# Patient Record
Sex: Male | Born: 1935
Health system: Southern US, Community
[De-identification: ages and names within clinical notes are randomized; demographics above are authoritative.]

## PROBLEM LIST (undated history)

## (undated) DIAGNOSIS — R55 Syncope and collapse: Secondary | ICD-10-CM

## (undated) DIAGNOSIS — E785 Hyperlipidemia, unspecified: Secondary | ICD-10-CM

## (undated) DIAGNOSIS — Z9289 Personal history of other medical treatment: Secondary | ICD-10-CM

## (undated) DIAGNOSIS — Z95810 Presence of automatic (implantable) cardiac defibrillator: Secondary | ICD-10-CM

## (undated) DIAGNOSIS — G4733 Obstructive sleep apnea (adult) (pediatric): Secondary | ICD-10-CM

## (undated) DIAGNOSIS — I5022 Chronic systolic (congestive) heart failure: Secondary | ICD-10-CM

## (undated) DIAGNOSIS — Z860100 Personal history of colon polyps, unspecified: Secondary | ICD-10-CM

## (undated) DIAGNOSIS — M1990 Unspecified osteoarthritis, unspecified site: Secondary | ICD-10-CM

## (undated) DIAGNOSIS — I499 Cardiac arrhythmia, unspecified: Secondary | ICD-10-CM

## (undated) DIAGNOSIS — I340 Nonrheumatic mitral (valve) insufficiency: Secondary | ICD-10-CM

## (undated) DIAGNOSIS — R200 Anesthesia of skin: Secondary | ICD-10-CM

## (undated) DIAGNOSIS — I4589 Other specified conduction disorders: Secondary | ICD-10-CM

## (undated) DIAGNOSIS — G43909 Migraine, unspecified, not intractable, without status migrainosus: Secondary | ICD-10-CM

## (undated) DIAGNOSIS — R202 Paresthesia of skin: Secondary | ICD-10-CM

## (undated) DIAGNOSIS — I219 Acute myocardial infarction, unspecified: Secondary | ICD-10-CM

## (undated) DIAGNOSIS — I2510 Atherosclerotic heart disease of native coronary artery without angina pectoris: Secondary | ICD-10-CM

## (undated) DIAGNOSIS — I10 Essential (primary) hypertension: Secondary | ICD-10-CM

## (undated) HISTORY — PX: CORONARY ANGIOPLASTY WITH STENT PLACEMENT: SHX49

## (undated) HISTORY — DX: Nonrheumatic mitral (valve) insufficiency: I34.0

## (undated) HISTORY — DX: Atherosclerotic heart disease of native coronary artery without angina pectoris: I25.10

## (undated) HISTORY — DX: Cardiac arrhythmia, unspecified: I49.9

## (undated) HISTORY — DX: Paresthesia of skin: R20.0

## (undated) HISTORY — DX: Chronic systolic (congestive) heart failure: I50.22

## (undated) HISTORY — DX: Personal history of colon polyps, unspecified: Z86.0100

## (undated) HISTORY — DX: Hyperlipidemia, unspecified: E78.5

## (undated) HISTORY — PX: CYSTOSCOPY: SHX5120

## (undated) HISTORY — DX: Essential (primary) hypertension: I10

## (undated) HISTORY — DX: Paresthesia of skin: R20.2

## (undated) HISTORY — PX: BACK SURGERY: SHX140

## (undated) HISTORY — PX: POSTERIOR LUMBAR FUSION: SHX6036

## (undated) HISTORY — DX: Syncope and collapse: R55

## (undated) HISTORY — PX: CATARACT EXTRACTION W/ INTRAOCULAR LENS  IMPLANT, BILATERAL: SHX1307

## (undated) HISTORY — PX: CARDIAC CATHETERIZATION: SHX172

## (undated) HISTORY — DX: Other specified conduction disorders: I45.89

## (undated) HISTORY — DX: Personal history of colonic polyps: Z86.010

## (undated) HISTORY — DX: Unspecified osteoarthritis, unspecified site: M19.90

## (undated) HISTORY — DX: Acute myocardial infarction, unspecified: I21.9

## (undated) HISTORY — DX: Obstructive sleep apnea (adult) (pediatric): G47.33

---
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
## 1967-09-16 DIAGNOSIS — Z9289 Personal history of other medical treatment: Secondary | ICD-10-CM

## 1967-09-16 HISTORY — DX: Personal history of other medical treatment: Z92.89

## 1990-09-15 HISTORY — PX: CORONARY ARTERY BYPASS GRAFT: SHX141

## 2000-08-25 ENCOUNTER — Ambulatory Visit (HOSPITAL_COMMUNITY): Admission: RE | Admit: 2000-08-25 | Discharge: 2000-08-25 | Payer: Self-pay | Admitting: Interventional Cardiology

## 2004-11-17 ENCOUNTER — Emergency Department (HOSPITAL_COMMUNITY): Admission: EM | Admit: 2004-11-17 | Discharge: 2004-11-17 | Payer: Self-pay | Admitting: Emergency Medicine

## 2004-12-25 ENCOUNTER — Ambulatory Visit: Payer: Self-pay | Admitting: Internal Medicine

## 2004-12-30 ENCOUNTER — Ambulatory Visit: Payer: Self-pay | Admitting: Internal Medicine

## 2004-12-31 ENCOUNTER — Ambulatory Visit: Payer: Self-pay | Admitting: Cardiology

## 2005-02-06 ENCOUNTER — Ambulatory Visit: Payer: Self-pay | Admitting: Internal Medicine

## 2005-02-13 ENCOUNTER — Encounter (INDEPENDENT_AMBULATORY_CARE_PROVIDER_SITE_OTHER): Payer: Self-pay | Admitting: Specialist

## 2005-02-13 ENCOUNTER — Ambulatory Visit: Payer: Self-pay | Admitting: Internal Medicine

## 2005-05-16 ENCOUNTER — Ambulatory Visit: Payer: Self-pay | Admitting: Cardiology

## 2005-05-21 ENCOUNTER — Ambulatory Visit: Payer: Self-pay | Admitting: Cardiology

## 2005-05-21 ENCOUNTER — Ambulatory Visit (HOSPITAL_COMMUNITY): Admission: RE | Admit: 2005-05-21 | Discharge: 2005-05-21 | Payer: Self-pay | Admitting: Cardiology

## 2005-06-18 ENCOUNTER — Ambulatory Visit: Payer: Self-pay | Admitting: Cardiology

## 2005-07-08 ENCOUNTER — Ambulatory Visit: Payer: Self-pay | Admitting: Internal Medicine

## 2005-07-25 ENCOUNTER — Ambulatory Visit: Payer: Self-pay | Admitting: Cardiology

## 2006-01-21 ENCOUNTER — Ambulatory Visit: Payer: Self-pay | Admitting: Cardiology

## 2006-02-12 ENCOUNTER — Ambulatory Visit: Payer: Self-pay | Admitting: Cardiology

## 2006-02-13 ENCOUNTER — Ambulatory Visit: Payer: Self-pay

## 2006-02-25 ENCOUNTER — Ambulatory Visit: Payer: Self-pay | Admitting: Cardiovascular Disease

## 2006-03-03 ENCOUNTER — Ambulatory Visit: Payer: Self-pay | Admitting: Cardiology

## 2006-07-28 ENCOUNTER — Ambulatory Visit: Payer: Self-pay | Admitting: Internal Medicine

## 2006-09-15 HISTORY — PX: CARDIAC DEFIBRILLATOR PLACEMENT: SHX171

## 2006-09-23 ENCOUNTER — Ambulatory Visit: Payer: Self-pay | Admitting: Cardiology

## 2006-09-23 ENCOUNTER — Ambulatory Visit: Payer: Self-pay

## 2006-12-30 ENCOUNTER — Ambulatory Visit: Payer: Self-pay | Admitting: Cardiology

## 2006-12-30 LAB — CONVERTED CEMR LAB
ALT: 21 units/L (ref 0–40)
Bilirubin, Direct: 0.1 mg/dL (ref 0.0–0.3)
Calcium: 9.3 mg/dL (ref 8.4–10.5)
GFR calc Af Amer: 69 mL/min
GFR calc non Af Amer: 57 mL/min
Glucose, Bld: 89 mg/dL (ref 70–99)
TSH: 2.56 microintl units/mL (ref 0.35–5.50)

## 2007-02-04 ENCOUNTER — Ambulatory Visit: Payer: Self-pay | Admitting: Cardiology

## 2007-02-12 ENCOUNTER — Ambulatory Visit: Payer: Self-pay | Admitting: Internal Medicine

## 2007-02-12 ENCOUNTER — Ambulatory Visit: Payer: Self-pay | Admitting: Cardiology

## 2007-02-12 LAB — CONVERTED CEMR LAB
Calcium: 9.5 mg/dL (ref 8.4–10.5)
Chloride: 106 meq/L (ref 96–112)
Digitoxin Lvl: 0.6 ng/mL — ABNORMAL LOW (ref 0.8–2.0)
GFR calc non Af Amer: 57 mL/min
Glucose, Bld: 94 mg/dL (ref 70–99)

## 2007-02-17 ENCOUNTER — Ambulatory Visit: Payer: Self-pay | Admitting: Cardiology

## 2007-02-23 ENCOUNTER — Ambulatory Visit: Payer: Self-pay | Admitting: Cardiology

## 2007-02-23 LAB — CONVERTED CEMR LAB
Basophils Relative: 0.1 % (ref 0.0–1.0)
CO2: 25 meq/L (ref 19–32)
Calcium: 9 mg/dL (ref 8.4–10.5)
Chloride: 101 meq/L (ref 96–112)
Eosinophils Relative: 2 % (ref 0.0–5.0)
GFR calc non Af Amer: 53 mL/min
Glucose, Bld: 185 mg/dL — ABNORMAL HIGH (ref 70–99)
Platelets: 184 10*3/uL (ref 150–400)
Prothrombin Time: 11.3 s (ref 10.0–14.0)
RBC: 4.11 M/uL — ABNORMAL LOW (ref 4.22–5.81)
WBC: 6.5 10*3/uL (ref 4.5–10.5)

## 2007-02-26 ENCOUNTER — Inpatient Hospital Stay (HOSPITAL_BASED_OUTPATIENT_CLINIC_OR_DEPARTMENT_OTHER): Admission: RE | Admit: 2007-02-26 | Discharge: 2007-02-26 | Payer: Self-pay | Admitting: Internal Medicine

## 2007-02-26 ENCOUNTER — Ambulatory Visit: Payer: Self-pay | Admitting: Internal Medicine

## 2007-03-02 ENCOUNTER — Ambulatory Visit: Payer: Self-pay | Admitting: Internal Medicine

## 2007-03-02 LAB — CONVERTED CEMR LAB
Basophils Relative: 0 % (ref 0.0–1.0)
CO2: 25 meq/L (ref 19–32)
Calcium: 9 mg/dL (ref 8.4–10.5)
Creatinine, Ser: 1.5 mg/dL (ref 0.4–1.5)
Eosinophils Relative: 2.5 % (ref 0.0–5.0)
GFR calc Af Amer: 59 mL/min
Glucose, Bld: 164 mg/dL — ABNORMAL HIGH (ref 70–99)
Hemoglobin: 12.2 g/dL — ABNORMAL LOW (ref 13.0–17.0)
Lymphocytes Relative: 16.3 % (ref 12.0–46.0)
Neutro Abs: 4.8 10*3/uL (ref 1.4–7.7)
Platelets: 209 10*3/uL (ref 150–400)
Prothrombin Time: 11.4 s (ref 10.0–14.0)
RDW: 14.2 % (ref 11.5–14.6)
WBC: 6.3 10*3/uL (ref 4.5–10.5)

## 2007-03-04 ENCOUNTER — Ambulatory Visit: Admission: RE | Admit: 2007-03-04 | Discharge: 2007-03-04 | Payer: Self-pay | Admitting: Internal Medicine

## 2007-03-10 ENCOUNTER — Ambulatory Visit: Payer: Self-pay | Admitting: Internal Medicine

## 2007-03-10 ENCOUNTER — Ambulatory Visit (HOSPITAL_COMMUNITY): Admission: RE | Admit: 2007-03-10 | Discharge: 2007-03-10 | Payer: Self-pay | Admitting: Internal Medicine

## 2007-03-16 DIAGNOSIS — Z95810 Presence of automatic (implantable) cardiac defibrillator: Secondary | ICD-10-CM

## 2007-03-24 ENCOUNTER — Ambulatory Visit: Payer: Self-pay | Admitting: Cardiology

## 2007-03-24 LAB — CONVERTED CEMR LAB
Basophils Relative: 0.1 % (ref 0.0–1.0)
CO2: 26 meq/L (ref 19–32)
Creatinine, Ser: 1.5 mg/dL (ref 0.4–1.5)
Eosinophils Relative: 2.7 % (ref 0.0–5.0)
GFR calc Af Amer: 59 mL/min
Glucose, Bld: 141 mg/dL — ABNORMAL HIGH (ref 70–99)
HCT: 35.3 % — ABNORMAL LOW (ref 39.0–52.0)
Hemoglobin: 12.3 g/dL — ABNORMAL LOW (ref 13.0–17.0)
Lymphocytes Relative: 17.3 % (ref 12.0–46.0)
Monocytes Absolute: 0.6 10*3/uL (ref 0.2–0.7)
Monocytes Relative: 8.7 % (ref 3.0–11.0)
Neutro Abs: 4.9 10*3/uL (ref 1.4–7.7)
Neutrophils Relative %: 71.2 % (ref 43.0–77.0)
Potassium: 4.4 meq/L (ref 3.5–5.1)
Prothrombin Time: 11.1 s (ref 10.0–14.0)
WBC: 6.9 10*3/uL (ref 4.5–10.5)

## 2007-03-26 ENCOUNTER — Ambulatory Visit (HOSPITAL_COMMUNITY): Admission: RE | Admit: 2007-03-26 | Discharge: 2007-03-27 | Payer: Self-pay | Admitting: Internal Medicine

## 2007-03-26 ENCOUNTER — Ambulatory Visit: Payer: Self-pay | Admitting: Internal Medicine

## 2007-04-14 ENCOUNTER — Ambulatory Visit: Payer: Self-pay

## 2007-05-03 DIAGNOSIS — Z860100 Personal history of colon polyps, unspecified: Secondary | ICD-10-CM | POA: Insufficient documentation

## 2007-05-03 DIAGNOSIS — I119 Hypertensive heart disease without heart failure: Secondary | ICD-10-CM

## 2007-05-03 DIAGNOSIS — E785 Hyperlipidemia, unspecified: Secondary | ICD-10-CM

## 2007-06-30 ENCOUNTER — Ambulatory Visit: Payer: Self-pay | Admitting: Internal Medicine

## 2007-06-30 LAB — CONVERTED CEMR LAB
Basophils Absolute: 0 10*3/uL (ref 0.0–0.1)
Calcium: 9.4 mg/dL (ref 8.4–10.5)
Chloride: 106 meq/L (ref 96–112)
Eosinophils Absolute: 0.1 10*3/uL (ref 0.0–0.6)
GFR calc Af Amer: 59 mL/min
GFR calc non Af Amer: 48 mL/min
Glucose, Bld: 106 mg/dL — ABNORMAL HIGH (ref 70–99)
Lymphocytes Relative: 18.4 % (ref 12.0–46.0)
MCHC: 33.8 g/dL (ref 30.0–36.0)
MCV: 87 fL (ref 78.0–100.0)
Neutro Abs: 5.2 10*3/uL (ref 1.4–7.7)
Neutrophils Relative %: 72.7 % (ref 43.0–77.0)
Platelets: 218 10*3/uL (ref 150–400)
RBC: 3.79 M/uL — ABNORMAL LOW (ref 4.22–5.81)
Sodium: 138 meq/L (ref 135–145)

## 2007-07-02 ENCOUNTER — Ambulatory Visit: Payer: Self-pay | Admitting: Cardiology

## 2007-07-06 ENCOUNTER — Ambulatory Visit: Payer: Self-pay | Admitting: Internal Medicine

## 2007-07-22 IMAGING — CR DG CHEST 2V
2 series · 2 of 2 positions shown · non-contrast
Comparison: 11/17/04.

CLINICAL DATA: Pre-ICD lead placement.
 CHEST - 2 VIEW:

[view not recorded (1 of 2)]
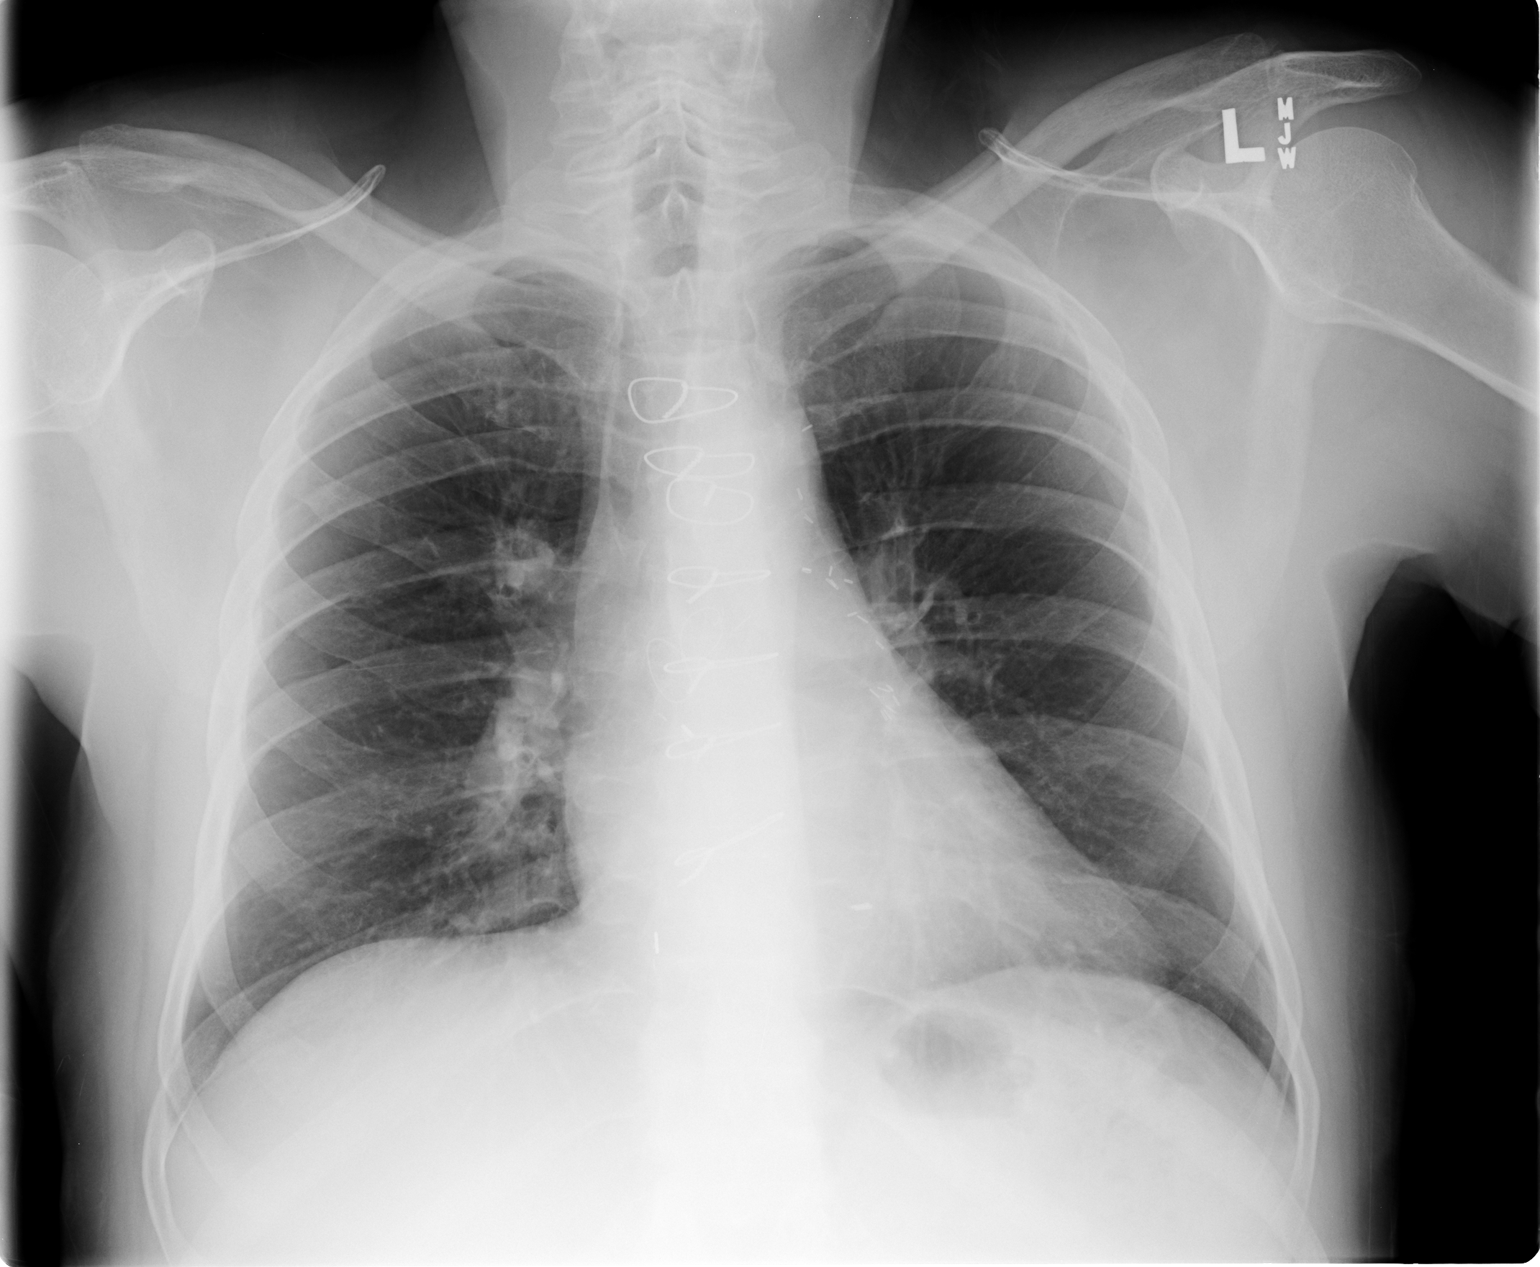

[view not recorded (2 of 2)]
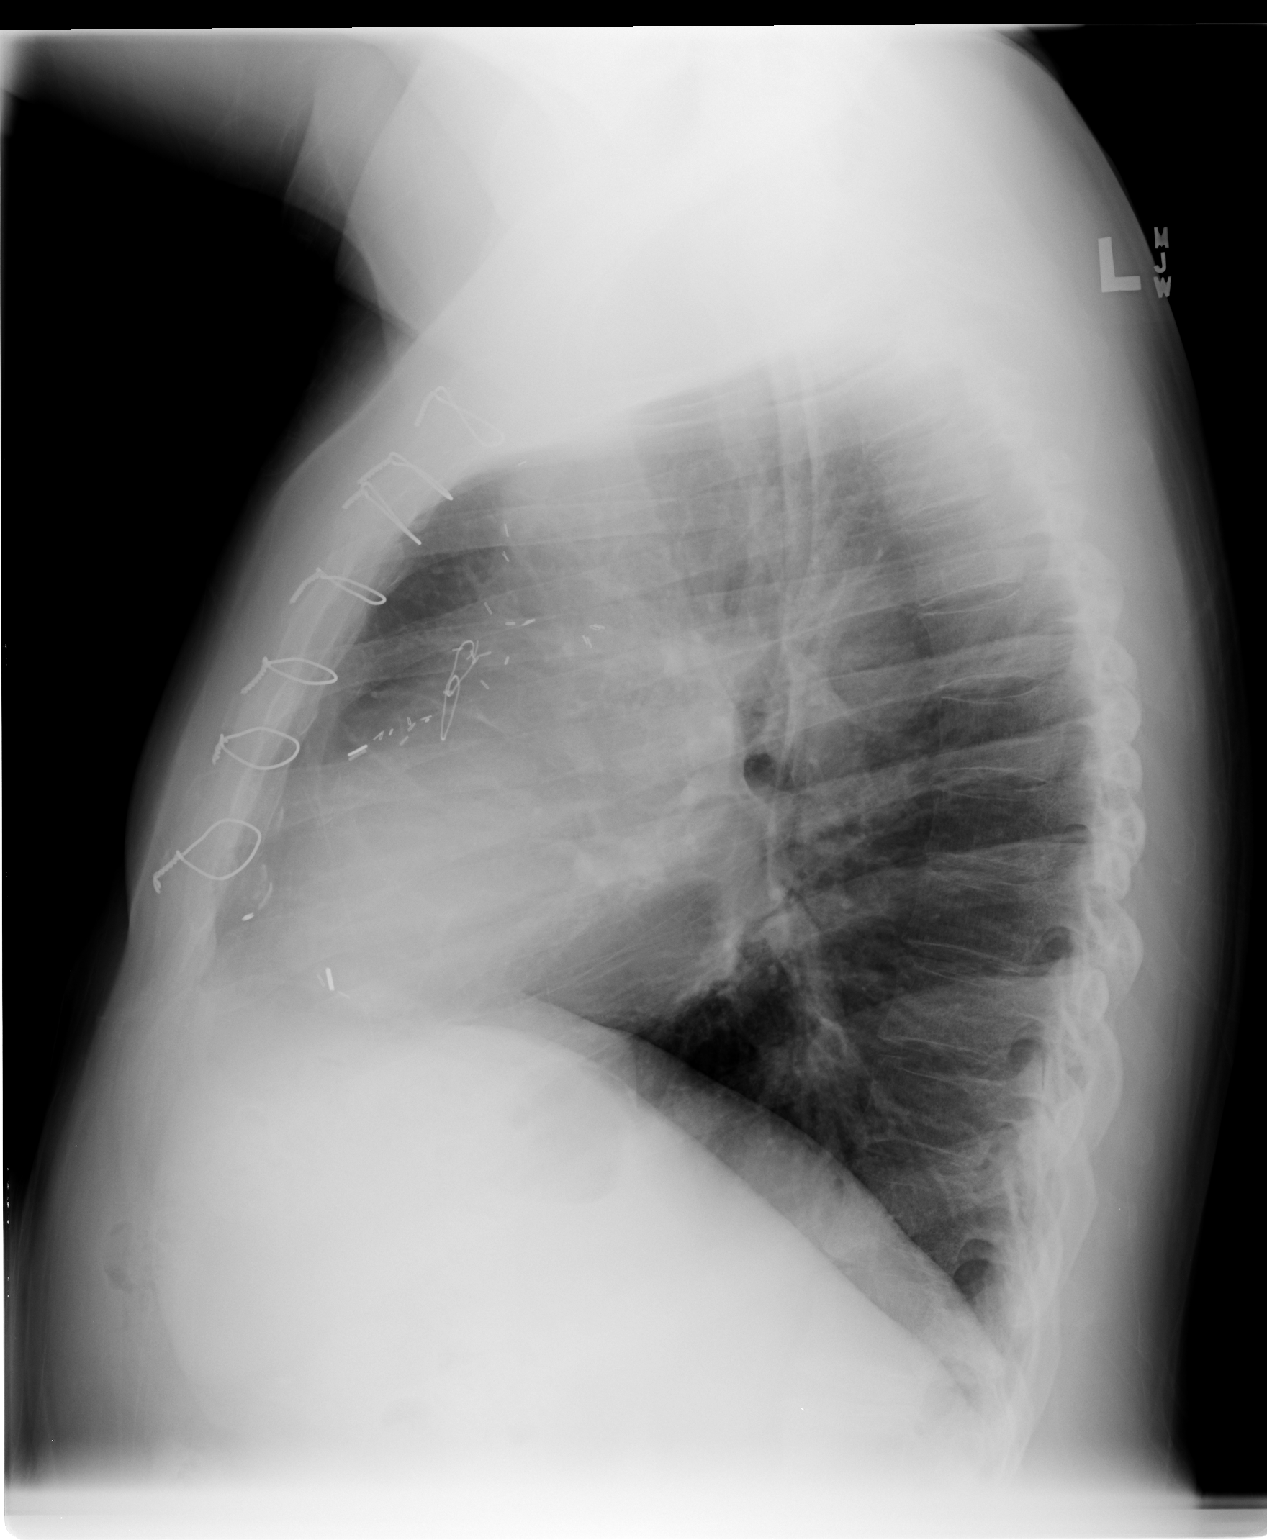

[2 of 2 positions shown; findings below may reference images not displayed]

FINDINGS: Two views of the chest show the lungs to be clear. There is mild peribronchial thickening present.  Mild cardiomegaly is stable. Median sternotomy sutures are noted.
IMPRESSION: No active lung disease. Borderline cardiomegaly. Mild peribronchial thickening.

## 2007-07-26 ENCOUNTER — Ambulatory Visit: Payer: Self-pay | Admitting: Internal Medicine

## 2007-08-19 ENCOUNTER — Ambulatory Visit: Payer: Self-pay | Admitting: Internal Medicine

## 2007-10-06 ENCOUNTER — Ambulatory Visit: Payer: Self-pay | Admitting: Cardiology

## 2007-10-18 ENCOUNTER — Ambulatory Visit: Payer: Self-pay | Admitting: Internal Medicine

## 2008-01-17 ENCOUNTER — Ambulatory Visit: Payer: Self-pay | Admitting: Internal Medicine

## 2008-02-14 ENCOUNTER — Ambulatory Visit: Payer: Self-pay | Admitting: Internal Medicine

## 2008-02-16 LAB — CONVERTED CEMR LAB
AST: 17 units/L (ref 0–37)
Albumin: 3.7 g/dL (ref 3.5–5.2)
BUN: 28 mg/dL — ABNORMAL HIGH (ref 6–23)
Basophils Absolute: 0 10*3/uL (ref 0.0–0.1)
Basophils Relative: 0.1 % (ref 0.0–1.0)
Calcium: 9.3 mg/dL (ref 8.4–10.5)
Chloride: 106 meq/L (ref 96–112)
Cholesterol: 131 mg/dL (ref 0–200)
Creatinine, Ser: 1.7 mg/dL — ABNORMAL HIGH (ref 0.4–1.5)
Direct LDL: 46.8 mg/dL
Eosinophils Absolute: 0.1 10*3/uL (ref 0.0–0.7)
Eosinophils Relative: 2 % (ref 0.0–5.0)
GFR calc Af Amer: 51 mL/min
GFR calc non Af Amer: 42 mL/min
HCT: 34.1 % — ABNORMAL LOW (ref 39.0–52.0)
Hemoglobin: 11.4 g/dL — ABNORMAL LOW (ref 13.0–17.0)
Ketones, ur: NEGATIVE mg/dL
MCHC: 33.6 g/dL (ref 30.0–36.0)
MCV: 88 fL (ref 78.0–100.0)
Monocytes Absolute: 0.5 10*3/uL (ref 0.1–1.0)
Neutro Abs: 5.3 10*3/uL (ref 1.4–7.7)
PSA: 1.42 ng/mL (ref 0.10–4.00)
RBC: 3.87 M/uL — ABNORMAL LOW (ref 4.22–5.81)
Specific Gravity, Urine: 1.01 (ref 1.000–1.03)
Total Bilirubin: 0.9 mg/dL (ref 0.3–1.2)
Total Protein, Urine: NEGATIVE mg/dL
Urine Glucose: NEGATIVE mg/dL
Urobilinogen, UA: 0.2 (ref 0.0–1.0)
VLDL: 60 mg/dL — ABNORMAL HIGH (ref 0–40)
WBC: 7.1 10*3/uL (ref 4.5–10.5)

## 2008-02-18 ENCOUNTER — Ambulatory Visit: Payer: Self-pay | Admitting: Internal Medicine

## 2008-02-18 DIAGNOSIS — D638 Anemia in other chronic diseases classified elsewhere: Secondary | ICD-10-CM

## 2008-03-16 ENCOUNTER — Ambulatory Visit: Payer: Self-pay | Admitting: Cardiology

## 2008-04-17 ENCOUNTER — Ambulatory Visit: Payer: Self-pay | Admitting: Internal Medicine

## 2008-05-24 ENCOUNTER — Ambulatory Visit: Payer: Self-pay | Admitting: Cardiology

## 2008-05-29 ENCOUNTER — Ambulatory Visit: Payer: Self-pay

## 2008-05-29 ENCOUNTER — Encounter: Payer: Self-pay | Admitting: Cardiology

## 2008-05-30 ENCOUNTER — Ambulatory Visit: Payer: Self-pay | Admitting: Cardiology

## 2008-06-13 ENCOUNTER — Ambulatory Visit: Payer: Self-pay | Admitting: Cardiology

## 2008-06-20 ENCOUNTER — Ambulatory Visit: Payer: Self-pay | Admitting: Internal Medicine

## 2008-07-06 ENCOUNTER — Ambulatory Visit: Payer: Self-pay | Admitting: Cardiology

## 2008-08-17 ENCOUNTER — Ambulatory Visit: Payer: Self-pay | Admitting: Internal Medicine

## 2008-08-17 LAB — CONVERTED CEMR LAB
Basophils Absolute: 0.1 10*3/uL (ref 0.0–0.1)
CO2: 30 meq/L (ref 19–32)
Chloride: 106 meq/L (ref 96–112)
Eosinophils Absolute: 0.2 10*3/uL (ref 0.0–0.7)
Eosinophils Relative: 2.9 % (ref 0.0–5.0)
GFR calc non Af Amer: 63 mL/min
Iron: 57 ug/dL (ref 42–165)
Lymphocytes Relative: 15.4 % (ref 12.0–46.0)
MCHC: 34.1 g/dL (ref 30.0–36.0)
MCV: 85.7 fL (ref 78.0–100.0)
Neutrophils Relative %: 73.6 % (ref 43.0–77.0)
Platelets: 191 10*3/uL (ref 150–400)
Potassium: 4.3 meq/L (ref 3.5–5.1)
WBC: 6.3 10*3/uL (ref 4.5–10.5)

## 2008-08-22 ENCOUNTER — Ambulatory Visit: Payer: Self-pay | Admitting: Internal Medicine

## 2008-09-18 ENCOUNTER — Ambulatory Visit: Payer: Self-pay | Admitting: Internal Medicine

## 2008-09-22 ENCOUNTER — Encounter: Payer: Self-pay | Admitting: Internal Medicine

## 2008-10-05 ENCOUNTER — Ambulatory Visit: Payer: Self-pay | Admitting: Cardiology

## 2008-12-18 ENCOUNTER — Ambulatory Visit: Payer: Self-pay | Admitting: Internal Medicine

## 2009-02-21 ENCOUNTER — Ambulatory Visit: Payer: Self-pay | Admitting: Internal Medicine

## 2009-02-21 LAB — CONVERTED CEMR LAB
Albumin: 3.7 g/dL (ref 3.5–5.2)
Alkaline Phosphatase: 87 units/L (ref 39–117)
Basophils Absolute: 0 10*3/uL (ref 0.0–0.1)
Bilirubin, Direct: 0.2 mg/dL (ref 0.0–0.3)
Chloride: 112 meq/L (ref 96–112)
Cholesterol: 127 mg/dL (ref 0–200)
Eosinophils Absolute: 0.3 10*3/uL (ref 0.0–0.7)
HDL: 29.7 mg/dL — ABNORMAL LOW (ref >39.00)
LDL Cholesterol: 63 mg/dL (ref 0–99)
Lymphocytes Relative: 19.3 % (ref 12.0–46.0)
MCHC: 33.9 g/dL (ref 30.0–36.0)
Neutro Abs: 3.5 10*3/uL (ref 1.4–7.7)
Neutrophils Relative %: 68.2 % (ref 43.0–77.0)
Platelets: 184 10*3/uL (ref 150.0–400.0)
Potassium: 4.7 meq/L (ref 3.5–5.1)
RDW: 14.2 % (ref 11.5–14.6)
Triglycerides: 172 mg/dL — ABNORMAL HIGH (ref 0.0–149.0)
VLDL: 34.4 mg/dL (ref 0.0–40.0)

## 2009-02-28 ENCOUNTER — Ambulatory Visit: Payer: Self-pay | Admitting: Internal Medicine

## 2009-02-28 DIAGNOSIS — R05 Cough: Secondary | ICD-10-CM

## 2009-03-19 ENCOUNTER — Ambulatory Visit: Payer: Self-pay | Admitting: Internal Medicine

## 2009-04-02 ENCOUNTER — Encounter: Payer: Self-pay | Admitting: Internal Medicine

## 2009-05-04 ENCOUNTER — Telehealth: Payer: Self-pay | Admitting: Cardiology

## 2009-06-26 ENCOUNTER — Ambulatory Visit: Payer: Self-pay | Admitting: Internal Medicine

## 2009-07-09 ENCOUNTER — Telehealth (INDEPENDENT_AMBULATORY_CARE_PROVIDER_SITE_OTHER): Payer: Self-pay | Admitting: *Deleted

## 2009-07-10 ENCOUNTER — Ambulatory Visit: Payer: Self-pay | Admitting: Cardiology

## 2009-07-10 ENCOUNTER — Ambulatory Visit: Payer: Self-pay

## 2009-07-10 ENCOUNTER — Encounter (HOSPITAL_COMMUNITY): Admission: RE | Admit: 2009-07-10 | Discharge: 2009-09-13 | Payer: Self-pay | Admitting: Internal Medicine

## 2009-07-17 ENCOUNTER — Telehealth: Payer: Self-pay | Admitting: Internal Medicine

## 2009-08-21 ENCOUNTER — Ambulatory Visit: Payer: Self-pay | Admitting: Internal Medicine

## 2009-08-21 LAB — CONVERTED CEMR LAB
ALT: 29 units/L (ref 0–53)
AST: 20 units/L (ref 0–37)
BUN: 13 mg/dL (ref 6–23)
Basophils Relative: 1.4 % (ref 0.0–3.0)
Chloride: 105 meq/L (ref 96–112)
Cholesterol: 132 mg/dL (ref 0–200)
Eosinophils Relative: 3.3 % (ref 0.0–5.0)
Hemoglobin: 13.3 g/dL (ref 13.0–17.0)
Lymphocytes Relative: 18.6 % (ref 12.0–46.0)
Monocytes Relative: 7.4 % (ref 3.0–12.0)
Neutro Abs: 4.1 10*3/uL (ref 1.4–7.7)
Potassium: 4.3 meq/L (ref 3.5–5.1)
RBC: 4.52 M/uL (ref 4.22–5.81)
TSH: 2.05 microintl units/mL (ref 0.35–5.50)
Total Bilirubin: 0.8 mg/dL (ref 0.3–1.2)
Total CHOL/HDL Ratio: 4

## 2009-08-27 ENCOUNTER — Ambulatory Visit: Payer: Self-pay | Admitting: Internal Medicine

## 2009-08-27 DIAGNOSIS — L723 Sebaceous cyst: Secondary | ICD-10-CM

## 2009-10-09 ENCOUNTER — Telehealth (INDEPENDENT_AMBULATORY_CARE_PROVIDER_SITE_OTHER): Payer: Self-pay | Admitting: *Deleted

## 2009-10-21 ENCOUNTER — Encounter: Payer: Self-pay | Admitting: Cardiology

## 2009-10-22 ENCOUNTER — Ambulatory Visit: Payer: Self-pay | Admitting: Cardiology

## 2009-10-22 ENCOUNTER — Ambulatory Visit: Payer: Self-pay | Admitting: Internal Medicine

## 2009-10-22 ENCOUNTER — Encounter: Payer: Self-pay | Admitting: Internal Medicine

## 2009-10-22 DIAGNOSIS — R209 Unspecified disturbances of skin sensation: Secondary | ICD-10-CM

## 2009-10-22 DIAGNOSIS — M79602 Pain in left arm: Secondary | ICD-10-CM | POA: Insufficient documentation

## 2009-10-22 DIAGNOSIS — Z87891 Personal history of nicotine dependence: Secondary | ICD-10-CM

## 2009-11-14 ENCOUNTER — Ambulatory Visit: Payer: Self-pay | Admitting: Internal Medicine

## 2009-11-14 LAB — CONVERTED CEMR LAB
CO2: 30 meq/L (ref 19–32)
Glucose, Bld: 132 mg/dL — ABNORMAL HIGH (ref 70–99)
HDL: 36.7 mg/dL — ABNORMAL LOW (ref >39.00)
Potassium: 4.1 meq/L (ref 3.5–5.1)
Sodium: 141 meq/L (ref 135–145)
TSH: 2.19 microintl units/mL (ref 0.35–5.50)
Total CK: 79 units/L (ref 7–232)
VLDL: 75.4 mg/dL — ABNORMAL HIGH (ref 0.0–40.0)

## 2009-11-19 ENCOUNTER — Ambulatory Visit: Payer: Self-pay | Admitting: Internal Medicine

## 2009-11-19 DIAGNOSIS — J209 Acute bronchitis, unspecified: Secondary | ICD-10-CM

## 2010-02-13 ENCOUNTER — Ambulatory Visit: Payer: Self-pay | Admitting: Internal Medicine

## 2010-02-13 LAB — CONVERTED CEMR LAB
AST: 20 units/L (ref 0–37)
Albumin: 3.9 g/dL (ref 3.5–5.2)
BUN: 18 mg/dL (ref 6–23)
CO2: 30 meq/L (ref 19–32)
Calcium: 9 mg/dL (ref 8.4–10.5)
Cholesterol: 127 mg/dL (ref 0–200)
GFR calc non Af Amer: 57.19 mL/min (ref >60)
Glucose, Bld: 99 mg/dL (ref 70–99)
Potassium: 4.6 meq/L (ref 3.5–5.1)
Total Protein: 6.8 g/dL (ref 6.0–8.3)
VLDL: 35.8 mg/dL (ref 0.0–40.0)

## 2010-02-19 ENCOUNTER — Ambulatory Visit: Payer: Self-pay | Admitting: Internal Medicine

## 2010-02-27 ENCOUNTER — Encounter (INDEPENDENT_AMBULATORY_CARE_PROVIDER_SITE_OTHER): Payer: Self-pay | Admitting: *Deleted

## 2010-04-25 ENCOUNTER — Ambulatory Visit: Payer: Self-pay | Admitting: Cardiology

## 2010-04-25 ENCOUNTER — Encounter: Payer: Self-pay | Admitting: Internal Medicine

## 2010-06-14 ENCOUNTER — Ambulatory Visit: Payer: Self-pay | Admitting: Internal Medicine

## 2010-06-14 LAB — CONVERTED CEMR LAB
CO2: 28 meq/L (ref 19–32)
Calcium: 8.6 mg/dL (ref 8.4–10.5)
Chloride: 103 meq/L (ref 96–112)
Glucose, Bld: 101 mg/dL — ABNORMAL HIGH (ref 70–99)
Sodium: 139 meq/L (ref 135–145)

## 2010-06-21 ENCOUNTER — Ambulatory Visit: Payer: Self-pay | Admitting: Internal Medicine

## 2010-07-16 ENCOUNTER — Ambulatory Visit: Payer: Self-pay | Admitting: Internal Medicine

## 2010-09-24 ENCOUNTER — Other Ambulatory Visit: Payer: Self-pay | Admitting: Internal Medicine

## 2010-09-24 ENCOUNTER — Ambulatory Visit
Admission: RE | Admit: 2010-09-24 | Discharge: 2010-09-24 | Payer: Self-pay | Source: Home / Self Care | Attending: Internal Medicine | Admitting: Internal Medicine

## 2010-09-24 LAB — CBC WITH DIFFERENTIAL/PLATELET
Basophils Absolute: 0 10*3/uL (ref 0.0–0.1)
Basophils Relative: 0.8 % (ref 0.0–3.0)
Eosinophils Absolute: 0.3 10*3/uL (ref 0.0–0.7)
Eosinophils Relative: 4.2 % (ref 0.0–5.0)
HCT: 38.5 % — ABNORMAL LOW (ref 39.0–52.0)
Hemoglobin: 13 g/dL (ref 13.0–17.0)
Lymphocytes Relative: 15.9 % (ref 12.0–46.0)
Lymphs Abs: 1 10*3/uL (ref 0.7–4.0)
MCHC: 33.7 g/dL (ref 30.0–36.0)
MCV: 86.8 fl (ref 78.0–100.0)
Monocytes Absolute: 0.4 10*3/uL (ref 0.1–1.0)
Monocytes Relative: 6.5 % (ref 3.0–12.0)
Neutro Abs: 4.7 10*3/uL (ref 1.4–7.7)
Neutrophils Relative %: 72.6 % (ref 43.0–77.0)
Platelets: 194 10*3/uL (ref 150.0–400.0)
RBC: 4.44 Mil/uL (ref 4.22–5.81)
RDW: 14.9 % — ABNORMAL HIGH (ref 11.5–14.6)
WBC: 6.4 10*3/uL (ref 4.5–10.5)

## 2010-09-24 LAB — URINALYSIS
Bilirubin Urine: NEGATIVE
Hemoglobin, Urine: NEGATIVE
Ketones, ur: NEGATIVE
Leukocytes, UA: NEGATIVE
Nitrite: NEGATIVE
Specific Gravity, Urine: 1.02 (ref 1.000–1.030)
Total Protein, Urine: NEGATIVE
Urine Glucose: NEGATIVE
Urobilinogen, UA: 0.2 (ref 0.0–1.0)
pH: 6 (ref 5.0–8.0)

## 2010-09-24 LAB — BASIC METABOLIC PANEL
BUN: 14 mg/dL (ref 6–23)
CO2: 28 mEq/L (ref 19–32)
Calcium: 9 mg/dL (ref 8.4–10.5)
Chloride: 106 mEq/L (ref 96–112)
Creatinine, Ser: 1 mg/dL (ref 0.4–1.5)
GFR: 73.21 mL/min (ref >60.00)
Glucose, Bld: 114 mg/dL — ABNORMAL HIGH (ref 70–99)
Potassium: 4.6 mEq/L (ref 3.5–5.1)
Sodium: 141 mEq/L (ref 135–145)

## 2010-09-24 LAB — HEPATIC FUNCTION PANEL
ALT: 23 U/L (ref 0–53)
AST: 17 U/L (ref 0–37)
Albumin: 3.6 g/dL (ref 3.5–5.2)
Alkaline Phosphatase: 82 U/L (ref 39–117)
Bilirubin, Direct: 0.1 mg/dL (ref 0.0–0.3)
Total Bilirubin: 0.6 mg/dL (ref 0.3–1.2)
Total Protein: 6.4 g/dL (ref 6.0–8.3)

## 2010-09-24 LAB — LIPID PANEL
Cholesterol: 135 mg/dL (ref 0–200)
HDL: 30.8 mg/dL — ABNORMAL LOW (ref >39.00)
LDL Cholesterol: 65 mg/dL (ref 0–99)
Total CHOL/HDL Ratio: 4
Triglycerides: 197 mg/dL — ABNORMAL HIGH (ref 0.0–149.0)
VLDL: 39.4 mg/dL (ref 0.0–40.0)

## 2010-09-24 LAB — PSA: PSA: 1.83 ng/mL (ref 0.10–4.00)

## 2010-09-24 LAB — TSH: TSH: 2.71 u[IU]/mL (ref 0.35–5.50)

## 2010-09-25 ENCOUNTER — Ambulatory Visit
Admission: RE | Admit: 2010-09-25 | Discharge: 2010-09-25 | Payer: Self-pay | Source: Home / Self Care | Attending: Internal Medicine | Admitting: Internal Medicine

## 2010-10-15 NOTE — Assessment & Plan Note (Signed)
Summary: 4 MO ROV /NWS  #   Vital Signs:  Patient profile:   77 year old male Height:      67 inches Weight:      157 pounds BMI:     24.68 Temp:     97.8 degrees F oral Pulse rate:   85 / minute Pulse rhythm:   regular BP sitting:   122 / 78  (left arm) Cuff size:   regular  Vitals Entered By: Lanier Prude, CMA(AAMA) (June 21, 2010 7:58 AM) CC: 4 mo f/u Is Patient Diabetic? No   Primary Care Provider:  Plotnikov  CC:  4 mo f/u.  History of Present Illness: The patient presents for a follow up of hypertension, CAD, hyperlipidemia   Preventive Screening-Counseling & Management  Alcohol-Tobacco     Smoking Status: quit  Current Medications (verified): 1)  Lipitor 40 Mg  Tabs (Atorvastatin Calcium) .... Once Daily 2)  Carvedilol 25 Mg  Tabs (Carvedilol) .... Two Times A Day 3)  Digitek 0.125 Mg Tabs (Digoxin) .Marland Kitchen.. 1 By Mouth Qd 4)  Vitamin D3 1000 Unit  Tabs (Cholecalciferol) .Marland Kitchen.. 1 Qd 5)  Aspirin 325 Mg Tabs (Aspirin) .Marland Kitchen.. 1 Qd 6)  Nitrolingual Duo Pack 0.4 Mg/spray Tl Soln (Nitroglycerin) .... Use Prn 7)  Ramipril 2.5 Mg Caps (Ramipril) .... Take One Capsule By Mouth Daily  Allergies (verified): 1)  ! Morphine 2)  Ramipril (Ramipril)  Past History:  Past Medical History: Last updated: 04/25/2010 Colonic polyps, hx of Hyperlipidemia Hypertension CAD.Marland Kitchen.(hx apical aneurysm).Marland Kitchen  /  cath..9/ 2006..old occluded graft to RCA....no change  /   cath...6/ 2008...no change..  /    ..myoview...10/ 2010...old scar...no ischemia...EF 43% EF 35-40%...echo...9/ 2009...akinesis periapical wall CABG...1992 ICD   7/ 2008 Chronotropic imcompetence....treated with pacemaker (ICD  03/2007)  (This was placed after CPX done post 2008 cath. CPX showed only chronotropic incompetence) Arrhythmia Congestive heart failure Syncope / dizziness...relative hypotension Mitral Regurg...mild Tingling in the left arm.... February, 2011 /   improved after treatment with  prednisone spironolactone.... mild dizziness while he was taking.. 2010.Marland Kitchen..Marland Kitchennot clear if related to medicine... retrial can be considered in the future  Past Surgical History: Last updated: 08/19/2007 Coronary artery bypass graft 1992 Cardiac Cath. Implanted Defibrillator 2008  Family History: Last updated: 08/19/2007 Family History of Colon CA 1st degree relative <60  S Family History Hypertension  Social History: Retired Married wife has met Ca 8/11 Former Smoker  Review of Systems  The patient denies weight loss, chest pain, prolonged cough, and abdominal pain.    Physical Exam  General:  The patient was alert and oriented in no acute distress. Neck veins were flat, carotids were brisk.  Lungs were clear.  Heart sounds were regular without murmurs or gallops.  Abdomen was soft with active bowel sounds. There is no clubbing cyanosis or edema. Skin Warm and dry  Psych:  Oriented X3 and normally interactive.     Impression & Recommendations:  Problem # 1:  CORONARY ARTERY DISEASE (ICD-414.00) Assessment Unchanged  His updated medication list for this problem includes:    Carvedilol 25 Mg Tabs (Carvedilol) .Marland Kitchen..Marland Kitchen Two times a day    Aspirin 325 Mg Tabs (Aspirin) .Marland Kitchen... 1 qd    Nitrolingual Duo Pack 0.4 Mg/spray Tl Soln (Nitroglycerin) ..... Use prn    Ramipril 2.5 Mg Caps (Ramipril) .Marland Kitchen... Take one capsule by mouth daily  Problem # 2:  HYPERLIPIDEMIA (ICD-272.4)  His updated medication list for this problem includes:    Lipitor  40 Mg Tabs (Atorvastatin calcium) ..... Once daily  Problem # 3:  CONGESTIVE HEART FAILURE (ICD-428.0)  His updated medication list for this problem includes:    Carvedilol 25 Mg Tabs (Carvedilol) .Marland Kitchen..Marland Kitchen Two times a day    Digitek 0.125 Mg Tabs (Digoxin) .Marland Kitchen... 1 by mouth qd    Aspirin 325 Mg Tabs (Aspirin) .Marland Kitchen... 1 qd    Ramipril 2.5 Mg Caps (Ramipril) .Marland Kitchen... Take one capsule by mouth daily  Problem # 4:  HYPERTENSION  (ICD-401.9) Assessment: Unchanged  His updated medication list for this problem includes:    Carvedilol 25 Mg Tabs (Carvedilol) .Marland Kitchen..Marland Kitchen Two times a day    Ramipril 2.5 Mg Caps (Ramipril) .Marland Kitchen... Take one capsule by mouth daily The labs were reviewed with the patient.  BP today: 122/78 Prior BP: 122/58 (04/25/2010)  Labs Reviewed: K+: 4.2 (06/14/2010) Creat: : 1.2 (06/14/2010)   Chol: 127 (02/13/2010)   HDL: 31.40 (02/13/2010)   LDL: 60 (02/13/2010)   TG: 179.0 (02/13/2010)  Complete Medication List: 1)  Lipitor 40 Mg Tabs (Atorvastatin calcium) .... Once daily 2)  Carvedilol 25 Mg Tabs (Carvedilol) .... Two times a day 3)  Digitek 0.125 Mg Tabs (Digoxin) .Marland Kitchen.. 1 by mouth qd 4)  Vitamin D3 1000 Unit Tabs (Cholecalciferol) .Marland Kitchen.. 1 qd 5)  Aspirin 325 Mg Tabs (Aspirin) .Marland Kitchen.. 1 qd 6)  Nitrolingual Duo Pack 0.4 Mg/spray Tl Soln (Nitroglycerin) .... Use prn 7)  Ramipril 2.5 Mg Caps (Ramipril) .... Take one capsule by mouth daily  Contraindications/Deferment of Procedures/Staging:    Test/Procedure: FLU VAX    Reason for deferment: patient declined   Patient Instructions: 1)  Please schedule a follow-up appointment in 3 months well w/labs.   Not Administered:    Influenza Vaccine not given due to: declined

## 2010-10-15 NOTE — Assessment & Plan Note (Signed)
Summary: DEVICE/SAF      Allergies Added:   Visit Type:  ICD [employer] Primary Provider:  Plotnikov  CC:  no complaints.  History of Present Illness: [name] [name] is seen in followup for an ICD implanted for primary prevention in the setting of ischemic heart disease. He has been very heavily burdened by caring for his wife who had dementia and a Parkinson's . She died a couple weeks ago. This followed a death of his middle son a couple of months before that. He is making a one step at a time   .   There is no chest pain and no shortness of breath.  There is no syncope or presyncope.  He has known left ventricular dysfunction.     Problems Prior to Update: 1)  Bronchitis, Acute  (ICD-466.0) 2)  Paresthesia  (ICD-782.0) 3)  Arm Pain  (ICD-729.5) 4)  Tobacco Use, Quit  (ICD-V15.82) 5)  Tingling in The Left Arm February, 2011  () 6)  Syncope  (ICD-780.2) 7)  Chronotropic Incompetence  () 8)  Icd  () 9)  Coronary Artery Bypass Graft, Hx of  (ICD-V45.81) 10)  Coronary Artery Disease  (ICD-414.00) 11)  Sebaceous Cyst  (ICD-706.2) 12)  Chest Pain  (ICD-786.50) 13)  Cough  (ICD-786.2) 14)  Anemia of Other Chronic Disease  (ICD-285.29) 15)  Implantable Defibrillator St J  (ICD-V45.02) 16)  Well Adult Exam  (ICD-V70.0) 17)  Congestive Heart Failure  (ICD-428.0) 18)  Cardiomyopathy, Ischemic S/p Cabg  (ICD-414.8) 19)  Hypertension  (ICD-401.9) 20)  Hyperlipidemia  (ICD-272.4) 21)  Colonic Polyps, Hx of  (ICD-V12.72) 22)  Family History of Colon Ca 1st Degree Relative <60  (ICD-V16.0)  Current Medications (verified): 1)  Lipitor 40 Mg  Tabs (Atorvastatin Calcium) .... Once Daily 2)  Carvedilol 25 Mg  Tabs (Carvedilol) .... Two Times A Day 3)  Digitek 0.125 Mg Tabs (Digoxin) .Marland Kitchen.. 1 By Mouth Qd 4)  Vitamin D3 1000 Unit  Tabs (Cholecalciferol) .Marland Kitchen.. 1 Qd 5)  Aspirin 325 Mg Tabs (Aspirin) .Marland Kitchen.. 1 Qd 6)  Nitrolingual Duo Pack 0.4 Mg/spray Tl Soln (Nitroglycerin) .... Use Prn 7)  Ramipril  2.5 Mg Caps (Ramipril) .... Take One Capsule By Mouth Daily  Allergies (verified): 1)  ! Morphine 2)  Ramipril (Ramipril)  Past History:  Past Medical History: Last updated: 04/25/2010 Colonic polyps, hx of Hyperlipidemia Hypertension CAD.Marland Kitchen.(hx apical aneurysm).Marland Kitchen  /  cath..9/ 2006..old occluded graft to RCA....no change  /   cath...6/ 2008...no change..  /    ..myoview...10/ 2010...old scar...no ischemia...EF 43% EF 35-40%...echo...9/ 2009...akinesis periapical wall CABG...1992 ICD   7/ 2008 Chronotropic imcompetence....treated with pacemaker (ICD  03/2007)  (This was placed after CPX done post 2008 cath. CPX showed only chronotropic incompetence) Arrhythmia Congestive heart failure Syncope / dizziness...relative hypotension Mitral Regurg...mild Tingling in the left arm.... February, 2011 /   improved after treatment with prednisone spironolactone.... mild dizziness while he was taking.. 2010.Marland Kitchen..Marland Kitchennot clear if related to medicine... retrial can be considered in the future  Past Surgical History: Last updated: 08/19/2007 Coronary artery bypass graft 1992 Cardiac Cath. Implanted Defibrillator 2008  Family History: Last updated: 08/19/2007 Family History of Colon CA 1st degree relative <60  S Family History Hypertension  Social History: Last updated: 06/21/2010 Retired Married wife has met Ca 8/11 Former Smoker  Risk Factors: Smoking Status: quit (06/21/2010)  Vital Signs:  Patient profile:   77 year old male Height:      67 inches Weight:  159.50 pounds BMI:     25.07 Pulse rate:   70 / minute BP sitting:   156 / 86  (left arm) Cuff size:   regular  Vitals Entered By: Caralee Ates CMA (July 16, 2010 9:43 AM)  Physical Exam  General:  The patient was alert and oriented in no acute distress. HEENT Normal.  Neck veins were flat, carotids were brisk.  Lungs were clear.  Heart sounds were regular without murmurs or gallops.  Abdomen was soft with active  bowel sounds. There is no clubbing cyanosis or edema. Skin Warm and dry     ICD Specifications Following MD:  Sherryl Manges, MD     Referring MD:  KATZ ICD Vendor:  [employer] Jude     ICD Model Number:  [phone]     ICD Serial Number:  [id number] ICD DOI:  03/26/2007     ICD Implanting MD:  Sherryl Manges, MD  Lead 1:    Location: [facility]     DOI: 03/26/2007     Model #: [id number]     Serial #: [id number]     Status: active Lead 2:    Location: RV     DOI: 03/26/2007     Model #: [id number]     Serial #: [id number]     Status: active  ICD Follow Up Battery Voltage:  2.95 V     Charge Time:  12.1 seconds     Battery Est. Longevity:  4.1-4.4 YRS Underlying rhythm:  SB @ 34 ICD Dependent:  No       ICD Device Measurements Atrium:  Amplitude: 2.2 mV, Impedance: 530 ohms, Threshold: 1.0 V at 0.4 msec Right Ventricle:  Amplitude: 11.6 mV, Impedance: 450 ohms, Threshold: 0.75 V at 0.4 msec Shock Impedance: 68 ohms   Episodes MS Episodes:  0     Coumadin:  No Shock:  0     ATP:  0     Nonsustained:  0     Atrial Therapies:  0 Atrial Pacing:  98%     Ventricular Pacing:  <1%  Brady Parameters Mode DDDR     Lower Rate Limit:  70     Upper Rate Limit 120 PAV 150     Sensed AV Delay:  150  Tachy Zones VF:  240     VT:  200     VT1:  176     Next Remote Date:  10/17/2010     Next Cardiology Appt Due:  07/17/2011 Tech Comments:  NORMAL DEVICE FUNCTION.  NO EPISODES SINCE LAST CHECK.  NO CHANGES MADE. MERLIN 10-17-10 AND ROV IN 12 MTHS W/SK. Vella Kohler  July 16, 2010 9:52 AM  Impression & Recommendations:  Problem # 1:  SYNCOPE (ICD-780.2) no recurrent syncope His updated medication list for this problem includes:    Carvedilol 25 Mg Tabs (Carvedilol) .Marland Kitchen..Marland Kitchen Two times a day    Aspirin 325 Mg Tabs (Aspirin) .Marland Kitchen... 1 qd    Nitrolingual Duo Pack 0.4 Mg/spray Tl Soln (Nitroglycerin) ..... Use prn    Ramipril 2.5 Mg Caps (Ramipril) .Marland Kitchen... Take one capsule by mouth daily  Problem # 2:  CORONARY ARTERY DISEASE  (ICD-414.00) stable His updated medication list for this problem includes:    Carvedilol 25 Mg Tabs (Carvedilol) .Marland Kitchen..Marland Kitchen Two times a day    Aspirin 325 Mg Tabs (Aspirin) .Marland Kitchen... 1 qd    Nitrolingual Duo Pack 0.4 Mg/spray Tl Soln (Nitroglycerin) ..... Use prn    Ramipril 2.5  Mg Caps (Ramipril) .Marland Kitchen... Take one capsule by mouth daily  Problem # 3:  AUTOMATIC IMPLANTABLE CARDIAC DEFIBRILLATOR SITU (ICD-V45.02) Device parameters and data were reviewed and no changes were made  Patient Instructions: 1)  Your physician recommends that you continue on your current medications as directed. Please refer to the Current Medication list given to you today. 2)  Your physician wants you to follow-up in: 1 YEAR  You will receive a reminder letter in the mail two months in advance. If you don't receive a letter, please call our office to schedule the follow-up appointment.

## 2010-10-15 NOTE — Letter (Signed)
Summary: Colonoscopy Letter   Gastroenterology  [address] [place], [place] [address]   Phone: [phone]  Fax: [phone]      February 27, 2010 MRN: [id number]   [name] [name] [address] [place], [place]  [address]   Dear [name] [name],   According to your medical record, it is time for you to schedule a Colonoscopy. The American Cancer Society recommends this procedure as a method to detect early colon cancer. Patients with a family history of colon cancer, or a personal history of colon polyps or inflammatory bowel disease are at increased risk.  This letter has been generated based on the recommendations made at the time of your procedure. If you feel that in your particular situation this may no longer apply, please contact our office.  Please call our office at [phone] to schedule this appointment or to update your records at your earliest convenience.  Thank you for cooperating with [place] to provide you with the very best care possible.   Sincerely,  Wilhemina Bonito. Marina Goodell, M.D.  [facility]astro[facility]ology [facility] [phone]

## 2010-10-15 NOTE — Miscellaneous (Signed)
  Clinical Lists Changes  Problems: Added new problem of CORONARY ARTERY BYPASS GRAFT, HX OF (ICD-V45.81) Added new problem of * ICD Added new problem of * CHRONOTROPIC INCOMPETENCE Added new problem of SYNCOPE (ICD-780.2) Observations: Added new observation of PAST MED HX: Colonic polyps, hx of Hyperlipidemia Hypertension CAD.Marland Kitchen.(hx apical aneurysm).Marland Kitchen  /  cath..9/ 2006..old occluded graft to RCA....no change  /   cath...6/ 2008...no change..  /    ..myoview...10/ 2010...old scar...no ischemia...EF 43% EF 35-40%...echo...9/ 2009...akinesis periapical wall CABG...1992 ICD   7/ 2008 Chronotropic imcompetence....treated with pacemaker (ICD  03/2007)  (This was placed after CPX done post 2008 cath. CPX showed only chronotropic incompetence) Arrhythmia Congestive heart failure Syncope / dizziness...relative hypotension Mitral Regurg...mild   (10/21/2009 11:07)       Past History:  Past Medical History: Colonic polyps, hx of Hyperlipidemia Hypertension CAD.Marland Kitchen.(hx apical aneurysm).Marland Kitchen  /  cath..9/ 2006..old occluded graft to RCA....no change  /   cath...6/ 2008...no change..  /    ..myoview...10/ 2010...old scar...no ischemia...EF 43% EF 35-40%...echo...9/ 2009...akinesis periapical wall CABG...1992 ICD   7/ 2008 Chronotropic imcompetence....treated with pacemaker (ICD  03/2007)  (This was placed after CPX done post 2008 cath. CPX showed only chronotropic incompetence) Arrhythmia Congestive heart failure Syncope / dizziness...relative hypotension Mitral Regurg...mild

## 2010-10-15 NOTE — Assessment & Plan Note (Signed)
Summary: f1y/per pt call/jss      Allergies Added:   Visit Type:  Follow-up Primary Provider:  Plotnikov  CC:  CAD.  History of Present Illness: [name] [name] is seen for followup of coronary artery disease.  It turns out he had one episode of some chest discomfort since his last visit.  It occurred last night.  He took one nitroglycerin spray and it resolved.  He has had another symptom recently that is somewhat different.  He has felt some discomfort in the area of his left tricep.  He then gets some tingling in his arm.  This is not associated with chest pain.  The [name] [name] had a stress Myoview scan in October, 2010.  There was no ischemia at that time.  The [name] [name] does not feel the sensation with exertion.  Current Medications (verified): 1)  Lipitor 40 Mg  Tabs (Atorvastatin Calcium) .... Once Daily 2)  Carvedilol 25 Mg  Tabs (Carvedilol) .... Two Times A Day 3)  Digitek 0.125 Mg Tabs (Digoxin) .Marland Kitchen.. 1 By Mouth Qd 4)  Vitamin D3 1000 Unit  Tabs (Cholecalciferol) .Marland Kitchen.. 1 Qd 5)  Aspirin 325 Mg Tabs (Aspirin) .Marland Kitchen.. 1 Qd 6)  Nitrolingual Duo Pack 0.4 Mg/spray Tl Soln (Nitroglycerin) .... Use Prn 7)  Ramipril 2.5 Mg Caps (Ramipril) .... Take One Capsule By Mouth Daily  Allergies (verified): 1)  ! Morphine 2)  Ramipril (Ramipril)  Past History:  Past Medical History: Colonic polyps, hx of Hyperlipidemia Hypertension CAD.Marland Kitchen.(hx apical aneurysm).Marland Kitchen  /  cath..9/ 2006..old occluded graft to RCA....no change  /   cath...6/ 2008...no change..  /    ..myoview...10/ 2010...old scar...no ischemia...EF 43% EF 35-40%...echo...9/ 2009...akinesis periapical wall CABG...1992 ICD   7/ 2008 Chronotropic imcompetence....treated with pacemaker (ICD  03/2007)  (This was placed after CPX done post 2008 cath. CPX showed only chronotropic incompetence) Arrhythmia Congestive heart failure Syncope / dizziness...relative hypotension Mitral Regurg...mild Tingling in the left arm.... February, 2011  Review of  Systems       [name] [name] denies fever, chills, headache, sweats, rash, change in vision, change in hearing, shortness of breath, cough, nausea vomiting, urinary symptoms.  All other systems are reviewed and are negative other than history of present illness.  Vital Signs:  [name] [name] profile:   77 year old male Height:      67 inches Weight:      171 pounds BMI:     26.88 Pulse rate:   70 / minute BP sitting:   142 / 64  (left arm) Cuff size:   regular  Vitals Entered By: Hardin Negus, RMA (October 22, 2009 10:51 AM)  Physical Exam  General:  [name] [name] is stable today. Head:  head is atraumatic. Eyes:  no xanthelasma. Neck:  no jugular venous distention. Chest Wall:  no chest wall tenderness. Lungs:  lungs are clear.  Respiratory effort is nonlabored. Heart:  cardiac exam reveals S1 and S2.  No clicks or significant murmurs. Abdomen:  abdomen is soft. Msk:  no musculoskeletal deformities. Extremities:  the left arm reveals no masses in the area of the triceps.  There is no obvious abnormality in the arm. Skin:  no skin rashes. Psych:  [name] [name] is oriented to person time and place.  Affect is normal.    ICD Specifications Following MD:  Sherryl Manges, MD     ICD Vendor:  [facility] Jude     ICD Model Number:  [id number]     ICD Serial Number:  [id number] ICD DOI:  03/26/2007  ICD Implanting MD:  Sherryl Manges, MD  Lead 1:    Location: [facility]     DOI: 03/26/2007     Model #: [id number]     Serial #: [id number]     Status: active Lead 2:    Location: RV     DOI: 03/26/2007     Model #: [id number]     Serial #: [id number]     Status: active  ICD Follow Up ICD Dependent:  No      Episodes Coumadin:  No  Brady Parameters Mode DDDR     Lower Rate Limit:  70     Upper Rate Limit 120 PAV 150     Sensed AV Delay:  150  Tachy Zones VF:  240     VT:  200     VT1:  176     Impression & Recommendations:  Problem # 1:  * TINGLING IN THE LEFT ARM FEBRUARY, 2011 [name] [name] has tingling in his left triceps and left  arm.  I believe this is not an ischemic symptom for him.  I have asked him to see his primary physician and we will help arrange for an earlier visit.  He did have a Myoview scan in October 2010 with no ischemia.  This is a new problem for him.  Problem # 2:  * CHRONOTROPIC INCOMPETENCE chronotropic incompetence is no longer a problem as the [name] [name] has a pacemaker.  Problem # 3:  CORONARY ARTERY DISEASE (ICD-414.00)  His updated medication list for this problem includes:    Carvedilol 25 Mg Tabs (Carvedilol) .Marland Kitchen..Marland Kitchen Two times a day    Aspirin 325 Mg Tabs (Aspirin) .Marland Kitchen... 1 qd    Nitrolingual Duo Pack 0.4 Mg/spray Tl Soln (Nitroglycerin) ..... Use prn    Ramipril 2.5 Mg Caps (Ramipril) .Marland Kitchen... Take one capsule by mouth daily [name] [name]'s coronary disease is stable.  He has had one episode of chest discomfort.  Further workup is not needed at this time.  Problem # 4:  * ICD The [name] [name]'s ICD was interrogated today.  I have reviewed the data with the technical pain.  His ICD is working well and no changes are made.  Problem # 5:  HYPERTENSION (ICD-401.9)  His updated medication list for this problem includes:    Carvedilol 25 Mg Tabs (Carvedilol) .Marland Kitchen..Marland Kitchen Two times a day    Aspirin 325 Mg Tabs (Aspirin) .Marland Kitchen... 1 qd    Ramipril 2.5 Mg Caps (Ramipril) .Marland Kitchen... Take one capsule by mouth daily Blood pressure is under good control.  No change in therapy.  [name] [name] Instructions: 1)  We have scheduled you an appointment with Dr Posey Rea today at 3:00 2)  Follow up with Dr Myrtis Ser in 6 months Prescriptions: RAMIPRIL 2.5 MG CAPS (RAMIPRIL) Take one capsule by mouth daily  #90 x 3   Entered by:   Meredith Staggers, RN   Authorized by:   Talitha Givens, MD, [facility]   Signed by:   Meredith Staggers, RN on 10/22/2009   Method used:   Faxed to ...       [facility] Environmental education officer)       [address] [address]       [place], [place]  [address]       Ph: [phone]       Fax: [phone]   RxID:   [id number] DIGITEK 0.125 MG  TABS (DIGOXIN) 1 by mouth qd  #90 x 3   Entered by:   Meredith Staggers, RN   Authorized  by:   Talitha Givens, MD, [facility]   Signed by:   Meredith Staggers, RN on 10/22/2009   Method used:   Faxed to ...       [facility] Environmental education officer)       [address] [address]       [place], [place]  [address]       Ph: [phone]       Fax: [phone]   RxID:   [id number] CARVEDILOL 25 MG  TABS (CARVEDILOL) two times a day  #180 x 3   Entered by:   Meredith Staggers, RN   Authorized by:   Talitha Givens, MD, [facility]   Signed by:   Meredith Staggers, RN on 10/22/2009   Method used:   Faxed to ...       [facility] Environmental education officer)       [address] [address]       [place], [place]  [address]       Ph: [phone]       Fax: [phone]   RxID:   [id number] LIPITOR 40 MG  TABS (ATORVASTATIN CALCIUM) once daily  #90 x 3   Entered by:   Meredith Staggers, RN   Authorized by:   Talitha Givens, MD, [facility]   Signed by:   Meredith Staggers, RN on 10/22/2009   Method used:   Faxed to ...       [facility] Environmental education officer)       [address] [address]       [place], [place]  [address]       Ph: [phone]       Fax: [phone]   RxID:   [id number]

## 2010-10-15 NOTE — Cardiovascular Report (Signed)
Summary: Office Visit   Office Visit   Imported By: Roderic Ovens 11/06/2009 11:52:19  _____________________________________________________________________  External Attachment:    Type:   Image     Comment:   External Document

## 2010-10-15 NOTE — Assessment & Plan Note (Signed)
Summary: one month follow up-lb   Vital Signs:  Patient profile:   77 year old male Weight:      174 pounds Temp:     98.6 degrees F oral Pulse rate:   76 / minute BP sitting:   122 / 54  (left arm)  Vitals Entered By: Tora Perches (November 19, 2009 8:20 AM) CC: f/u Is Patient Diabetic? No   Primary Care Provider:  Plotnikov  CC:  f/u.  History of Present Illness: The patient presents for a follow up of neck and L arm pain w/tingling - better after Rx... The patient presents with complaints of sore throat, fever, cough, sinus congestion and drainge of several days duration. Not better with OTC meds. Chest hurts with coughing. Can't sleep due to cough. Muscle aches are present.  The mucus is colored.    Preventive Screening-Counseling & Management  Alcohol-Tobacco     Smoking Status: quit  Current Medications (verified): 1)  Lipitor 40 Mg  Tabs (Atorvastatin Calcium) .... Once Daily 2)  Carvedilol 25 Mg  Tabs (Carvedilol) .... Two Times A Day 3)  Digitek 0.125 Mg Tabs (Digoxin) .Marland Kitchen.. 1 By Mouth Qd 4)  Vitamin D3 1000 Unit  Tabs (Cholecalciferol) .Marland Kitchen.. 1 Qd 5)  Aspirin 325 Mg Tabs (Aspirin) .Marland Kitchen.. 1 Qd 6)  Nitrolingual Duo Pack 0.4 Mg/spray Tl Soln (Nitroglycerin) .... Use Prn 7)  Ramipril 2.5 Mg Caps (Ramipril) .... Take One Capsule By Mouth Daily 8)  Prednisone 10 Mg Tabs (Prednisone) .... Take 40mg  Qd For 3 Days, Then 20 Mg Qd For 3 Days, Then 10mg  Qd For 6 Days, Then Stop. Take Pc.  Allergies: 1)  ! Morphine 2)  Ramipril (Ramipril)  Past History:  Past Medical History: Last updated: 10/22/2009 Colonic polyps, hx of Hyperlipidemia Hypertension CAD.Marland Kitchen.(hx apical aneurysm).Marland Kitchen  /  cath..9/ 2006..old occluded graft to RCA....no change  /   cath...6/ 2008...no change..  /    ..myoview...10/ 2010...old scar...no ischemia...EF 43% EF 35-40%...echo...9/ 2009...akinesis periapical wall CABG...1992 ICD   7/ 2008 Chronotropic imcompetence....treated with pacemaker (ICD  03/2007)   (This was placed after CPX done post 2008 cath. CPX showed only chronotropic incompetence) Arrhythmia Congestive heart failure Syncope / dizziness...relative hypotension Mitral Regurg...mild Tingling in the left arm.... February, 2011  Social History: Last updated: 08/19/2007 Retired Married Former Smoker  Review of Systems       The patient complains of fever and prolonged cough.  The patient denies abdominal pain.    Physical Exam  General:  The patient was alert and oriented in no acute distress. Neck veins were flat, carotids were brisk.  Lungs were clear.  Heart sounds were regular without murmurs or gallops.  Abdomen was soft with active bowel sounds. There is no clubbing cyanosis or edema. Skin Warm and dry  Mouth:  Erythematous throat mucosa and intranasal erythema.  Neck:  No deformities, masses, or tenderness noted. C spine NT w/ROM Heart:  Normal rate and regular rhythm. S1 and S2 normal without gallop, murmur, click, rub or other extra sounds. Abdomen:  Bowel sounds positive,abdomen soft and non-tender without masses, organomegaly or hernias noted. Msk:  Neck and L shoulde w/good ROM, NT Neurologic:  Nonfocal   Impression & Recommendations:  Problem # 1:  ARM PAIN (ICD-729.5) L Assessment Improved  Problem # 2:  PARESTHESIA (ICD-782.0) Assessment: Improved  Problem # 3:  HYPERTENSION (ICD-401.9) Assessment: Unchanged  His updated medication list for this problem includes:    Carvedilol 25 Mg Tabs (Carvedilol) .Marland KitchenMarland KitchenMarland KitchenMarland Kitchen  Two times a day    Ramipril 2.5 Mg Caps (Ramipril) .Marland Kitchen... Take one capsule by mouth daily  Problem # 4:  BRONCHITIS, ACUTE (ICD-466.0) Assessment: New  His updated medication list for this problem includes:    Zithromax Z-pak 250 Mg Tabs (Azithromycin) .Marland Kitchen... As dirrected  Complete Medication List: 1)  Lipitor 40 Mg Tabs (Atorvastatin calcium) .... Once daily 2)  Carvedilol 25 Mg Tabs (Carvedilol) .... Two times a day 3)  Digitek 0.125  Mg Tabs (Digoxin) .Marland Kitchen.. 1 by mouth qd 4)  Vitamin D3 1000 Unit Tabs (Cholecalciferol) .Marland Kitchen.. 1 qd 5)  Aspirin 325 Mg Tabs (Aspirin) .Marland Kitchen.. 1 qd 6)  Nitrolingual Duo Pack 0.4 Mg/spray Tl Soln (Nitroglycerin) .... Use prn 7)  Ramipril 2.5 Mg Caps (Ramipril) .... Take one capsule by mouth daily 8)  Zithromax Z-pak 250 Mg Tabs (Azithromycin) .... As dirrected  Patient Instructions: 1)  Please schedule a follow-up appointment in 3 months. 2)  BMP prior to visit, ICD-9: 3)  Lipid Panel prior to visit, ICD-9:272.20  401.1 4)  HbgA1C prior to visit, ICD-9: 5)  Try to eat more raw plant food, fresh and dry fruit, raw almonds, leafy vegetables, whole foods and less red meat, less animal fat. Poultry and fish is better for you than pork and beef. Avoid processed foods (canned soups, hot dogs, sausage, bacon , frozen dinners). Avoid corn syrup, high fructose syrup  containing drinks. Honey, Agave and Stevia are better sweeteners. Make your own  dressing with olive oil, wine vinegar, lemon juce, garlic etc. for your salads. Prescriptions: ZITHROMAX Z-PAK 250 MG TABS (AZITHROMYCIN) as dirrected  #1 x 0   Entered and Authorized by:   Tresa Garter MD   Signed by:   Tresa Garter MD on 11/19/2009   Method used:   Print then Give to Patient   RxID:   [id number]

## 2010-10-15 NOTE — Cardiovascular Report (Signed)
Summary: Office Visit   Office Visit   Imported By: Roderic Ovens 05/08/2010 15:51:27  _____________________________________________________________________  External Attachment:    Type:   Image     Comment:   External Document

## 2010-10-15 NOTE — Assessment & Plan Note (Signed)
Summary: 3 MO ROV /NWS  #   Vital Signs:  Patient profile:   77 year old male Height:      67 inches Weight:      161.75 pounds BMI:     25.43 O2 Sat:      97 % on Room air Temp:     97.3 degrees F oral Pulse rate:   78 / minute BP sitting:   100 / 50  (left arm) Cuff size:   regular  Vitals Entered By: Lucious Groves (February 19, 2010 8:11 AM)  O2 Flow:  Room air CC: 3 mo rtn ov./kb Is Patient Diabetic? No Pain Assessment Patient in pain? no        Primary Care Provider:  Edrei Norgaard  CC:  3 mo rtn ov./kb.  History of Present Illness: The patient presents for a follow up of hypertension, CAD , hyperlipidemia   Current Medications (verified): 1)  Lipitor 40 Mg  Tabs (Atorvastatin Calcium) .... Once Daily 2)  Carvedilol 25 Mg  Tabs (Carvedilol) .... Two Times A Day 3)  Digitek 0.125 Mg Tabs (Digoxin) .Marland Kitchen.. 1 By Mouth Qd 4)  Vitamin D3 1000 Unit  Tabs (Cholecalciferol) .Marland Kitchen.. 1 Qd 5)  Aspirin 325 Mg Tabs (Aspirin) .Marland Kitchen.. 1 Qd 6)  Nitrolingual Duo Pack 0.4 Mg/spray Tl Soln (Nitroglycerin) .... Use Prn 7)  Ramipril 2.5 Mg Caps (Ramipril) .... Take One Capsule By Mouth Daily  Allergies (verified): 1)  ! Morphine 2)  Ramipril (Ramipril)  Past History:  Past Medical History: Last updated: 10/22/2009 Colonic polyps, hx of Hyperlipidemia Hypertension CAD.Marland Kitchen.(hx apical aneurysm).Marland Kitchen  /  cath..9/ 2006..old occluded graft to RCA....no change  /   cath...6/ 2008...no change..  /    ..myoview...10/ 2010...old scar...no ischemia...EF 43% EF 35-40%...echo...9/ 2009...akinesis periapical wall CABG...1992 ICD   7/ 2008 Chronotropic imcompetence....treated with pacemaker (ICD  03/2007)  (This was placed after CPX done post 2008 cath. CPX showed only chronotropic incompetence) Arrhythmia Congestive heart failure Syncope / dizziness...relative hypotension Mitral Regurg...mild Tingling in the left arm.... February, 2011  Social History: Last updated: 08/19/2007 Retired Married Former  Smoker  Review of Systems  The patient denies fever, dyspnea on exertion, and abdominal pain.    Physical Exam  General:  The patient was alert and oriented in no acute distress. Neck veins were flat, carotids were brisk.  Lungs were clear.  Heart sounds were regular without murmurs or gallops.  Abdomen was soft with active bowel sounds. There is no clubbing cyanosis or edema. Skin Warm and dry  Nose:  External nasal examination shows no deformity or inflammation. Nasal mucosa are pink and moist without lesions or exudates. Mouth:  Erythematous throat mucosa and intranasal erythema.  Neck:  No deformities, masses, or tenderness noted. C spine NT w/ROM Lungs:  Normal respiratory effort, chest expands symmetrically. Lungs are clear to auscultation, no crackles or wheezes. Heart:  Normal rate and regular rhythm. S1 and S2 normal without gallop, murmur, click, rub or other extra sounds. Abdomen:  Bowel sounds positive,abdomen soft and non-tender without masses, organomegaly or hernias noted. Msk:  Neck and L shoulde w/good ROM, NT Extremities:  No clubbing, cyanosis, edema, or deformity noted with normal full range of motion of all joints.   Neurologic:  Nonfocal Skin:  aging changes Psych:  Oriented X3.     Impression & Recommendations:  Problem # 1:  CORONARY ARTERY DISEASE (ICD-414.00) Assessment Unchanged  His updated medication list for this problem includes:    Carvedilol 25  Mg Tabs (Carvedilol) .Marland Kitchen..Marland Kitchen Two times a day    Aspirin 325 Mg Tabs (Aspirin) .Marland Kitchen... 1 qd    Nitrolingual Duo Pack 0.4 Mg/spray Tl Soln (Nitroglycerin) ..... Use prn    Ramipril 2.5 Mg Caps (Ramipril) .Marland Kitchen... Take one capsule by mouth daily  Problem # 2:  CONGESTIVE HEART FAILURE (ICD-428.0) Assessment: Unchanged  His updated medication list for this problem includes:    Carvedilol 25 Mg Tabs (Carvedilol) .Marland Kitchen..Marland Kitchen Two times a day    Digitek 0.125 Mg Tabs (Digoxin) .Marland Kitchen... 1 by mouth qd    Aspirin 325 Mg Tabs  (Aspirin) .Marland Kitchen... 1 qd    Ramipril 2.5 Mg Caps (Ramipril) .Marland Kitchen... Take one capsule by mouth daily The labs were reviewed with the patient.   Problem # 3:  HYPERTENSION (ICD-401.9) Assessment: Unchanged  His updated medication list for this problem includes:    Carvedilol 25 Mg Tabs (Carvedilol) .Marland Kitchen..Marland Kitchen Two times a day    Ramipril 2.5 Mg Caps (Ramipril) .Marland Kitchen... Take one capsule by mouth daily  BP today: 100/50 Prior BP: 122/54 (11/19/2009)  Labs Reviewed: K+: 4.6 (02/13/2010) Creat: : 1.3 (02/13/2010)   Chol: 127 (02/13/2010)   HDL: 31.40 (02/13/2010)   LDL: 60 (02/13/2010)   TG: 179.0 (02/13/2010)  Problem # 4:  HYPERLIPIDEMIA (ICD-272.4) Assessment: Comment Only  His updated medication list for this problem includes:    Lipitor 40 Mg Tabs (Atorvastatin calcium) ..... Once daily  Labs Reviewed: SGOT: 20 (02/13/2010)   SGPT: 18 (02/13/2010)   HDL:31.40 (02/13/2010), 36.70 (11/14/2009)  LDL:60 (02/13/2010), 63 (02/21/2009)  Chol:127 (02/13/2010), 131 (11/14/2009)  Trig:179.0 (02/13/2010), 377.0 (11/14/2009)  Complete Medication List: 1)  Lipitor 40 Mg Tabs (Atorvastatin calcium) .... Once daily 2)  Carvedilol 25 Mg Tabs (Carvedilol) .... Two times a day 3)  Digitek 0.125 Mg Tabs (Digoxin) .Marland Kitchen.. 1 by mouth qd 4)  Vitamin D3 1000 Unit Tabs (Cholecalciferol) .Marland Kitchen.. 1 qd 5)  Aspirin 325 Mg Tabs (Aspirin) .Marland Kitchen.. 1 qd 6)  Nitrolingual Duo Pack 0.4 Mg/spray Tl Soln (Nitroglycerin) .... Use prn 7)  Ramipril 2.5 Mg Caps (Ramipril) .... Take one capsule by mouth daily  Patient Instructions: 1)  Please schedule a follow-up appointment in 4 months. 2)  BMP prior to visit, ICD-9: 428.0 Prescriptions: NITROLINGUAL DUO PACK 0.4 MG/SPRAY TL SOLN (NITROGLYCERIN) use prn  #1 x 3   Entered and Authorized by:   Tresa Garter MD   Signed by:   Tresa Garter MD on 02/19/2010   Method used:   Electronically to        [facility] [phone]* (retail)       [address]        [place], [place]  [address]       Ph: [phone]       Fax: [phone]   RxID:   [id number]

## 2010-10-15 NOTE — Procedures (Signed)
Summary: [facility]    Allergies: 1)  ! Morphine 2)  Ramipril (Ramipril)   ICD Specifications Following MD:  Sherryl Manges, MD     Referring MD:  KATZ ICD Vendor:  St Jude     ICD Model Number:  [phone]     ICD Serial Number:  [id number] ICD DOI:  03/26/2007     ICD Implanting MD:  Sherryl Manges, MD  Lead 1:    Location: [facility]     DOI: 03/26/2007     Model #: [id number]     Serial #: [id number]     Status: active Lead 2:    Location: RV     DOI: 03/26/2007     Model #: [id number]     Serial #: [id number]     Status: active  ICD Follow Up Battery Voltage:  3.01 V     Charge Time:  11.5 seconds     Battery Est. Longevity:  4.4-4.8 yrs Underlying rhythm:  SR ICD Dependent:  No       ICD Device Measurements Atrium:  Amplitude: 2.4 mV, Impedance: 540 ohms, Threshold: 1.0 V at 0.4 msec Right Ventricle:  Amplitude: 11.6 mV, Impedance: 440 ohms, Threshold: 0.75 V at 0.4 msec Shock Impedance: 71 ohms   Episodes MS Episodes:  6     Percent Mode Switch:  <1%     Coumadin:  No Shock:  0     ATP:  0     Nonsustained:  0     Atrial Therapies:  0 Atrial Pacing:  95%     Ventricular Pacing:  <1%  Brady Parameters Mode DDDR     Lower Rate Limit:  70     Upper Rate Limit 120 PAV 150     Sensed AV Delay:  150  Tachy Zones VF:  240     VT:  200     VT1:  176     Next Cardiology Appt Due:  07/16/2010 Tech Comments:  PT SEEING DR Myrtis Ser. 6 AMS EPISODES--NOISE ON A LEAD.  UNABLE TO PRINT OFF EGMs.  NORMAL DEVICE FUNCTION.  CHANGED RV OUTPUT FROM 2.0 TO 2.5 V.  PT IS ENROLLED IN [place]. ROV IN OCT W/SK--REMINDER IN [facility]  April 25, 2010 8:29 AM

## 2010-10-15 NOTE — Assessment & Plan Note (Signed)
Summary: L ARM IS TINGLING/ PAIN/ PER DR KATZ/NWS   Vital Signs:  Patient profile:   77 year old male Weight:      171 pounds Temp:     98.1 degrees F oral Pulse rate:   85 / minute BP sitting:   148 / 72  (left arm)  Vitals Entered By: Tora Perches (October 22, 2009 2:46 PM) CC: left arm- tingle Is Patient Diabetic? No   Primary Care Provider:  Dub Maclellan  CC:  left arm- tingle.  History of Present Illness: C/o L dull arm pain and tingling off and on x 6 wks - it would last x 2-3 min and stop., 10-15 times a day. Dr Myrtis Ser said heart was ok this am. No weakness.  Preventive Screening-Counseling & Management  Alcohol-Tobacco     Smoking Status: quit  Current Medications (verified): 1)  Lipitor 40 Mg  Tabs (Atorvastatin Calcium) .... Once Daily 2)  Carvedilol 25 Mg  Tabs (Carvedilol) .... Two Times A Day 3)  Digitek 0.125 Mg Tabs (Digoxin) .Marland Kitchen.. 1 By Mouth Qd 4)  Vitamin D3 1000 Unit  Tabs (Cholecalciferol) .Marland Kitchen.. 1 Qd 5)  Aspirin 325 Mg Tabs (Aspirin) .Marland Kitchen.. 1 Qd 6)  Nitrolingual Duo Pack 0.4 Mg/spray Tl Soln (Nitroglycerin) .... Use Prn 7)  Ramipril 2.5 Mg Caps (Ramipril) .... Take One Capsule By Mouth Daily  Allergies: 1)  ! Morphine 2)  Ramipril (Ramipril)  Past History:  Past Medical History: Last updated: 10/22/2009 Colonic polyps, hx of Hyperlipidemia Hypertension CAD.Marland Kitchen.(hx apical aneurysm).Marland Kitchen  /  cath..9/ 2006..old occluded graft to RCA....no change  /   cath...6/ 2008...no change..  /    ..myoview...10/ 2010...old scar...no ischemia...EF 43% EF 35-40%...echo...9/ 2009...akinesis periapical wall CABG...1992 ICD   7/ 2008 Chronotropic imcompetence....treated with pacemaker (ICD  03/2007)  (This was placed after CPX done post 2008 cath. CPX showed only chronotropic incompetence) Arrhythmia Congestive heart failure Syncope / dizziness...relative hypotension Mitral Regurg...mild Tingling in the left arm.... February, 2011  Social History: Last updated:  08/19/2007 Retired Married Former Smoker  Review of Systems  The patient denies chest pain, syncope, and dyspnea on exertion.    Physical Exam  General:  The patient was alert and oriented in no acute distress. Neck veins were flat, carotids were brisk.  Lungs were clear.  Heart sounds were regular without murmurs or gallops.  Abdomen was soft with active bowel sounds. There is no clubbing cyanosis or edema. Skin Warm and dry  Msk:  Neck and L shoulde w/good ROM, NT Neurologic:  Grip strong B, MS B WNL, no muscle atrophy Skin:  aging changes Psych:  Oriented X3.     Impression & Recommendations:  Problem # 1:  PARESTHESIA (ICD-782.0) LUE - r/o radiculapathy Assessment New Take 40mg  qd for 3 days, then 20 mg qd for 3 days, then 10mg  qd for 6 days, then stop. Take pc.  See "Patient Instructions".  Orders: T-Cervicle Spine 2-3 Views [id number])  Problem # 2:  ARM PAIN (ICD-729.5) L Assessment: New CXR was OK last summer Orders: T-Cervicle Spine 2-3 Views [id number]) Prescription Created Electronically [id number])  Problem # 3:  CORONARY ARTERY DISEASE (ICD-414.00) Assessment: Unchanged  His updated medication list for this problem includes:    Carvedilol 25 Mg Tabs (Carvedilol) .Marland Kitchen..Marland Kitchen Two times a day    Aspirin 325 Mg Tabs (Aspirin) .Marland Kitchen... 1 qd    Nitrolingual Duo Pack 0.4 Mg/spray Tl Soln (Nitroglycerin) ..... Use prn    Ramipril 2.5 Mg Caps (Ramipril) .Marland Kitchen... Take  one capsule by mouth daily  Complete Medication List: 1)  Lipitor 40 Mg Tabs (Atorvastatin calcium) .... Once daily 2)  Carvedilol 25 Mg Tabs (Carvedilol) .... Two times a day 3)  Digitek 0.125 Mg Tabs (Digoxin) .Marland Kitchen.. 1 by mouth qd 4)  Vitamin D3 1000 Unit Tabs (Cholecalciferol) .Marland Kitchen.. 1 qd 5)  Aspirin 325 Mg Tabs (Aspirin) .Marland Kitchen.. 1 qd 6)  Nitrolingual Duo Pack 0.4 Mg/spray Tl Soln (Nitroglycerin) .... Use prn 7)  Ramipril 2.5 Mg Caps (Ramipril) .... Take one capsule by mouth daily 8)  Prednisone 10 Mg Tabs  (Prednisone) .... Take 40mg  qd for 3 days, then 20 mg qd for 3 days, then 10mg  qd for 6 days, then stop. take pc.  Patient Instructions: 1)  Contour pillow 2)  Please schedule a follow-up appointment in 1 month. 3)  BMP prior to visit, ICD-9: 4)  Lipid Panel prior to visit, ICD-9: 5)  TSH prior to visit, ICD-9: 6)  CK 7)  B12 782.0  995.20  Prescriptions: PREDNISONE 10 MG TABS (PREDNISONE) Take 40mg  qd for 3 days, then 20 mg qd for 3 days, then 10mg  qd for 6 days, then stop. Take pc.  #24 x 1   Entered and Authorized by:   Tresa Garter MD   Signed by:   Tresa Garter MD on 10/22/2009   Method used:   Electronically to        [facility] [phone]* (retail)       [address]       [place], [place]  [address]       Ph: [phone]       Fax: [phone]   RxID:   [id number]

## 2010-10-15 NOTE — Assessment & Plan Note (Signed)
Summary: f93m    Visit Type:  Follow-up Primary Provider:  Plotnikov  CC:  CAD.  History of Present Illness: The patient is seen for followup of coronary artery disease.  I saw him last February, 2011.  He was stable at that time but had some arm tingling. He saw Dr.Plotnikov and was treated with a short course of prednisone.  He improved.  He has had no significant problems since.   There is no chest pain and no shortness of breath.  There is no syncope or presyncope.  He has known left ventricular dysfunction.  I have rereviewed his medicines carefully.  There is history in the past question of some mild problems with Aldactone.    Current Medications (verified): 1)  Lipitor 40 Mg  Tabs (Atorvastatin Calcium) .... Once Daily 2)  Carvedilol 25 Mg  Tabs (Carvedilol) .... Two Times A Day 3)  Digitek 0.125 Mg Tabs (Digoxin) .Marland Kitchen.. 1 By Mouth Qd 4)  Vitamin D3 1000 Unit  Tabs (Cholecalciferol) .Marland Kitchen.. 1 Qd 5)  Aspirin 325 Mg Tabs (Aspirin) .Marland Kitchen.. 1 Qd 6)  Nitrolingual Duo Pack 0.4 Mg/spray Tl Soln (Nitroglycerin) .... Use Prn 7)  Ramipril 2.5 Mg Caps (Ramipril) .... Take One Capsule By Mouth Daily  Allergies: 1)  ! Morphine 2)  Ramipril (Ramipril)  Past History:  Past Medical History: Colonic polyps, hx of Hyperlipidemia Hypertension CAD.Marland Kitchen.(hx apical aneurysm).Marland Kitchen  /  cath..9/ 2006..old occluded graft to RCA....no change  /   cath...6/ 2008...no change..  /    ..myoview...10/ 2010...old scar...no ischemia...EF 43% EF 35-40%...echo...9/ 2009...akinesis periapical wall CABG...1992 ICD   7/ 2008 Chronotropic imcompetence....treated with pacemaker (ICD  03/2007)  (This was placed after CPX done post 2008 cath. CPX showed only chronotropic incompetence) Arrhythmia Congestive heart failure Syncope / dizziness...relative hypotension Mitral Regurg...mild Tingling in the left arm.... February, 2011 /   improved after treatment with prednisone spironolactone.... mild dizziness while he was  taking.. 2010.Marland Kitchen..Marland Kitchennot clear if related to medicine... retrial can be considered in the future  Review of Systems       Patient denies fever, chills, headache, sweats, rash, change in vision, change in hearing, chest pain, cough, nausea vomiting, urinary symptoms.  All other systems are reviewed and are negative  Vital Signs:  Patient profile:   77 year old male Height:      67 inches Weight:      154 pounds BMI:     24.21 Pulse rate:   70 / minute BP sitting:   122 / 58  (left arm)  Vitals Entered By: Laurance Flatten CMA (April 25, 2010 8:01 AM)  Physical Exam  General:  he looks quite good today. Eyes:  no xanthelasma. Neck:  no jugular venous distention. Lungs:  lungs are clear.  Respiratory effort is nonlabored. Heart:  cardiac exam reveals S1-S2.  There is a soft systolic murmur. Abdomen:  abdomen is soft. Extremities:  no peripheral edema. Psych:  patient is oriented to person time and place.  Affect is normal.    ICD Specifications Following MD:  Sherryl Manges, MD     Referring MD:  KATZ ICD Vendor:  St Jude     ICD Model Number:  [id number]     ICD Serial Number:  [id number] ICD DOI:  03/26/2007     ICD Implanting MD:  Sherryl Manges, MD  Lead 1:    Location: [facility]     DOI: 03/26/2007     Model #: [id number]     Serial #: [id number]  Status: active Lead 2:    Location: [facility]     DOI: 03/26/2007     Model #: [id number]     Serial #: [id number]     Status: active  ICD Follow Up ICD Dependent:  No      Episodes Coumadin:  No  Brady Parameters Mode DDDR     Lower Rate Limit:  70     Upper Rate Limit 120 PAV 150     Sensed AV Delay:  150  Tachy Zones VF:  240     VT:  200     VT1:  176     Impression & Recommendations:  Problem # 1:  * TINGLING IN THE LEFT ARM FEBRUARY, 2011 This issue is resolved.  Problem # 2:  SYNCOPE (ICD-780.2)  His updated medication list for this problem includes:    Carvedilol 25 Mg Tabs (Carvedilol) .Marland Kitchen..Marland Kitchen Two times a day    Aspirin 325 Mg Tabs (Aspirin) .Marland Kitchen... 1  qd    Nitrolingual Duo Pack 0.4 Mg/spray Tl Soln (Nitroglycerin) ..... Use prn    Ramipril 2.5 Mg Caps (Ramipril) .Marland Kitchen... Take one capsule by mouth daily The patient has had no recurrent syncope.  Problem # 3:  * CHRONOTROPIC INCOMPETENCE chronotropic incompetence is treated with a spacer.  Problem # 4:  CORONARY ARTERY DISEASE (ICD-414.00)  His updated medication list for this problem includes:    Carvedilol 25 Mg Tabs (Carvedilol) .Marland Kitchen..Marland Kitchen Two times a day    Aspirin 325 Mg Tabs (Aspirin) .Marland Kitchen... 1 qd    Nitrolingual Duo Pack 0.4 Mg/spray Tl Soln (Nitroglycerin) ..... Use prn    Ramipril 2.5 Mg Caps (Ramipril) .Marland Kitchen... Take one capsule by mouth daily Coronary disease is stable.  He needs no further workup at this time.  Orders: EKG w/ Interpretation (93000)  Problem # 5:  CARDIOMYOPATHY, ISCHEMIC S/P CABG (ICD-414.8)  His updated medication list for this problem includes:    Carvedilol 25 Mg Tabs (Carvedilol) .Marland Kitchen..Marland Kitchen Two times a day    Digitek 0.125 Mg Tabs (Digoxin) .Marland Kitchen... 1 by mouth qd    Aspirin 325 Mg Tabs (Aspirin) .Marland Kitchen... 1 qd    Nitrolingual Duo Pack 0.4 Mg/spray Tl Soln (Nitroglycerin) ..... Use prn    Ramipril 2.5 Mg Caps (Ramipril) .Marland Kitchen... Take one capsule by mouth daily I have rereviewed the medications for his cardiomyopathy.  In 2010 he had some dizziness while on Aldactone.  It may not have been related to the medication but I'm not sure.  I've chosen not to give him a retrial at this time.  Patient Instructions: 1)  Your physician wants you to follow-up in:  6 months.  You will receive a reminder letter in the mail two months in advance. If you don't receive a letter, please call our office to schedule the follow-up appointment.

## 2010-10-15 NOTE — Procedures (Signed)
Summary: Cardiology Device Clinic      Allergies Added:   Current Medications (verified): 1)  Lipitor 40 Mg  Tabs (Atorvastatin Calcium) .... Once Daily 2)  Carvedilol 25 Mg  Tabs (Carvedilol) .... Two Times A Day 3)  Digitek 0.125 Mg Tabs (Digoxin) .Marland Kitchen.. 1 By Mouth Qd 4)  Vitamin D3 1000 Unit  Tabs (Cholecalciferol) .Marland Kitchen.. 1 Qd 5)  Aspirin 325 Mg Tabs (Aspirin) .Marland Kitchen.. 1 Qd 6)  Nitrolingual Duo Pack 0.4 Mg/spray Tl Soln (Nitroglycerin) .... Use Prn 7)  Ramipril 2.5 Mg Caps (Ramipril) .... Take One Capsule By Mouth Daily  Allergies (verified): 1)  ! Morphine 2)  Ramipril (Ramipril)   ICD Specifications Following MD:  Sherryl Manges, MD     Referring MD:  KATZ ICD Vendor:  [employer]     ICD Model Number:  [id number]     ICD Serial Number:  [id number] ICD DOI:  03/26/2007     ICD Implanting MD:  Sherryl Manges, MD  Lead 1:    Location: RA     DOI: 03/26/2007     Model #: [id number]     Serial #: [id number]     Status: active Lead 2:    Location: RV     DOI: 03/26/2007     Model #: 1027     Serial #: [id number]     Status: active  ICD Follow Up Remote Check?  No Battery Voltage:  3.11 V     Charge Time:  11.5 seconds     Underlying rhythm:  SB ICD Dependent:  No       ICD Device Measurements Atrium:  Amplitude: 2.4 mV, Impedance: 510 ohms, Threshold: 1.0 V at 0.4 msec Right Ventricle:  Amplitude: 11.6 mV, Impedance: 440 ohms, Threshold: 0.75 V at 0.4 msec Shock Impedance: 72 ohms   Episodes MS Episodes:  1     Percent Mode Switch:  <1%     Coumadin:  No Shock:  0     ATP:  0     Nonsustained:  0     Atrial Pacing:  94%     Ventricular Pacing:  <1%  Brady Parameters Mode DDDR     Lower Rate Limit:  70     Upper Rate Limit 120 PAV 150     Sensed AV Delay:  150  Tachy Zones VF:  240     VT:  200     VT1:  176     Next Cardiology Appt Due:  01/14/2010 Tech Comments:  Normal device function. Mode switch episode less than 1 minute.  No changes made today.  ROV 3 months device clinic. Gypsy Balsam RN BSN   October 25, 2009 11:17 AM

## 2010-10-15 NOTE — Progress Notes (Signed)
   Walk in Patient Form Recieved " 5 refills needed please fax in" forwarded to Rusty Aus Mesiemore  October 09, 2009 2:58 PM    Appended Document: ramipril , lanoxin , coreg    Clinical Lists Changes  Medications: Rx of CARVEDILOL 25 MG  TABS (CARVEDILOL) two times a day;  #180 x 0;  Signed;  Entered by: Hardin Negus, RMA;  Authorized by: Talitha Givens, MD, [facility];  Method used: Faxed to [facility], [address] [address], [place], [place]  [address], Ph: [phone], Fax: [phone] Rx of DIGITEK 0.125 MG TABS (DIGOXIN) 1 by mouth qd;  #90 x 0;  Signed;  Entered by: Hardin Negus, RMA;  Authorized by: Talitha Givens, MD, [facility];  Method used: Faxed to [facility], [address] [address], [place], [place]  [address], Ph: [phone], Fax: [phone] Rx of RAMIPRIL 2.5 MG CAPS (RAMIPRIL) Take one capsule by mouth daily;  #90 x 0;  Signed;  Entered by: Hardin Negus, RMA;  Authorized by: Talitha Givens, MD, [facility];  Method used: Faxed to [facility], [address] [address], [place], [place]  [address], Ph: [phone], Fax: [phone]    Prescriptions: RAMIPRIL 2.5 MG CAPS (RAMIPRIL) Take one capsule by mouth daily  #90 x 0   Entered by:   Hardin Negus, RMA   Authorized by:   Talitha Givens, MD, [facility]   Signed by:   Hardin Negus, RMA on 10/10/2009   Method used:   Faxed to ...       [facility] Environmental education officer)       [address] [address]       [place], [place]  [address]       Ph: [phone]       Fax: [phone]   RxID:   [id number] DIGITEK 0.125 MG TABS (DIGOXIN) 1 by mouth qd  #90 x 0   Entered by:   Hardin Negus, RMA   Authorized by:   Talitha Givens, MD, [facility]   Signed by:   Hardin Negus, RMA on 10/10/2009   Method used:   Faxed to ...       [facility] Environmental education officer)       [address] [address]       [place], [place]  [address]       Ph: [phone]       Fax: [phone]   RxID:   [id number] CARVEDILOL 25 MG  TABS (CARVEDILOL) two times a day  #180 x 0   Entered by:    Hardin Negus, RMA   Authorized by:   Talitha Givens, MD, [facility]   Signed by:   Hardin Negus, RMA on 10/10/2009   Method used:   Faxed to ...       [facility] Environmental education officer)       [address] [address]       [place], [place]  [address]       Ph: [phone]       Fax: [phone]   RxID:   [id number]

## 2010-10-15 NOTE — Cardiovascular Report (Signed)
Summary: Office Visit   Office Visit   Imported By: Roderic Ovens 07/18/2010 16:09:04  _____________________________________________________________________  External Attachment:    Type:   Image     Comment:   External Document

## 2010-10-16 ENCOUNTER — Ambulatory Visit (INDEPENDENT_AMBULATORY_CARE_PROVIDER_SITE_OTHER): Payer: MEDICARE | Admitting: Cardiology

## 2010-10-16 ENCOUNTER — Encounter: Payer: Self-pay | Admitting: Cardiology

## 2010-10-16 ENCOUNTER — Ambulatory Visit: Admit: 2010-10-16 | Payer: Self-pay | Admitting: Cardiology

## 2010-10-16 DIAGNOSIS — I2510 Atherosclerotic heart disease of native coronary artery without angina pectoris: Secondary | ICD-10-CM

## 2010-10-17 ENCOUNTER — Ambulatory Visit: Admit: 2010-10-17 | Payer: Self-pay | Admitting: Internal Medicine

## 2010-10-17 ENCOUNTER — Encounter: Payer: Self-pay | Admitting: Internal Medicine

## 2010-10-17 ENCOUNTER — Encounter (INDEPENDENT_AMBULATORY_CARE_PROVIDER_SITE_OTHER): Payer: MEDICARE

## 2010-10-17 DIAGNOSIS — I428 Other cardiomyopathies: Secondary | ICD-10-CM

## 2010-10-17 NOTE — Assessment & Plan Note (Signed)
Summary: YEARLY FU / SECURE HORIZIONS /NWS #   Vital Signs:  Patient profile:   77 year old male Height:      67 inches Weight:      165 pounds BMI:     25.94 Temp:     98.6 degrees F oral Pulse rate:   84 / minute Pulse rhythm:   regular Resp:     16 per minute BP sitting:   130 / 76  (left arm) Cuff size:   regular  Vitals Entered By: Lanier Prude, CMA(AAMA) (September 25, 2010 8:40 AM) CC: MWV Is Patient Diabetic? No   Primary Care Provider:  Plotnikov  CC:  MWV.  History of Present Illness: The patient presents for a preventive health examination  Patient past medical history, social history, and family history reviewed in detail no significant changes.  Patient is physically active. Depression is negative and mood is good. Hearing is normal, and able to perform activities of daily living. Risk of falling is negligible and home safety has been reviewed and is appropriate. Patient has normal height, weight, and visual acuity. Patient has been counseled on age-appropriate routine health concerns for screening and prevention. Education, counseling done. Cognition is nl  Preventive Screening-Counseling & Management  Alcohol-Tobacco     Alcohol drinks/day: 0     Smoking Status: quit > 6 months     Smoking Cessation Counseling: no     Passive Smoke Exposure: yes     Passive Smoke Counseling: not indicated; no passive smoke exposure  Caffeine-Diet-Exercise     Caffeine Counseling: not indicated; caffeine use is not excessive or problematic     Diet Counseling: to improve diet; diet is suboptimal     Nutrition Referrals: no     Does Patient Exercise: yes     Type of exercise: walking     Times/week: 7     Depression Counseling: further diagnostic testing and/or other treatment is indicated  Comments: grief discussed  Hep-HIV-STD-Contraception     Hepatitis Risk: no risk noted     Sun Exposure-Excessive: no  Safety-Violence-Falls     Seat Belt Use: yes     Smoke  Detectors: yes     Fall Risk Counseling: not indicated; no significant falls noted  Current Medications (verified): 1)  Lipitor 40 Mg  Tabs (Atorvastatin Calcium) .... Once Daily 2)  Carvedilol 25 Mg  Tabs (Carvedilol) .... Two Times A Day 3)  Digitek 0.125 Mg Tabs (Digoxin) .Marland Kitchen.. 1 By Mouth Qd 4)  Vitamin D3 1000 Unit  Tabs (Cholecalciferol) .Marland Kitchen.. 1 Qd 5)  Aspirin 325 Mg Tabs (Aspirin) .Marland Kitchen.. 1 Qd 6)  Nitrolingual Duo Pack 0.4 Mg/spray Tl Soln (Nitroglycerin) .... Use Prn 7)  Ramipril 2.5 Mg Caps (Ramipril) .... Take One Capsule By Mouth Daily  Allergies (verified): 1)  ! Morphine 2)  Ramipril (Ramipril)  Past History:  Past Medical History: Last updated: 04/25/2010 Colonic polyps, hx of Hyperlipidemia Hypertension CAD.Marland Kitchen.(hx apical aneurysm).Marland Kitchen  /  cath..9/ 2006..old occluded graft to RCA....no change  /   cath...6/ 2008...no change..  /    ..myoview...10/ 2010...old scar...no ischemia...EF 43% EF 35-40%...echo...9/ 2009...akinesis periapical wall CABG...1992 ICD   7/ 2008 Chronotropic imcompetence....treated with pacemaker (ICD  03/2007)  (This was placed after CPX done post 2008 cath. CPX showed only chronotropic incompetence) Arrhythmia Congestive heart failure Syncope / dizziness...relative hypotension Mitral Regurg...mild Tingling in the left arm.... February, 2011 /   improved after treatment with prednisone spironolactone.... mild dizziness while he was taking.. 2010.Marland Kitchen..Marland Kitchennot  clear if related to medicine... retrial can be considered in the future  Past Surgical History: Last updated: 08/19/2007 Coronary artery bypass graft 1992 Cardiac Cath. Implanted Defibrillator 2008  Family History: Last updated: 08/19/2007 Family History of Colon CA 1st degree relative <60  S Family History Hypertension  Social History: Retired Former Smoker Widow/Widower 10/11 Smoking Status:  quit > 6 months Passive Smoke Exposure:  yes Does Patient Exercise:  yes Hepatitis Risk:  no risk  noted Sun Exposure-Excessive:  no Seat Belt Use:  yes  Review of Systems       The patient complains of depression.  The patient denies anorexia, fever, weight loss, weight gain, vision loss, decreased hearing, hoarseness, chest pain, syncope, dyspnea on exertion, peripheral edema, prolonged cough, headaches, hemoptysis, abdominal pain, melena, hematochezia, severe indigestion/heartburn, hematuria, incontinence, genital sores, muscle weakness, suspicious skin lesions, transient blindness, difficulty walking, unusual weight change, abnormal bleeding, enlarged lymph nodes, angioedema, and testicular masses.    Physical Exam  General:  The patient was alert and oriented in no acute distress. Neck veins were flat, carotids were brisk.  Lungs were clear.  Heart sounds were regular without murmurs or gallops.  Abdomen was soft with active bowel sounds. There is no clubbing cyanosis or edema. Skin Warm and dry  Nose:  External nasal examination shows no deformity or inflammation. Nasal mucosa are pink and moist without lesions or exudates. Mouth:  Erythematous throat mucosa and intranasal erythema.  Msk:  L med knee is tender Psych:  Oriented X3, normally interactive, good eye contact, not suicidal, depressed affect, and tearful.     Impression & Recommendations:  Problem # 1:  HEALTH MAINTENANCE EXAM (ICD-V70.0) Assessment New  Overall doing well, age appropriate education and counseling updated and referral for appropriate preventive services done unless declined, immunizations up to date or declined, diet counseling done if overweight, urged to quit smoking if smokes, most recent labs reviewed and current ordered if appropriate, ecg reviewed or declined (interpretation per ECG scanned in the EMR if done); information regarding Medicare Preventation requirements given if appropriate. Declined vaccines. I have personally reviewed the Medicare Annual Wellness questionnaire and have noted 1.    The patient's medical and social history 2.   Their use of alcohol, tobacco or illicit drugs 3.   Their current medications and supplements 4.   The patient's functional ability including ADL's, fall risks, home safety risks and hearing or visual             impairment. 5.   Diet and physical activities 6.   Evidence for depression or mood disorders The patients weight, height, BMI and visual acuity have been recorded in the chart I have made referrals, counseling and provided education to the patient based review of the above and I have provided the pt with a written personalized care plan for preventive services.  Orders: Medicare -1st Annual Wellness Visit [phone])  Problem # 2:  GRIEF REACTION (ICD-309.0) Assessment: Improved  Complete Medication List: 1)  Lipitor 40 Mg Tabs (Atorvastatin calcium) .... Once daily 2)  Carvedilol 25 Mg Tabs (Carvedilol) .... Two times a day 3)  Digitek 0.125 Mg Tabs (Digoxin) .Marland Kitchen.. 1 by mouth qd 4)  Vitamin D3 1000 Unit Tabs (Cholecalciferol) .Marland Kitchen.. 1 qd 5)  Aspirin 325 Mg Tabs (Aspirin) .Marland Kitchen.. 1 qd 6)  Nitrolingual Duo Pack 0.4 Mg/spray Tl Soln (Nitroglycerin) .... Use prn 7)  Ramipril 2.5 Mg Caps (Ramipril) .... Take one capsule by mouth daily  Patient Instructions: 1)  Please schedule a follow-up appointment in 3 months.   Orders Added: 1)  Medicare -1st Annual Wellness Visit [G0438]

## 2010-10-23 NOTE — Assessment & Plan Note (Signed)
Summary: f89m/RS from [facility]   Vital Signs:  Patient profile:   77 year old male Height:      67 inches Weight:      164 pounds BMI:     25.78 Pulse rate:   85 / minute BP sitting:   136 / 78  (left arm) Cuff size:   regular  Vitals Entered By: Hardin Negus, RMA (October 16, 2010 2:58 PM)  Visit Type:  Follow-up Primary Provider:  Tresa Garter MD  CC:  CAD.  History of Present Illness: Patient is seen for followup of coronary artery disease.  Medically he is doing well.  It is said that his son died of cancer and his wife died of cancer within the past 6 months.  He is managing adequately.  He does not have any chest pain or shortness of breath. alex Current Medications (verified): 1)  Lipitor 40 Mg  Tabs (Atorvastatin Calcium) .... Once Daily 2)  Carvedilol 25 Mg  Tabs (Carvedilol) .... Two Times A Day 3)  Digitek 0.125 Mg Tabs (Digoxin) .Marland Kitchen.. 1 By Mouth Qd 4)  Vitamin D3 1000 Unit  Tabs (Cholecalciferol) .Marland Kitchen.. 1 Qd 5)  Aspirin 325 Mg Tabs (Aspirin) .Marland Kitchen.. 1 Qd 6)  Nitrolingual Duo Pack 0.4 Mg/spray Tl Soln (Nitroglycerin) .... Use Prn 7)  Ramipril 2.5 Mg Caps (Ramipril) .... Take One Capsule By Mouth Daily  Allergies (verified): 1)  ! Morphine 2)  Ramipril (Ramipril)  Past History:  Past Medical History: Colonic polyps, hx of Hyperlipidemia Hypertension.. CAD.Marland Kitchen.(hx apical aneurysm).Marland Kitchen  /  cath..9/ 2006..old occluded graft to RCA....no change  /   cath...6/ 2008...no change..  /    ..myoview...10/ 2010...old scar...no ischemia...EF 43% EF 35-40%...echo...9/ 2009...akinesis periapical wall CABG...1992 ICD   7/ 2008 Chronotropic imcompetence....treated with pacemaker (ICD  03/2007)  (This was placed after CPX done post 2008 cath. CPX showed only chronotropic incompetence) Arrhythmia Congestive heart failure Syncope / dizziness...relative hypotension Mitral Regurg...mild Tingling in the left arm.... February, 2011 /   improved after treatment with  prednisone spironolactone.... mild dizziness while he was taking.. 2010.Marland Kitchen..Marland Kitchennot clear if related to medicine... retrial can be considered in the future  Review of Systems       the patient denies fever, chills, headache, sweats, rash, change in vision, change in hearing, chest pain, cough, nausea vomiting, urinary symptoms.  All other systems are reviewed and are negative.  Physical Exam  General:  patient is stable. Eyes:  no xanthelasma. Neck:  no jugular venous distention. Lungs:    Lungs are clear.  Respiratory effort is unlabored. Heart:  cardiac exam reveals S1-S2.  No clicks or significant murmurs. Abdomen:  abdomen is soft. Extremities:  no peripheral edema. Psych:  patient is oriented to person time and place.  Affect is normal.   Impression & Recommendations:  Problem # 1:  GRIEF REACTION (ICD-309.0) The patient is stable after losing both his son and his wife.  Problem # 2:  CORONARY ARTERY DISEASE (ICD-414.00) Coronary status is stable.  No further workup needed.  Problem # 3:  CONGESTIVE HEART FAILURE (ICD-428.0) Clinically the patient is stable.  He has no overt heart failure.  Patient Instructions: 1)  Your physician wants you to follow-up in:   You will receive a reminder letter in the mail two months in advance. If you don't receive a letter, please call our office to schedule the follow-up appointment. 6 months.

## 2010-11-04 ENCOUNTER — Encounter (INDEPENDENT_AMBULATORY_CARE_PROVIDER_SITE_OTHER): Payer: Self-pay | Admitting: *Deleted

## 2010-11-12 NOTE — Cardiovascular Report (Signed)
Summary: Office Visit Remote   Office Visit Remote   Imported By: Roderic Ovens 11/08/2010 11:16:47  _____________________________________________________________________  External Attachment:    Type:   Image     Comment:   External Document

## 2010-11-12 NOTE — Letter (Signed)
Summary: Remote Device Check  [employer], Main Office  [address]   [place], [place] [address]   Phone: [phone]  Fax: [phone]     November 04, 2010 MRN: [id number]   [name] [name] [address] [place], [place]  [address]   Dear [name],   Your remote transmission was recieved and reviewed by your physician.  All diagnostics were within normal limits for you.  __X___Your next transmission is scheduled for:  01-16-2011.  Please transmit at any time this day.  If you have a wireless device your transmission will be sent automatically.   Sincerely,  Vella Kohler

## 2010-12-27 ENCOUNTER — Encounter: Payer: Self-pay | Admitting: Internal Medicine

## 2010-12-27 ENCOUNTER — Other Ambulatory Visit (INDEPENDENT_AMBULATORY_CARE_PROVIDER_SITE_OTHER): Payer: MEDICARE

## 2010-12-27 ENCOUNTER — Ambulatory Visit (INDEPENDENT_AMBULATORY_CARE_PROVIDER_SITE_OTHER): Payer: MEDICARE | Admitting: Internal Medicine

## 2010-12-27 DIAGNOSIS — I509 Heart failure, unspecified: Secondary | ICD-10-CM

## 2010-12-27 DIAGNOSIS — I10 Essential (primary) hypertension: Secondary | ICD-10-CM

## 2010-12-27 DIAGNOSIS — Z951 Presence of aortocoronary bypass graft: Secondary | ICD-10-CM

## 2010-12-27 DIAGNOSIS — F4321 Adjustment disorder with depressed mood: Secondary | ICD-10-CM

## 2010-12-27 LAB — COMPREHENSIVE METABOLIC PANEL
ALT: 24 U/L (ref 0–53)
Albumin: 3.9 g/dL (ref 3.5–5.2)
CO2: 31 mEq/L (ref 19–32)
Calcium: 9.6 mg/dL (ref 8.4–10.5)
Chloride: 102 mEq/L (ref 96–112)
GFR: 70.8 mL/min (ref >60.00)
Glucose, Bld: 101 mg/dL — ABNORMAL HIGH (ref 70–99)
Potassium: 4.7 mEq/L (ref 3.5–5.1)
Sodium: 141 mEq/L (ref 135–145)
Total Protein: 6.9 g/dL (ref 6.0–8.3)

## 2010-12-27 LAB — CBC WITH DIFFERENTIAL/PLATELET
Basophils Absolute: 0.1 10*3/uL (ref 0.0–0.1)
Eosinophils Absolute: 0.3 10*3/uL (ref 0.0–0.7)
Lymphocytes Relative: 15.4 % (ref 12.0–46.0)
MCHC: 34.1 g/dL (ref 30.0–36.0)
Monocytes Relative: 7.7 % (ref 3.0–12.0)
Neutro Abs: 4.9 10*3/uL (ref 1.4–7.7)
Platelets: 216 10*3/uL (ref 150.0–400.0)
RDW: 14.7 % — ABNORMAL HIGH (ref 11.5–14.6)

## 2010-12-27 LAB — TSH: TSH: 2.05 u[IU]/mL (ref 0.35–5.50)

## 2010-12-27 LAB — BRAIN NATRIURETIC PEPTIDE: Pro B Natriuretic peptide (BNP): 68.8 pg/mL (ref 0.0–100.0)

## 2010-12-27 NOTE — Progress Notes (Signed)
  Subjective:    Patient ID: [name] [name], male    DOB: 1928-10-27, 77 y.o.   MRN: [id number]  HPI  The patient presents for a follow-up of  chronic hypertension, chronic dyslipidemia, CAD controlled with medicines. He is s/p pacemaker/defib    Review of Systems  Constitutional: Negative for appetite change and fatigue.  HENT: Negative for congestion.   Respiratory: Negative for choking.   Cardiovascular: Negative for leg swelling.  Genitourinary: Negative for flank pain and penile swelling.  Neurological: Negative for seizures and headaches.  Psychiatric/Behavioral: Negative for confusion and dysphoric mood.       Objective:   Physical Exam  Constitutional: No distress.  HENT:  Mouth/Throat: No oropharyngeal exudate.  Eyes: Left eye exhibits no discharge.  Neck: No JVD present.  Pulmonary/Chest: He has no wheezes.  Abdominal: He exhibits no mass. There is no tenderness.  Musculoskeletal: He exhibits no edema.  Neurological: No cranial nerve deficit. Coordination normal.  Skin: No rash noted. No pallor.       Lab Results  Component Value Date   WBC 6.7 12/27/2010   HGB 14.3 12/27/2010   HCT 41.9 12/27/2010   PLT 216.0 12/27/2010   CHOL 135 09/24/2010   TRIG 197.0* 09/24/2010   HDL 30.80* 09/24/2010   LDLDIRECT 45.2 11/14/2009   ALT 24 12/27/2010   AST 18 12/27/2010   NA 141 12/27/2010   K 4.7 12/27/2010   CL 102 12/27/2010   CREATININE 1.1 12/27/2010   BUN 13 12/27/2010   CO2 31 12/27/2010   TSH 2.05 12/27/2010   PSA 1.83 09/24/2010   INR 0.9 RATIO 03/24/2007   HGBA1C 6.2 02/13/2010      Assessment & Plan:  CORONARY ARTERY BYPASS GRAFT, HX OF Doing well. Cont  Rx  CONGESTIVE HEART FAILURE Compensated on Rx  HYPERTENSION Cont Rx BP Readings from Last 3 Encounters:  12/27/10 120/70  10/16/10 136/78  09/25/10 130/76     GRIEF REACTION Doing better. Discussed. He is planning to travel to [place] in July

## 2010-12-27 NOTE — Assessment & Plan Note (Signed)
Doing well. Cont Rx 

## 2010-12-27 NOTE — Assessment & Plan Note (Signed)
Cont Rx BP Readings from Last 3 Encounters:  12/27/10 120/70  10/16/10 136/78  09/25/10 130/76

## 2010-12-27 NOTE — Assessment & Plan Note (Signed)
Compensated on Rx 

## 2010-12-27 NOTE — Assessment & Plan Note (Signed)
Doing better. Discussed. He is planning to travel to [place] in July

## 2010-12-28 ENCOUNTER — Encounter: Payer: Self-pay | Admitting: Internal Medicine

## 2010-12-30 ENCOUNTER — Telehealth: Payer: Self-pay | Admitting: Internal Medicine

## 2010-12-30 NOTE — Telephone Encounter (Signed)
[name] [name] , please, inform the patient:  the labs are normal, except for    Please, keep  next office visit appointment.   Thank you !

## 2010-12-31 NOTE — Telephone Encounter (Signed)
Pt informed

## 2011-01-16 ENCOUNTER — Ambulatory Visit (INDEPENDENT_AMBULATORY_CARE_PROVIDER_SITE_OTHER): Payer: MEDICARE | Admitting: *Deleted

## 2011-01-16 ENCOUNTER — Other Ambulatory Visit: Payer: Self-pay

## 2011-01-16 DIAGNOSIS — Z95810 Presence of automatic (implantable) cardiac defibrillator: Secondary | ICD-10-CM

## 2011-01-16 DIAGNOSIS — I428 Other cardiomyopathies: Secondary | ICD-10-CM

## 2011-01-20 ENCOUNTER — Telehealth: Payer: Self-pay | Admitting: Internal Medicine

## 2011-01-20 NOTE — Telephone Encounter (Signed)
PT AWARE WILL FORWARD TO DR Donia Guiles [facility]

## 2011-01-20 NOTE — Telephone Encounter (Signed)
Pt needs letter stating he has a defibrillator and they he is ok to fly, pt going to [place]

## 2011-01-21 NOTE — Telephone Encounter (Signed)
Okay to write him a letter that he is okay to travel with his defibrilloatr; Make sure he has his card for [employer] and in case there is a problem in [place] thx

## 2011-01-22 ENCOUNTER — Encounter: Payer: Self-pay | Admitting: *Deleted

## 2011-01-22 NOTE — Telephone Encounter (Signed)
I called and spoke with the patient and made him aware we will give him a letter stating that it is ok for him to travel with his ICD. He is aware to make sure he carries his ID card with him. I will mail the letter to his home address. He is agreeable. Sherri Rad, RN, BSN

## 2011-01-23 NOTE — Progress Notes (Signed)
icd remote check  

## 2011-01-28 NOTE — Assessment & Plan Note (Signed)
[name]                            CARDIOLOGY OFFICE NOTE   NAME:[name] [name], [name] [name]                         MRN:          [id number]  DATE:06/13/2008                            DOB:          May 04, 1929    [name] [name] is here for followup of his cardiomyopathy and his volume  status and syncopal spells.  When I saw him last on May 30, 2008,  he was stable.  I put his Atacand hydrochlorothiazide, Imdur, and  spironolactone on hold.  He feels much better.  He is not had any  syncope or presyncope.  He is not having any shortness of breath.  He  has not gained any fluid weight.  Overall, he is quite pleased with his  status.  I am concerned that he is not on an ACE inhibitor or an ARB at  this time.  We will restart one.  I have decided not to restart his  diuretic yet.   PAST MEDICAL HISTORY:   ALLERGIES:  MORPHINE.   MEDICATIONS:  Lipitor, aspirin, carvedilol, digoxin, and vitamin D.  Medications on hold include Atacand hydrochlorothiazide, Imdur, and  spironolactone.   OTHER MEDICAL PROBLEMS:  See the complete list on my note of May 24, 2008.   REVIEW OF SYSTEMS:  He feels great and his review of systems is  negative.   PHYSICAL EXAMINATION:  VITAL SIGNS:  Blood pressure 126/70 with a pulse  of 70.  GENERAL:  The patient is oriented to person, time, and place.  Affect is  normal.  HEENT:  No xanthelasma.  He has normal extraocular motion.  NECK:  There are no carotid bruits.  There is no jugular venous  distention.  LUNGS:  Clear.  Respiratory effort is not labored.  CARDIAC:  An S1 with an S2.  There are no clicks or significant murmurs.  ABDOMEN:  Soft.  He has trace peripheral edema in his right leg.  He has  not gained any significant weight since stopping his low-dose  hydrochlorothiazide.   I do want him on an monitor ACE inhibitor.  I cannot find any record  that he had a cough in the past.  He has been on an ARB for a long  time.  We will put him on a generic ACE (Altace 2.5).  I will see him back to  carefully watch him.  If he requires the addition of diuretic, we will  do so.  However, his syncopal and presyncopal spells were impressive and  I want to be sure that we do not cause problems with his medications.     [name] Abed, MD, [facility]  Electronically Signed    JDK/MedQ  DD: 06/13/2008  DT: 06/14/2008  Job #: [id number]

## 2011-01-28 NOTE — Assessment & Plan Note (Signed)
[name]                         ELECTROPHYSIOLOGY OFFICE NOTE   [name], [name] [name]                         MRN:          [id number]  DATE:07/26/2007                            DOB:          1928/12/25    [name] [name] was seen about a month ago.  He was having dizzy spells.  Because of this, we were concerned as to the mechanism.  We gave him an  event recorder which he comes in today having activated on a couple of  occasions.  The one recording that we have is associated with sinus  rhythm.   His energy level is overall improved since chronotropic competence was  restored with his dual chamber device.   His medications are otherwise unchanged.   I should note that he is anemic and that this evaluation has been  deferred to his primary care physician [name] [name].   EXAMINATION:  His blood pressure was 108/70, his pulse was 64.  LUNGS:  Clear.  HEART SOUNDS:  Regular.  EXTREMITIES:  Without edema.   Interrogation of his [employer] Jude ICD demonstrates a P wave of 2.2 with an  impedance of 580, a threshold of 0.75 at 0.5 with an R wave of 11.6,  impedance of 460, a threshold of 0.5 at 0.5, battery voltage 3.2.  He is  atrial paced 90% of the time.  Heart rate excursion was enhanced, sleep  mode was activated.   IMPRESSION:  1. Chronotropic incompetence.  2. Episodes of dizziness correlated with bradycardia, likely related      to either mild bradycardia or more likely vasomotor instability.  3. Ischemic heart disease with depressed left ventricular function,      ejection fraction estimated at 25 percent and prior bypass      grafting.   [name] is stable from my point of view.  He will be following up with  [name] [name] in the next couple of months.  We will see him again in 9  months' time in the device clinic and he will be followed remotely in  the interim.     Duke Salvia, MD, [facility]  Electronically Signed    SCK/MedQ  DD: 07/26/2007   DT: 07/26/2007  Job #: [id number]

## 2011-01-28 NOTE — Assessment & Plan Note (Signed)
[facility]                            CARDIOLOGY OFFICE NOTE   NAME:[name] [name], [name] [name]                         MRN:          [id number]  DATE:10/06/2007                            DOB:          May 20, 1929    HISTORY:  Mr. Gastineau is doing well.  He has been managed expertly by Dr.  Graciela Husbands.  He now has an ICD with pacing function in place.  He has a dual-  chamber device.  He has had chronotropic incompetence and he had an  event recorder showing that there was no major arrhythmia related to  some dizziness.  His unit has been adjusted very nicely and he is  feeling well.  It was felt that some of his dizziness probably did  correlate to some mild bradycardia.  He also may well have some  vasomotor instability.  He is on appropriate medications for his left  ventricular dysfunction.  He is not having chest pain or shortness of  breath.   ALLERGIES:  MORPHINE.   MEDICATIONS:  1. Lipitor.  2. Atacand.  3. Aspirin.  4. Imdur.  5. Carvedilol.  6. Digoxin.  7. Spironolactone.  8. Vitamin D.  9. Multivitamin.   PAST MEDICAL HISTORY:  Other medical problems, see the list below.   REVIEW OF SYSTEMS:  He is doing well and feeling well.  His review of  systems is negative.   PHYSICAL EXAMINATION:  VITAL SIGNS:  Blood pressure today is 103/65 with  a pulse of 90.  GENERAL:  The patient is oriented to person, time and place.  Affect is  normal.  HEENT:  Reveals no xanthelasma.  He has normal extraocular  motion.  There are no carotid bruits.  There is no jugular venous  distention.  CHEST:  His ICD site in the right upper chest is stable and nicely  healed.  CARDIAC:  Reveals S1-S2.  There are no clicks or significant murmurs.  ABDOMEN:  Normal bowel sounds.  EXTREMITIES:  No peripheral edema.   DIAGNOSTICS:  EKG reveals that he is atrially paced.  His rate at the  moment is 69.   PROBLEMS:  1. Severe coronary disease post-coronary artery bypass graft.   Cath in      2006 showed that there was a patent vein to his ramus and a patent      left internal mammary artery.  2. History of mild mitral regurgitation.  When I see him next time, we      will consider whether or not an echo needs to be done.  3. History of ejection fraction in the 25% range and he is on all the      appropriate medicines.  4. History of hypertension, treated.  5. Hyperlipidemia.  6. Chronotropic incompetence and this is now treated with his      pacemaker that is nicely adjusted by Dr. Graciela Husbands.  He has a dual-      chamber implantable defibrillator that was placed on March 23, 2007.  7. History of some arthritis.  8. Some dizzy spells thought to  most probably be due to vasomotor      dysfunction and some bradycardia, and this is better with his      pacemaker adjusted.  He did have a head CT dated July 02, 2007.      There was mild atrophy and no other intracranial abnormality.  This      was the CT without contrast of the head.   PLAN:  Mr. Hodgkin is stable.  I will see him back for cardiology followup  in 6 months.     [name], MD, [facility]  Electronically Signed    JDK/MedQ  DD: 10/06/2007  DT: 10/06/2007  Job #: [id number]   cc:   Georgina Quint. Plotnikov, MD

## 2011-01-28 NOTE — Assessment & Plan Note (Signed)
[facility]                            CARDIOLOGY OFFICE NOTE   NAME:[name] [name]                         MRN:          [id number]  DATE:03/16/2008                            DOB:          [birth year]    [name] [name] is doing very well.  See my complete note of October 06, 2007.  He is feeling quite good.  He still has some slight dizziness.  He is  not having any chest pain.   He has an ICD in place.  He has LV dysfunction.  He has significant  coronary disease.  He is stable however.   ALLERGIES:  MORPHINE.   MEDICATIONS:  1. Lipitor.  2. Atacand.  3. Hydrochlorothiazide.  4. Aspirin.  5. Imdur.  6. Carvedilol.  7. Digoxin.  8. Centrum.  9. Spironolactone.  10.Vitamin D.   OTHER MEDICAL PROBLEMS:  See the complete list of my note of October 06, 2007.   REVIEW OF SYSTEMS:  He is doing well other than the mild dizziness.  It  is possible that some of his dizziness is related to his vasomotor  instability.  It would seem that we might be able to help him by  lowering his Imdur dose.  I have looked back and I cannot find any  absolute indication at this time for his nitrate.  He is on an ARB and  carvedilol for his LV.   [name] [name] is stable.  We will lower his Imdur dose from 60 to 30 a day,  and then I will see him back to see how he is feeling.     [name] Abed, MD, [facility]  Electronically Signed    JDK/MedQ  DD: 03/16/2008  DT: 03/17/2008  Job #: [id number]   cc:   [name] Quint. Plotnikov, MD

## 2011-01-28 NOTE — Assessment & Plan Note (Signed)
[facility]                            CARDIOLOGY OFFICE NOTE   NAME:Gaccione, SAMYAK SACKMANN                         MRN:          [id number]  DATE:05/30/2008                            DOB:          08-17-1929    Mr. Kreiser is seen for followup.  I had seen him on May 24, 2008.  He  was having presyncopal spells and I was quite worried about him.  His  blood pressure was low.  I put his Atacand, hydrochlorothiazide, Imdur,  and spironolactone on hold.  He returns today.  He feels better.  He is  not having any significant symptoms.  He has not gathered fluid weight.  I also decided to repeat his 2-D echo.  In the past, his ejection  fraction was in the 25% range.  It is thought now to be in the 35-40%  range.  His mitral regurgitation is only mild.   I am pleased with his overall status.   PAST MEDICAL HISTORY:   ALLERGIES:  MORPHINE.   MEDICATIONS:  Lipitor, aspirin, carvedilol, digoxin, multivitamin, and  vitamin D.  His Atacand, hydrochlorothiazide, Imdur, and spironolactone  will remain on hold.   OTHER MEDICAL PROBLEMS:  See the list on the note of May 24, 2008.   REVIEW OF SYSTEMS:  He feels good and his review of systems is otherwise  negative.   PHYSICAL EXAMINATION:  VITAL SIGNS:  Blood pressure today is 110/63 with  a pulse of 70.  His weight is 161 pounds, which is unchanged from  May 24, 2008.  GENERAL:  The patient is oriented to person, time, and place.  Affect is  normal.  HEENT:  No xanthelasma.  He has normal extraocular motion.  NECK:  There are no carotid bruits.  There is no jugular venous  distention.  LUNGS:  Clear.  Respiratory effort is not labored.  CARDIAC:  An S1 with an S2.  There are no clicks or significant murmurs.  ABDOMEN:  Soft.  He has no significant peripheral edema.   Problems are listed on the note of May 24, 2008.  I believe some of  his symptoms were related to hypotension.  He seems much  better with his  medications cut back.  Of course, he needs medications for his left  ventricular dysfunction, even though it is better.  We need to watch his  volume status.  As of today, I will make no changes.  I will see him  back soon.  At that time, I may restart a lower dose of Atacand.  #2.  Mild mitral regurgitation.  This remains mild by echo and as  outlined his ejection fraction is now 35-40%.     [name], MD, [facility]  Electronically Signed    JDK/MedQ  DD: 05/30/2008  DT: 05/30/2008  Job #: [id number]   cc:   Georgina Quint. Plotnikov, MD

## 2011-01-28 NOTE — H&P (Signed)
[facility] ADMISSION   NAME:[name] [name], [name] [name]                         MRN:          [id number]  DATE:03/24/2007                            DOB:          09-23-1928    The patient to be admitted to [facility] Friday, March 26, 2007, for  insertion of a dual-chamber device for chronotropic incompetence by Dr.  Berton Mount.   PRIMARY CARDIOLOGIST:  Willa Rough, M.D.   PRIMARY CARE PHYSICIAN:  Jacinta Shoe, M.D.   CHIEF COMPLAINT:  Shortness of breath, fatigue.   HISTORY OF PRESENT ILLNESS:  This is a 77 year old white male patient  who Dr. Graciela Husbands saw in consult on Feb 12, 2007, for evaluation of possible  defibrillator.  The patient has a history of ischemic heart disease  status post CABG in 1992 and a catheterization in 2006 demonstrating  totally occluded native vessels and patent grafts.  His EF at that time  was 45%.  The patient had since had deterioration of his LV function,  ejection fraction most recently 25%.  Dr. Graciela Husbands recommended repeat  cardiac catheterization which was performed by Dr. Gala Romney on February 26, 2007.  This revealed severe native coronary artery disease with two of  three bypass grafts patent including the LIMA to the LAD and the SVG to  the ramus.  There was total occlusion of the SVG to the RCA, which is  chronic.  There was moderately reduced LV function, ejection fraction  35%, with apical aneurysm.  There was also mild pulmonary arterial  hypertension with normal wedge pressures and 1-2+ MR.  Given that his  revascularizations were intact and filling pressures were relatively  normal, he felt it was reasonable to proceed with cardiopulmonary  exercise testing to clearly evaluate the magnitude of his functional  limitation as well as his etiology.  Cardiopulmonary exercise testing,  the patient's gas exchange demonstrated low-normal function in all  capabilities.  Despite this,  there appears to be a circulatory  impairment limiting his potential functional capabilities.  His resting  heart rate was 45, peak exercise heart rate was 97.  Based on these  tests, Dr. Graciela Husbands recommended a dual-chamber device and not a CPT.   The patient says he is ready for the defibrillator.  He is tired of  being short of breath and easily fatigued and unable to do anything.   ALLERGIES:  MORPHINE.   CURRENT MEDICATIONS:  1. Lipitor 40 mg daily.  2. Atacand/hydrochlorothiazide 16/12.5 mg daily.  3. Multivitamin and vitamin E 800 units daily.  4. Aspirin 325 daily.  5. Imdur 60 mg daily.  6. Carvedilol 25 mg b.i.d.  7. Digoxin 0.125 mg daily.   PAST MEDICAL HISTORY:  Significant for:  1. Hypertension.  2. Dyslipidemia.  3. Arthritis.  4. Colonic polyp.   SOCIAL HISTORY:  He is married.  Wife is in poor health with dementia.  He has four children.  He does not use cigarettes, alcohol or  recreational drugs.   FAMILY HISTORY:  Nonapplicable.   REVIEW OF SYSTEMS:  He does have some orthostatic intolerance with  dizziness when he stands up.  No syncope.  He does have some edema.  He  denies any nocturnal dyspnea.  He has occasional palpitations.  No  nausea, vomiting, change in bowels, or melena.  Cardiopulmonary:  See  HPI.   PHYSICAL EXAMINATION:  This is a very pleasant 77 year old white male in  no acute distress.  Blood pressure 117/63, pulse 70, weight 176.  HEENT:  Head is normocephalic without signs of trauma.  Extraocular  movements are intact.  Pupils are equal and reactive to light and  accommodation.  Nasal mucosa is moist.  Throat is without erythema or  exudate.  NECK:  Without JVD, HJR, bruit, or thyroid enlargement.  LUNGS:  Clear anterior, posterior and lateral without wheezing, rales or  rhonchi.  HEART:  Regular rate and rhythm at 60 beats per minute.  Normal S1, S2.  Quiet precordium.  No S3, S4, murmur, rub, bruit, thrill or heave noted.  ABDOMEN:   Soft without organomegaly, masses, lesions or abnormal  tenderness.  Right groin is without hematoma or hemorrhage from prior  catheterization site.  EXTREMITIES:  Without cyanosis, clubbing or edema.  He has good distal  pulses.   IMPRESSION:  1. Chronotropic incompetence with left ventricular dysfunction and      ischemic heart disease, for a dual-chamber implantable cardioverter-      defibrillator on March 26, 2007.  2. Coronary artery disease status post coronary artery bypass grafting      in 1992.  Most recent catheterization on February 26, 2007, showed two      of three bypass grafts patent with moderate left ventricular      dysfunction, ejection fraction 35% with apical aneurysm.  3. One- to two-plus mitral regurgitation.  4. Mild pulmonary artery hypertension with normal wedge pressures.  5. Hypertension.  6. Dyslipidemia.  7. Arthritis.   PLAN AT THIS TIME:  The patient will be admitted Friday for dual-chamber  device by Dr. Graciela Husbands.  He understands the risks and benefits and is  anxious to proceed.      Jacolyn Reedy, PA-C  Electronically Signed      Duke Salvia, MD, [facility]  Electronically Signed   ML/MedQ  DD: 03/24/2007  DT: 03/24/2007  Job #: [id number]

## 2011-01-28 NOTE — Assessment & Plan Note (Signed)
[facility]                            CARDIOLOGY OFFICE NOTE   NAME:Rutkowski, DEOVION BATREZ                         MRN:          [id number]  DATE:10/05/2008                            DOB:          05/16/1929    Mr. Vaile is doing very well.  Since his medication changes months ago,  he has not had any further dizziness.  He has increased exercise  capacity and he is doing well overall.  He is on appropriate medications  and going about full activities.   ALLERGIES:  MORPHINE.   MEDICATIONS:  See the flow sheet.   REVIEW OF SYSTEMS:  He is not having any fevers or chills.  He has no  headaches or skin rashes.  His review of systems is negative.   PHYSICAL EXAMINATION:  VITAL SIGNS:  Blood pressure 130/70, pulse 68,  weight is 164 pounds.  GENERAL:  The patient is oriented to person, time, and place.  Affect is  normal.  HEENT:  No xanthelasma.  He has normal extraocular motion.  NECK:  There are no carotid bruits.  There is no jugular venous  distention.  LUNGS:  Clear.  Respiratory effort is not labored.  CARDIAC:  S1 with an S2.  There are no clicks or significant murmurs.  ABDOMEN:  Soft.  EXTREMITIES:  No peripheral edema.   PROBLEMS:  1. Severe coronary disease with prior CABG stable.  2. Cardiomyopathy, ejection fraction 35%.  3. Apical aneurysm.  4. Status post implantable cardioverter-defibrillator.  5. Syncope and dizziness related to relative hypotension and his meds      were adjusted.  6. History of mild mitral regurgitation.  7. History of hypertension, treated.  8. Hyperlipidemia, treated.  9. Chronotropic incompetence, treated with a pacemaker.   He is doing better at this time.  He is on statin for his coronary  artery disease and aspirin.  He is on carvedilol, digoxin, and ramipril  for his left ventricular dysfunction.  He had been on spironolactone.  This has been on hold since some of his medicines were held in September  2009 with dizziness.  At that time, we stopped the ARB that was in  combination with a diuretic at that time.  We also stopped Imdur and  spironolactone.  Over time, I will re-consider  whether we should add back spironolactone.  He feels great at this time  and I am not inclined to change his medicines.     [name], MD, [facility]  Electronically Signed    JDK/MedQ  DD: 10/05/2008  DT: 10/05/2008  Job #: [id number]

## 2011-01-28 NOTE — Assessment & Plan Note (Signed)
[facility]                            CARDIOLOGY OFFICE NOTE   NAME:Asche, FRIEND DORFMAN                         MRN:          [id number]  DATE:07/06/2008                            DOB:          1929-08-02    Mr. Smoak is doing very well.  He had had a syncopal episode and we  adjusted his medicines.  I was able to restart a low-dose of an ACE  inhibitor and he is tolerating it well.  In the meantime, he has had a  followup EP evaluation and his EP status is stable.  He is not having  any chest pain or difficulties and there is no shortness of breath.   PAST MEDICAL HISTORY:   ALLERGIES:  MORPHINE.   MEDICATIONS:  1. Lipitor.  2. Aspirin.  3. Carvedilol 25 b.i.d.  4. Digoxin 0.125.  5. Vitamin D.  6. Ramipril 2.5.   OTHER MEDICAL PROBLEMS:  See the complete list from the note of  September, 9, 2009.   REVIEW OF SYSTEMS:  He has no GI or GU complaints.  His review of  systems is negative, otherwise.   PHYSICAL EXAMINATION:  Blood pressure is 129/73 with a pulse of 69.  The  patient is oriented to person, time, and place.  Affect is normal.  HEENT reveals no xanthelasma.  He has normal extraocular motion.  There  are no carotid bruits.  There is no jugular venous distention.  Lungs  are clear.  Respiratory effort is not labored.  Cardiac exam reveals  normal S1 and S2.  There are no clicks or significant murmurs.  The  abdomen is soft.  There is no peripheral edema.   PROBLEMS:  1. Severe coronary disease.  See the prior notes.  2. History of mild mitral regurgitation.  3. History of hypertension.  His blood pressure had gotten low on      medications and we have adjusted them and he is stable right now.  4. Hyperlipidemia.  5. Chronotropic incompetence and that is treated with his pacemaker.  6. History of dizzy spells.  7. Syncope that had occurred in early September.  I held several of      his medicines and he felt much better.  I then  readded some of his      medicines.  I am not inclined to push his medicines any further at      this point.  He feels great.   I will see him back in 3 months.     [name], MD, [facility]  Electronically Signed    JDK/MedQ  DD: 07/06/2008  DT: 07/07/2008  Job #: [id number]

## 2011-01-28 NOTE — Assessment & Plan Note (Signed)
[facility]                            CARDIOLOGY OFFICE NOTE   NAME:[name], [name] NIEBLAS                         MRN:          [id number]  DATE:02/17/2007                            DOB:          [birth year]    I had seen [name] on December 30, 2006.  His ejection fraction is low,  and he has fatigue at times.  I felt it was appropriate to refer him for  consultation concerning possible ICD placement.  Labs that were checked  on that day revealed a BUN of 20 and a creatinine of 1.3, and TSH was  normal.  The patient was seen on Feb 12, 2007 by Dr. Graciela Husbands.  Dr. Graciela Husbands  walked him on the treadmill.  His peak heart rate was 80, and he did  have some blood pressure drop at peak exercise.  It was felt that he was  post CABG and had an ejection fraction of 25% with 2+ MR.  He appears to  have chronotropic incompetence, some of which may be medicine related.  He has impaired exercise tolerance, and time was felt that proceeding  with an ICD with pacing function would be appropriate.  Dr. Graciela Husbands also  questioned as to whether or not ischemia could be playing a role and  whether or not catheterization should be done.   I saw [name] back today.  He and I have discussed these issues, and we  are both in agreement that we should proceed with catheterization to  gather more data before finalizing the procedure by Dr. Graciela Husbands.   Today in the office he was stable.  His blood pressure was 110/60, pulse  52.   PAST MEDICAL HISTORY:   ALLERGIES:  MORPHINE.   MEDICATIONS:  1. Lipitor 40.  2. Atacand hydrochlorothiazide 16/12.5.  3. Aspirin 325 daily.  4. Imdur 60 daily.  5. Carvedilol 25 b.i.d.  6. Digoxin 0.125.  It is of note that his recent digoxin level was      0.6.   REVIEW OF SYSTEMS:  His review of systems today reveals that he  continues to have some exertional fatigue; otherwise, his review of  systems is negative.   PROBLEMS:  1. Severe coronary disease  with history of CABG.  A cath in 2006      showed there was a patent vein to his ramus and a patent LIMA.  2. History of mitral regurgitation, 2+.  3. Ejection fraction now in the 25% range.  4. History of hypertension.  5. Hyperlipidemia.  6. Chronotropic incompetence, some of which may be medicine related.  7. Exercise intolerance.  We do not know if this is due to his      chronotropic incompetence of possibly additional ischemia.  We will      proceed with catheterization.  This will be arranged.     [name], MD, [facility]  Electronically Signed    JDK/MedQ  DD: 02/17/2007  DT: 02/17/2007  Job #: [id number]   cc:   Georgina Quint. Plotnikov, MD  Duke Salvia, MD, [facility]

## 2011-01-28 NOTE — Assessment & Plan Note (Signed)
[facility]                         ELECTROPHYSIOLOGY OFFICE NOTE   NAME:[name] [name], [name] [name]                         MRN:          [id number]  DATE:06/20/2008                            DOB:          1929-07-30    Mr. Mejorado was seen in followup for an ICD implanted for primary  prevention and setting of ischemic heart disease.  He is having problems  with dizziness and lightheadedness and finally syncope.  Dr. Myrtis Ser made  the diagnosis of hypotension, changed bunch of his medications, and his  symptoms have resolved accompanied by further increase in his overall  energy level.   CURRENT MEDICATIONS:  1. Lipitor.  2. Aspirin.  3. Carvedilol 25 b.i.d.  4. Digoxin 0.125.  5. Ramipril 2.5.   His Imdur and spironolactone have been discontinued.  His Atacand HCT  switch for the below.   PHYSICAL EXAMINATION:  VITAL SIGNS:  His blood pressure today was 130/72  with the pulse of 72, his weight was 160 which is stable.  LUNGS:  Clear.  HEART:  Sounds were regular.  EXTREMITIES:  Without edema.   Interrogation of his [employer] device demonstrates P-wave of 2.6 with  impedance of 500, threshold 0.75 at 0.5 in both chambers, the R-wave was  11.6 with impedance of 460, battery voltage of 3.2.  There are no  intercurrent episodes.   IMPRESSION:  1. Ischemic heart disease with,      a.     Prior coronary artery bypass graft.      b.     Ejection fraction of 35%.      c.     Apical aneurysm.  2. Status post implantable cardioverter-defibrillator for primary      prevention.  3. Syncope and dizziness attributable to hypotension, now improved and      resolved following marked medication rearrangements.   We will plan to see him again in one year's time.     Duke Salvia, MD, [facility]  Electronically Signed    SCK/MedQ  DD: 06/20/2008  DT: 06/21/2008  Job #: [id number]

## 2011-01-28 NOTE — Assessment & Plan Note (Signed)
[facility]                         ELECTROPHYSIOLOGY OFFICE NOTE   NAME:[name], [name] ALLMAN                         MRN:          [id number]  DATE:04/14/2007                            DOB:          1929-01-27    [name] was seen today in the clinic on April 14, 2007, for a wound  check of his newly-implanted St. Jude model [id number] Current, date of  implant was March 26, 2007, for ischemic cardiomyopathy.  On  interrogation of his device today, his battery voltage is greater than  3.20 with a charge time of 10.3 seconds.  P waves measured 2.2 mV with  an atrial capture threshold of 0.75 V at 0.5 msec and an atrial lead  impedance of 460 Ohms.  R waves measured 11.6 mV with a ventricular  capture threshold of 0.75 V at 0.5 msec, and a ventricular lead  impedance of 480 Ohms.  Shock impedance was 55.  There were no episodes  since the implant date and no changes were made in his parameters.  He  is ventricularly pacing less than 1% of the time.  His Steri-Strips were  removed.  His wound is without redness or edema, and he will follow up  in 3 months' time.      Altha Harm, LPN  Electronically Signed      Duke Salvia, MD, [facility]  Electronically Signed   PO/MedQ  DD: 04/14/2007  DT: 04/15/2007  Job #: [id number]

## 2011-01-28 NOTE — Assessment & Plan Note (Signed)
[facility]                            CARDIOLOGY OFFICE NOTE   NAME:[name], [name] MARIANI                         MRN:          [id number]  DATE:02/04/2007                            DOB:          07-Dec-1928    [name] returns.  I had started him on digoxin and he is stable with  this.  He has severe coronary disease post CABG.  He has 2+ mitral  regurgitation.  His most recent ejection fraction in January showed that  his EF remains in the 25% range.  This was by research echo that I  reviewed.  There is not a formal report for this echo available.  The  patient does have systolic chronic congestive heart failure and he is  New York Heart Association Class II.  He does have shortness of breath  with exertion.  He does not have PND or orthopnea.   PAST MEDICAL HISTORY:  ALLERGIES:  MORPHINE.   Medications:  1. Lipitor 40.  2. Atacand/hydrochlorothiazide 16/12.5.  3. Multivitamin.  4. Aspirin 325.  5. Imdur 60.  6. Carvedilol 25 b.i.d.  7. Digoxin 0.125 mg daily.   Other Medical Problems:  See the complete list below.   REVIEW OF SYSTEMS:  He does feel some mild swelling in his hands.  He  does have some peripheral edema and I do believe he is mildly volume  overloaded.  He does have shortness of breath with exertion but not at  rest.  He also has noticed some weakness in his legs with exertion but  no pain.  Otherwise, his review of systems is negative.   PHYSICAL EXAM:  Blood pressure is 114/64 with a pulse of 57.  Weight is  179 pounds, which is stable for him.  Patient is oriented to person,  time and place and his affect is normal.  HEENT:  Reveals no xanthelasma.  He has normal extraocular motion.  He  has normal conjunctiva.  There are no carotid bruits.  There is no  jugular venous distention.  LUNGS:  Clear.  Respiratory effort is not labored.  CARDIAC EXAM:  Reveals an S1 with an S2.  There are no clicks or  significant murmurs.  ABDOMEN:  Soft.  He has normal bowel sounds.  He has trace to 1+  peripheral edema.  There are no major musculoskeletal deformities.   EKG done today reveals sinus bradycardia with a rate of 57.  He has old  decreased anterior R-wave progression.   PROBLEMS:  1. Severe coronary disease post coronary artery bypass graft.      Catheterization in 2006 shows patent vein to the ramus and a patent      left internal mammary artery.  2. History of 2+ mitral regurgitation.  3. Ejection fraction by echocardiogram review in January of 2008 with      an ejection fraction in the range of 25-35%.  4. History of hypertension, treated.  5. History of hyperlipidemia, treated.  6. Leg fatigue with walking.  We will follow this at this point  without further studies.  7. Clinical chronic systolic congestive heart failure that is with      Class II New York Heart Association rating.  8. Mild volume overload at this time.   I have reviewed the situation carefully with Mr. Clelia Croft.  Because he is  mildly volume overloaded and he has symptoms I will start spirolactone  today at 25 mg daily with careful attention to watching his potassium.  Recent potassium was 4.2.  He is on Atacand.  We will have him back for  early potassium followup and to make sure that he is not taking any  supplemental potassium.  Also, it is now time to proceed with EP  evaluation to see if he is a candidate for ICD placement.  He is 77.  He  is active.  I believe that formal consultation is appropriate.   Will see him back for early followup to see how he is doing with his  medication changes.  A digoxin level will also be checked soon.     [name], MD, [facility]  Electronically Signed    JDK/MedQ  DD: 02/04/2007  DT: 02/04/2007  Job #: [id number]   cc:   Georgina Quint. Plotnikov, MD

## 2011-01-28 NOTE — Op Note (Signed)
NAMETY, BUNTROCK NO.:  [id number]   MEDICAL RECORD NO.:  [id number]          PATIENT TYPE:  INP   LOCATION:  2002                         FACILITY:  [facility]   PHYSICIAN:  Duke Salvia, MD, FACCDATE OF BIRTH:  [birth year]   DATE OF PROCEDURE:  03/26/2007  DATE OF DISCHARGE:  03/27/2007                               OPERATIVE REPORT   PREOPERATIVE DIAGNOSIS:  Chronotropic incompetence, ischemic  cardiomyopathy with depressed left ventricular function.   POSTOPERATIVE DIAGNOSIS:  Chronotropic incompetence, ischemic  cardiomyopathy with depressed left ventricular function.   PROCEDURE:  Dual chamber defibrillator implantation with intraoperative  defibrillation threshold testing.   Following obtaining informed consent, the patient was brought to the  electrophysiology laboratory and placed on the fluoroscopic table in a  supine position.  After routine prep and drape of the right upper chest,  lidocaine was infiltrated in the prepectoral subclavicular region.  An  incision was made and carried down to the layer of the prepectoral  fascia with electrocautery and sharp dissection.  A pocket was formed  similarly.  Hemostasis was obtained.   Thereafter, attention was turned to gaining access to the extrathoracic  right subclavian vein which was accomplished with mild difficulty and  the artery was punctured on one occasion. There was no aspiration of  air.  Pressure was held for greater than a minute.  Two separate  venipunctures were then successfully accomplished.  Guidewires were  placed and retained. Sequentially, 8 and 7 French hemostatic tearaway  introducer sheaths were placed through which were passed a St. Jude  Durata 7122 single coil active fixation defibrillator lead, serial  number [id number] and a St. Jude 1688TC 52 cm active fixation atrial lead,  serial number [id number].  Under fluoroscopic guidance, with a moderate  amount of difficulty, the right  ventricular lead was manipulated to the  right ventricular apex where the bipolar R-wave was 14.5 with a pacing  impedance 697 ohms, a threshold 1.2 volts at 0.5 milliseconds.  Current  of threshold 2 MA.  There is no diaphragmatic pacing at 10 volts and the  current of injury was brisk.   Multiple sites were then taken in the right atrium.  We had sites where  P-waves were 5 and the threshold was 5, P-waves were 1 and the  thresholds were 1.  We actually placed the lead when we were starting to  close the pocket and reassessment demonstrated a fall in the amplitude  as well as a rise in the threshold and so it was elected to reposition  the lead again.  In the final location in the lower lateral atrium, the  bipolar P-wave was 2.3 with a pacing impedance of 592 with threshold 2.1  volts at 0.5 milliseconds.  Current of threshold was 4.2 MA.  The  current of injury was brisk and no diaphragmatic stimulation at 10  volts.   These leads were then attached to a St. Jude current DRRF ICD model  number [phone], serial number [id number].  Through the device, bipolar R-  wave was 11.6 with pacing impedance  of 550, a threshold 0.5 volts at 0.5  milliseconds. The P-wave was 1.2 with a pacing impedance of 40 ohms and  threshold 2 volts at 0.5 milliseconds.  High voltage impedance was 68  ohms.   With these acceptable parameters recorded, defibrillation threshold  testing was taken.  Ventricular fibrillation was induced via the T-wave  shock.  After a total duration of about 6 seconds, a 15 joules shock was  delivered.  It failed to terminate ventricular fibrillation.  After a  total duration of 12 seconds, a 25 joules shock was delivered through a  measured resistance of 68 ohms terminating ventricular fibrillation and  restoring sinus rhythm.   It was in this interval that the atrial lead was repositioned so after  about 7-8 minutes, ventricular fibrillation was re-induced.  After a  total  duration of 6.5 seconds, a 25 joules shock was delivered through a  measured resistance of 66 ohms terminating ventricular fibrillation and  restoring sinus rhythm.   At this point, the device was implanted.  The pocket was copiously  irrigated with antibiotic containing saline solution.  Hemostasis was  assured.  The leads and pulse generator were previously placed in the  pocket and were secured to the prepectoral fascia.  The wound was closed  in three layers in normal fashion.  The wound was washed, dried and  Benzoin and a Steri-Strip dressing was applied.  Needle counts, sponge  counts and instrument counts were correct at the end of procedure  according to the staff.  The patient tolerated the procedure without  apparent complication.      Duke Salvia, MD, [facility]  Electronically Signed     SCK/MEDQ  D:  03/26/2007  T:  03/27/2007  Job:  [id number]   cc:   [name], MD, [facility]

## 2011-01-28 NOTE — Letter (Signed)
Feb 12, 2007    [name], MD, [facility]  [address]  [address]  [place]  [place] [address]   RE:  [name] [name], [name] [name]  MRN:  [id number]  /  DOB:  08-26-1929   Dear [name] [name],   Thank you very much for asking me to see [name] [name] in consultation  regarding ICD implantation.  As you know, he has ischemic heart disease,  status post bypass surgery in 1992 with a catheterization in 2006,  demonstrating totally occluded native vessels, and patent grafts.  His  ejection fraction at that time was 45%.  He has had intercurrent  deterioration of his LV systolic function, most recently at 25%.  He is  also had significant impairment in his exercise tolerance going from  being able to walk 5 miles a year ago to being able to walk barely one-  half mile now.  He has also had some vague chest pressure over the last  six months, that this is not necessarily exertional.   He has had no syncope.  He does have some presyncope, and orthostatic  intolerance.   He has had some edema, but has not had a nocturnal dyspnea.   He has occasional palpitations.   His other cardiac history is notable for having 2+ MR.   This past medical history, in addition to the above, is notable for  hypertension and dyslipidemia.   His medical review of systems is notable for some arthritis and some  colonic polyps, but is otherwise, broadly negative across multiple organ  systems.   He has had no other surgeries.   Social history he is married; but his wife is in poor health with  dementia.  He has four children.  He does not use cigarettes, alcohol or  recreational drugs.   His current medications include:  Lipitor 40, Atacand/HCT 16/12.5,  aspirin 325, and your 60, carvedilol 25 b.i.d., and Digoxin 0.125.   He is allergic to morphine.   On examination, he is an elderly, Caucasian male appearing as stated age  of 69.  His blood pressure is 140/73.  His pulse is 58.  His HEENT exam  demonstrated no icterus or  xanthelasma.  The neck veins were flat.  The  carotids were brisk and full bilaterally without bruits.  Back was  without kyphosis or scoliosis.  Lungs were clear.  Heart sounds were  regular with an early systolic murmur.  The abdomen was soft with active  bowel sounds without midline pulsations or hepatomegaly.  Femoral pulses  were 2+.  Distal pulses were intact.  There was no clubbing, cyanosis,  or edema.  Neurologic exam was grossly normal.  Skin was warm and dry.   Electrocardiogram dated, today, demonstrated sinus rhythm with a narrow  QRS.  Intervals were 0.18, 0.09, 0.37.  There was poor R-wave  progression.   We have then submitted him for tread mill testing on modification of a  Naughton protocol.  His maximum heart rate was 80 at which time the test  was terminated because of exercise intolerance.  I should also note that  his maximum blood pressure actually fell at peak exercise.   IMPRESSION:  1. Ischemic heart disease.      a.     Status post coronary artery bypass graft.      b.     Ejection fraction of 25%.      c.     2+ MR.  2. Chronotropic incompetence, question  degree iatrogenic.  3. Impaired exercise tolerance -- progressive over recent months,      accompanied by some chest discomfort.  4. Orthostatic intolerance.   [name] [name], [name] [name] clearly meets criteria for her ICD implantation for  primary prevention.  He also has significant chronotropic incompetence  which may be caused by his chronotropic incompetence or by his  progressive LV dysfunction, the cause of which is not yet clear.   The treadmill testing clearly confirmed chronotropic incompetence and at  the time of ICD implantation I would recommend a dual chamber device  with rate response.   The other question that is begged, [name] [name], is the status of his coronary  arteries and are these potentially implicatable as contributing to both  his exercise intolerance and his LV deterioration.  I have  suggested  that it may be valuable to undertake catheterization to clarify this  prior to device implantation.   He is to see you next week, and I have asked him to review these issues  with you.   I look forward to talking with you about [name] [name].   Thank you for this consultation.    Sincerely,      Duke Salvia, MD, [facility]  Electronically Signed    SCK/MedQ  DD: 02/12/2007  DT: 02/12/2007  Job #: [id number]   CC:    Georgina Quint. Plotnikov, MD

## 2011-01-28 NOTE — Assessment & Plan Note (Signed)
[facility]                            CARDIOLOGY OFFICE NOTE   NAME:Steel, [name] [name]                         MRN:          [id number]  DATE:05/24/2008                            DOB:          Apr 14, 1929    HISTORY OF PRESENT ILLNESS:  [name] [name] is here for followup.  He has had  some dizziness over the time, and he does have severe LV dysfunction.  He has an ICD with pacing function in place.  He has significant  coronary artery disease.  In general, he has been stable.  However, over  the past 2 weeks, his dizziness has become a much bigger problem.  He  has had 2 syncopal episodes.  In both cases, he feels dizzy and then  finds himself on the floor.  He does not sense his actual falling.  He  has not injured himself.  We know that he has some vasomotor  instability.  These are impressive spells, and I am concerned about  them, and we will adjust his medicines and then see him back soon to be  sure how he responds.  He does have chronotropic incompetence, and his  pacemaker has been adjusted over the time.  It is also possible that we  need to adjust this further.  He has followup with Dr. Graciela Husbands within a  few weeks.   PAST MEDICAL HISTORY:   ALLERGIES:  MORPHINE.   MEDICATIONS:  1. Lipitor 40.  2. Atacand 16/12.5.  3. Aspirin 325.  4. Imdur 30.  5. Carvedilol 25 b.i.d.  6. Digoxin 0.125.  7. Multivitamin.  8. Spironolactone 25.  9. Vitamin D.   OTHER MEDICAL PROBLEMS:  See the complete list below.   REVIEW OF SYSTEMS:  Other than the HPI, he is doing well.  He is not  having any other significant symptoms.   PHYSICAL EXAMINATION:  VITAL SIGNS:  Blood pressure today is 98/60.  His  pressure has been on the low side in the past but with his current  symptomatology, we must adjust his meds down.  His pulse rate is 70 by  his pacemaker.  GENERAL:  The patient is oriented to person, time and place.  Affect is  normal.  HEENT:  No xanthelasma.   He has normal extraocular motion.  There are no  carotid bruits.  There is no jugular venous distention.  LUNGS:  Clear.  Respiratory effort is not labored.  CARDIAC:  Reveals an S1 with an S2.  There is a soft systolic murmur.  ABDOMEN:  Soft.  EXTREMITIES:  He has no significant peripheral edema.   PROBLEMS:  1. Severe coronary disease post coronary artery bypass graft and his      last cath was in 2006.  There was a patent vein to the ramus and      patent left internal mammary artery.  2. History of mild mitral regurgitation.  It is now time for followup      2-D echo, and this will be arranged.  In the past, his ejection  fraction was in the 25% range.  3. History of hypertension.  This is clearly not a problem now in that      he is relatively hypotensive at this time.  4. Hyperlipidemia.  He is on Lipitor 40.  5. Chronotropic incompetence that has been treated with a pacemaker.      He has a dual-chamber ICD in place, and this unit was placed in      July 2008.  6. History of some arthritis.  7. History of some dizzy spells.  We know that he has some vasomotor      dysfunction along with his bradycardia.  He had a CT scan of the      head in October 2008.  There was mild atrophy and no other      intracranial abnormality.  This was a CT without contrast.  8. Recent syncope.  Today, the patient's blood pressure remains on the      low side.  This would be optimal for him with his LV dysfunction if      he were not having symptoms.  However, he is having significant      symptoms.  I have decided to hold his Imdur and hold his      spironolactone and hold his Atacand hydrochlorothiazide.  This is a      major change in his medicines.  I will see him back early next week      to be sure that he is stable.  He will call us if he has any      problems.  I feel that with his significant syncopal spells,      however it is most important to get him off some of his meds that       to be sure that he does not have further syncope.  I have chosen      not to place an event recorder at this time.  He has the pacing      unit in place.  It is possible that he could be having tachy      arrhythmias but I doubt this is the case.  I will see him back next      week.     [name], MD, [facility]  Electronically Signed    JDK/MedQ  DD: 05/24/2008  DT: 05/24/2008  Job #: [id number]

## 2011-01-28 NOTE — Assessment & Plan Note (Signed)
[facility]                         ELECTROPHYSIOLOGY OFFICE NOTE   NAME:Junco, OTONIEL MYHAND                         MRN:          [id number]  DATE:06/30/2007                            DOB:          07/13/1929    SUBJECTIVE:  Mr. Blakeman comes in following ICD implantation.  He has  ischemic heart disease. He underwent CPX and was felt not to be  significantly limited and received a dual chamber device.  He does have  chronotropic incompetence and is atrial paced most of the time.   He describes spells.  These have been going on for the last month.  These are brief, lasting a minute or so.  There is some sensation of  spinning with them and when he closes his eyes there is some sensation  of scotomata.  These occur lying, sitting and standing.  They are not  provoked by changes in position nor are they relieved by becoming more  recumbent.  Otherwise, his breathing is relatively stable and improved.   MEDICATIONS:  His medications include Lipitor, Atacand, aspirin, Imdur,  carvedilol, digoxin and aldactone.   PHYSICAL EXAMINATION:  VITAL SIGNS:  His blood pressure is 110/59 with  pulse of 60.  LUNGS:  Clear.  HEART:  Sounds were regular.  EXTREMITIES:  Without edema.  HEENT:  There is no evidence of nystagmus.  NEUROLOGICAL:  His finger to nose was normal.   Interrogation of his St. Jude ICD demonstrates a P wave of 2.1 with  impedance of 530, threshold of 0.75 at 0.5 with R wave of 11.6 and  impedance of 460, threshold of 0.75 at 0.5.  __________ impedance was 69  ohms.  There were no intercurrent therapies.   IMPRESSION:  1. Ischemic heart disease with depressed left ventricular function.  2. Status post ICD for the above.  3. Chronotropic incompetence now with 100% atrial pacing.  4. New onset dizzy spells that are not positionally related.  5. Anemia.   PLAN:  We will plan to:  1. Check a CBC.  2. Electrolytes following the addition of the  aldactone.  3. Head CT given the neurological-sounding nature of these spells.  4. An event recorder to make sure there is not an underlying      arrhythmia although nothing has been detected within the detection      zones of his defibrillator.  5. I will plan to see him again in four to five weeks to review the      event recorder and in the      event that this is not diagnostic, I have asked they set up      followup for him to see Dr. Posey Rea.     Duke Salvia, MD, [facility]  Electronically Signed    SCK/MedQ  DD: 06/30/2007  DT: 07/01/2007  Job #: [id number]   cc:   Georgina Quint. Plotnikov, MD

## 2011-01-28 NOTE — Cardiovascular Report (Signed)
NAME:  [name] [name], [name] [name]                  ACCOUNT NO.:  [id number]   MEDICAL RECORD NO.:  [id number]          PATIENT TYPE:  OIB   LOCATION:  NA                           FACILITY:  [facility]   PHYSICIAN:  Bevelyn Buckles. Bensimhon, MDDATE OF BIRTH:  [birth year]   DATE OF PROCEDURE:  02/26/2007  DATE OF DISCHARGE:                            CARDIAC CATHETERIZATION   PRIMARY CARE PHYSICIAN:  Georgina Quint. Plotnikov, M.D.   CARDIOLOGIST:  [name], MD, [facility].   IDENTIFICATION:  [name] [name] is a very pleasant 77 year old male with known  ischemic cardiomyopathy.  Over the last year or so, he has had  significant decrease in his exercise tolerance, able to walk five miles  to now down to less than half a mile.  He does have occasional chest  discomfort as well.  He underwent a stress test which showed significant  chronotropic incompetence as well as decreased in as ejection fraction  from 35-40% to 25%.  He is now being considered for possible cardiac  resynchronization therapy.  Prior to this he is being referred for  diagnostic catheterization to make sure his revascularization is intact.  Catheterization was done in the outpatient catheterization lab.   PROCEDURES PERFORMED:  1. One right heart catheterization.  2. Selective coronary angiography.  3. The saphenous vein graft angiography.  4. Left internal mammary artery angiography.  5. Left heart catheterization.  6. Left ventriculogram.   DESCRIPTION OF PROCEDURE:  The risks and benefits of catheterization  were explained.  Consent was signed and placed on the chart.  A 4-French  arterial sheath was placed in the right femoral artery using a modified  Seldinger technique.  Standard catheters including JL-4, 3-D RCA and  angled pigtail used for the catheterization.  All catheter exchanges  made over wire.  There are no apparent complications.  The 7-French  venous sheath was placed in the right femoral vein using a modified  Seldinger  technique.  Standard Swan-Ganz catheter was used for the  procedure.  There are no apparent complications.   HEMODYNAMIC RESULTS:  Right atrium mean of 6, RV 50/1.  PA pressure  52/14 with a mean of 29.  Pulmonary capillary wedge pressure mean of 15.  Central aortic pressure was 148/63 with a mean of 695.  LV pressure was  149/70 with EDP of 23.  There was no aortic stenosis or mitral stenosis.  Fick cardiac output was 3.6 liters per minute.  Cardiac index 1.9 liters  per minute per meter squared.  Pulmonary vascular resistance was 3.9  Woods units.   CORONARY ANATOMY:  Left Main:  A angiographically normal.   LAD:  Gave off a large branching diagonal and a moderate-sized septal  perforator before being totally occluded in the mid section.  There is  no flow-limiting disease in the diagonal.   Left circumflex gave off the ramus branch that was occluded ostially.  There was a moderate-sized OM-1 and  OM-2 that were free of significant  disease.   Right coronary artery gave off a conus branch and was totally occluded  proximally.  There was faint bridging collaterals to fill a small  segment of the RCA in the mid portion.   The saphenous vein graft to the RCA was totally occluded which was  chronic.   The saphenous vein graft to the ramus was widely patent.  There was good  flow in the native ramus branch.   The LIMA to the LAD was widely patent with a good flow in the LAD  through the graft.  There were left-to-right collaterals from the distal  LAD filling the very distal RCA.   Left ventriculogram done in the RAO position showed ejection fraction  about 35% with apical aneurysm.  There is 1-2+ mitral regurgitation.   ASSESSMENT:  1. Severe native coronary disease as described above.  2. Two of three bypass grafts patent including the left internal      mammary artery to the left anterior descending and the saphenous      vein graft to the ramus.  There is a total occlusion  of the      saphenous vein graft to the right coronary artery which is chronic.  3. Moderately reduced LV function and ejection fraction approximately      35% with an apical aneurysm.  4. Mild pulmonary arterial hypertension with normal wedge pressure.  5. A 1-2+ mitral regurgitation.   PLAN/DISCUSSION:  Given that his revascularization is intact and filling  pressures are relatively normal, I think it is reasonable to proceed  with cardiopulmonary exercise test to clearly evaluate the magnitude of  his functional limitation as well as the etiology.  He will follow up  with Dr. Graciela Husbands and Dr. Myrtis Ser for further evaluation after the CPX test  and for consideration of possible BiV ICD.  Continue aggressive medical  therapy as he is currently well tuned.      Bevelyn Buckles. Bensimhon, MD  Electronically Signed     DRB/MEDQ  D:  02/26/2007  T:  02/27/2007  Job:  [id number]   cc:   Georgina Quint. Plotnikov, MD  [name], MD, [facility]

## 2011-01-31 NOTE — H&P (Signed)
[place]. [facility]  Patient:    [name] [name], [name] [name]                         MRN: [id number] Adm. Date:  [id number] Attending:  Lyn Records. Iii Dictator:   Anselm Lis, N.P.                         History and Physical  NOTE:  History and physical as performed by Dr. Verdis Prime.  DATE OF BIRTH:  October 04, 1928  PRIMARY CARE PHYSICIAN:  Dr. Merlene Laughter.  BRIEF HISTORY:  [name] [name] is a very pleasant 77 year old gentleman with a known history of coronary artery disease; status post CABG x 4 in 1982 with a subsequent PTCA/stent of native RCA in 1996.  He has known ischemic LV dysfunction with an EF of approximately 33% by recent stress cardiolite.  [name] [name] has been complaining of episodice lower substernal chest tightness/pressure lasting a minute or 2, rarely long enough to take a nitroglycerin tablet.  No associated shortness of breath.  Discomfort may or may not be exertion related.  He can exert himself without precipitation of discomfort but, interestingly, will have a development of hoarseness of his voice/loss of voice if he exerts himself for an extended period.  He was referred by primary care for a stress Cardiolite which was performed August 13, 2000, which revealed an EF of approximately 33% with anterior, septal and apical scar as well as an inferior scar with peri-infarct ischemic inferiorly.  He was counseled to undergo and accepted plans to undergo a coronary and bypass graft angiography with possible percutaneous intervention if indicated and able.  PAST MEDICAL HISTORY: 1. Coronary atherosclerotic heart disease.    a. (April 1992)  CABG x 4 by Dr. Ofilia Neas with LIMA to the LAD, SVG to       intermediate, sequential SVG to PDA and PL off RCA.    b. (December 1996)  Stent of the native RCA; native vessels, high grade       obstruction LAD, total occlusion RCA and total occlusion of the ramus       intermedius branch.  Bypass  graft occlusive disease with 90-95% stenosis       in the mid body of the graft to the RCA; graft to the LAD (LIMA) and a       saphenous vein graft to the ramus branch were widely patent.    c. August 13, 2000) A stress cardiolite revealing EF of 33% with       anterior, septal and apical scars, inferior scar with peri-infarct       ischemia. 2. LV dysfunction with EF approximately 35-40%.  Apical akinesis, anterior    hypokinesis, inferior wall mild hypokinesis. 3. Dyslipidemia; on lipitor management by primary care. 4. Prior surgery on his left leg status post trauma as a child. 5. A lumbar laminectomy in 1982.  The patient denies a history of hypertension, cancer nor asthma.  ALLERGIES:  MORPHINE causes hypotension, otherwise no problems.  Okay with seafood, shellfish, and iodinated products.  MEDICATIONS: 1. Lipitor 20 mg p.o. q.d. 2. Enteric coated aspirin 325 mg once daily.  SOCIAL HISTORY:  Tobacco:  Quit some 20 years ago.  Prior to this smoked for 25 years.  ETOH:  Negative.  [name] [name] was married for 49 years and has 4 grown children, a daughter and a  son whom have accompanied today.  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  His father interestingly worked for the Manufacturing systems engineer for [employer].  FAMILY HISTORY:  Father died at 82 of hemorrhagic stroke; no coronary artery disease.  Mother died at 53 with Alzheimers disease.  Four siblings, 2 of whom have hypertension, and 1 sister who had bypass graft surgery at the age of 41.  He has four children, alive and well.  REVIEW OF SYSTEMS:  As in HPI.  Otherwise denies problems with migraine headaches.  No loss of hearing.  Wears eyeglasses.  Negative dysphagia to food or fluid.  Dentition in poor shape but no pain.  Negative melena nor bright red blood per rectum.  Negative constipation nor diarrhea.  No dysuria nor hematuria.  Denies orthopnea, dyspnea on exertion nor paroxysmal  nocturnal dyspnea.  He does mention some dependent lower extremity edema.  PHYSICAL EXAMINATION:  VITAL SIGNS:  Blood pressure 136/76, heart rate is 67 and regular. Respiratory rate 18, temperature 97.4, height 5 feet 7 inches.  Weight 182 pounds.  GENERAL:  He is a well-nourished 77 year old male in no acute distress.  His wife, daughter, son, pastor and friends accompany him today.  HEENT:  Brisk bilateral carotid upstroke without bruits.  NECK:  Without JVD, no thyromegaly.  CARDIAC:  Regular rate and rhythm without murmur, rub nor gallop.  Normal S1, S2.  CHEST:  Sounds are clear though for some scant bibasilar crackles.  Negative CPA tenderness.  ABDOMEN:   Soft, nondistended, normoactive bowel sounds.  Negative abdominal aortic, renal, or femoral bruit.  Nontender to applied pressure; no masses nor organomegaly appreciated.  EXTREMITIES:  Plus 2/4 bilateral radial, femoral and dorsalis pedis pulses.  0 to 1/4 posterior tibial.  Negative pedal edema.  LABORATORY DATA:  Chest x-ray from August 21, 2000, revealed stable chest; no active disease.  Lab work from August 21, 2000, revealed a sodium of 140, K of 4.8, chloride of 102, CO2 29, BUN 14, creatinine 1.1, and glucose 93.  LFTs within normal range.  CBC:  Hemoglobin of 14.4, with a hematocrit of 42.5, WBC of 6.1 and platelets of 261.  Protime was 10.9 with an INR of 0.87, PTT of 26. Stress cardiolite from August 13, 2000, as above.  EKG from August 13, 2000; revealed a sinus rhythm at 67 beats per minute with evidence of old anterior/septal MI; inferior, anterior and lateral/precordial T wave inversions.  IMPRESSION: (As dictated per Dr. Verdis Prime).  51. A 77 year old gentleman with a known history of CAD status post CABG x 4, 9    years earlier.  He subsequently had a stent placed in his native RCA in    1996 because of high grade stenosis, saphenous vein graft to the right    coronary artery.  At that time  it was noted his native LIMA to LAD and the    saphenous vein graft to the ramus branch were widely patent.  He has been     having some symptoms of angina with followup recent stress cardiolite    revealing old anterior, septal and apical scar as well as inferior scar    with peri-infarct ischemia inferiorly. 2. Ischemic LV dysfunction with EF approximately 35-40% by recent cardiolite    with anterior, septal, apical and inferior scar and some peri-infarct    ischemia inferiorly. 3. Dyslipidemia; lipitor management by primary care.  PLAN:  (As dictated by Dr. Verdis Prime).  The patient  has been counselled to undergo and accepted plans for coronary angiography and bypass graft angiography with possible percutaneous intervention if indicated and able.  Risks, potential complications, benefits, and alternative procedures discussed in detail.  [name] [name] and his wife indicate their questions and concerns have been addressed and they are agreeable to proceed. DD:  08/25/00 TD:  08/25/00 Job: [id number]

## 2011-01-31 NOTE — Assessment & Plan Note (Signed)
[facility]                            CARDIOLOGY OFFICE NOTE   NAME:[name] [name], [name] [name]                         MRN:          [id number]  DATE:09/23/2006                            DOB:          1929-08-30    [name] [name] is here for follow-up.  He has now completed the Compare Trial  (comparing different dosing regimens of Coreg).  He is feeling very  well.  He felt even better on study drug.  He now will use 25 of  carvedilol b.i.d.  He is to have a follow-up final echo today after this  visit.  He is feeling well.  He is not having any chest pain.   ALLERGIES:  MORPHINE.   MEDICATIONS:  1. Lipitor 40 mg.  2. Atacand.  3. Hydrochlorothiazide.  4. Vitamins.  5. Ecotrin.  6. IMDUR 60 mg.  7. Today Coreg 25 mg b.i.d. (after the Compare study drug is      finished).   OTHER MEDICAL PROBLEMS:  See the list below.   REVIEW OF SYSTEMS:  He really has no significant complaints today and  his review of systems is negative.   PHYSICAL EXAMINATION:  VITAL SIGNS:  Blood pressure 122/72, with pulse  of 55, weight is 180 pounds.  GENERAL:  The patient is oriented to person, time, and place.  Affect is  normal.  There is no xanthelasma.  HEENT:  He has normal extraocular movements.  There are no carotid  bruits.  There is no jugular venous distention.  LUNGS:  Clear.  Respiratory effort is not labored.  HEART:  S1 with S2.  There are no clicks or significant murmurs.  ABDOMEN:  Soft.  He has no peripheral edema.  There is no evidence of  liver involvement.  There is no evidence of ascites.  There is no edema.   PROBLEM:  1. Severe coronary artery disease with CABG.  Catheterization in 2006      showed a patent vein graft to the ramus and a patent LIMA.  2. 2+ mitral regurgitation.  3. History of ejection fraction in the 35% range.  His follow-up echo      is to be done today.  4. Hypertension, treated.  5. Hyperlipidemia, treated.   He is doing well.  We  will wait for his echo follow-up.  I will plan to  see him back in 3 months to be sure that his overall status is stable.     [name], MD, [facility]  Electronically Signed    JDK/MedQ  DD: 09/23/2006  DT: 09/23/2006  Job #: [id number]   cc:   Georgina Quint. Plotnikov, MD

## 2011-01-31 NOTE — Assessment & Plan Note (Signed)
[facility]                            CARDIOLOGY OFFICE NOTE   NAME:[name] [name], [name] [name]                         MRN:          [id number]  DATE:12/30/2006                            DOB:          10/07/1928    [name] [name] is seen for followup.  I had seen him last in January of 2008.  He had completed the Compare Trial of comparing 2 different regimens of  Coreg.  He had felt better.  He feels okay now, but has fatigue later in  the day.  He had a research followup echo.  I have reviewed the images  today.  Patient has severe left ventricular dysfunction.  There is note  that his ejection fraction had been in the 35% range in the past.  I  believe it is closer to 25% from the images available in January of  2008.  He does not have any clinical heart failure.  He is not having  any chest pain.  He has had no syncope or presyncope.  He has had the  appropriate medications titrated.  He is on Atacand, aspirin, Imdur, and  carvedilol.  The carvedilol dose is 25 b.i.d.   PAST MEDICAL HISTORY:   ALLERGIES:  MORPHINE.   MEDICATIONS:  1. Lipitor 40.  2. Atacand/hydrochlorothiazide 16/12.5.  3. Multivitamin.  4. Aspirin 325.  5. Imdur 60.  6. Carvedilol at 25 b.i.d.   OTHER MEDICAL PROBLEMS:  See the list below.   REVIEW OF SYSTEMS:  His major complaint is that he feels well in the  morning, but is fatigued later in the day.  Otherwise, his review of  systems is negative.   PHYSICAL EXAMINATION:  Patient's blood pressure today is 118/68 with a  pulse of 65.  His weight is 180 pounds.  The patient is oriented to person, time, and place.  Affect is normal.  There is no xanthelasma.  There is normal extraocular motion.  There are no carotid bruits.  There is no jugular venous distention.  LUNGS:  Clear.  Respiratory effort is not labored.  CARDIAC EXAM:  Reveals an S1 with an S2.  There are no clicks or  significant murmurs.  ABDOMEN:  Soft.  There are no  masses or bruits.  There are normal bowel  sounds.  The patient has no significant peripheral edema.   No labs are done today.   PROBLEMS:  Include:  1. Severe coronary artery disease with a history of coronary artery      bypass graft.  Catheterization in 2006 showed a patent vein to the      ramus, and a patent LIMA.  2. History of 2+ mitral regurgitation.  3. History of ejection fraction previously listed in the range of 35%.      By recent echo, I believe it is closer to 25% at this time.  4. History of hypertension, treated.  5. Hyperlipidemia, being treated.  6. Afternoon fatigue.  I am not sure at this point that this is      cardiac in origin.   Because the  patient felt better early on, I had hoped that he would have  improvement in LV function.  It is clear now that this has not been the  case.  We need to adjust his meds further, and I also need to reassess  whether it is appropriate to proceed with consultation for possible  placement of an ICD.  We will start by adding digoxin.  BMET will be  checked first.  I will then see him back for followup.     [name], MD, [facility]  Electronically Signed    JDK/MedQ  DD: 12/30/2006  DT: 12/30/2006  Job #: [id number]   cc:   Georgina Quint. Plotnikov, MD

## 2011-01-31 NOTE — Assessment & Plan Note (Signed)
[facility]                              CARDIOLOGY OFFICE NOTE   NAME:[name] [name], [name] [name]                         MRN:          [id number]  DATE:06/01/2006                            DOB:          04-07-1929    [name] [name] is doing well.  He has coronary disease and significant left  ventricular dysfunction.  His Coreg is being titrated.  He is in the COMPARE  trial (comparing different dosing regimens of Coreg).  On the study drug, he  says he feels even better.  Of course, the study drug is blinded.   He is not having any significant chest pain or shortness of breath.  He has  mild edema.  This is intermittent and does not appear to be affecting him.  He feels that he is getting stronger overall.   PAST MEDICAL HISTORY:   ALLERGIES:  MORPHINE.   MEDICATIONS:  1. Lipitor 40.  2. Atacand hydrochlorothiazide.  3. Multivitamins.  4. Ecotrin.  5. Imdur 60.  6. COMPARE study drug (Coreg formulation).   MEDICAL PROBLEMS:  See the list below.   REVIEW OF SYSTEMS:  He is not having any significant GI or GU symptoms.  Overall, his review of systems is negative.   PHYSICAL EXAM:  VITAL SIGNS:  Weight today is 180, which is stable.  Blood  pressure is 122/73 with a pulse of 58.  GENERAL:  The patient is oriented to person, time, and place.  His affect is  normal.  LUNGS:  Clear.  Respiratory effort is not labored.  HEENT:  No xanthelasma.  He has normal extraocular motion.  NECK:  There are no carotid bruits.  There is no jugular venous distension.  CARDIAC:  Reveals an S1 with an S2.  There are no clicks or significant  murmurs.  ABDOMEN:  Soft.  There are no masses or bruits.  He does have trace/1+  peripheral edema, slightly worse in the left leg.  He has no major  musculoskeletal deformities.   No labs are done today.  He did have a COMPARE study visit also today.   PROBLEMS:  1. Severe coronary disease post coronary artery bypass graft  His  cath in      September of 2006 showed that he had a patent vein graft to the ramus,      patent left internal mammary artery to the left anterior descending.      At that time he did have 2 plus mitral regurgitation and ejection      fraction in the 35% range.  2. Ejection fraction in the 35% range.  It is now time to repeat his echo      to see how his left ventricular  function is doing.  I will check with      the research nurses to be sure that this timing is acceptable.  3. Two plus mitral regurgitation.  Will assess this also with an echo.  4. Hypertension, treated.  5. Hyperlipidemia treated.  6. Overall fatigue.  This is clearly improving.   [name] [name]  is doing well.  We will look into the timing of a follow-up 2D echo  and a return visit with me.                               [name], MD, [facility]    JDK/MedQ  DD:  06/01/2006  DT:  06/01/2006  Job #:  [id number]   cc:   Georgina Quint. Plotnikov, MD

## 2011-01-31 NOTE — Cardiovascular Report (Signed)
[place]. [facility]  Patient:    [name] [name], [name] [name]                         MRN: [id number] Proc. Date: 08/25/00 Adm. Date:  [id number] Attending:  Lyn Records. Iii CC:         Cardiac Catheterization Laboratory  Hal T. Stoneking, M.D.   Cardiac Catheterization  PROCEDURE: 1. Left heart catheterization. 2. Selective coronary angiography. 3. Left ventriculography. 4. Saphenous vein graft angiography. 5. Left internal mammary artery graft angiography.  CARDIOLOGIST:  Celso Sickle, M.D.  INDICATIONS:  The patient has a history of coronary atherosclerotic heart disease.  He is status post coronary artery bypass grafting in 1989.  He is status post stent to the graft to the right coronary artery in 1996.  Over the past seven months the patient has noted exertional chest tightness, although this symptom is not increased in intensity since it began.  DESCRIPTION OF PROCEDURE:  After informed consent, the patient was brought to the cardiac catheterization laboratory.  The right iliofemoral region was sterilely prepped and draped.  A 6-French sheath was placed in the right femoral artery using the modified Seldinger technique.  A 6-French A2 multipurpose catheter was used for hemodynamic recordings, left ventriculography, selective left and right coronary angiography, and a saphenous vein graft angiography.  An internal mammary artery catheter was used for left internal mammary artery angiography.  The patient tolerated the procedure without complications.  RESULTS: HEMODYNAMIC DATA: Aortic pressure:  125/64. Left ventricular pressure:  125/10 mmHg.  LEFT VENTRICULOGRAPHY:  The left ventricle demonstrates anteroapical akinesis/apical dyskinesis, and moderate to severe inferior wall hypokinesis. The anterobasal region demonstrates normal contractility.  The estimated ejection fraction is 35%.  No mitral regurgitation is noted.  SELECTIVE NATIVE  VESSEL CORONARY ANGIOGRAPHY: 1. Left main coronary artery:  The left main coronary artery is relatively    short, and is normal. 2. Left anterior descending coronary artery:  The left anterior descending    coronary artery has a 95% segmental stenosis beyond the first diagonal    and first septal perforator.  Distal competitive LAD flow is noted due    to the patency of a left internal mammary graft to the LAD. 3. Circumflex coronary artery:  The circumflex coronary artery is large.    The first obtuse marginal/ramus intermedius branch is totally occluded.    The circumflex gives origin to two obtuse marginal branches that are free    of any significant obstruction. 4. Right coronary artery:  The right coronary artery is totally occluded in    its midsegment.  There are right to right collaterals noted in the    midvessel bridging the total occlusion.  The distal flow is competitive. 5. Saphenous vein graft angiography:  The saphenous vein graft to the    ramus intermedius reveals that this graft is widely patent.  Only minimal    luminal irregularities are noted in the graft body. 6. Saphenous vein graft to the right coronary artery:  This is totally    occluded. 7. Left internal mammary artery graft to the left anterior    descending coronary artery:  This graft is widely patent.  Collaterals    supply the distal right coronary.  The native LAD beyond the graft    insertion site is widely patent, without any evidence of significant    obstruction. CONCLUSIONS: 1. Significant native vessel coronary artery disease  with 95%-99% proximal    left anterior descending coronary artery occlusion, total occlusion of    the first obtuse marginal/ramus intermedius, and total occlusion of the    mid-right coronary artery. 2. Total occlusion of the saphenous vein graft to the right coronary artery. 3. Patent saphenous vein graft to the first obtuse marginal/ramus intermedius,    and left internal  mammary artery to the left anterior descending coronary    artery is also patent. 4. Significant left ventricular dysfunction, with dyskinetic apex, and    severe inferior and inferobasal hypokinesis/akinesis.  The ejection    fraction is 35%. 5. The patients exertional angina is due to occlusion of the right coronary    artery.  The right coronary territory is collateralized.  PLAN:  The most appropriate therapy at this time is to continue medical therapy.   No opportunity for intervention exists at this time. DD:  08/25/00 TD:  08/25/00 Job: [id number]

## 2011-01-31 NOTE — Cardiovascular Report (Signed)
NAME:  [name] [name], [name] [name] NO.:  [id number]   MEDICAL RECORD NO.:  [id number]          PATIENT TYPE:  OIB   LOCATION:  2858                         FACILITY:  [facility]   PHYSICIAN:  Charlies Constable, M.D. [facility] DATE OF BIRTH:  06-06-1929   DATE OF PROCEDURE:  05/21/2005  DATE OF DISCHARGE:                              CARDIAC CATHETERIZATION   CLINICAL HISTORY:  Ms. Blais is 77 years old and had bypass surgery by Dr.  Tyrone Sage in 1992.  He had a stent placed in the vein graft to the right  coronary artery subsequent to that and at last catheterization in 2001 his  graft to the right coronary artery was occluded.  He recently switched from  [place] [facility] to our service for insurance reasons.  He had a  Cardiolite scan which showed an apical scar and an intra-apical scar with  some periscar ischemia and an ejection fraction 43%.  He also had some  symptoms of shortness of breath with exertion and a decision was made to  bring him in for evaluation with angiography.   PROCEDURE:  The procedure was performed via the right femoral artery using  arterial sheath and 6-French preformed coronary catheters.  A front wall  arterial puncture was performed and Omnipaque contrast was used.  We used a  LIMA catheter for injection of the LIMA graft.  The right femoral artery was  closed with AngioSeal at the end of the procedure.  The patient tolerated  the procedure well and left the laboratory in satisfactory condition.   RESULTS:  Left main coronary artery:  Free of significant disease.   Left anterior descending artery:  Gave rise to an early large diagonal  branch, a septal perforator, small diagonal branch, and then was completely  occluded.   Circumflex artery:  Gave rise to a ramus branch which was completely  occluded, a marginal branch, and a posterolateral branch.  Except for the  ramus branch the circumflex artery was free of major obstruction.   Right coronary artery:   Completely occluded proximally after a conus branch.  Distal right coronary artery filled by bridging collaterals and consisted of  a large right ventricular branch and a small AV branch which supplied a very  short posterior descending artery.   The saphenous vein graft to the ramus branch of the circumflex artery was  patent and functioned normally.   The saphenous vein graft to the distal right coronary artery was completely  occluded near its origin (old) and the LIMA graft to the LAD was patent and  functioned normally.   LEFT VENTRICULOGRAM:  The left ventriculogram performed in the RAO  projection showed akinesis of apex and hypokinesis of the inferobasal  segment.  There was 2+ or moderate mitral regurgitation.  The estimated  ejection fraction was 35%.   DISTAL AORTOGRAM:  A distal aortogram was performed which showed patent  renal arteries and no significant aorto-iliac obstruction.   The aortic pressure was 121/64 with a mean of 96 and left ventricular was  121/13.   CONCLUSIONS:  1.  Coronary artery disease status post prior coronary artery bypass graft      surgery 1992 and prior stenting of the vein graft to the right coronary      artery in 1996.  2.  Severe native vessel disease with total occlusion of the left anterior      descending artery, total occlusion of the ramus branch of the circumflex      artery, and total occlusion of the right coronary artery.  3.  Patent vein graft to the ramus branch of the circumflex artery, occluded      vein graft to the distal right coronary artery (old), and patent left      internal mammary artery graft to the left anterior descending.  4.  Apical wall akinesis and inferobasal wall hypokinesis with an estimated      ejection fraction of 35% and 2+ or moderate mitral regurgitation.   RECOMMENDATIONS:  The patient's coronary anatomy has not changed much.  He  does have moderately severe left ventricular dysfunction and some  of the  symptoms could be related to this and some of this could be related to  potential ischemia in the distribution of the right coronary artery.  Will  plan continued medical management.           ______________________________  Charlies Constable, M.D. [facility]     BB/MEDQ  D:  05/21/2005  T:  05/21/2005  Job:  [id number]   cc:   Willa Rough, M.D.  [address]  [address]  [place]  [place] [address]   Georgina Quint. Plotnikov, M.D. [facility]  [address]  [place]  [place] [address]   [facility]

## 2011-01-31 NOTE — H&P (Signed)
[place]. [facility]  Patient:    ARLANDO, LEISINGER                         MRN: [id number] Adm. Date:  [id number] Attending:  Lyn Records. Iii Dictator:   Anselm Lis, N.P. CC:         Hal T. Stoneking, M.D.   History and Physical  No dictation given. DD:  08/25/00 TD:  08/25/00 Job: [id number]

## 2011-02-28 ENCOUNTER — Encounter: Payer: Self-pay | Admitting: Cardiology

## 2011-04-17 ENCOUNTER — Ambulatory Visit (INDEPENDENT_AMBULATORY_CARE_PROVIDER_SITE_OTHER): Payer: Medicare Other | Admitting: *Deleted

## 2011-04-17 ENCOUNTER — Encounter: Payer: Self-pay | Admitting: Internal Medicine

## 2011-04-17 ENCOUNTER — Other Ambulatory Visit: Payer: Self-pay | Admitting: Internal Medicine

## 2011-04-17 DIAGNOSIS — Z95810 Presence of automatic (implantable) cardiac defibrillator: Secondary | ICD-10-CM

## 2011-04-17 DIAGNOSIS — I428 Other cardiomyopathies: Secondary | ICD-10-CM

## 2011-04-17 LAB — REMOTE ICD DEVICE
AL AMPLITUDE: 2.4 mv
AL IMPEDENCE ICD: 500 Ohm
BATTERY VOLTAGE: 2.68 V
DEV-0020ICD: NEGATIVE
HV IMPEDENCE: 71 Ohm
RV LEAD AMPLITUDE: 11.6 mv
TZAT-0001SLOWVT: 1
TZAT-0004SLOWVT: 8
TZAT-0013FASTVT: 1
TZAT-0018FASTVT: NEGATIVE
TZAT-0019SLOWVT: 7.5 V
TZAT-0020FASTVT: 1 ms
TZON-0003FASTVT: 300 ms
TZON-0005FASTVT: 6
TZON-0010FASTVT: 80 ms
TZON-0010SLOWVT: 80 ms
TZST-0001FASTVT: 2
TZST-0001FASTVT: 4
TZST-0001SLOWVT: 2
TZST-0001SLOWVT: 3
TZST-0003FASTVT: 36 J
TZST-0003FASTVT: 36 J
TZST-0003SLOWVT: 25 J
TZST-0003SLOWVT: 36 J
VENTRICULAR PACING ICD: 1 pct

## 2011-04-18 ENCOUNTER — Ambulatory Visit: Admitting: Cardiology

## 2011-04-21 ENCOUNTER — Encounter: Payer: Self-pay | Admitting: Cardiology

## 2011-04-21 DIAGNOSIS — I4589 Other specified conduction disorders: Secondary | ICD-10-CM | POA: Insufficient documentation

## 2011-04-21 DIAGNOSIS — R202 Paresthesia of skin: Secondary | ICD-10-CM | POA: Insufficient documentation

## 2011-04-21 DIAGNOSIS — Z951 Presence of aortocoronary bypass graft: Secondary | ICD-10-CM | POA: Insufficient documentation

## 2011-04-21 DIAGNOSIS — I255 Ischemic cardiomyopathy: Secondary | ICD-10-CM | POA: Insufficient documentation

## 2011-04-21 DIAGNOSIS — I340 Nonrheumatic mitral (valve) insufficiency: Secondary | ICD-10-CM | POA: Insufficient documentation

## 2011-04-21 DIAGNOSIS — R55 Syncope and collapse: Secondary | ICD-10-CM | POA: Insufficient documentation

## 2011-04-21 DIAGNOSIS — R200 Anesthesia of skin: Secondary | ICD-10-CM | POA: Insufficient documentation

## 2011-04-22 ENCOUNTER — Encounter: Payer: Self-pay | Admitting: Cardiology

## 2011-04-22 ENCOUNTER — Ambulatory Visit (INDEPENDENT_AMBULATORY_CARE_PROVIDER_SITE_OTHER): Payer: Medicare Other | Admitting: Cardiology

## 2011-04-22 DIAGNOSIS — R55 Syncope and collapse: Secondary | ICD-10-CM

## 2011-04-22 DIAGNOSIS — Z95810 Presence of automatic (implantable) cardiac defibrillator: Secondary | ICD-10-CM

## 2011-04-22 DIAGNOSIS — F4321 Adjustment disorder with depressed mood: Secondary | ICD-10-CM

## 2011-04-22 DIAGNOSIS — R05 Cough: Secondary | ICD-10-CM

## 2011-04-22 DIAGNOSIS — I2510 Atherosclerotic heart disease of native coronary artery without angina pectoris: Secondary | ICD-10-CM

## 2011-04-22 DIAGNOSIS — I509 Heart failure, unspecified: Secondary | ICD-10-CM

## 2011-04-22 DIAGNOSIS — I10 Essential (primary) hypertension: Secondary | ICD-10-CM

## 2011-04-22 NOTE — Patient Instructions (Signed)
Your physician recommends that you schedule a follow-up appointment in: 6 months  

## 2011-04-22 NOTE — Assessment & Plan Note (Signed)
Patient has improved relative to this problem.  He had a good visit with his son in [place].

## 2011-04-22 NOTE — Progress Notes (Signed)
HPI Patient is seen for followup of cardiomyopathy.  He is doing very well.  He traveled to [place] for a week this summer to see his son who lives there.  He is not having any chest pain or shortness of breath.  There's been no syncope or presyncope.  As part of evaluation today I have reviewed the old records and completely updated this electronic medical record.. Allergies  Allergen Reactions  . Morphine   . Ramipril     REACTION: cough    Current Outpatient Prescriptions  Medication Sig Dispense Refill  . aspirin 325 MG tablet Take 325 mg by mouth daily.        Marland Kitchen atorvastatin (LIPITOR) 40 MG tablet Take 40 mg by mouth daily.        . carvedilol (COREG) 25 MG tablet Take 25 mg by mouth 2 (two) times daily.        . Cholecalciferol 1000 UNITS tablet Take 1,000 Units by mouth daily.        . digoxin (LANOXIN) 0.125 MG tablet Take 125 mcg by mouth daily.        . nitroGLYCERIN (NITROLINGUAL) 0.4 MG/SPRAY spray Place 1 spray under the tongue as needed.        . ramipril (ALTACE) 2.5 MG capsule Take 2.5 mg by mouth daily.          History   Social History  . Marital Status: Widowed    Spouse Name: N/A    Number of Children: N/A  . Years of Education: N/A   Occupational History  . Retired    Social History Main Topics  . Smoking status: Former Games developer  . Smokeless tobacco: Not on file  . Alcohol Use: No  . Drug Use: Not on file  . Sexually Active: Not on file   Other Topics Concern  . Not on file   Social History Narrative   Widowed - 06/2010    Family History  Problem Relation Age of Onset  . Hypertension Other   . Colon cancer Other     1st degree relative <50  . Hypertension Mother   . Hypertension Father     Past Medical History  Diagnosis Date  . History of colon polyps   . Hyperlipidemia   . Hypertension   . 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 S/P CABG (coronary artery bypass graft) 1992  . ICD (implantable cardiac defibrillator) in place 03/2007    July, 2008  . Chronotropic incompetence     treated w/pacemaker, ICD 7.2008. This was placed after CPX done post 2008 cath. CPX showed only chronotropic incompetence  . Arrhythmia   . CHF (congestive heart failure)   . Syncopal episodes     relative hypotension  . Mitral regurgitation     mild  . Numbness and tingling of left arm and leg     improved after tx w/prednisone  . Hx of CABG     1992  . Grief reaction     Patient was a son in weight in 2001    Past Surgical History  Procedure Date  . Coronary artery bypass graft 1992  . Cardiac catheterization   . Cardiac defibrillator placement 2008    ROS  Patient denies fever, chills, headache, sweats, rash, change in vision, change  in hearing, chest pain, cough, nausea vomiting, urinary symptoms.  All other systems are reviewed and are negative.  PHYSICAL EXAM Patient is stable.  He is oriented to person time and place.  Affect is normal.  Head is atraumatic.  There is no xanthelasma.  Lungs are clear.  Respiratory effort is unlabored.  Cardiac exam reveals S1-S2.  No clicks or significant murmurs.  The abdomen is soft.  No peripheral edema. Filed Vitals:   04/22/11 0951  BP: 122/69  Pulse: 71  Resp: 14  Height: 5\' 7"  (1.702 m)  Weight: 170 lb (77.111 kg)    EKG Is done today and reviewed by me. There is decreased anterior R wave progression.  There are nonspecific ST-T wave changes.  The tracing is compared to the tracing of August, 2011.  There is no significant change. ASSESSMENT & PLAN

## 2011-04-22 NOTE — Assessment & Plan Note (Signed)
The patient's cardiomyopathy is stable.  He is under good control.  There no signs of heart failure.  I had considered increasing his ACE inhibitor dose.  However he had problems with syncope with lower blood pressures before.  Therefore there'll be no change in his therapy.

## 2011-04-22 NOTE — Assessment & Plan Note (Signed)
ICD is in place and stable.  No further workup.

## 2011-04-22 NOTE — Assessment & Plan Note (Signed)
He has not had any recurrent syncope.

## 2011-04-22 NOTE — Assessment & Plan Note (Signed)
He is not having any significant cough.  No further workup.

## 2011-04-22 NOTE — Assessment & Plan Note (Signed)
Blood pressure is under good control.  There is some room to adjust his meds further for his CHF.

## 2011-04-22 NOTE — Assessment & Plan Note (Signed)
Coronary disease is stable. No further workup is needed at this time. 

## 2011-04-23 NOTE — Progress Notes (Signed)
icd remote check  

## 2011-04-28 ENCOUNTER — Ambulatory Visit (INDEPENDENT_AMBULATORY_CARE_PROVIDER_SITE_OTHER): Payer: Medicare Other | Admitting: Internal Medicine

## 2011-04-28 ENCOUNTER — Encounter: Payer: Self-pay | Admitting: Internal Medicine

## 2011-04-28 ENCOUNTER — Other Ambulatory Visit (INDEPENDENT_AMBULATORY_CARE_PROVIDER_SITE_OTHER): Payer: Medicare Other

## 2011-04-28 ENCOUNTER — Other Ambulatory Visit: Payer: Self-pay | Admitting: Internal Medicine

## 2011-04-28 DIAGNOSIS — E785 Hyperlipidemia, unspecified: Secondary | ICD-10-CM

## 2011-04-28 DIAGNOSIS — F4320 Adjustment disorder, unspecified: Secondary | ICD-10-CM

## 2011-04-28 DIAGNOSIS — F4321 Adjustment disorder with depressed mood: Secondary | ICD-10-CM

## 2011-04-28 DIAGNOSIS — I10 Essential (primary) hypertension: Secondary | ICD-10-CM

## 2011-04-28 DIAGNOSIS — I2510 Atherosclerotic heart disease of native coronary artery without angina pectoris: Secondary | ICD-10-CM

## 2011-04-28 LAB — COMPREHENSIVE METABOLIC PANEL
ALT: 29 U/L (ref 0–53)
AST: 22 U/L (ref 0–37)
Albumin: 4.1 g/dL (ref 3.5–5.2)
Alkaline Phosphatase: 80 U/L (ref 39–117)
Calcium: 9 mg/dL (ref 8.4–10.5)
Chloride: 102 mEq/L (ref 96–112)
Potassium: 4 mEq/L (ref 3.5–5.1)
Sodium: 137 mEq/L (ref 135–145)
Total Protein: 7.3 g/dL (ref 6.0–8.3)

## 2011-04-28 LAB — LIPID PANEL
HDL: 37 mg/dL — ABNORMAL LOW (ref >39.00)
VLDL: 60.4 mg/dL — ABNORMAL HIGH (ref 0.0–40.0)

## 2011-04-28 NOTE — Assessment & Plan Note (Signed)
Better now 

## 2011-04-28 NOTE — Assessment & Plan Note (Signed)
On Rx 

## 2011-04-28 NOTE — Assessment & Plan Note (Signed)
On Rx. No angina

## 2011-04-28 NOTE — Assessment & Plan Note (Signed)
Controlled w/Rx 

## 2011-04-28 NOTE — Progress Notes (Signed)
  Subjective:    Patient ID: [name] [name], male    DOB: [birth year], 77 y.o.   MRN: [id number]  HPI  The patient presents for a follow-up of  chronic hypertension, chronic dyslipidemia, CAD controlled with medicines He just visited [place]   Review of Systems  Constitutional: Negative for appetite change, fatigue and unexpected weight change.  HENT: Negative for nosebleeds, congestion, sore throat, sneezing, trouble swallowing and neck pain.   Eyes: Negative for itching and visual disturbance.  Respiratory: Negative for cough.   Cardiovascular: Negative for chest pain, palpitations and leg swelling.  Gastrointestinal: Negative for nausea, diarrhea, blood in stool and abdominal distention.  Genitourinary: Negative for frequency and hematuria.  Musculoskeletal: Negative for back pain, joint swelling and gait problem.  Skin: Negative for rash.  Neurological: Negative for dizziness, tremors, speech difficulty and weakness.  Psychiatric/Behavioral: Negative for sleep disturbance, dysphoric mood and agitation. The patient is not nervous/anxious.        Objective:   Physical Exam  Constitutional: He is oriented to person, place, and time. He appears well-developed.  HENT:  Mouth/Throat: Oropharynx is clear and moist.  Eyes: Conjunctivae are normal. Pupils are equal, round, and reactive to light.  Neck: Normal range of motion. No JVD present. No thyromegaly present.  Cardiovascular: Normal rate, regular rhythm, normal heart sounds and intact distal pulses.  Exam reveals no gallop and no friction rub.   No murmur heard. Pulmonary/Chest: Effort normal and breath sounds normal. No respiratory distress. He has no wheezes. He has no rales. He exhibits no tenderness.  Abdominal: Soft. Bowel sounds are normal. He exhibits no distension and no mass. There is no tenderness. There is no rebound and no guarding.  Musculoskeletal: Normal range of motion. He exhibits no edema and no tenderness.    Lymphadenopathy:    He has no cervical adenopathy.  Neurological: He is alert and oriented to person, place, and time. He has normal reflexes. No cranial nerve deficit. He exhibits normal muscle tone. Coordination normal.  Skin: Skin is warm and dry. No rash noted.  Psychiatric: He has a normal mood and affect. His behavior is normal. Judgment and thought content normal.       Doing well now          Assessment & Plan:

## 2011-04-28 NOTE — Patient Instructions (Signed)
Stay well!

## 2011-04-29 ENCOUNTER — Telehealth: Payer: Self-pay | Admitting: Internal Medicine

## 2011-04-29 NOTE — Telephone Encounter (Signed)
Stacey, please, inform patient that all labs are OK Thx   

## 2011-04-30 NOTE — Telephone Encounter (Signed)
Pt informed

## 2011-05-07 ENCOUNTER — Encounter: Payer: Self-pay | Admitting: *Deleted

## 2011-06-27 ENCOUNTER — Ambulatory Visit (INDEPENDENT_AMBULATORY_CARE_PROVIDER_SITE_OTHER): Payer: Medicare Other | Admitting: Endocrinology

## 2011-06-27 ENCOUNTER — Encounter: Payer: Self-pay | Admitting: Endocrinology

## 2011-06-27 DIAGNOSIS — R05 Cough: Secondary | ICD-10-CM

## 2011-06-27 MED ORDER — PROMETHAZINE-CODEINE 6.25-10 MG/5ML PO SYRP: 5.0000 mL | ORAL_SOLUTION | ORAL | Status: DC | PRN

## 2011-06-27 MED ORDER — PROMETHAZINE-DM 6.25-15 MG/5ML PO SYRP: 5.0000 mL | ORAL_SOLUTION | Freq: Four times a day (QID) | ORAL | Status: AC | PRN

## 2011-06-27 NOTE — Patient Instructions (Addendum)
i have sent a prescription to your pharmacy, for cough medicine. I hope you feel better soon.  If you don't feel better by next week, please call dr plotnikov.

## 2011-06-27 NOTE — Progress Notes (Signed)
Subjective:    Patient ID: [name] [name], male    DOB: [birth year], 77 y.o.   MRN: [id number]  HPI Pt states 1 week of moderate myalgias, throughout the body, but worst at the legs.  He has assoc dry-quality cough. Past Medical History  Diagnosis Date  . History of colon polyps   . Hyperlipidemia   . Hypertension   . 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 S/P CABG (coronary artery bypass graft) 1992  . ICD (implantable cardiac defibrillator) in place 03/2007    July, 2008  . Chronotropic incompetence     treated w/pacemaker, ICD 7.2008. This was placed after CPX done post 2008 cath. CPX showed only chronotropic incompetence  . Arrhythmia   . CHF (congestive heart failure)   . Syncopal episodes     relative hypotension  . Mitral regurgitation     mild  . Numbness and tingling of left arm and leg     improved after tx w/prednisone  . Hx of CABG     1992  . Grief reaction     Patient was a son in weight in 2001    Past Surgical History  Procedure Date  . Coronary artery bypass graft 1992  . Cardiac catheterization   . Cardiac defibrillator placement 2008    History   Social History  . Marital Status: Widowed    Spouse Name: N/A    Number of Children: N/A  . Years of Education: N/A   Occupational History  . Retired    Social History Main Topics  . Smoking status: Former Games developer  . Smokeless tobacco: Not on file  . Alcohol Use: No  . Drug Use: No  . Sexually Active: Not Currently   Other Topics Concern  . Not on file   Social History Narrative   Widowed - 06/2010    Current Outpatient Prescriptions on File Prior to Visit  Medication Sig Dispense Refill  . aspirin 325 MG tablet Take 325 mg by mouth daily.        Marland Kitchen atorvastatin (LIPITOR) 40 MG tablet Take 40 mg by mouth daily.         . carvedilol (COREG) 25 MG tablet Take 25 mg by mouth 2 (two) times daily.        . Cholecalciferol 1000 UNITS tablet Take 1,000 Units by mouth daily.        . nitroGLYCERIN (NITROLINGUAL) 0.4 MG/SPRAY spray Place 1 spray under the tongue as needed.        . ramipril (ALTACE) 2.5 MG capsule Take 2.5 mg by mouth daily.        . digoxin (LANOXIN) 0.125 MG tablet Take 125 mcg by mouth daily.          Allergies  Allergen Reactions  . Morphine     "increased white corpuscles"  . Ramipril     REACTION: cough    Family History  Problem Relation Age of Onset  . Hypertension Other   . Colon cancer Other     1st degree relative <50  . Hypertension Mother   . Hypertension Father     BP 120/56  Pulse 70  Temp(Src) 98.2 F (36.8 C) (Oral)  Ht 5\' 7"  (1.702 m)  Wt 177 lb (80.287 kg)  BMI 27.72 kg/m2  SpO2  96%    Review of Systems Denies fever, n/v and sob.    Objective:   Physical Exam VITAL SIGNS:  See vs page GENERAL: no distress head: no deformity eyes: no periorbital swelling, no proptosis external nose and ears are normal mouth: no lesion seen Both eac's and tm's are normal NECK: There is no palpable thyroid enlargement.  No thyroid nodule is palpable.  No palpable lymphadenopathy at the anterior neck. LUNGS:  Clear to auscultation.       Assessment & Plan:  Cough, due to viral illness

## 2011-07-03 LAB — POCT I-STAT 3, VENOUS BLOOD GAS (G3P V)
O2 Saturation: 57
O2 Saturation: 60
TCO2: 26
TCO2: 27
pCO2, Ven: 44.4 — ABNORMAL LOW
pO2, Ven: 32

## 2011-07-03 LAB — POCT I-STAT 3, ART BLOOD GAS (G3+)
Acid-base deficit: 3 — ABNORMAL HIGH
Bicarbonate: 21.8
O2 Saturation: 93
TCO2: 23
pO2, Arterial: 68 — ABNORMAL LOW

## 2011-07-07 ENCOUNTER — Ambulatory Visit: Payer: Medicare Other | Admitting: Internal Medicine

## 2011-07-21 ENCOUNTER — Encounter: Payer: Self-pay | Admitting: Internal Medicine

## 2011-07-21 ENCOUNTER — Ambulatory Visit (INDEPENDENT_AMBULATORY_CARE_PROVIDER_SITE_OTHER): Payer: Medicare Other | Admitting: Internal Medicine

## 2011-07-21 DIAGNOSIS — I4589 Other specified conduction disorders: Secondary | ICD-10-CM

## 2011-07-21 DIAGNOSIS — I2510 Atherosclerotic heart disease of native coronary artery without angina pectoris: Secondary | ICD-10-CM

## 2011-07-21 DIAGNOSIS — I428 Other cardiomyopathies: Secondary | ICD-10-CM

## 2011-07-21 DIAGNOSIS — Z95810 Presence of automatic (implantable) cardiac defibrillator: Secondary | ICD-10-CM

## 2011-07-21 DIAGNOSIS — I509 Heart failure, unspecified: Secondary | ICD-10-CM

## 2011-07-21 LAB — ICD DEVICE OBSERVATION
AL IMPEDENCE ICD: 500 Ohm
ATRIAL PACING ICD: 91 pct
BAMS-0001: 180 {beats}/min
BAMS-0003: 70 {beats}/min
CHARGE TIME: 13.3 s
DEVICE MODEL ICD: 449147
FVT: 0
HV IMPEDENCE: 62 Ohm
RV LEAD IMPEDENCE ICD: 450 Ohm
TOT-0006: 20080711000000
TOT-0007: 4
TOT-0008: 0
TOT-0009: 1
TZAT-0001SLOWVT: 1
TZAT-0004FASTVT: 8
TZAT-0004SLOWVT: 8
TZAT-0012FASTVT: 200 ms
TZAT-0012SLOWVT: 200 ms
TZAT-0013FASTVT: 1
TZAT-0019FASTVT: 7.5 V
TZAT-0019SLOWVT: 7.5 V
TZAT-0020FASTVT: 1 ms
TZON-0003FASTVT: 300 ms
TZON-0004FASTVT: 12
TZON-0004SLOWVT: 12
TZON-0005FASTVT: 6
TZON-0010SLOWVT: 80 ms
TZST-0001FASTVT: 3
TZST-0001FASTVT: 4
TZST-0001FASTVT: 5
TZST-0001SLOWVT: 3
TZST-0001SLOWVT: 5
TZST-0003FASTVT: 36 J
TZST-0003FASTVT: 36 J
TZST-0003SLOWVT: 25 J
TZST-0003SLOWVT: 36 J

## 2011-07-21 LAB — BASIC METABOLIC PANEL
BUN: 17 mg/dL (ref 6–23)
Calcium: 9.3 mg/dL (ref 8.4–10.5)
Creatinine, Ser: 1.3 mg/dL (ref 0.4–1.5)
GFR: 56.47 mL/min — ABNORMAL LOW (ref >60.00)
Glucose, Bld: 108 mg/dL — ABNORMAL HIGH (ref 70–99)
Potassium: 4.2 mEq/L (ref 3.5–5.1)

## 2011-07-21 NOTE — Assessment & Plan Note (Signed)
90% atrially paced; perhaps this can be mitigated by the discontinuation of Lanoxin. Will review with Dr. Myrtis Ser

## 2011-07-21 NOTE — Progress Notes (Signed)
  HPI  [name] [name] is a 77 y.o. male s seen in followup for an ICD implanted for primary prevention in the setting of ischemic heart disease.  The patient denies chest pain, shortness of breath, nocturnal dyspnea, orthopnea or peripheral edema.  There have been no palpitations, lightheadedness or syncope.    He recently went to see his son who lives in [place]. He is working around the house. He is getting out and 59 with friends.    Past Medical History  Diagnosis Date  . History of colon polyps   . Hyperlipidemia   . Hypertension   . 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 S/P CABG (coronary artery bypass graft) 1992  . ICD (implantable cardiac defibrillator) in place 03/2007    July, 2008  . Chronotropic incompetence     treated w/pacemaker, ICD 7.2008. This was placed after CPX done post 2008 cath. CPX showed only chronotropic incompetence  . Arrhythmia   . CHF (congestive heart failure)   . Syncopal episodes     relative hypotension  . Mitral regurgitation     mild  . Numbness and tingling of left arm and leg     improved after tx w/prednisone  . Hx of CABG     1992  . Grief reaction     Patient was a son in weight in 2001    Past Surgical History  Procedure Date  . Coronary artery bypass graft 1992  . Cardiac catheterization   . Cardiac defibrillator placement 2008    Current Outpatient Prescriptions  Medication Sig Dispense Refill  . aspirin 325 MG tablet Take 325 mg by mouth daily.        Marland Kitchen atorvastatin (LIPITOR) 40 MG tablet Take 40 mg by mouth daily.        . carvedilol (COREG) 25 MG tablet Take 25 mg by mouth 2 (two) times daily.        . Cholecalciferol 1000 UNITS tablet Take 1,000 Units by mouth daily.        . digoxin (LANOXIN) 0.125 MG tablet Take 125 mcg by mouth daily.         . nitroGLYCERIN (NITROLINGUAL) 0.4 MG/SPRAY spray Place 1 spray under the tongue as needed.        . ramipril (ALTACE) 2.5 MG capsule Take 2.5 mg by mouth daily.          Allergies  Allergen Reactions  . Morphine     "increased white corpuscles"  . Ramipril     REACTION: cough    Review of Systems negative except from HPI and PMH  Physical Exam Well developed and well nourished in no acute distress HENT normal E scleral and icterus clear Neck Supple JVP flat; carotids brisk and full Clear to ausculation Regular rate and rhythm, S4 Soft with active bowel sounds No clubbing cyanosis and edema Alert and oriented, grossly normal motor and sensory function Skin Warm and Dry    Assessment and  Plan

## 2011-07-21 NOTE — Assessment & Plan Note (Signed)
The patient's device was interrogated.  The information was reviewed. No changes were made in the programming.    

## 2011-07-21 NOTE — Patient Instructions (Addendum)
Remote monitoring is used to monitor your Pacemaker of ICD from home. This monitoring reduces the number of office visits required to check your device to one time per year. It allows [place] to keep an eye on the functioning of your device to ensure it is working properly. You are scheduled for a device check from home on 10/23/11. You may send your transmission at any time that day. If you have a wireless device, the transmission will be sent automatically. After your physician reviews your transmission, you will receive a postcard with your next transmission date.   Your physician wants you to follow-up in: 1 year with Dr. Graciela Husbands. You will receive a reminder letter in the mail two months in advance. If you don't receive a letter, please call our office to schedule the follow-up appointment.   Your physician recommends that you continue on your current medications as directed. Please refer to the Current Medication list given to you today.   Your physician recommends that you have lab work today: bmp/digoxin

## 2011-07-21 NOTE — Assessment & Plan Note (Signed)
Stable on current meds 

## 2011-07-21 NOTE — Assessment & Plan Note (Signed)
Recheck metabolic profile 

## 2011-07-21 NOTE — Assessment & Plan Note (Signed)
Currently on digoxin. He had grade 3 renal insufficiency when checked in August. We will recheck his metabolic profile and check his digoxin level

## 2011-07-22 LAB — DIGOXIN LEVEL: Digoxin Level: 0.9 ng/mL (ref 0.8–2.0)

## 2011-07-31 ENCOUNTER — Ambulatory Visit (INDEPENDENT_AMBULATORY_CARE_PROVIDER_SITE_OTHER): Payer: Medicare Other | Admitting: Internal Medicine

## 2011-07-31 ENCOUNTER — Encounter: Payer: Self-pay | Admitting: Internal Medicine

## 2011-07-31 VITALS — BP 120/60 | HR 84 | Temp 98.0°F | Resp 16 | Wt 175.0 lb

## 2011-07-31 DIAGNOSIS — I10 Essential (primary) hypertension: Secondary | ICD-10-CM

## 2011-07-31 DIAGNOSIS — I2510 Atherosclerotic heart disease of native coronary artery without angina pectoris: Secondary | ICD-10-CM

## 2011-07-31 DIAGNOSIS — E785 Hyperlipidemia, unspecified: Secondary | ICD-10-CM

## 2011-07-31 DIAGNOSIS — I509 Heart failure, unspecified: Secondary | ICD-10-CM

## 2011-07-31 DIAGNOSIS — Z95810 Presence of automatic (implantable) cardiac defibrillator: Secondary | ICD-10-CM

## 2011-07-31 NOTE — Assessment & Plan Note (Signed)
On Lipitor 

## 2011-07-31 NOTE — Assessment & Plan Note (Signed)
July, 2008 Doing well

## 2011-07-31 NOTE — Assessment & Plan Note (Signed)
Continue with current prescription therapy as reflected on the Med list.  

## 2011-07-31 NOTE — Assessment & Plan Note (Signed)
Compensated. Continue with current prescription therapy as reflected on the Med list.  

## 2011-07-31 NOTE — Assessment & Plan Note (Signed)
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

## 2011-07-31 NOTE — Progress Notes (Signed)
  Subjective:    Patient ID: [name] [name], male    DOB: 05/07/1929, 77 y.o.   MRN: [id number]  HPI  The patient presents for a follow-up of  chronic hypertension, chronic dyslipidemia, CHF controlled with medicines    Review of Systems  Constitutional: Negative for appetite change, fatigue and unexpected weight change.  HENT: Negative for nosebleeds, congestion, sore throat, sneezing, trouble swallowing and neck pain.   Eyes: Negative for itching and visual disturbance.  Respiratory: Negative for cough.   Cardiovascular: Negative for chest pain, palpitations and leg swelling.  Gastrointestinal: Negative for nausea, diarrhea, blood in stool and abdominal distention.  Genitourinary: Negative for frequency and hematuria.  Musculoskeletal: Negative for back pain, joint swelling and gait problem.  Skin: Negative for rash.  Neurological: Negative for dizziness, tremors, speech difficulty and weakness.  Psychiatric/Behavioral: Negative for sleep disturbance, dysphoric mood and agitation. The patient is not nervous/anxious.        Objective:   Physical Exam  Constitutional: He is oriented to person, place, and time. He appears well-developed.  HENT:  Mouth/Throat: Oropharynx is clear and moist.  Eyes: Conjunctivae are normal. Pupils are equal, round, and reactive to light.  Neck: Normal range of motion. No JVD present. No thyromegaly present.  Cardiovascular: Normal rate, regular rhythm, normal heart sounds and intact distal pulses.  Exam reveals no gallop and no friction rub.   No murmur heard. Pulmonary/Chest: Effort normal and breath sounds normal. No respiratory distress. He has no wheezes. He has no rales. He exhibits no tenderness.  Abdominal: Soft. Bowel sounds are normal. He exhibits no distension and no mass. There is no tenderness. There is no rebound and no guarding.  Musculoskeletal: Normal range of motion. He exhibits no edema and no tenderness.  Lymphadenopathy:    He has  no cervical adenopathy.  Neurological: He is alert and oriented to person, place, and time. He has normal reflexes. No cranial nerve deficit. He exhibits normal muscle tone. Coordination normal.  Skin: Skin is warm and dry. No rash noted.  Psychiatric: He has a normal mood and affect. His behavior is normal. Judgment and thought content normal.     Lab Results  Component Value Date   WBC 6.7 12/27/2010   HGB 14.3 12/27/2010   HCT 41.9 12/27/2010   PLT 216.0 12/27/2010   GLUCOSE 108* 07/21/2011   CHOL 130 04/28/2011   TRIG 302.0* 04/28/2011   HDL 37.00* 04/28/2011   LDLDIRECT 50.1 04/28/2011   LDLCALC 65 09/24/2010   ALT 29 04/28/2011   AST 22 04/28/2011   NA 140 07/21/2011   K 4.2 07/21/2011   CL 103 07/21/2011   CREATININE 1.3 07/21/2011   BUN 17 07/21/2011   CO2 27 07/21/2011   TSH 2.05 12/27/2010   PSA 1.83 09/24/2010   INR 0.9 RATIO 03/24/2007   HGBA1C 6.2 02/13/2010        Assessment & Plan:

## 2011-09-04 ENCOUNTER — Encounter: Payer: Self-pay | Admitting: Internal Medicine

## 2011-09-11 ENCOUNTER — Telehealth: Payer: Self-pay | Admitting: Cardiology

## 2011-09-11 NOTE — Telephone Encounter (Signed)
Walk in Pt Form " Pt Dropped Off 4 [employer]" sent to Debby L  09/11/11/km

## 2011-10-20 ENCOUNTER — Encounter: Payer: Self-pay | Admitting: Cardiology

## 2011-10-20 ENCOUNTER — Ambulatory Visit (INDEPENDENT_AMBULATORY_CARE_PROVIDER_SITE_OTHER): Payer: Medicare Other | Admitting: Cardiology

## 2011-10-20 DIAGNOSIS — I2510 Atherosclerotic heart disease of native coronary artery without angina pectoris: Secondary | ICD-10-CM

## 2011-10-20 DIAGNOSIS — I509 Heart failure, unspecified: Secondary | ICD-10-CM

## 2011-10-20 DIAGNOSIS — R55 Syncope and collapse: Secondary | ICD-10-CM

## 2011-10-20 DIAGNOSIS — Z95810 Presence of automatic (implantable) cardiac defibrillator: Secondary | ICD-10-CM

## 2011-10-20 DIAGNOSIS — R05 Cough: Secondary | ICD-10-CM

## 2011-10-20 NOTE — Assessment & Plan Note (Signed)
His ICD is monitored carefully through her followup program.

## 2011-10-20 NOTE — Assessment & Plan Note (Signed)
Patient's coronary status is stable. He does not need any further workup.

## 2011-10-20 NOTE — Assessment & Plan Note (Signed)
He's not having any recurrent cough. No further workup.

## 2011-10-20 NOTE — Assessment & Plan Note (Signed)
The patient had some syncope in the past with relative hypotension. This is why he is on low dose of medications. He's had no recurrent symptoms. He looks great.

## 2011-10-20 NOTE — Progress Notes (Signed)
HPI Patient returns today and looks great. He has significant coronary disease and cardiomyopathy. He has an ICD in place. He's been very very stable. He traveled to [place] to see his family last summer and he will return again this summer to see his son. He does not have chest pain. He has no significant shortness of breath. Allergies  Allergen Reactions  . Morphine     "increased white corpuscles"  . Ramipril     REACTION: cough    Current Outpatient Prescriptions  Medication Sig Dispense Refill  . aspirin 325 MG tablet Take 325 mg by mouth daily.        Marland Kitchen atorvastatin (LIPITOR) 40 MG tablet Take 40 mg by mouth daily.        . carvedilol (COREG) 25 MG tablet Take 25 mg by mouth 2 (two) times daily.        . Cholecalciferol 1000 UNITS tablet Take 1,000 Units by mouth daily.        . digoxin (LANOXIN) 0.125 MG tablet Take 125 mcg by mouth daily.        . nitroGLYCERIN (NITROLINGUAL) 0.4 MG/SPRAY spray Place 1 spray under the tongue as needed.        . ramipril (ALTACE) 2.5 MG capsule Take 2.5 mg by mouth daily.          History   Social History  . Marital Status: Widowed    Spouse Name: N/A    Number of Children: N/A  . Years of Education: N/A   Occupational History  . Retired    Social History Main Topics  . Smoking status: Former Games developer  . Smokeless tobacco: Not on file  . Alcohol Use: No  . Drug Use: No  . Sexually Active: Not Currently   Other Topics Concern  . Not on file   Social History Narrative   Widowed - 06/2010    Family History  Problem Relation Age of Onset  . Hypertension Other   . Colon cancer Other     1st degree relative <50  . Hypertension Mother   . Hypertension Father     Past Medical History  Diagnosis Date  . History of colon polyps   . Hyperlipidemia   . Hypertension   . 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 S/P CABG (coronary artery bypass graft) 1992  . ICD (implantable cardiac defibrillator) in place 03/2007    July, 2008  . Chronotropic incompetence     treated w/pacemaker, ICD 7.2008. This was placed after CPX done post 2008 cath. CPX showed only chronotropic incompetence  . Arrhythmia   . CHF (congestive heart failure)   . Syncopal episodes     relative hypotension  . Mitral regurgitation     mild  . Numbness and tingling of left arm and leg     improved after tx w/prednisone  . Hx of CABG     1992  . Grief reaction     Patient was a son in weight in 2001    Past Surgical History  Procedure Date  . Coronary artery bypass graft 1992  . Cardiac catheterization   . Cardiac defibrillator placement 2008    ROS  Patient denies fever, chills, headache, sweats, rash, change in vision, change in hearing, chest pain, cough, nausea vomiting,  urinary symptoms. All other systems are reviewed and are negative.  PHYSICAL EXAM He is very stable. There is no jugulovenous distention. Lungs are clear. Respiratory effort is nonlabored. Cardiac exam reveals S1 and S2. There are no clicks or significant murmurs. Abdomen is soft. There is no significant peripheral edema. Filed Vitals:   10/20/11 0956  BP: 138/72  Pulse: 76  Resp: 12  Height: 5\' 7"  (1.702 m)  Weight: 175 lb 12.8 oz (79.742 kg)    ASSESSMENT & PLAN

## 2011-10-20 NOTE — Patient Instructions (Signed)
Your physician wants you to follow-up in:  6 months. You will receive a reminder letter in the mail two months in advance. If you don't receive a letter, please call our office to schedule the follow-up appointment.   

## 2011-10-20 NOTE — Assessment & Plan Note (Signed)
The patient is between stage I and stage II. He is fully active. He is on a beta blocker and an ACE inhibitor. He is not a candidate to push the doses higher. In addition he is not a candidate to add spironolactone for a combination of reasons. He has renal insufficiency. He's had some problems with syncope from hypotension in the past.

## 2011-10-23 ENCOUNTER — Telehealth: Payer: Self-pay | Admitting: *Deleted

## 2011-10-23 ENCOUNTER — Ambulatory Visit (INDEPENDENT_AMBULATORY_CARE_PROVIDER_SITE_OTHER): Payer: Medicare Other | Admitting: *Deleted

## 2011-10-23 ENCOUNTER — Other Ambulatory Visit: Payer: Self-pay | Admitting: *Deleted

## 2011-10-23 DIAGNOSIS — I509 Heart failure, unspecified: Secondary | ICD-10-CM

## 2011-10-23 NOTE — Telephone Encounter (Signed)
Spoke with patient and he was willing to schedule his colonoscopy w/ propofol on 12-02-2011 with Dr. Marina Goodell.  Also scheduled the previsit on 11-24-2011 at 8 AM.  Mailed out to patient the Previsit letter, Health history form-recall project and the map to our office.

## 2011-10-24 ENCOUNTER — Encounter: Payer: Self-pay | Admitting: Internal Medicine

## 2011-10-24 LAB — REMOTE ICD DEVICE
AL AMPLITUDE: 2.4 mv
AL IMPEDENCE ICD: 480 Ohm
BAMS-0003: 70 {beats}/min
HV IMPEDENCE: 69 Ohm
TZAT-0001SLOWVT: 1
TZAT-0004SLOWVT: 8
TZAT-0012FASTVT: 200 ms
TZAT-0012SLOWVT: 200 ms
TZAT-0013FASTVT: 1
TZAT-0018FASTVT: NEGATIVE
TZAT-0020SLOWVT: 1 ms
TZON-0003FASTVT: 300 ms
TZON-0005FASTVT: 6
TZST-0001FASTVT: 5
TZST-0001SLOWVT: 3
TZST-0001SLOWVT: 4
TZST-0003FASTVT: 36 J
TZST-0003FASTVT: 36 J
TZST-0003SLOWVT: 25 J

## 2011-10-31 NOTE — Progress Notes (Signed)
ICD remote 

## 2011-11-04 ENCOUNTER — Encounter: Payer: Self-pay | Admitting: *Deleted

## 2011-11-24 ENCOUNTER — Encounter: Payer: Self-pay | Admitting: Internal Medicine

## 2011-11-24 ENCOUNTER — Ambulatory Visit (AMBULATORY_SURGERY_CENTER): Payer: Medicare Other | Admitting: *Deleted

## 2011-11-24 VITALS — Ht 67.0 in | Wt 170.0 lb

## 2011-11-24 DIAGNOSIS — Z1211 Encounter for screening for malignant neoplasm of colon: Secondary | ICD-10-CM

## 2011-11-24 MED ORDER — PEG-KCL-NACL-NASULF-NA ASC-C 100 G PO SOLR: ORAL | Status: DC

## 2011-12-01 ENCOUNTER — Ambulatory Visit (INDEPENDENT_AMBULATORY_CARE_PROVIDER_SITE_OTHER): Payer: Medicare Other | Admitting: Internal Medicine

## 2011-12-01 ENCOUNTER — Encounter: Payer: Self-pay | Admitting: Internal Medicine

## 2011-12-01 VITALS — BP 140/82 | HR 80 | Temp 97.6°F | Resp 16 | Wt 168.0 lb

## 2011-12-01 DIAGNOSIS — I2510 Atherosclerotic heart disease of native coronary artery without angina pectoris: Secondary | ICD-10-CM

## 2011-12-01 DIAGNOSIS — N183 Chronic kidney disease, stage 3 unspecified: Secondary | ICD-10-CM

## 2011-12-01 DIAGNOSIS — I10 Essential (primary) hypertension: Secondary | ICD-10-CM

## 2011-12-01 DIAGNOSIS — I509 Heart failure, unspecified: Secondary | ICD-10-CM

## 2011-12-01 NOTE — Assessment & Plan Note (Signed)
Continue with current prescription therapy as reflected on the Med list.  

## 2011-12-01 NOTE — Progress Notes (Signed)
Patient ID: [name] [name], male   DOB: 27-Feb-1929, 78 y.o.   MRN: [id number]  Subjective:    Patient ID: [name] [name], male    DOB: [birth year], 78 y.o.   MRN: [id number]  HPI  The patient presents for a follow-up of  chronic hypertension, chronic dyslipidemia, CHF controlled with medicines  Wt Readings from Last 3 Encounters:  12/01/11 168 lb (76.204 kg)  11/24/11 170 lb (77.111 kg)  10/20/11 175 lb 12.8 oz (79.742 kg)   BP Readings from Last 3 Encounters:  12/01/11 140/82  10/20/11 138/72  07/31/11 120/60      Review of Systems  Constitutional: Negative for appetite change, fatigue and unexpected weight change.  HENT: Negative for nosebleeds, congestion, sore throat, sneezing, trouble swallowing and neck pain.   Eyes: Negative for itching and visual disturbance.  Respiratory: Negative for cough.   Cardiovascular: Negative for chest pain, palpitations and leg swelling.  Gastrointestinal: Negative for nausea, diarrhea, blood in stool and abdominal distention.  Genitourinary: Negative for frequency and hematuria.  Musculoskeletal: Negative for back pain, joint swelling and gait problem.  Skin: Negative for rash.  Neurological: Negative for dizziness, tremors, speech difficulty and weakness.  Psychiatric/Behavioral: Negative for sleep disturbance, dysphoric mood and agitation. The patient is not nervous/anxious.        Objective:   Physical Exam  Constitutional: He is oriented to person, place, and time. He appears well-developed.  HENT:  Mouth/Throat: Oropharynx is clear and moist.  Eyes: Conjunctivae are normal. Pupils are equal, round, and reactive to light.  Neck: Normal range of motion. No JVD present. No thyromegaly present.  Cardiovascular: Normal rate, regular rhythm, normal heart sounds and intact distal pulses.  Exam reveals no gallop and no friction rub.   No murmur heard. Pulmonary/Chest: Effort normal and breath sounds normal. No respiratory distress. He has no  wheezes. He has no rales. He exhibits no tenderness.  Abdominal: Soft. Bowel sounds are normal. He exhibits no distension and no mass. There is no tenderness. There is no rebound and no guarding.  Musculoskeletal: Normal range of motion. He exhibits no edema and no tenderness.  Lymphadenopathy:    He has no cervical adenopathy.  Neurological: He is alert and oriented to person, place, and time. He has normal reflexes. No cranial nerve deficit. He exhibits normal muscle tone. Coordination normal.  Skin: Skin is warm and dry. No rash noted.  Psychiatric: He has a normal mood and affect. His behavior is normal. Judgment and thought content normal.     Lab Results  Component Value Date   WBC 6.7 12/27/2010   HGB 14.3 12/27/2010   HCT 41.9 12/27/2010   PLT 216.0 12/27/2010   GLUCOSE 108* 07/21/2011   CHOL 130 04/28/2011   TRIG 302.0* 04/28/2011   HDL 37.00* 04/28/2011   LDLDIRECT 50.1 04/28/2011   LDLCALC 65 09/24/2010   ALT 29 04/28/2011   AST 22 04/28/2011   NA 140 07/21/2011   K 4.2 07/21/2011   CL 103 07/21/2011   CREATININE 1.3 07/21/2011   BUN 17 07/21/2011   CO2 27 07/21/2011   TSH 2.05 12/27/2010   PSA 1.83 09/24/2010   INR 0.9 RATIO 03/24/2007   HGBA1C 6.2 02/13/2010        Assessment & Plan:

## 2011-12-02 ENCOUNTER — Encounter: Payer: Self-pay | Admitting: Internal Medicine

## 2011-12-02 ENCOUNTER — Ambulatory Visit (AMBULATORY_SURGERY_CENTER): Payer: Medicare Other | Admitting: Internal Medicine

## 2011-12-02 VITALS — BP 152/87 | HR 70 | Temp 96.8°F | Resp 17 | Ht 67.0 in | Wt 170.0 lb

## 2011-12-02 DIAGNOSIS — Z86010 Personal history of colonic polyps: Secondary | ICD-10-CM

## 2011-12-02 DIAGNOSIS — Z1211 Encounter for screening for malignant neoplasm of colon: Secondary | ICD-10-CM

## 2011-12-02 DIAGNOSIS — D126 Benign neoplasm of colon, unspecified: Secondary | ICD-10-CM

## 2011-12-02 MED ORDER — SODIUM CHLORIDE 0.9 % IV SOLN: 500.0000 mL | INTRAVENOUS | Status: DC

## 2011-12-02 NOTE — Op Note (Signed)
[facility] [address] [place], [place]  [address]  COLONOSCOPY PROCEDURE REPORT  PATIENT:  [name] [name], [name] [name]  MR#:  [id number] BIRTHDATE:  [birth year], 82 yrs. old  GENDER:  male ENDOSCOPIST:  Wilhemina Bonito. Eda Keys, MD REF. BY:  Surveillance Program Recall, PROCEDURE DATE:  12/02/2011 PROCEDURE:  Colonoscopy with snare polypectomy x 4 ASA CLASS:  Class II INDICATIONS:  history of pre-cancerous (adenomatous) colon polyps, surveillance and high-risk screening ; index 02-2005 w/ small TA MEDICATIONS:   MAC sedation, administered by CRNA, propofol (Diprivan) 150 mg IV  DESCRIPTION OF PROCEDURE:   After the risks benefits and alternatives of the procedure were thoroughly explained, informed consent was obtained.  Digital rectal exam was performed and revealed no abnormalities.   The LB 180AL [id number] endoscope was introduced through the anus and advanced to the cecum, which was identified by both the appendix and ileocecal valve, without limitations.  The quality of the prep was excellent, using MoviPrep.  The instrument was then slowly withdrawn as the colon was fully examined. <<PROCEDUREIMAGES>>  FINDINGS:  Four polyps, < 6mm,  were found in the ascending colon and snared without cautery. Retrieval was successful in 3/4. Moderate diverticulosis was found in the left colon.  Otherwise normal colonoscopy without other polyps, masses, vascular ectasias, or inflammatory changes.   Retroflexed views in the rectum revealed internal hemorrhoids.    The time to cecum = 2:05 minutes. The scope was then withdrawn in 13  minutes from the cecum and the procedure completed.  COMPLICATIONS:  None  ENDOSCOPIC IMPRESSION: 1) Four small polyps in the ascending colon - removed 2) Moderate diverticulosis in the left colon 3) Otherwise normal colonoscopy  RECOMMENDATIONS: 1) Return to the care of your primary provider. GI follow up as needed  ______________________________ Wilhemina Bonito. Eda Keys, MD  CC:  Linda Hedges. Plotnikov, MD;  The Patient  n. eSIGNED:   Wilhemina Bonito. Eda Keys at 12/02/2011 08:39 AM  Lambert Mody, [id number]

## 2011-12-02 NOTE — Progress Notes (Signed)
Patient did not experience any of the following events: a burn prior to discharge; a fall within the facility; wrong site/side/patient/procedure/implant event; or a hospital transfer or hospital admission upon discharge from the facility. (G8907) Patient did not have preoperative order for IV antibiotic SSI prophylaxis. (G8918)  

## 2011-12-02 NOTE — Patient Instructions (Signed)
YOU HAD AN ENDOSCOPIC PROCEDURE TODAY AT THE [facility]: Refer to the procedure report that was given to you for any specific questions about what was found during the examination.  If the procedure report does not answer your questions, please call your gastroenterologist to clarify.  If you requested that your care partner not be given the details of your procedure findings, then the procedure report has been included in a sealed envelope for you to review at your convenience later.  YOU SHOULD EXPECT: Some feelings of bloating in the abdomen. Passage of more gas than usual.  Walking can help get rid of the air that was put into your GI tract during the procedure and reduce the bloating. If you had a lower endoscopy (such as a colonoscopy or flexible sigmoidoscopy) you may notice spotting of blood in your stool or on the toilet paper. If you underwent a bowel prep for your procedure, then you may not have a normal bowel movement for a few days.  DIET: Your first meal following the procedure should be a light meal and then it is ok to progress to your normal diet.  A half-sandwich or bowl of soup is an example of a good first meal.  Heavy or fried foods are harder to digest and may make you feel nauseous or bloated.  Likewise meals heavy in dairy and vegetables can cause extra gas to form and this can also increase the bloating.  Drink plenty of fluids but you should avoid alcoholic beverages for 24 hours.  ACTIVITY: Your care partner should take you home directly after the procedure.  You should plan to take it easy, moving slowly for the rest of the day.  You can resume normal activity the day after the procedure however you should NOT DRIVE or use heavy machinery for 24 hours (because of the sedation medicines used during the test).    SYMPTOMS TO REPORT IMMEDIATELY: A gastroenterologist can be reached at any hour.  During normal business hours, 8:30 AM to 5:00 PM Monday through Friday,  call [phone].  After hours and on weekends, please call the GI answering service at [phone] who will take a message and have the physician on call contact you.   Following lower endoscopy (colonoscopy or flexible sigmoidoscopy):  Excessive amounts of blood in the stool  Significant tenderness or worsening of abdominal pains  Swelling of the abdomen that is new, acute  Fever of 100F or higher    FOLLOW UP: If any biopsies were taken you will be contacted by phone or by letter within the next 1-3 weeks.  Call your gastroenterologist if you have not heard about the biopsies in 3 weeks.  Our staff will call the home number listed on your records the next business day following your procedure to check on you and address any questions or concerns that you may have at that time regarding the information given to you following your procedure. This is a courtesy call and so if there is no answer at the home number and we have not heard from you through the emergency physician on call, we will assume that you have returned to your regular daily activities without incident.  SIGNATURES/CONFIDENTIALITY: You and/or your care partner have signed paperwork which will be entered into your electronic medical record.  These signatures attest to the fact that that the information above on your After Visit Summary has been reviewed and is understood.  Full responsibility of the confidentiality   of this discharge information lies with you and/or your care-partner.     

## 2011-12-03 ENCOUNTER — Telehealth: Payer: Self-pay | Admitting: *Deleted

## 2011-12-03 NOTE — Telephone Encounter (Signed)
  Follow up Call-  Call back number 12/02/2011  Post procedure Call Back phone  # [phone]  Permission to leave phone message Yes     Patient questions:  Do you have a fever, pain , or abdominal swelling? no Pain Score  0 *  Have you tolerated food without any problems? yes  Have you been able to return to your normal activities? yes  Do you have any questions about your discharge instructions: Diet   no Medications  no Follow up visit  no  Do you have questions or concerns about your Care? no  Actions: * If pain score is 4 or above: No action needed, pain <4.

## 2011-12-08 ENCOUNTER — Encounter: Payer: Self-pay | Admitting: Internal Medicine

## 2012-01-22 ENCOUNTER — Ambulatory Visit (INDEPENDENT_AMBULATORY_CARE_PROVIDER_SITE_OTHER): Payer: Medicare Other | Admitting: *Deleted

## 2012-01-22 ENCOUNTER — Encounter: Payer: Self-pay | Admitting: Internal Medicine

## 2012-01-22 DIAGNOSIS — I428 Other cardiomyopathies: Secondary | ICD-10-CM

## 2012-01-22 LAB — REMOTE ICD DEVICE
AL AMPLITUDE: 2.2 mv
BAMS-0001: 180 {beats}/min
HV IMPEDENCE: 63 Ohm
TZAT-0001FASTVT: 1
TZAT-0013SLOWVT: 3
TZAT-0018FASTVT: NEGATIVE
TZAT-0018SLOWVT: NEGATIVE
TZAT-0019FASTVT: 7.5 V
TZAT-0019SLOWVT: 7.5 V
TZAT-0020FASTVT: 1 ms
TZON-0003FASTVT: 300 ms
TZON-0003SLOWVT: 340 ms
TZON-0004FASTVT: 12
TZON-0004SLOWVT: 12
TZON-0005FASTVT: 6
TZON-0010FASTVT: 80 ms
TZST-0001FASTVT: 2
TZST-0001FASTVT: 3
TZST-0001FASTVT: 4
TZST-0001SLOWVT: 2
TZST-0001SLOWVT: 3
TZST-0001SLOWVT: 5
TZST-0003FASTVT: 36 J
TZST-0003FASTVT: 36 J
TZST-0003FASTVT: 36 J
TZST-0003SLOWVT: 36 J

## 2012-01-30 ENCOUNTER — Encounter: Payer: Self-pay | Admitting: *Deleted

## 2012-02-06 NOTE — Progress Notes (Signed)
ICD remote 

## 2012-04-05 ENCOUNTER — Encounter: Payer: Self-pay | Admitting: Internal Medicine

## 2012-04-05 ENCOUNTER — Ambulatory Visit (INDEPENDENT_AMBULATORY_CARE_PROVIDER_SITE_OTHER): Payer: Medicare Other | Admitting: Internal Medicine

## 2012-04-05 VITALS — BP 120/60 | HR 76 | Temp 98.1°F | Resp 16 | Ht 67.0 in | Wt 161.0 lb

## 2012-04-05 DIAGNOSIS — E785 Hyperlipidemia, unspecified: Secondary | ICD-10-CM

## 2012-04-05 DIAGNOSIS — I509 Heart failure, unspecified: Secondary | ICD-10-CM

## 2012-04-05 DIAGNOSIS — I10 Essential (primary) hypertension: Secondary | ICD-10-CM

## 2012-04-05 DIAGNOSIS — Z0000 Encounter for general adult medical examination without abnormal findings: Secondary | ICD-10-CM

## 2012-04-05 DIAGNOSIS — D638 Anemia in other chronic diseases classified elsewhere: Secondary | ICD-10-CM

## 2012-04-05 DIAGNOSIS — I4589 Other specified conduction disorders: Secondary | ICD-10-CM

## 2012-04-05 DIAGNOSIS — I2510 Atherosclerotic heart disease of native coronary artery without angina pectoris: Secondary | ICD-10-CM

## 2012-04-05 NOTE — Progress Notes (Signed)
   Subjective:    Patient ID: [name] Cleverly., male    DOB: November 05, 1928, 78 y.o.   MRN: [id number]  HPI  The patient presents for a follow-up of  chronic hypertension, chronic dyslipidemia, CHF controlled with medicines. He is getting married  Wt Readings from Last 3 Encounters:  04/05/12 161 lb (73.029 kg)  12/02/11 170 lb (77.111 kg)  12/01/11 168 lb (76.204 kg)   BP Readings from Last 3 Encounters:  04/05/12 120/60  12/02/11 152/87  12/01/11 140/82      Review of Systems  Constitutional: Negative for appetite change, fatigue and unexpected weight change.  HENT: Negative for nosebleeds, congestion, sore throat, sneezing, trouble swallowing and neck pain.   Eyes: Negative for itching and visual disturbance.  Respiratory: Negative for cough.   Cardiovascular: Negative for chest pain, palpitations and leg swelling.  Gastrointestinal: Negative for nausea, diarrhea, blood in stool and abdominal distention.  Genitourinary: Negative for frequency and hematuria.  Musculoskeletal: Negative for back pain, joint swelling and gait problem.  Skin: Negative for rash.  Neurological: Negative for dizziness, tremors, speech difficulty and weakness.  Psychiatric/Behavioral: Negative for disturbed wake/sleep cycle, dysphoric mood and agitation. The patient is not nervous/anxious.        Objective:   Physical Exam  Constitutional: He is oriented to person, place, and time. He appears well-developed.  HENT:  Mouth/Throat: Oropharynx is clear and moist.  Eyes: Conjunctivae are normal. Pupils are equal, round, and reactive to light.  Neck: Normal range of motion. No JVD present. No thyromegaly present.  Cardiovascular: Normal rate, regular rhythm, normal heart sounds and intact distal pulses.  Exam reveals no gallop and no friction rub.   No murmur heard. Pulmonary/Chest: Effort normal and breath sounds normal. No respiratory distress. He has no wheezes. He has no rales. He exhibits no  tenderness.  Abdominal: Soft. Bowel sounds are normal. He exhibits no distension and no mass. There is no tenderness. There is no rebound and no guarding.  Musculoskeletal: Normal range of motion. He exhibits no edema and no tenderness.  Lymphadenopathy:    He has no cervical adenopathy.  Neurological: He is alert and oriented to person, place, and time. He has normal reflexes. No cranial nerve deficit. He exhibits normal muscle tone. Coordination normal.  Skin: Skin is warm and dry. No rash noted.  Psychiatric: He has a normal mood and affect. His behavior is normal. Judgment and thought content normal.     Lab Results  Component Value Date   WBC 6.7 12/27/2010   HGB 14.3 12/27/2010   HCT 41.9 12/27/2010   PLT 216.0 12/27/2010   GLUCOSE 108* 07/21/2011   CHOL 130 04/28/2011   TRIG 302.0* 04/28/2011   HDL 37.00* 04/28/2011   LDLDIRECT 50.1 04/28/2011   LDLCALC 65 09/24/2010   ALT 29 04/28/2011   AST 22 04/28/2011   NA 140 07/21/2011   K 4.2 07/21/2011   CL 103 07/21/2011   CREATININE 1.3 07/21/2011   BUN 17 07/21/2011   CO2 27 07/21/2011   TSH 2.05 12/27/2010   PSA 1.83 09/24/2010   INR 0.9 RATIO 03/24/2007   HGBA1C 6.2 02/13/2010        Assessment & Plan:

## 2012-04-05 NOTE — Assessment & Plan Note (Signed)
Continue with current prescription therapy as reflected on the Med list.  

## 2012-04-05 NOTE — Assessment & Plan Note (Signed)
No complaints  Continue with current prescription therapy as reflected on the Med list.

## 2012-04-29 ENCOUNTER — Ambulatory Visit (INDEPENDENT_AMBULATORY_CARE_PROVIDER_SITE_OTHER): Payer: Medicare Other | Admitting: *Deleted

## 2012-04-29 DIAGNOSIS — I509 Heart failure, unspecified: Secondary | ICD-10-CM

## 2012-05-03 ENCOUNTER — Encounter: Payer: Self-pay | Admitting: Internal Medicine

## 2012-05-03 ENCOUNTER — Encounter: Payer: Self-pay | Admitting: *Deleted

## 2012-05-03 LAB — REMOTE ICD DEVICE
AL IMPEDENCE ICD: 440 Ohm
BATTERY VOLTAGE: 2.6 V
MODE SWITCH EPISODES: 1
RV LEAD IMPEDENCE ICD: 430 Ohm
TZAT-0012SLOWVT: 200 ms
TZAT-0013FASTVT: 1
TZAT-0013SLOWVT: 3
TZAT-0018FASTVT: NEGATIVE
TZAT-0020FASTVT: 1 ms
TZAT-0020SLOWVT: 1 ms
TZON-0003FASTVT: 300 ms
TZON-0004FASTVT: 12
TZON-0005FASTVT: 6
TZON-0005SLOWVT: 6
TZST-0001FASTVT: 2
TZST-0001SLOWVT: 3
TZST-0001SLOWVT: 5
TZST-0003FASTVT: 36 J
TZST-0003FASTVT: 36 J
TZST-0003SLOWVT: 36 J

## 2012-05-07 ENCOUNTER — Encounter: Payer: Self-pay | Admitting: Cardiology

## 2012-05-07 ENCOUNTER — Ambulatory Visit (INDEPENDENT_AMBULATORY_CARE_PROVIDER_SITE_OTHER): Payer: Medicare Other | Admitting: Cardiology

## 2012-05-07 VITALS — BP 140/62 | HR 72 | Resp 20 | Ht 67.0 in | Wt 163.8 lb

## 2012-05-07 DIAGNOSIS — I2510 Atherosclerotic heart disease of native coronary artery without angina pectoris: Secondary | ICD-10-CM

## 2012-05-07 DIAGNOSIS — R55 Syncope and collapse: Secondary | ICD-10-CM

## 2012-05-07 DIAGNOSIS — I509 Heart failure, unspecified: Secondary | ICD-10-CM

## 2012-05-07 NOTE — Assessment & Plan Note (Signed)
The patient had syncope in the past from relative hypotension. This is why he remains on low-dose medications. He looks great.

## 2012-05-07 NOTE — Assessment & Plan Note (Signed)
Coronary disease is stable. He needs no further workup. 

## 2012-05-07 NOTE — Assessment & Plan Note (Signed)
The patient CHF is completely compensated. He is on a beta blocker and ACE inhibitor. I have repeatedly considered whether I should put his medicine doses up. He has had syncope in the past with relative hypotension. He has renal insufficiency. He is not a good candidate for spironolactone. No change in his medicines. When I see him back in 6 months I will consider a followup echo. The last study was done 2009

## 2012-05-07 NOTE — Progress Notes (Signed)
HPI  Patient is seen to followup coronary disease and cardiomyopathy. He is doing remarkably well. He has no chest pain and no shortness of breath. He traveled to [place] With his fiancee.They went to see his son. He is getting married in the next few months. He has not had any palpitations. There's been no syncope or presyncope.  Allergies  Allergen Reactions  . Morphine     "increased white corpuscles"  . Ramipril Other (See Comments)    REACTION: cough if dose is over 2.5mg     Current Outpatient Prescriptions  Medication Sig Dispense Refill  . aspirin 325 MG tablet Take 325 mg by mouth daily.        Marland Kitchen atorvastatin (LIPITOR) 40 MG tablet Take 40 mg by mouth daily.        . carvedilol (COREG) 25 MG tablet Take 25 mg by mouth 2 (two) times daily.        . Cholecalciferol 1000 UNITS tablet Take 1,000 Units by mouth daily.        . digoxin (LANOXIN) 0.125 MG tablet Take 125 mcg by mouth daily.        . nitroGLYCERIN (NITROLINGUAL) 0.4 MG/SPRAY spray Place 1 spray under the tongue as needed.        . ramipril (ALTACE) 2.5 MG capsule Take 2.5 mg by mouth daily.          History   Social History  . Marital Status: Widowed    Spouse Name: N/A    Number of Children: N/A  . Years of Education: N/A   Occupational History  . Retired    Social History Main Topics  . Smoking status: Former Smoker    Types: Cigars  . Smokeless tobacco: Never Used  . Alcohol Use: No  . Drug Use: No  . Sexually Active: Not Currently   Other Topics Concern  . Not on file   Social History Narrative   Widowed - 06/2010    Family History  Problem Relation Age of Onset  . Hypertension Other   . Colon cancer Other     1st degree relative <50  . Hypertension Mother   . Hypertension Father     Past Medical History  Diagnosis Date  . History of colon polyps   . Hyperlipidemia   . Hypertension   . 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 S/P CABG (coronary artery bypass graft) 1992  . ICD (implantable cardiac defibrillator) in place 03/2007    July, 2008  . Chronotropic incompetence     treated w/pacemaker, ICD 7.2008. This was placed after CPX done post 2008 cath. CPX showed only chronotropic incompetence  . Arrhythmia   . CHF (congestive heart failure)   . Syncopal episodes     relative hypotension  . Mitral regurgitation     mild  . Numbness and tingling of left arm and leg     improved after tx w/prednisone  . Hx of CABG     1992  . Grief reaction     Patient was a son in weight in 2001  . Arthritis   . Blood transfusion 1969  . Myocardial infarction     Past Surgical History  Procedure Date  . Coronary artery bypass graft 1992  . Cardiac catheterization   .  Cardiac defibrillator placement 2008  . Leg surgery 1942    Fx leg repair with artificial bone    ROS  Patient denies fever, chills, headache, sweats, rash, change in vision, change in hearing, chest pain, cough, nausea vomiting, urinary symptoms. All other systems are reviewed and are negative.  PHYSICAL EXAM  He looks fantastic. He is oriented to person time and place. Affect is normal. There is no jugulovenous distention. Lungs are clear. Respiratory effort is nonlabored. Cardiac exam reveals S1 and S2. There no clicks or significant murmurs. The abdomen is soft. There is no peripheral edema.  Filed Vitals:   05/07/12 1408  BP: 140/62  Pulse: 72  Resp: 20  Height: 5\' 7"  (1.702 m)  Weight: 163 lb 12.8 oz (74.299 kg)   EKG reveals atrial pacing. There is no change in the QRS.  ASSESSMENT & PLAN

## 2012-05-07 NOTE — Patient Instructions (Addendum)
Your physician recommends that you schedule a follow-up appointment in: November with Dr Graciela Husbands  Your physician wants you to follow-up in: 6 months.   You will receive a reminder letter in the mail two months in advance. If you don't receive a letter, please call our office to schedule the follow-up appointment.

## 2012-07-07 ENCOUNTER — Other Ambulatory Visit (INDEPENDENT_AMBULATORY_CARE_PROVIDER_SITE_OTHER): Payer: Medicare Other

## 2012-07-07 DIAGNOSIS — I4589 Other specified conduction disorders: Secondary | ICD-10-CM

## 2012-07-07 DIAGNOSIS — I10 Essential (primary) hypertension: Secondary | ICD-10-CM

## 2012-07-07 DIAGNOSIS — Z125 Encounter for screening for malignant neoplasm of prostate: Secondary | ICD-10-CM

## 2012-07-07 DIAGNOSIS — I509 Heart failure, unspecified: Secondary | ICD-10-CM

## 2012-07-07 DIAGNOSIS — Z0000 Encounter for general adult medical examination without abnormal findings: Secondary | ICD-10-CM

## 2012-07-07 DIAGNOSIS — E785 Hyperlipidemia, unspecified: Secondary | ICD-10-CM

## 2012-07-07 DIAGNOSIS — I2510 Atherosclerotic heart disease of native coronary artery without angina pectoris: Secondary | ICD-10-CM

## 2012-07-07 LAB — LIPID PANEL
Cholesterol: 112 mg/dL (ref 0–200)
HDL: 32.8 mg/dL — ABNORMAL LOW (ref >39.00)
Total CHOL/HDL Ratio: 3
Triglycerides: 99 mg/dL (ref 0.0–149.0)

## 2012-07-07 LAB — URINALYSIS
Hgb urine dipstick: NEGATIVE
Nitrite: NEGATIVE
Urobilinogen, UA: 0.2 (ref 0.0–1.0)

## 2012-07-07 LAB — HEPATIC FUNCTION PANEL
AST: 12 U/L (ref 0–37)
Albumin: 3.3 g/dL — ABNORMAL LOW (ref 3.5–5.2)
Total Protein: 6.6 g/dL (ref 6.0–8.3)

## 2012-07-07 LAB — CBC WITH DIFFERENTIAL/PLATELET
Basophils Relative: 0.6 % (ref 0.0–3.0)
Eosinophils Absolute: 0.1 10*3/uL (ref 0.0–0.7)
Eosinophils Relative: 1.4 % (ref 0.0–5.0)
Lymphocytes Relative: 10.3 % — ABNORMAL LOW (ref 12.0–46.0)
Neutrophils Relative %: 80.7 % — ABNORMAL HIGH (ref 43.0–77.0)
Platelets: 193 10*3/uL (ref 150.0–400.0)
RBC: 4.32 Mil/uL (ref 4.22–5.81)
WBC: 9.7 10*3/uL (ref 4.5–10.5)

## 2012-07-07 LAB — BASIC METABOLIC PANEL
Calcium: 8.9 mg/dL (ref 8.4–10.5)
Creatinine, Ser: 1.1 mg/dL (ref 0.4–1.5)

## 2012-07-09 LAB — PSA: PSA: 1.96 ng/mL (ref 0.10–4.00)

## 2012-07-13 ENCOUNTER — Encounter: Payer: Self-pay | Admitting: Internal Medicine

## 2012-07-13 ENCOUNTER — Ambulatory Visit (INDEPENDENT_AMBULATORY_CARE_PROVIDER_SITE_OTHER): Payer: Medicare Other | Admitting: Internal Medicine

## 2012-07-13 VITALS — BP 128/68 | HR 68 | Temp 98.1°F | Resp 16 | Ht 66.5 in | Wt 158.0 lb

## 2012-07-13 DIAGNOSIS — Z23 Encounter for immunization: Secondary | ICD-10-CM

## 2012-07-13 DIAGNOSIS — Z0000 Encounter for general adult medical examination without abnormal findings: Secondary | ICD-10-CM

## 2012-07-13 NOTE — Progress Notes (Signed)
   Subjective:    Patient ID: [name] [name]., male    DOB: 1929/03/08, 78 y.o.   MRN: [id number]  HPI The patient is here for a wellness exam. The patient has been doing well overall without major physical or psychological issues going on lately.  The patient presents for a follow-up of  chronic hypertension, chronic dyslipidemia, CHF controlled with medicines. He is getting married  Wt Readings from Last 3 Encounters:  07/13/12 158 lb (71.668 kg)  05/07/12 163 lb 12.8 oz (74.299 kg)  04/05/12 161 lb (73.029 kg)   BP Readings from Last 3 Encounters:  07/13/12 128/68  05/07/12 140/62  04/05/12 120/60      Review of Systems  Constitutional: Negative for appetite change, fatigue and unexpected weight change.  HENT: Negative for nosebleeds, congestion, sore throat, sneezing, trouble swallowing and neck pain.   Eyes: Negative for itching and visual disturbance.  Respiratory: Negative for cough.   Cardiovascular: Negative for chest pain, palpitations and leg swelling.  Gastrointestinal: Negative for nausea, diarrhea, blood in stool and abdominal distention.  Genitourinary: Negative for frequency and hematuria.  Musculoskeletal: Negative for back pain, joint swelling and gait problem.  Skin: Negative for rash.  Neurological: Negative for dizziness, tremors, speech difficulty and weakness.  Psychiatric/Behavioral: Negative for disturbed wake/sleep cycle, dysphoric mood and agitation. The patient is not nervous/anxious.        Objective:   Physical Exam  Constitutional: He is oriented to person, place, and time. He appears well-developed.  HENT:  Mouth/Throat: Oropharynx is clear and moist.  Eyes: Conjunctivae normal are normal. Pupils are equal, round, and reactive to light.  Neck: Normal range of motion. No JVD present. No thyromegaly present.  Cardiovascular: Normal rate, regular rhythm, normal heart sounds and intact distal pulses.  Exam reveals no gallop and no friction rub.    No murmur heard. Pulmonary/Chest: Effort normal and breath sounds normal. No respiratory distress. He has no wheezes. He has no rales. He exhibits no tenderness.  Abdominal: Soft. Bowel sounds are normal. He exhibits no distension and no mass. There is no tenderness. There is no rebound and no guarding.  Musculoskeletal: Normal range of motion. He exhibits no edema and no tenderness.  Lymphadenopathy:    He has no cervical adenopathy.  Neurological: He is alert and oriented to person, place, and time. He has normal reflexes. No cranial nerve deficit. He exhibits normal muscle tone. Coordination normal.  Skin: Skin is warm and dry. No rash noted.  Psychiatric: He has a normal mood and affect. His behavior is normal. Judgment and thought content normal.     Lab Results  Component Value Date   WBC 9.7 07/07/2012   HGB 12.4* 07/07/2012   HCT 37.4* 07/07/2012   PLT 193.0 07/07/2012   GLUCOSE 93 07/07/2012   CHOL 112 07/07/2012   TRIG 99.0 07/07/2012   HDL 32.80* 07/07/2012   LDLDIRECT 50.1 04/28/2011   LDLCALC 59 07/07/2012   ALT 17 07/07/2012   AST 12 07/07/2012   NA 137 07/07/2012   K 4.9 07/07/2012   CL 102 07/07/2012   CREATININE 1.1 07/07/2012   BUN 18 07/07/2012   CO2 30 07/07/2012   TSH 2.22 07/07/2012   PSA 1.96 07/07/2012   INR 0.9 RATIO 03/24/2007   HGBA1C 6.2 02/13/2010        Assessment & Plan:

## 2012-07-13 NOTE — Assessment & Plan Note (Signed)

## 2012-07-13 NOTE — Patient Instructions (Signed)
Zostavax - a vaccine for shingles

## 2012-07-14 ENCOUNTER — Other Ambulatory Visit: Payer: Self-pay | Admitting: *Deleted

## 2012-07-14 MED ORDER — CARVEDILOL 25 MG PO TABS: 25.0000 mg | ORAL_TABLET | Freq: Two times a day (BID) | ORAL | Status: DC

## 2012-07-14 MED ORDER — ATORVASTATIN CALCIUM 40 MG PO TABS: 40.0000 mg | ORAL_TABLET | Freq: Every day | ORAL | Status: DC

## 2012-07-14 MED ORDER — DIGOXIN 125 MCG PO TABS: 125.0000 ug | ORAL_TABLET | Freq: Every day | ORAL | Status: DC

## 2012-07-14 MED ORDER — RAMIPRIL 2.5 MG PO CAPS: 2.5000 mg | ORAL_CAPSULE | Freq: Every day | ORAL | Status: DC

## 2012-07-14 NOTE — Telephone Encounter (Signed)
Fax Received. Refill Completed. [name] [name] (R.M.A)   

## 2012-07-19 ENCOUNTER — Other Ambulatory Visit: Payer: Self-pay

## 2012-07-19 MED ORDER — CARVEDILOL 25 MG PO TABS: 25.0000 mg | ORAL_TABLET | Freq: Two times a day (BID) | ORAL | Status: DC

## 2012-07-19 MED ORDER — DIGOXIN 125 MCG PO TABS: 125.0000 ug | ORAL_TABLET | Freq: Every day | ORAL | Status: DC

## 2012-07-19 MED ORDER — RAMIPRIL 2.5 MG PO CAPS: 2.5000 mg | ORAL_CAPSULE | Freq: Every day | ORAL | Status: DC

## 2012-07-19 MED ORDER — ATORVASTATIN CALCIUM 40 MG PO TABS: 40.0000 mg | ORAL_TABLET | Freq: Every day | ORAL | Status: DC

## 2012-07-20 ENCOUNTER — Encounter: Payer: Medicare Other | Admitting: Internal Medicine

## 2012-07-22 ENCOUNTER — Ambulatory Visit (INDEPENDENT_AMBULATORY_CARE_PROVIDER_SITE_OTHER): Payer: Medicare Other | Admitting: Internal Medicine

## 2012-07-22 ENCOUNTER — Encounter: Payer: Self-pay | Admitting: Internal Medicine

## 2012-07-22 VITALS — BP 141/76 | HR 71 | Ht 66.5 in | Wt 154.1 lb

## 2012-07-22 DIAGNOSIS — Z95810 Presence of automatic (implantable) cardiac defibrillator: Secondary | ICD-10-CM

## 2012-07-22 DIAGNOSIS — I509 Heart failure, unspecified: Secondary | ICD-10-CM

## 2012-07-22 DIAGNOSIS — I255 Ischemic cardiomyopathy: Secondary | ICD-10-CM

## 2012-07-22 DIAGNOSIS — I2589 Other forms of chronic ischemic heart disease: Secondary | ICD-10-CM

## 2012-07-22 LAB — ICD DEVICE OBSERVATION
AL IMPEDENCE ICD: 387.5 Ohm
AL THRESHOLD: 0.75 V
ATRIAL PACING ICD: 93 pct
BAMS-0003: 70 {beats}/min
HV IMPEDENCE: 59 Ohm
PACEART VT: 0
RV LEAD IMPEDENCE ICD: 425 Ohm
TOT-0006: 20080711000000
TOT-0007: 4
TOT-0008: 0
TOT-0009: 1
TZAT-0001FASTVT: 1
TZAT-0004FASTVT: 8
TZAT-0013SLOWVT: 3
TZAT-0018FASTVT: NEGATIVE
TZAT-0018SLOWVT: NEGATIVE
TZAT-0019FASTVT: 7.5 V
TZAT-0019SLOWVT: 7.5 V
TZAT-0020SLOWVT: 1 ms
TZON-0003SLOWVT: 340 ms
TZON-0004SLOWVT: 12
TZON-0005FASTVT: 6
TZON-0005SLOWVT: 6
TZON-0010FASTVT: 80 ms
TZON-0010SLOWVT: 80 ms
TZST-0001FASTVT: 2
TZST-0001FASTVT: 3
TZST-0001FASTVT: 4
TZST-0001SLOWVT: 2
TZST-0001SLOWVT: 5
TZST-0003FASTVT: 36 J
TZST-0003FASTVT: 36 J
TZST-0003SLOWVT: 36 J
TZST-0003SLOWVT: 36 J

## 2012-07-22 MED ORDER — ASPIRIN 81 MG PO TABS: 81.0000 mg | ORAL_TABLET | Freq: Every day | ORAL | Status: DC

## 2012-07-22 NOTE — Assessment & Plan Note (Signed)
The patient's device was interrogated.  The information was reviewed. No changes were made in the programming.    

## 2012-07-22 NOTE — Assessment & Plan Note (Signed)
Will decrese asa >>81 mg daily as chronic dose

## 2012-07-22 NOTE — Patient Instructions (Signed)
Your physician wants you to follow-up in: 1 year in [place] with Dr. Johney Frame for a PHYS DEFIB CHECK. You will receive a reminder letter in the mail two months in advance. If you don't receive a letter, please call our office to schedule the  follow-up appointment.  Stop Aspirin 325mg .  Start Aspirin 81mg  daily.

## 2012-07-22 NOTE — Assessment & Plan Note (Signed)
No syncope.   

## 2012-07-22 NOTE — Progress Notes (Signed)
Patient Care Team: Tresa Garter, MD as PCP - General [name], MD (Cardiology) Duke Salvia, MD (Cardiology)   HPI  [name] [name]. is a 78 y.o. male   seen in followup for an ICD implanted for primary prevention in the setting of ischemic heart disease.    patient denies chest pain, shortness of breath, nocturnal dyspnea, orthopnea or peripheral edema. There have been no palpitations, lightheadedness or syncope.  He recently went to see his son who lives in [place]. He has recently got married and now lives on her [place] farm in [place]  Past Medical History  Diagnosis Date  . History of colon polyps   . Hyperlipidemia   . Hypertension   . 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 S/P CABG (coronary artery bypass graft) 1992  . ICD (implantable cardiac defibrillator) in place 03/2007    July, 2008  . Chronotropic incompetence     treated w/pacemaker, ICD 7.2008. This was placed after CPX done post 2008 cath. CPX showed only chronotropic incompetence  . Arrhythmia   . CHF (congestive heart failure)   . Syncopal episodes     relative hypotension  . Mitral regurgitation     mild  . Numbness and tingling of left arm and leg     improved after tx w/prednisone  . Hx of CABG     1992  . Grief reaction     Patient was a son in weight in 2001  . Arthritis   . Blood transfusion 1969  . Myocardial infarction     Past Surgical History  Procedure Date  . Coronary artery bypass graft 1992  . Cardiac catheterization   . Cardiac defibrillator placement 2008  . Leg surgery 1942    Fx leg repair with artificial bone    Current Outpatient Prescriptions  Medication Sig Dispense Refill  . aspirin 325 MG tablet Take 325 mg by mouth daily.        Marland Kitchen atorvastatin (LIPITOR) 40 MG  tablet Take 1 tablet (40 mg total) by mouth daily.  90 tablet  2  . carvedilol (COREG) 25 MG tablet Take 1 tablet (25 mg total) by mouth 2 (two) times daily.  180 tablet  2  . Cholecalciferol 1000 UNITS tablet Take 1,000 Units by mouth daily.        . digoxin (LANOXIN) 0.125 MG tablet Take 1 tablet (125 mcg total) by mouth daily.  90 tablet  2  . nitroGLYCERIN (NITROLINGUAL) 0.4 MG/SPRAY spray Place 1 spray under the tongue as needed.        . ramipril (ALTACE) 2.5 MG capsule Take 1 capsule (2.5 mg total) by mouth daily.  90 capsule  2    Allergies  Allergen Reactions  . Morphine     "increased white corpuscles"  . Ramipril Other (See Comments)    REACTION: cough if dose is over 2.5mg     Review of Systems negative except from HPI and PMH  Physical Exam BP 141/76  Pulse 71  Ht 5' 6.5" (1.689 m)  Wt 154 lb 1.9 oz (69.908 kg)  BMI 24.50 kg/m2  SpO2 97% Well developed and nourished in no acute distress HENT normal Neck supple with JVP-flat Carotids brisk and full without bruits Clear Regular rate and rhythm, no murmurs  or gallops Abd-soft with active BS without hepatomegaly No Clubbing cyanosis edema Skin-warm and dry A & Oriented  Grossly normal sensory and motor function     Assessment and  Plan

## 2012-09-28 ENCOUNTER — Other Ambulatory Visit (INDEPENDENT_AMBULATORY_CARE_PROVIDER_SITE_OTHER): Payer: Medicare Other

## 2012-09-28 ENCOUNTER — Ambulatory Visit (INDEPENDENT_AMBULATORY_CARE_PROVIDER_SITE_OTHER): Payer: Medicare Other | Admitting: Internal Medicine

## 2012-09-28 ENCOUNTER — Encounter: Payer: Self-pay | Admitting: Internal Medicine

## 2012-09-28 VITALS — BP 162/80 | HR 80 | Temp 97.7°F | Resp 16 | Wt 159.0 lb

## 2012-09-28 DIAGNOSIS — R197 Diarrhea, unspecified: Secondary | ICD-10-CM

## 2012-09-28 LAB — CBC WITH DIFFERENTIAL/PLATELET
Basophils Relative: 0.8 % (ref 0.0–3.0)
Eosinophils Relative: 3.3 % (ref 0.0–5.0)
HCT: 40.6 % (ref 39.0–52.0)
Lymphs Abs: 1.2 10*3/uL (ref 0.7–4.0)
Monocytes Relative: 7.6 % (ref 3.0–12.0)
Neutrophils Relative %: 75.5 % (ref 43.0–77.0)
Platelets: 228 10*3/uL (ref 150.0–400.0)
RBC: 4.83 Mil/uL (ref 4.22–5.81)
WBC: 9.1 10*3/uL (ref 4.5–10.5)

## 2012-09-28 LAB — BASIC METABOLIC PANEL
BUN: 22 mg/dL (ref 6–23)
CO2: 26 mEq/L (ref 19–32)
Glucose, Bld: 90 mg/dL (ref 70–99)
Potassium: 4.8 mEq/L (ref 3.5–5.1)
Sodium: 137 mEq/L (ref 135–145)

## 2012-09-28 MED ORDER — DIPHENOXYLATE-ATROPINE 2.5-0.025 MG PO TABS: 1.0000 | ORAL_TABLET | Freq: Four times a day (QID) | ORAL | Status: DC | PRN

## 2012-09-28 NOTE — Assessment & Plan Note (Signed)
Labs incl C diff Probiotic Lomotil

## 2012-09-28 NOTE — Progress Notes (Signed)
Subjective:    Diarrhea  This is a new problem. The current episode started in the past 7 days. The problem has been unchanged. Diarrhea characteristics: dark. The patient states that diarrhea awakens (more at night - q2hrs) him from sleep. Pertinent negatives include no coughing. He has tried nothing for the symptoms. The treatment provided no relief.  He took an abx (Amoxicillin) for an abscessed tooth x 7 d a few das prior to diarrhea   The patient presents for a follow-up of  chronic hypertension, chronic dyslipidemia, CHF controlled with medicines. He is getting married  Wt Readings from Last 3 Encounters:  09/28/12 159 lb (72.122 kg)  07/22/12 154 lb 1.9 oz (69.908 kg)  07/13/12 158 lb (71.668 kg)   BP Readings from Last 3 Encounters:  09/28/12 162/80  07/22/12 141/76  07/13/12 128/68      Review of Systems  Constitutional: Negative for appetite change, fatigue and unexpected weight change.  HENT: Negative for nosebleeds, congestion, sore throat, sneezing, trouble swallowing and neck pain.   Eyes: Negative for itching and visual disturbance.  Respiratory: Negative for cough.   Cardiovascular: Negative for chest pain, palpitations and leg swelling.  Gastrointestinal: Positive for diarrhea. Negative for nausea, blood in stool and abdominal distention.  Genitourinary: Negative for frequency and hematuria.  Musculoskeletal: Negative for back pain, joint swelling and gait problem.  Skin: Negative for rash.  Neurological: Negative for dizziness, tremors, speech difficulty and weakness.  Psychiatric/Behavioral: Negative for sleep disturbance, dysphoric mood and agitation. The patient is not nervous/anxious.        Objective:   Physical Exam  Constitutional: He is oriented to person, place, and time. He appears well-developed.  HENT:  Mouth/Throat: Oropharynx is clear and moist.  Eyes: Conjunctivae normal are normal. Pupils are equal, round, and reactive to light.  Neck:  Normal range of motion. No JVD present. No thyromegaly present.  Cardiovascular: Normal rate, regular rhythm, normal heart sounds and intact distal pulses.  Exam reveals no gallop and no friction rub.   No murmur heard. Pulmonary/Chest: Effort normal and breath sounds normal. No respiratory distress. He has no wheezes. He has no rales. He exhibits no tenderness.  Abdominal: Soft. Bowel sounds are normal. He exhibits no distension and no mass. There is no tenderness. There is no rebound and no guarding.  Genitourinary: Rectum normal and prostate normal. Guaiac negative stool.       No stool or blood on glove  Musculoskeletal: Normal range of motion. He exhibits no edema and no tenderness.  Lymphadenopathy:    He has no cervical adenopathy.  Neurological: He is alert and oriented to person, place, and time. He has normal reflexes. No cranial nerve deficit. He exhibits normal muscle tone. Coordination normal.  Skin: Skin is warm and dry. No rash noted.  Psychiatric: He has a normal mood and affect. His behavior is normal. Judgment and thought content normal.     Lab Results  Component Value Date   WBC 9.7 07/07/2012   HGB 12.4* 07/07/2012   HCT 37.4* 07/07/2012   PLT 193.0 07/07/2012   GLUCOSE 93 07/07/2012   CHOL 112 07/07/2012   TRIG 99.0 07/07/2012   HDL 32.80* 07/07/2012   LDLDIRECT 50.1 04/28/2011   LDLCALC 59 07/07/2012   ALT 17 07/07/2012   AST 12 07/07/2012   NA 137 07/07/2012   K 4.9 07/07/2012   CL 102 07/07/2012   CREATININE 1.1 07/07/2012   BUN 18 07/07/2012   CO2 30 07/07/2012  TSH 2.22 07/07/2012   PSA 1.96 07/07/2012   INR 0.9 RATIO 03/24/2007   HGBA1C 6.2 02/13/2010        Assessment & Plan:

## 2012-09-29 ENCOUNTER — Other Ambulatory Visit: Payer: Medicare Other

## 2012-09-29 DIAGNOSIS — R197 Diarrhea, unspecified: Secondary | ICD-10-CM

## 2012-10-25 ENCOUNTER — Encounter: Payer: Self-pay | Admitting: Cardiology

## 2012-10-25 ENCOUNTER — Other Ambulatory Visit: Payer: Self-pay

## 2012-10-25 ENCOUNTER — Ambulatory Visit (INDEPENDENT_AMBULATORY_CARE_PROVIDER_SITE_OTHER): Payer: Medicare Other | Admitting: Cardiology

## 2012-10-25 ENCOUNTER — Ambulatory Visit (INDEPENDENT_AMBULATORY_CARE_PROVIDER_SITE_OTHER): Payer: Medicare Other | Admitting: *Deleted

## 2012-10-25 VITALS — BP 114/62 | HR 70 | Ht 66.5 in | Wt 160.0 lb

## 2012-10-25 DIAGNOSIS — I255 Ischemic cardiomyopathy: Secondary | ICD-10-CM

## 2012-10-25 DIAGNOSIS — Z95810 Presence of automatic (implantable) cardiac defibrillator: Secondary | ICD-10-CM

## 2012-10-25 DIAGNOSIS — I4589 Other specified conduction disorders: Secondary | ICD-10-CM

## 2012-10-25 DIAGNOSIS — E785 Hyperlipidemia, unspecified: Secondary | ICD-10-CM

## 2012-10-25 DIAGNOSIS — I2589 Other forms of chronic ischemic heart disease: Secondary | ICD-10-CM

## 2012-10-25 NOTE — Progress Notes (Signed)
HPI  The patient is seen to followup coronary disease and cardiomyopathy. He actually looks great. He is remarried for 5 months and here with his wife. He is doing very well. He's not having palpitations or chest pain. He was seen by Dr. Graciela Husbands who felt that his ICD was working well. He has not had any significant discharges.  Allergies  Allergen Reactions  . Morphine     "increased white corpuscles"  . Ramipril Other (See Comments)    REACTION: cough if dose is over 2.5mg     Current Outpatient Prescriptions  Medication Sig Dispense Refill  . aspirin 81 MG tablet Take 1 tablet (81 mg total) by mouth daily.  30 tablet  11  . atorvastatin (LIPITOR) 40 MG tablet Take 1 tablet (40 mg total) by mouth daily.  90 tablet  2  . carvedilol (COREG) 25 MG tablet Take 1 tablet (25 mg total) by mouth 2 (two) times daily.  180 tablet  2  . Cholecalciferol 1000 UNITS tablet Take 1,000 Units by mouth daily.        . digoxin (LANOXIN) 0.125 MG tablet Take 1 tablet (125 mcg total) by mouth daily.  90 tablet  2  . diphenoxylate-atropine (LOMOTIL) 2.5-0.025 MG per tablet Take 1 tablet by mouth 4 (four) times daily as needed for diarrhea or loose stools.  20 tablet  0  . nitroGLYCERIN (NITROLINGUAL) 0.4 MG/SPRAY spray Place 1 spray under the tongue as needed.        . ramipril (ALTACE) 2.5 MG capsule Take 1 capsule (2.5 mg total) by mouth daily.  90 capsule  2   No current facility-administered medications for this visit.    History   Social History  . Marital Status: Widowed    Spouse Name: N/A    Number of Children: N/A  . Years of Education: N/A   Occupational History  . Retired    Social History Main Topics  . Smoking status: Former Smoker    Types: Cigars  . Smokeless tobacco: Never Used     Comment: quit 1973  . Alcohol Use: No  . Drug Use: No  . Sexually Active: Not Currently   Other Topics Concern  . Not on file   Social History Narrative   Widowed - 06/2010    Family History   Problem Relation Age of Onset  . Hypertension Other   . Colon cancer Other     1st degree relative <50  . Hypertension Mother   . Hypertension Father     Past Medical History  Diagnosis Date  . History of colon polyps   . Hyperlipidemia   . Hypertension   . 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 S/P CABG (coronary artery bypass graft) 1992  . ICD (implantable cardiac defibrillator) in place 03/2007    July, 2008  . Chronotropic incompetence     treated w/pacemaker, ICD 7.2008. This was placed after CPX done post 2008 cath. CPX showed only chronotropic incompetence  . Arrhythmia   . CHF (congestive heart failure)   . Syncopal episodes     relative hypotension  . Mitral regurgitation     mild  . Numbness and tingling of left arm and leg     improved after tx w/prednisone  . Hx of CABG  1992  . Grief reaction     Patient was a son in weight in 2001  . Arthritis   . Blood transfusion 1969  . Myocardial infarction     Past Surgical History  Procedure Laterality Date  . Coronary artery bypass graft  1992  . Cardiac catheterization    . Cardiac defibrillator placement  2008  . Leg surgery  1942    Fx leg repair with artificial bone    Patient Active Problem List  Diagnosis  . HYPERLIPIDEMIA  . Anemia of other chronic disease  . HYPERTENSION  . CONGESTIVE HEART FAILURE  . BRONCHITIS, ACUTE  . SEBACEOUS CYST  . ARM PAIN  . PARESTHESIA  . COUGH  . COLONIC POLYPS, HX OF  . TOBACCO USE, QUIT  . Ischemic cardiomyopathy  . ICD-St.Jude  . Hx of CABG  . Chronotropic incompetence  . Syncopal episodes  . Mitral regurgitation  . Numbness and tingling of left arm and leg  . Chronic renal insufficiency, stage III (moderate)  . Well adult exam  . Diarrhea    ROS   Patient denies  fever, chills, headache, sweats, rash, change in vision, change in hearing, chest pain, cough, nausea vomiting, urinary symptoms. All other systems are reviewed and are negative.  PHYSICAL EXAM  Patient is here with his wife. He is oriented to person time and place. Affect is normal. There is no jugulovenous distention. Lungs are clear. Respiratory effort is nonlabored. Cardiac exam reveals S1 and S2. There no clicks or significant murmurs. Abdomen is soft. There is no peripheral edema.  Filed Vitals:   10/25/12 0933  BP: 114/62  Pulse: 70  Height: 5' 6.5" (1.689 m)  Weight: 160 lb (72.576 kg)  SpO2: 98%     ASSESSMENT & PLAN

## 2012-10-25 NOTE — Assessment & Plan Note (Signed)
I have not done a recent echo. He's been quite stable. I have reviewed his medicines and decided not to change them.

## 2012-10-25 NOTE — Patient Instructions (Addendum)
Your physician wants you to follow-up in: 6 months  You will receive a reminder letter in the mail two months in advance. If you don't receive a letter, please call our office to schedule the follow-up appointment.  Your physician recommends that you continue on your current medications as directed. Please refer to the Current Medication list given to you today.  

## 2012-10-25 NOTE — Assessment & Plan Note (Signed)
His ICD is working well per Dr. Graciela Husbands.

## 2012-10-25 NOTE — Assessment & Plan Note (Signed)
Chronotropic incompetence was treated with his pacemaker.

## 2012-10-25 NOTE — Assessment & Plan Note (Signed)
His lipids are being treated appropriately. No change in therapy. 

## 2012-10-29 LAB — REMOTE ICD DEVICE
AL AMPLITUDE: 2.3 mv
ATRIAL PACING ICD: 93 pct
BATTERY VOLTAGE: 2.59 V
DEVICE MODEL ICD: 449147
HV IMPEDENCE: 63 Ohm
TZAT-0001FASTVT: 1
TZAT-0001SLOWVT: 1
TZAT-0012SLOWVT: 200 ms
TZAT-0013FASTVT: 1
TZAT-0013SLOWVT: 3
TZAT-0018SLOWVT: NEGATIVE
TZAT-0019SLOWVT: 7.5 V
TZAT-0020FASTVT: 1 ms
TZAT-0020SLOWVT: 1 ms
TZON-0003SLOWVT: 340 ms
TZON-0004SLOWVT: 12
TZON-0005FASTVT: 6
TZON-0005SLOWVT: 6
TZST-0001FASTVT: 2
TZST-0001FASTVT: 5
TZST-0001SLOWVT: 4
TZST-0003FASTVT: 36 J
TZST-0003FASTVT: 36 J
TZST-0003FASTVT: 36 J
TZST-0003SLOWVT: 36 J
VENTRICULAR PACING ICD: 1 pct

## 2012-11-16 ENCOUNTER — Ambulatory Visit: Payer: Medicare Other | Admitting: Internal Medicine

## 2012-11-23 ENCOUNTER — Ambulatory Visit (INDEPENDENT_AMBULATORY_CARE_PROVIDER_SITE_OTHER): Payer: Medicare Other | Admitting: Internal Medicine

## 2012-11-23 ENCOUNTER — Other Ambulatory Visit (INDEPENDENT_AMBULATORY_CARE_PROVIDER_SITE_OTHER): Payer: Medicare Other

## 2012-11-23 ENCOUNTER — Encounter: Payer: Self-pay | Admitting: Internal Medicine

## 2012-11-23 VITALS — BP 140/70 | HR 72 | Temp 97.4°F | Resp 16 | Wt 160.0 lb

## 2012-11-23 DIAGNOSIS — N2889 Other specified disorders of kidney and ureter: Secondary | ICD-10-CM

## 2012-11-23 DIAGNOSIS — I10 Essential (primary) hypertension: Secondary | ICD-10-CM

## 2012-11-23 DIAGNOSIS — I509 Heart failure, unspecified: Secondary | ICD-10-CM

## 2012-11-23 DIAGNOSIS — E785 Hyperlipidemia, unspecified: Secondary | ICD-10-CM

## 2012-11-23 LAB — BASIC METABOLIC PANEL
Chloride: 104 mEq/L (ref 96–112)
GFR: 57.3 mL/min — ABNORMAL LOW (ref >60.00)
Potassium: 4.7 mEq/L (ref 3.5–5.1)

## 2012-11-23 NOTE — Assessment & Plan Note (Signed)
Continue with current prescription therapy as reflected on the Med list.  

## 2012-11-23 NOTE — Progress Notes (Signed)
   Subjective:    HPI   The patient presents for a follow-up of  chronic hypertension, chronic dyslipidemia, CHF controlled with medicines. He is getting married  Wt Readings from Last 3 Encounters:  11/23/12 160 lb (72.576 kg)  10/25/12 160 lb (72.576 kg)  09/28/12 159 lb (72.122 kg)   BP Readings from Last 3 Encounters:  11/23/12 140/70  10/25/12 114/62  09/28/12 162/80      Review of Systems  Constitutional: Negative for appetite change, fatigue and unexpected weight change.  HENT: Negative for nosebleeds, congestion, sore throat, sneezing, trouble swallowing and neck pain.   Eyes: Negative for itching and visual disturbance.  Cardiovascular: Negative for chest pain, palpitations and leg swelling.  Gastrointestinal: Negative for nausea, blood in stool and abdominal distention.  Genitourinary: Negative for frequency and hematuria.  Musculoskeletal: Negative for back pain, joint swelling and gait problem.  Skin: Negative for rash.  Neurological: Negative for dizziness, tremors, speech difficulty and weakness.  Psychiatric/Behavioral: Negative for sleep disturbance, dysphoric mood and agitation. The patient is not nervous/anxious.        Objective:   Physical Exam  Constitutional: He is oriented to person, place, and time. He appears well-developed and well-nourished. No distress.  HENT:  Mouth/Throat: Oropharynx is clear and moist.  Eyes: Conjunctivae are normal. Pupils are equal, round, and reactive to light.  Neck: Normal range of motion. No JVD present. No thyromegaly present.  Cardiovascular: Normal rate, regular rhythm, normal heart sounds and intact distal pulses.  Exam reveals no gallop and no friction rub.   No murmur heard. Pulmonary/Chest: Effort normal and breath sounds normal. No respiratory distress. He has no wheezes. He has no rales. He exhibits no tenderness.  Abdominal: Soft. Bowel sounds are normal. He exhibits no distension and no mass. There is no  tenderness. There is no rebound and no guarding.  Genitourinary: Rectum normal.  Musculoskeletal: Normal range of motion. He exhibits no edema and no tenderness.  Lymphadenopathy:    He has no cervical adenopathy.  Neurological: He is alert and oriented to person, place, and time. He has normal reflexes. No cranial nerve deficit. He exhibits normal muscle tone. Coordination normal.  Skin: Skin is warm and dry. No rash noted.  Psychiatric: He has a normal mood and affect. His behavior is normal. Judgment and thought content normal.     Lab Results  Component Value Date   WBC 9.1 09/28/2012   HGB 13.6 09/28/2012   HCT 40.6 09/28/2012   PLT 228.0 09/28/2012   GLUCOSE 90 09/28/2012   CHOL 112 07/07/2012   TRIG 99.0 07/07/2012   HDL 32.80* 07/07/2012   LDLDIRECT 50.1 04/28/2011   LDLCALC 59 07/07/2012   ALT 17 07/07/2012   AST 12 07/07/2012   NA 137 09/28/2012   K 4.8 09/28/2012   CL 105 09/28/2012   CREATININE 1.1 09/28/2012   BUN 22 09/28/2012   CO2 26 09/28/2012   TSH 2.22 07/07/2012   PSA 1.96 07/07/2012   INR 0.9 RATIO 03/24/2007   HGBA1C 6.2 02/13/2010        Assessment & Plan:

## 2012-11-23 NOTE — Assessment & Plan Note (Signed)
BMET 

## 2013-01-24 ENCOUNTER — Encounter: Payer: Self-pay | Admitting: Internal Medicine

## 2013-01-24 ENCOUNTER — Ambulatory Visit (INDEPENDENT_AMBULATORY_CARE_PROVIDER_SITE_OTHER): Payer: Medicare Other | Admitting: *Deleted

## 2013-01-24 DIAGNOSIS — Z95810 Presence of automatic (implantable) cardiac defibrillator: Secondary | ICD-10-CM

## 2013-01-24 DIAGNOSIS — I2589 Other forms of chronic ischemic heart disease: Secondary | ICD-10-CM

## 2013-01-24 DIAGNOSIS — I255 Ischemic cardiomyopathy: Secondary | ICD-10-CM

## 2013-01-26 LAB — REMOTE ICD DEVICE
AL AMPLITUDE: 2.2 mv
BATTERY VOLTAGE: 2.57 V
DEV-0020ICD: NEGATIVE
DEVICE MODEL ICD: 449147
MODE SWITCH EPISODES: 1
RV LEAD AMPLITUDE: 11.6 mv
RV LEAD IMPEDENCE ICD: 400 Ohm
TZAT-0012SLOWVT: 200 ms
TZAT-0013FASTVT: 1
TZAT-0013SLOWVT: 3
TZAT-0018FASTVT: NEGATIVE
TZAT-0018SLOWVT: NEGATIVE
TZAT-0020FASTVT: 1 ms
TZAT-0020SLOWVT: 1 ms
TZON-0003FASTVT: 300 ms
TZON-0003SLOWVT: 340 ms
TZON-0004SLOWVT: 12
TZON-0005FASTVT: 6
TZON-0005SLOWVT: 6
TZON-0010FASTVT: 80 ms
TZST-0001FASTVT: 2
TZST-0001FASTVT: 4
TZST-0001SLOWVT: 2
TZST-0001SLOWVT: 4
TZST-0001SLOWVT: 5
TZST-0003FASTVT: 36 J
TZST-0003FASTVT: 36 J
TZST-0003SLOWVT: 36 J
VENTRICULAR PACING ICD: 1 pct

## 2013-02-02 ENCOUNTER — Encounter: Payer: Self-pay | Admitting: *Deleted

## 2013-03-29 ENCOUNTER — Ambulatory Visit (INDEPENDENT_AMBULATORY_CARE_PROVIDER_SITE_OTHER): Payer: Medicare Other | Admitting: Internal Medicine

## 2013-03-29 ENCOUNTER — Encounter: Payer: Self-pay | Admitting: Internal Medicine

## 2013-03-29 VITALS — BP 118/68 | HR 60 | Temp 97.0°F | Resp 16 | Wt 162.0 lb

## 2013-03-29 DIAGNOSIS — I255 Ischemic cardiomyopathy: Secondary | ICD-10-CM

## 2013-03-29 DIAGNOSIS — D638 Anemia in other chronic diseases classified elsewhere: Secondary | ICD-10-CM

## 2013-03-29 DIAGNOSIS — I2589 Other forms of chronic ischemic heart disease: Secondary | ICD-10-CM

## 2013-03-29 DIAGNOSIS — N320 Bladder-neck obstruction: Secondary | ICD-10-CM

## 2013-03-29 DIAGNOSIS — I059 Rheumatic mitral valve disease, unspecified: Secondary | ICD-10-CM

## 2013-03-29 DIAGNOSIS — E785 Hyperlipidemia, unspecified: Secondary | ICD-10-CM

## 2013-03-29 DIAGNOSIS — I340 Nonrheumatic mitral (valve) insufficiency: Secondary | ICD-10-CM

## 2013-03-29 DIAGNOSIS — I10 Essential (primary) hypertension: Secondary | ICD-10-CM

## 2013-03-29 NOTE — Assessment & Plan Note (Signed)
Continue with current prescription therapy as reflected on the Med list.  

## 2013-03-29 NOTE — Progress Notes (Signed)
   Subjective:    HPI   The patient presents for a follow-up of  chronic hypertension, chronic dyslipidemia, CHF controlled with medicines. He is married  Wt Readings from Last 3 Encounters:  03/29/13 162 lb (73.483 kg)  11/23/12 160 lb (72.576 kg)  10/25/12 160 lb (72.576 kg)   BP Readings from Last 3 Encounters:  03/29/13 118/68  11/23/12 140/70  10/25/12 114/62      Review of Systems  Constitutional: Negative for appetite change, fatigue and unexpected weight change.  HENT: Negative for nosebleeds, congestion, sore throat, sneezing, trouble swallowing and neck pain.   Eyes: Negative for itching and visual disturbance.  Cardiovascular: Negative for chest pain, palpitations and leg swelling.  Gastrointestinal: Negative for nausea, blood in stool and abdominal distention.  Genitourinary: Negative for frequency and hematuria.  Musculoskeletal: Negative for back pain, joint swelling and gait problem.  Skin: Negative for rash.  Neurological: Negative for dizziness, tremors, speech difficulty and weakness.  Psychiatric/Behavioral: Negative for sleep disturbance, dysphoric mood and agitation. The patient is not nervous/anxious.        Objective:   Physical Exam  Constitutional: He is oriented to person, place, and time. He appears well-developed and well-nourished. No distress.  HENT:  Mouth/Throat: Oropharynx is clear and moist.  Eyes: Conjunctivae are normal. Pupils are equal, round, and reactive to light.  Neck: Normal range of motion. No JVD present. No thyromegaly present.  Cardiovascular: Normal rate, regular rhythm, normal heart sounds and intact distal pulses.  Exam reveals no gallop and no friction rub.   No murmur heard. Pulmonary/Chest: Effort normal and breath sounds normal. No respiratory distress. He has no wheezes. He has no rales. He exhibits no tenderness.  Abdominal: Soft. Bowel sounds are normal. He exhibits no distension and no mass. There is no  tenderness. There is no rebound and no guarding.  Genitourinary: Rectum normal.  Musculoskeletal: Normal range of motion. He exhibits no edema and no tenderness.  Lymphadenopathy:    He has no cervical adenopathy.  Neurological: He is alert and oriented to person, place, and time. He has normal reflexes. No cranial nerve deficit. He exhibits normal muscle tone. Coordination normal.  Skin: Skin is warm and dry. No rash noted.  Psychiatric: He has a normal mood and affect. His behavior is normal. Judgment and thought content normal.     Lab Results  Component Value Date   WBC 9.1 09/28/2012   HGB 13.6 09/28/2012   HCT 40.6 09/28/2012   PLT 228.0 09/28/2012   GLUCOSE 109* 11/23/2012   CHOL 112 07/07/2012   TRIG 99.0 07/07/2012   HDL 32.80* 07/07/2012   LDLDIRECT 50.1 04/28/2011   LDLCALC 59 07/07/2012   ALT 17 07/07/2012   AST 12 07/07/2012   NA 139 11/23/2012   K 4.7 11/23/2012   CL 104 11/23/2012   CREATININE 1.3 11/23/2012   BUN 20 11/23/2012   CO2 29 11/23/2012   TSH 2.22 07/07/2012   PSA 1.96 07/07/2012   INR 0.9 RATIO 03/24/2007   HGBA1C 6.2 02/13/2010        Assessment & Plan:

## 2013-03-29 NOTE — Assessment & Plan Note (Signed)
Monitor

## 2013-04-25 ENCOUNTER — Ambulatory Visit (INDEPENDENT_AMBULATORY_CARE_PROVIDER_SITE_OTHER): Payer: Medicare Other | Admitting: *Deleted

## 2013-04-25 DIAGNOSIS — I2589 Other forms of chronic ischemic heart disease: Secondary | ICD-10-CM

## 2013-04-25 DIAGNOSIS — Z95810 Presence of automatic (implantable) cardiac defibrillator: Secondary | ICD-10-CM

## 2013-04-25 DIAGNOSIS — I255 Ischemic cardiomyopathy: Secondary | ICD-10-CM

## 2013-04-27 LAB — REMOTE ICD DEVICE
AL IMPEDENCE ICD: 380 Ohm
ATRIAL PACING ICD: 94 pct
BATTERY VOLTAGE: 2.57 V
DEVICE MODEL ICD: 449147
HV IMPEDENCE: 68 Ohm
RV LEAD IMPEDENCE ICD: 440 Ohm
TZAT-0001FASTVT: 1
TZAT-0004FASTVT: 8
TZAT-0004SLOWVT: 8
TZAT-0013SLOWVT: 3
TZAT-0018FASTVT: NEGATIVE
TZAT-0018SLOWVT: NEGATIVE
TZAT-0019FASTVT: 7.5 V
TZAT-0020FASTVT: 1 ms
TZON-0003FASTVT: 300 ms
TZON-0003SLOWVT: 340 ms
TZON-0004FASTVT: 12
TZON-0004SLOWVT: 12
TZON-0005FASTVT: 6
TZON-0010FASTVT: 80 ms
TZST-0001FASTVT: 2
TZST-0001FASTVT: 3
TZST-0001FASTVT: 4
TZST-0001SLOWVT: 2
TZST-0001SLOWVT: 3
TZST-0001SLOWVT: 5
TZST-0003FASTVT: 36 J
TZST-0003SLOWVT: 36 J
VENTRICULAR PACING ICD: 1 pct

## 2013-05-05 ENCOUNTER — Ambulatory Visit: Payer: Medicare Other | Admitting: Cardiology

## 2013-05-09 ENCOUNTER — Encounter: Payer: Self-pay | Admitting: Cardiology

## 2013-05-09 ENCOUNTER — Ambulatory Visit (INDEPENDENT_AMBULATORY_CARE_PROVIDER_SITE_OTHER): Payer: Medicare Other | Admitting: Cardiology

## 2013-05-09 VITALS — BP 130/80 | HR 70 | Ht 66.5 in | Wt 163.0 lb

## 2013-05-09 DIAGNOSIS — I2510 Atherosclerotic heart disease of native coronary artery without angina pectoris: Secondary | ICD-10-CM

## 2013-05-09 DIAGNOSIS — I255 Ischemic cardiomyopathy: Secondary | ICD-10-CM

## 2013-05-09 DIAGNOSIS — Z95810 Presence of automatic (implantable) cardiac defibrillator: Secondary | ICD-10-CM

## 2013-05-09 DIAGNOSIS — I2589 Other forms of chronic ischemic heart disease: Secondary | ICD-10-CM

## 2013-05-09 MED ORDER — CARVEDILOL 25 MG PO TABS: 25.0000 mg | ORAL_TABLET | Freq: Two times a day (BID) | ORAL | Status: DC

## 2013-05-09 NOTE — Assessment & Plan Note (Signed)
His ICD is followed carefully by the EP team.

## 2013-05-09 NOTE — Assessment & Plan Note (Signed)
His renal function has been stable. I've chosen not to use spironolactone.

## 2013-05-09 NOTE — Assessment & Plan Note (Signed)
The patient's last echo was September, 2009. We need to repeat the echo to reassess his LV function. This will be scheduled.

## 2013-05-09 NOTE — Patient Instructions (Addendum)
**Note De-identified [name] [name] Obfuscation** Your physician has requested that you have an echocardiogram. Echocardiography is a painless test that uses sound waves to create images of your heart. It provides your doctor with information about the size and shape of your heart and how well your heart's chambers and valves are working. This procedure takes approximately one hour. There are no restrictions for this procedure.   Your physician recommends that you continue on your current medications as directed. Please refer to the Current Medication list given to you today.  Your physician wants you to follow-up in: 6 months. You will receive a reminder letter in the mail two months in advance. If you don't receive a letter, please call our office to schedule the follow-up appointment.  

## 2013-05-09 NOTE — Progress Notes (Signed)
HPI  Patient is seen today to followup his heart he myopathy and coronary disease. He is doing very well. He's here with his wife. His ICD is followed by the electrophysiology team. He is not having chest pain or shortness of breath. In the past I have decided not to use spironolactone. He does have mild renal insufficiency.  Allergies  Allergen Reactions  . Morphine     "increased white corpuscles"  . Ramipril Other (See Comments)    REACTION: cough if dose is over 2.5mg     Current Outpatient Prescriptions  Medication Sig Dispense Refill  . aspirin 81 MG tablet Take 1 tablet (81 mg total) by mouth daily.  30 tablet  11  . atorvastatin (LIPITOR) 40 MG tablet Take 1 tablet (40 mg total) by mouth daily.  90 tablet  2  . carvedilol (COREG) 25 MG tablet Take 1 tablet (25 mg total) by mouth 2 (two) times daily.  180 tablet  2  . Cholecalciferol 1000 UNITS tablet Take 1,000 Units by mouth daily.        . digoxin (LANOXIN) 0.125 MG tablet Take 1 tablet (125 mcg total) by mouth daily.  90 tablet  2  . diphenoxylate-atropine (LOMOTIL) 2.5-0.025 MG per tablet Take 1 tablet by mouth 4 (four) times daily as needed for diarrhea or loose stools.  20 tablet  0  . nitroGLYCERIN (NITROLINGUAL) 0.4 MG/SPRAY spray Place 1 spray under the tongue as needed.        . ramipril (ALTACE) 2.5 MG capsule Take 1 capsule (2.5 mg total) by mouth daily.  90 capsule  2   No current facility-administered medications for this visit.    History   Social History  . Marital Status: Widowed    Spouse Name: N/A    Number of Children: N/A  . Years of Education: N/A   Occupational History  . Retired    Social History Main Topics  . Smoking status: Former Smoker    Types: Cigars  . Smokeless tobacco: Never Used     Comment: quit 1973  . Alcohol Use: No  . Drug Use: No  . Sexual Activity: Not Currently   Other Topics Concern  . Not on file   Social History Narrative   Widowed - 06/2010    Family History   Problem Relation Age of Onset  . Hypertension Other   . Colon cancer Other     1st degree relative <50  . Hypertension Mother   . Hypertension Father     Past Medical History  Diagnosis Date  . History of colon polyps   . Hyperlipidemia   . Hypertension   . 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 S/P CABG (coronary artery bypass graft) 1992  . ICD (implantable cardiac defibrillator) in place 03/2007    July, 2008  . Chronotropic incompetence     treated w/pacemaker, ICD 7.2008. This was placed after CPX done post 2008 cath. CPX showed only chronotropic incompetence  . Arrhythmia   . CHF (congestive heart failure)   . Syncopal episodes     relative hypotension  . Mitral regurgitation     mild  . Numbness and tingling of left arm and leg     improved after tx w/prednisone  . Hx of CABG  1992  . Grief reaction     Patient was a son in weight in 2001  . Arthritis   . Blood transfusion 1969  . Myocardial infarction     Past Surgical History  Procedure Laterality Date  . Coronary artery bypass graft  1992  . Cardiac catheterization    . Cardiac defibrillator placement  2008  . Leg surgery  1942    Fx leg repair with artificial bone    Patient Active Problem List   Diagnosis Date Noted  . Hx of CABG     Priority: High  . Chronotropic incompetence     Priority: High  . ICD-St.Jude 03/16/2007    Priority: High  . Diarrhea 09/28/2012  . Well adult exam 07/13/2012  . Chronic renal insufficiency, stage III (moderate) 07/21/2011  . Ischemic cardiomyopathy   . Syncopal episodes   . Mitral regurgitation   . Numbness and tingling of left arm and leg   . BRONCHITIS, ACUTE 11/19/2009  . ARM PAIN 10/22/2009  . PARESTHESIA 10/22/2009  . TOBACCO USE, QUIT 10/22/2009  . SEBACEOUS CYST  08/27/2009  . COUGH 02/28/2009  . Anemia of other chronic disease 02/18/2008  . CONGESTIVE HEART FAILURE 08/19/2007  . HYPERLIPIDEMIA 05/03/2007  . HYPERTENSION 05/03/2007  . COLONIC POLYPS, HX OF 05/03/2007    ROS   Patient denies fever, chills, headache, sweats, rash, change in vision, change in hearing, chest pain, cough, nausea vomiting, urinary symptoms. All other systems are reviewed and are negative.  PHYSICAL EXAM   Patient is oriented to person time and place. Affect is normal. He's here with his wife. There is no jugulovenous distention. Lungs are clear. Respiratory effort is nonlabored. Cardiac exam reveals an S1 and S2. Abdomen is soft. There is no peripheral edema.  Filed Vitals:   05/09/13 1003  BP: 130/80  Pulse: 70  Height: 5' 6.5" (1.689 m)  Weight: 163 lb (73.936 kg)   EKG is done today and reviewed by me. There is atrial pacing. There is normal function.  ASSESSMENT & PLAN

## 2013-05-09 NOTE — Assessment & Plan Note (Signed)
Coronary disease is stable. Nuclear scan was done in 2010.

## 2013-05-17 ENCOUNTER — Encounter: Payer: Self-pay | Admitting: *Deleted

## 2013-05-19 ENCOUNTER — Other Ambulatory Visit (HOSPITAL_COMMUNITY): Payer: Medicare Other

## 2013-05-30 ENCOUNTER — Other Ambulatory Visit: Payer: Self-pay | Admitting: Cardiology

## 2013-05-31 ENCOUNTER — Other Ambulatory Visit: Payer: Self-pay

## 2013-05-31 ENCOUNTER — Ambulatory Visit (HOSPITAL_COMMUNITY): Payer: Medicare Other | Attending: Cardiology | Admitting: Radiology

## 2013-05-31 DIAGNOSIS — I509 Heart failure, unspecified: Secondary | ICD-10-CM | POA: Insufficient documentation

## 2013-05-31 DIAGNOSIS — E785 Hyperlipidemia, unspecified: Secondary | ICD-10-CM | POA: Insufficient documentation

## 2013-05-31 DIAGNOSIS — I255 Ischemic cardiomyopathy: Secondary | ICD-10-CM

## 2013-05-31 DIAGNOSIS — I25810 Atherosclerosis of coronary artery bypass graft(s) without angina pectoris: Secondary | ICD-10-CM | POA: Insufficient documentation

## 2013-05-31 DIAGNOSIS — I10 Essential (primary) hypertension: Secondary | ICD-10-CM | POA: Insufficient documentation

## 2013-05-31 DIAGNOSIS — I252 Old myocardial infarction: Secondary | ICD-10-CM | POA: Insufficient documentation

## 2013-05-31 DIAGNOSIS — I2589 Other forms of chronic ischemic heart disease: Secondary | ICD-10-CM | POA: Insufficient documentation

## 2013-05-31 DIAGNOSIS — Z87891 Personal history of nicotine dependence: Secondary | ICD-10-CM | POA: Insufficient documentation

## 2013-05-31 NOTE — Progress Notes (Signed)
Echocardiogram performed.  

## 2013-06-01 ENCOUNTER — Encounter: Payer: Self-pay | Admitting: Cardiology

## 2013-06-01 DIAGNOSIS — R9430 Abnormal result of cardiovascular function study, unspecified: Secondary | ICD-10-CM | POA: Insufficient documentation

## 2013-06-13 ENCOUNTER — Ambulatory Visit (INDEPENDENT_AMBULATORY_CARE_PROVIDER_SITE_OTHER): Payer: Medicare Other | Admitting: Internal Medicine

## 2013-06-13 ENCOUNTER — Encounter: Payer: Self-pay | Admitting: Internal Medicine

## 2013-06-13 VITALS — BP 150/88 | HR 80 | Temp 98.5°F | Resp 16 | Wt 163.0 lb

## 2013-06-13 DIAGNOSIS — I10 Essential (primary) hypertension: Secondary | ICD-10-CM

## 2013-06-13 DIAGNOSIS — I509 Heart failure, unspecified: Secondary | ICD-10-CM

## 2013-06-13 MED ORDER — PROMETHAZINE-CODEINE 6.25-10 MG/5ML PO SYRP: 5.0000 mL | ORAL_SOLUTION | ORAL | Status: DC | PRN

## 2013-06-13 MED ORDER — AZITHROMYCIN 250 MG PO TABS: ORAL_TABLET | ORAL | Status: DC

## 2013-06-13 NOTE — Assessment & Plan Note (Signed)
Continue with current prescription therapy as reflected on the Med list.  

## 2013-06-13 NOTE — Progress Notes (Signed)
   Subjective:    HPI  C/o cough, ST, fever x 1 wk  The patient presents for a follow-up of  chronic hypertension, chronic dyslipidemia, CHF controlled with medicines. He is married  Wt Readings from Last 3 Encounters:  06/13/13 163 lb (73.936 kg)  05/09/13 163 lb (73.936 kg)  03/29/13 162 lb (73.483 kg)   BP Readings from Last 3 Encounters:  06/13/13 150/88  05/09/13 130/80  03/29/13 118/68      Review of Systems  Constitutional: Negative for appetite change, fatigue and unexpected weight change.  HENT: Negative for nosebleeds, congestion, sore throat, sneezing, trouble swallowing and neck pain.   Eyes: Negative for itching and visual disturbance.  Cardiovascular: Negative for chest pain, palpitations and leg swelling.  Gastrointestinal: Negative for nausea, blood in stool and abdominal distention.  Genitourinary: Negative for frequency and hematuria.  Musculoskeletal: Negative for back pain, joint swelling and gait problem.  Skin: Negative for rash.  Neurological: Negative for dizziness, tremors, speech difficulty and weakness.  Psychiatric/Behavioral: Negative for sleep disturbance, dysphoric mood and agitation. The patient is not nervous/anxious.        Objective:   Physical Exam  Constitutional: He is oriented to person, place, and time. He appears well-developed and well-nourished. No distress.  HENT:  Mouth/Throat: Oropharynx is clear and moist.  Eyes: Conjunctivae are normal. Pupils are equal, round, and reactive to light.  Neck: Normal range of motion. No JVD present. No thyromegaly present.  Cardiovascular: Normal rate, regular rhythm, normal heart sounds and intact distal pulses.  Exam reveals no gallop and no friction rub.   No murmur heard. Pulmonary/Chest: Effort normal and breath sounds normal. No respiratory distress. He has no wheezes. He has no rales. He exhibits no tenderness.  Abdominal: Soft. Bowel sounds are normal. He exhibits no distension and no  mass. There is no tenderness. There is no rebound and no guarding.  Genitourinary: Rectum normal.  Musculoskeletal: Normal range of motion. He exhibits no edema and no tenderness.  Lymphadenopathy:    He has no cervical adenopathy.  Neurological: He is alert and oriented to person, place, and time. He has normal reflexes. No cranial nerve deficit. He exhibits normal muscle tone. Coordination normal.  Skin: Skin is warm and dry. No rash noted.  Psychiatric: He has a normal mood and affect. His behavior is normal. Judgment and thought content normal.     Lab Results  Component Value Date   WBC 9.1 09/28/2012   HGB 13.6 09/28/2012   HCT 40.6 09/28/2012   PLT 228.0 09/28/2012   GLUCOSE 109* 11/23/2012   CHOL 112 07/07/2012   TRIG 99.0 07/07/2012   HDL 32.80* 07/07/2012   LDLDIRECT 50.1 04/28/2011   LDLCALC 59 07/07/2012   ALT 17 07/07/2012   AST 12 07/07/2012   NA 139 11/23/2012   K 4.7 11/23/2012   CL 104 11/23/2012   CREATININE 1.3 11/23/2012   BUN 20 11/23/2012   CO2 29 11/23/2012   TSH 2.22 07/07/2012   PSA 1.96 07/07/2012   INR 0.9 RATIO 03/24/2007   HGBA1C 6.2 02/13/2010        Assessment & Plan:

## 2013-06-13 NOTE — Patient Instructions (Signed)
Use over-the-counter  "cold" medicines  such as  "Afrin" nasal spray for nasal congestion as directed instead. Use" Delsym" or" Robitussin" cough syrup varietis for cough.  You can use plain "Tylenol" or "Advil" for fever, chills and achyness.  Please, make an appointment if you are not better or if you're worse.  

## 2013-06-13 NOTE — Assessment & Plan Note (Signed)
Doing well Continue with current prescription therapy as reflected on the Med list.  

## 2013-06-21 ENCOUNTER — Encounter: Payer: Self-pay | Admitting: Internal Medicine

## 2013-07-29 ENCOUNTER — Ambulatory Visit (INDEPENDENT_AMBULATORY_CARE_PROVIDER_SITE_OTHER): Payer: Medicare Other | Admitting: Internal Medicine

## 2013-07-29 ENCOUNTER — Encounter: Payer: Self-pay | Admitting: Internal Medicine

## 2013-07-29 ENCOUNTER — Other Ambulatory Visit (INDEPENDENT_AMBULATORY_CARE_PROVIDER_SITE_OTHER): Payer: Medicare Other

## 2013-07-29 VITALS — BP 120/60 | HR 68 | Temp 97.6°F | Resp 16 | Wt 163.0 lb

## 2013-07-29 DIAGNOSIS — I2589 Other forms of chronic ischemic heart disease: Secondary | ICD-10-CM

## 2013-07-29 DIAGNOSIS — D638 Anemia in other chronic diseases classified elsewhere: Secondary | ICD-10-CM

## 2013-07-29 DIAGNOSIS — N320 Bladder-neck obstruction: Secondary | ICD-10-CM

## 2013-07-29 DIAGNOSIS — I2510 Atherosclerotic heart disease of native coronary artery without angina pectoris: Secondary | ICD-10-CM

## 2013-07-29 DIAGNOSIS — I255 Ischemic cardiomyopathy: Secondary | ICD-10-CM

## 2013-07-29 DIAGNOSIS — E785 Hyperlipidemia, unspecified: Secondary | ICD-10-CM

## 2013-07-29 DIAGNOSIS — I4589 Other specified conduction disorders: Secondary | ICD-10-CM

## 2013-07-29 DIAGNOSIS — R197 Diarrhea, unspecified: Secondary | ICD-10-CM

## 2013-07-29 DIAGNOSIS — I340 Nonrheumatic mitral (valve) insufficiency: Secondary | ICD-10-CM

## 2013-07-29 DIAGNOSIS — I059 Rheumatic mitral valve disease, unspecified: Secondary | ICD-10-CM

## 2013-07-29 DIAGNOSIS — I10 Essential (primary) hypertension: Secondary | ICD-10-CM

## 2013-07-29 DIAGNOSIS — Z125 Encounter for screening for malignant neoplasm of prostate: Secondary | ICD-10-CM

## 2013-07-29 LAB — BASIC METABOLIC PANEL
BUN: 16 mg/dL (ref 6–23)
CO2: 28 mEq/L (ref 19–32)
Calcium: 9.3 mg/dL (ref 8.4–10.5)
Chloride: 103 mEq/L (ref 96–112)
Creatinine, Ser: 1.1 mg/dL (ref 0.4–1.5)
Glucose, Bld: 104 mg/dL — ABNORMAL HIGH (ref 70–99)

## 2013-07-29 LAB — URINALYSIS
Bilirubin Urine: NEGATIVE
Hgb urine dipstick: NEGATIVE
Ketones, ur: NEGATIVE
Nitrite: NEGATIVE
Specific Gravity, Urine: 1.02 (ref 1.000–1.030)
Total Protein, Urine: NEGATIVE
Urine Glucose: NEGATIVE
Urobilinogen, UA: 0.2 (ref 0.0–1.0)

## 2013-07-29 LAB — CBC WITH DIFFERENTIAL/PLATELET
Basophils Relative: 1 % (ref 0.0–3.0)
Eosinophils Absolute: 0.2 10*3/uL (ref 0.0–0.7)
Eosinophils Relative: 2.6 % (ref 0.0–5.0)
HCT: 41 % (ref 39.0–52.0)
Hemoglobin: 13.6 g/dL (ref 13.0–17.0)
MCHC: 33.1 g/dL (ref 30.0–36.0)
MCV: 86 fl (ref 78.0–100.0)
Monocytes Relative: 7.8 % (ref 3.0–12.0)
Neutro Abs: 6.3 10*3/uL (ref 1.4–7.7)
Neutrophils Relative %: 75.5 % (ref 43.0–77.0)
Platelets: 207 10*3/uL (ref 150.0–400.0)
RBC: 4.77 Mil/uL (ref 4.22–5.81)
WBC: 8.4 10*3/uL (ref 4.5–10.5)

## 2013-07-29 LAB — LIPID PANEL
LDL Cholesterol: 50 mg/dL (ref 0–99)
Total CHOL/HDL Ratio: 4
Triglycerides: 178 mg/dL — ABNORMAL HIGH (ref 0.0–149.0)
VLDL: 35.6 mg/dL (ref 0.0–40.0)

## 2013-07-29 LAB — HEPATIC FUNCTION PANEL
ALT: 17 U/L (ref 0–53)
AST: 18 U/L (ref 0–37)
Albumin: 4 g/dL (ref 3.5–5.2)
Alkaline Phosphatase: 97 U/L (ref 39–117)
Total Bilirubin: 1 mg/dL (ref 0.3–1.2)

## 2013-07-29 NOTE — Assessment & Plan Note (Signed)
Infectious - reolved

## 2013-07-29 NOTE — Assessment & Plan Note (Signed)
Continue with current prescription therapy as reflected on the Med list.  

## 2013-07-29 NOTE — Progress Notes (Signed)
   Subjective:    HPI    The patient presents for a follow-up of  chronic hypertension, chronic dyslipidemia, CHF controlled with medicines. He is married  Wt Readings from Last 3 Encounters:  07/29/13 163 lb (73.936 kg)  06/13/13 163 lb (73.936 kg)  05/09/13 163 lb (73.936 kg)   BP Readings from Last 3 Encounters:  07/29/13 120/60  06/13/13 150/88  05/09/13 130/80      Review of Systems  Constitutional: Negative for appetite change, fatigue and unexpected weight change.  HENT: Negative for congestion, nosebleeds, sneezing, sore throat and trouble swallowing.   Eyes: Negative for itching and visual disturbance.  Cardiovascular: Negative for chest pain, palpitations and leg swelling.  Gastrointestinal: Negative for nausea, blood in stool and abdominal distention.  Genitourinary: Negative for frequency and hematuria.  Musculoskeletal: Negative for back pain, gait problem, joint swelling and neck pain.  Skin: Negative for rash.  Neurological: Negative for dizziness, tremors, speech difficulty and weakness.  Psychiatric/Behavioral: Negative for sleep disturbance, dysphoric mood and agitation. The patient is not nervous/anxious.        Objective:   Physical Exam  Constitutional: He is oriented to person, place, and time. He appears well-developed and well-nourished. No distress.  HENT:  Mouth/Throat: Oropharynx is clear and moist.  Eyes: Conjunctivae are normal. Pupils are equal, round, and reactive to light.  Neck: Normal range of motion. No JVD present. No thyromegaly present.  Cardiovascular: Normal rate, regular rhythm, normal heart sounds and intact distal pulses.  Exam reveals no gallop and no friction rub.   No murmur heard. Pulmonary/Chest: Effort normal and breath sounds normal. No respiratory distress. He has no wheezes. He has no rales. He exhibits no tenderness.  Abdominal: Soft. Bowel sounds are normal. He exhibits no distension and no mass. There is no  tenderness. There is no rebound and no guarding.  Genitourinary: Rectum normal.  Musculoskeletal: Normal range of motion. He exhibits no edema and no tenderness.  Lymphadenopathy:    He has no cervical adenopathy.  Neurological: He is alert and oriented to person, place, and time. He has normal reflexes. No cranial nerve deficit. He exhibits normal muscle tone. Coordination normal.  Skin: Skin is warm and dry. No rash noted.  Psychiatric: He has a normal mood and affect. His behavior is normal. Judgment and thought content normal.     Lab Results  Component Value Date   WBC 9.1 09/28/2012   HGB 13.6 09/28/2012   HCT 40.6 09/28/2012   PLT 228.0 09/28/2012   GLUCOSE 109* 11/23/2012   CHOL 112 07/07/2012   TRIG 99.0 07/07/2012   HDL 32.80* 07/07/2012   LDLDIRECT 50.1 04/28/2011   LDLCALC 59 07/07/2012   ALT 17 07/07/2012   AST 12 07/07/2012   NA 139 11/23/2012   K 4.7 11/23/2012   CL 104 11/23/2012   CREATININE 1.3 11/23/2012   BUN 20 11/23/2012   CO2 29 11/23/2012   TSH 2.22 07/07/2012   PSA 1.96 07/07/2012   INR 0.9 RATIO 03/24/2007   HGBA1C 6.2 02/13/2010        Assessment & Plan:

## 2013-07-29 NOTE — Assessment & Plan Note (Signed)
Doing well 

## 2013-07-29 NOTE — Progress Notes (Signed)
Pre visit review using our clinic review tool, if applicable. No additional management support is needed unless otherwise documented below in the visit note. 

## 2013-08-01 ENCOUNTER — Telehealth: Payer: Self-pay

## 2013-08-01 NOTE — Telephone Encounter (Signed)
Message copied by Eulis Manly on Mon Aug 01, 2013  9:24 AM ------      Message from: Tresa Garter      Created: Sat Jul 30, 2013 10:05 AM       Misty Stanley, please, inform patient that all labs are OK      Thank you!       ------

## 2013-08-01 NOTE — Telephone Encounter (Signed)
Patient informed of results.  

## 2013-08-08 ENCOUNTER — Telehealth: Payer: Self-pay | Admitting: Cardiology

## 2013-08-08 NOTE — Telephone Encounter (Signed)
Received request from Nurse fax box, documents faxed for surgical clearance. To: [facility] Fax number: [phone] Attention: 08/08/13/KM

## 2013-08-18 ENCOUNTER — Encounter: Payer: Medicare Other | Admitting: Internal Medicine

## 2013-09-19 ENCOUNTER — Encounter: Payer: Medicare Other | Admitting: Internal Medicine

## 2013-10-21 ENCOUNTER — Other Ambulatory Visit: Payer: Self-pay | Admitting: Cardiology

## 2013-10-31 ENCOUNTER — Encounter: Payer: Medicare Other | Admitting: Internal Medicine

## 2013-11-18 ENCOUNTER — Ambulatory Visit (INDEPENDENT_AMBULATORY_CARE_PROVIDER_SITE_OTHER): Payer: Medicare Other | Admitting: Cardiology

## 2013-11-18 ENCOUNTER — Encounter (INDEPENDENT_AMBULATORY_CARE_PROVIDER_SITE_OTHER): Payer: Self-pay

## 2013-11-18 ENCOUNTER — Encounter: Payer: Self-pay | Admitting: Cardiology

## 2013-11-18 ENCOUNTER — Encounter: Payer: Self-pay | Admitting: Internal Medicine

## 2013-11-18 ENCOUNTER — Ambulatory Visit (INDEPENDENT_AMBULATORY_CARE_PROVIDER_SITE_OTHER): Payer: Medicare Other | Admitting: *Deleted

## 2013-11-18 VITALS — BP 128/66 | HR 88 | Ht 67.0 in | Wt 168.4 lb

## 2013-11-18 DIAGNOSIS — I428 Other cardiomyopathies: Secondary | ICD-10-CM

## 2013-11-18 DIAGNOSIS — I255 Ischemic cardiomyopathy: Secondary | ICD-10-CM

## 2013-11-18 DIAGNOSIS — I2510 Atherosclerotic heart disease of native coronary artery without angina pectoris: Secondary | ICD-10-CM

## 2013-11-18 DIAGNOSIS — E785 Hyperlipidemia, unspecified: Secondary | ICD-10-CM

## 2013-11-18 DIAGNOSIS — I509 Heart failure, unspecified: Secondary | ICD-10-CM

## 2013-11-18 DIAGNOSIS — I2589 Other forms of chronic ischemic heart disease: Secondary | ICD-10-CM

## 2013-11-18 DIAGNOSIS — Z95810 Presence of automatic (implantable) cardiac defibrillator: Secondary | ICD-10-CM

## 2013-11-18 DIAGNOSIS — I10 Essential (primary) hypertension: Secondary | ICD-10-CM

## 2013-11-18 DIAGNOSIS — I5022 Chronic systolic (congestive) heart failure: Secondary | ICD-10-CM | POA: Insufficient documentation

## 2013-11-18 LAB — MDC_IDC_ENUM_SESS_TYPE_INCLINIC
Brady Statistic RA Percent Paced: 95 %
HIGH POWER IMPEDANCE MEASURED VALUE: 62 Ohm
Implantable Pulse Generator Serial Number: 449147
Lead Channel Impedance Value: 390 Ohm
Lead Channel Impedance Value: 410 Ohm
Lead Channel Pacing Threshold Amplitude: 0.75 V
Lead Channel Pacing Threshold Pulse Width: 0.4 ms
Lead Channel Sensing Intrinsic Amplitude: 11.6 mV
Lead Channel Sensing Intrinsic Amplitude: 2.6 mV
Lead Channel Setting Pacing Amplitude: 2.5 V
Lead Channel Setting Pacing Pulse Width: 0.4 ms
Lead Channel Setting Sensing Sensitivity: 0.3 mV
MDC IDC MSMT LEADCHNL RA PACING THRESHOLD AMPLITUDE: 0.75 V
MDC IDC MSMT LEADCHNL RV PACING THRESHOLD PULSEWIDTH: 0.4 ms
MDC IDC SET LEADCHNL RA PACING AMPLITUDE: 2 V
MDC IDC SET ZONE DETECTION INTERVAL: 340 ms
MDC IDC STAT BRADY RV PERCENT PACED: 1 %
Zone Setting Detection Interval: 250 ms
Zone Setting Detection Interval: 300 ms

## 2013-11-18 NOTE — Assessment & Plan Note (Signed)
His lipids are being treated properly. No change in therapy.

## 2013-11-18 NOTE — Assessment & Plan Note (Signed)
Blood pressure is controlled. No change in therapy. 

## 2013-11-18 NOTE — Assessment & Plan Note (Signed)
Patient has an ICD in place. He will be interrogated today. We will make plans for his ICD followup to be in [place] office. This had been transferred to the [facility] office, but the patient and his wife would like to have it done here.

## 2013-11-18 NOTE — Assessment & Plan Note (Signed)
Coronary disease is stable. No further workup at this time. His last nuclear study was October, 2010. This may be considered in the future.

## 2013-11-18 NOTE — Assessment & Plan Note (Signed)
His volume status is stable. No change in therapy. 

## 2013-11-18 NOTE — Patient Instructions (Addendum)
**Note De-Identified Alcide Memoli Obfuscation** Your physician recommends that you continue on your current medications as directed. Please refer to the Current Medication list given to you today.  Your physician recommends that you schedule a follow-up appointment in: 3 months with Dr Caryl Comes   Your physician wants you to follow-up in: 6 months. You will receive a reminder letter in the mail two months in advance. If you don't receive a letter, please call our office to schedule the follow-up appointment.

## 2013-11-18 NOTE — Assessment & Plan Note (Signed)
He has an ICD in place. He is on appropriate medications that I have titrated over time. Spironolactone has been considered but over time the final decision was to not use it.

## 2013-11-18 NOTE — Progress Notes (Signed)
HPI  Patient is seen today to followup his overall cardiac status. I saw him last August, 2014. After that time he had an echo in September, 2014. His ejection fraction was stable at 35-40%. His ICD is followed by the electrophysiology team. Due to scheduling difficulties, his ICD has not been assessed in the [place] office. The patient and his wife would like for his ICD to be followed in the [place] office.  Allergies  Allergen Reactions  . Morphine     "increased white corpuscles"  . Ramipril Other (See Comments)    REACTION: cough if dose is over 2.5mg     Current Outpatient Prescriptions  Medication Sig Dispense Refill  . aspirin 81 MG tablet Take 1 tablet (81 mg total) by mouth daily.  30 tablet  11  . atorvastatin (LIPITOR) 40 MG tablet TAKE 1 TABLET DAILY  90 tablet  0  . carvedilol (COREG) 25 MG tablet Take 1 tablet (25 mg total) by mouth 2 (two) times daily.  180 tablet  3  . Cholecalciferol 1000 UNITS tablet Take 1,000 Units by mouth daily.        Marland Kitchen LANOXIN 125 MCG tablet TAKE 1 TABLET DAILY  90 tablet  0  . nitroGLYCERIN (NITROLINGUAL) 0.4 MG/SPRAY spray Place 1 spray under the tongue as needed.        . ramipril (ALTACE) 2.5 MG capsule TAKE 1 CAPSULE DAILY  90 capsule  0   No current facility-administered medications for this visit.    History   Social History  . Marital Status: Married    Spouse Name: N/A    Number of Children: N/A  . Years of Education: N/A   Occupational History  . Retired    Social History Main Topics  . Smoking status: Former Smoker    Types: Cigars  . Smokeless tobacco: Never Used     Comment: quit 1973  . Alcohol Use: No  . Drug Use: No  . Sexual Activity: Not on file   Other Topics Concern  . Not on file   Social History Narrative   Widowed - 06/2010    Family History  Problem Relation Age of Onset  . Hypertension Other   . Colon cancer Other     1st degree relative <50  . Hypertension Mother   . Hypertension Father       Past Medical History  Diagnosis Date  . History of colon polyps   . Hyperlipidemia   . Hypertension   . 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 S/P CABG (coronary artery bypass graft) 1992  . ICD (implantable cardiac defibrillator) in place 03/2007    July, 2008  . Chronotropic incompetence     treated w/pacemaker, ICD 7.2008. This was placed after CPX done post 2008 cath. CPX showed only chronotropic incompetence  . Arrhythmia   . CHF (congestive heart failure)   . Syncopal episodes     relative hypotension  . Mitral regurgitation     mild  . Numbness and tingling of left arm and leg     improved after tx w/prednisone  . Hx of CABG     1992  . Grief reaction     Patient was a son in weight in 2001  . Arthritis   . Blood transfusion 1969  .  Myocardial infarction   . Ejection fraction     .    Past Surgical History  Procedure Laterality Date  . Coronary artery bypass graft  1992  . Cardiac catheterization    . Cardiac defibrillator placement  2008  . Leg surgery  1942    Fx leg repair with artificial bone  . Cataract extraction  12/14 & 1/15    right eye in December and Left eye in January    Patient Active Problem List   Diagnosis Date Noted  . Hx of CABG     Priority: High  . Chronotropic incompetence     Priority: High  . Automatic implantable cardioverter-defibrillator in situ 03/16/2007    Priority: High  . Ejection fraction   . CAD (coronary artery disease)   . Well adult exam 07/13/2012  . Chronic renal insufficiency, stage III (moderate) 07/21/2011  . Ischemic cardiomyopathy   . Syncopal episodes   . Mitral regurgitation   . Numbness and tingling of left arm and leg   . BRONCHITIS, ACUTE 11/19/2009  . ARM PAIN 10/22/2009  . PARESTHESIA 10/22/2009  . TOBACCO USE,  QUIT 10/22/2009  . SEBACEOUS CYST 08/27/2009  . COUGH 02/28/2009  . Anemia of other chronic disease 02/18/2008  . CONGESTIVE HEART FAILURE 08/19/2007  . HYPERLIPIDEMIA 05/03/2007  . HYPERTENSION 05/03/2007  . COLONIC POLYPS, HX OF 05/03/2007    ROS   Patient denies fever, chills, headache, sweats, rash, change in vision, change in hearing, chest pain, cough, nausea or vomiting, urinary symptoms. All other systems are reviewed and are negative.  PHYSICAL EXAM  Patient is oriented to person time and place. Affect is normal. He's here with his wife. He is doing very well. There is no jugulovenous distention. Lungs are clear. Respiratory effort is nonlabored. Cardiac exam reveals S1 and S2. There no clicks or significant murmurs. The abdomen is soft. Is no peripheral edema.  Filed Vitals:   11/18/13 1102  BP: 128/66  Pulse: 88  Height: 5\' 7"  (1.702 m)  Weight: 168 lb 6.4 oz (76.386 kg)     ASSESSMENT & PLAN

## 2013-12-07 ENCOUNTER — Encounter: Payer: Medicare Other | Admitting: Internal Medicine

## 2014-01-02 ENCOUNTER — Other Ambulatory Visit: Payer: Self-pay | Admitting: Cardiology

## 2014-02-14 ENCOUNTER — Encounter: Payer: Self-pay | Admitting: Internal Medicine

## 2014-02-14 ENCOUNTER — Ambulatory Visit (INDEPENDENT_AMBULATORY_CARE_PROVIDER_SITE_OTHER): Payer: Medicare Other | Admitting: Internal Medicine

## 2014-02-14 VITALS — BP 167/91 | HR 70 | Ht 67.0 in | Wt 160.0 lb

## 2014-02-14 DIAGNOSIS — I2589 Other forms of chronic ischemic heart disease: Secondary | ICD-10-CM

## 2014-02-14 DIAGNOSIS — Z95810 Presence of automatic (implantable) cardiac defibrillator: Secondary | ICD-10-CM

## 2014-02-14 DIAGNOSIS — I509 Heart failure, unspecified: Secondary | ICD-10-CM

## 2014-02-14 DIAGNOSIS — I255 Ischemic cardiomyopathy: Secondary | ICD-10-CM

## 2014-02-14 LAB — MDC_IDC_ENUM_SESS_TYPE_INCLINIC
Brady Statistic RA Percent Paced: 94 %
Brady Statistic RV Percent Paced: 0.96 %
Date Time Interrogation Session: 20150602130651
HIGH POWER IMPEDANCE MEASURED VALUE: 55.125
Lead Channel Impedance Value: 412.5 Ohm
Lead Channel Pacing Threshold Amplitude: 0.75 V
Lead Channel Pacing Threshold Amplitude: 0.75 V
Lead Channel Pacing Threshold Pulse Width: 0.4 ms
Lead Channel Pacing Threshold Pulse Width: 0.4 ms
Lead Channel Pacing Threshold Pulse Width: 0.4 ms
Lead Channel Sensing Intrinsic Amplitude: 11.6 mV
MDC IDC MSMT BATTERY REMAINING LONGEVITY: 6.1 mo
MDC IDC MSMT BATTERY VOLTAGE: 2.51 V
MDC IDC MSMT LEADCHNL RA IMPEDANCE VALUE: 337.5 Ohm
MDC IDC MSMT LEADCHNL RA PACING THRESHOLD AMPLITUDE: 0.75 V
MDC IDC MSMT LEADCHNL RA PACING THRESHOLD PULSEWIDTH: 0.4 ms
MDC IDC MSMT LEADCHNL RA SENSING INTR AMPL: 2.2 mV
MDC IDC MSMT LEADCHNL RV PACING THRESHOLD AMPLITUDE: 0.75 V
MDC IDC PG SERIAL: 449147
MDC IDC SET LEADCHNL RA PACING AMPLITUDE: 2 V
MDC IDC SET LEADCHNL RV PACING AMPLITUDE: 2.5 V
MDC IDC SET LEADCHNL RV PACING PULSEWIDTH: 0.4 ms
MDC IDC SET LEADCHNL RV SENSING SENSITIVITY: 0.3 mV
MDC IDC SET ZONE DETECTION INTERVAL: 340 ms
Zone Setting Detection Interval: 250 ms
Zone Setting Detection Interval: 300 ms

## 2014-02-14 LAB — BASIC METABOLIC PANEL
BUN: 14 mg/dL (ref 6–23)
CALCIUM: 9.4 mg/dL (ref 8.4–10.5)
CO2: 26 meq/L (ref 19–32)
CREATININE: 1 mg/dL (ref 0.4–1.5)
Chloride: 105 mEq/L (ref 96–112)
GFR: 72.59 mL/min (ref >60.00)
GLUCOSE: 92 mg/dL (ref 70–99)
Potassium: 4 mEq/L (ref 3.5–5.1)
SODIUM: 140 meq/L (ref 135–145)

## 2014-02-14 NOTE — Progress Notes (Signed)
Patient Care Team: Cassandria Anger, MD as PCP - General [name] Bjornstad, MD (Cardiology) Deboraha Sprang, MD (Cardiology)   HPI  [name] Everett. is a 80 y.o. male seen in followup for an ICD implanted for primary prevention in the setting of ischemic heart disease.    patient denies chest pain, shortness of breath, nocturnal dyspnea, orthopnea or peripheral edema. There have been no palpitations, lightheadedness or syncope.  He recently went to see his son who lives in [place].  He has recently got married and now lives on her horse farm in [place]   Past Medical History  Diagnosis Date  . History of colon polyps   . Hyperlipidemia   . Hypertension   . 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 S/P CABG (coronary artery bypass graft) 1992  . ICD (implantable cardiac defibrillator) in place 03/2007    July, 2008  . Chronotropic incompetence     treated w/pacemaker, ICD 7.2008. This was placed after CPX done post 2008 cath. CPX showed only chronotropic incompetence  . Arrhythmia   . CHF (congestive heart failure)   . Syncopal episodes     relative hypotension  . Mitral regurgitation     mild  . Numbness and tingling of left arm and leg     improved after tx w/prednisone  . Hx of CABG     1992  . Grief reaction     Patient was a son in weight in 2001  . Arthritis   . Blood transfusion 1969  . Myocardial infarction   . Ejection fraction     .    Past Surgical History  Procedure Laterality Date  . Coronary artery bypass graft  1992  . Cardiac catheterization    . Cardiac defibrillator placement  2008  . Leg surgery  1942    Fx leg repair with artificial bone  . Cataract extraction  12/14 & 1/15    right eye in December and Left eye in January    Current Outpatient  Prescriptions  Medication Sig Dispense Refill  . aspirin 81 MG tablet Take 1 tablet (81 mg total) by mouth daily.  30 tablet  11  . atorvastatin (LIPITOR) 40 MG tablet TAKE 1 TABLET DAILY  90 tablet  1  . carvedilol (COREG) 25 MG tablet Take 1 tablet (25 mg total) by mouth 2 (two) times daily.  180 tablet  3  . Cholecalciferol 1000 UNITS tablet Take 1,000 Units by mouth daily.        Marland Kitchen LANOXIN 125 MCG tablet TAKE 1 TABLET DAILY  90 tablet  1  . nitroGLYCERIN (NITROLINGUAL) 0.4 MG/SPRAY spray Place 1 spray under the tongue as needed.        . ramipril (ALTACE) 2.5 MG capsule TAKE 1 CAPSULE DAILY  90 capsule  1   No current facility-administered medications for this visit.    Allergies  Allergen Reactions  . Morphine     "increased white corpuscles"  . Ramipril Other (See Comments)    REACTION: cough if dose is over 2.5mg     Review of Systems negative except from HPI and PMH  Physical Exam BP 167/91  Pulse 70  Ht 5\' 7"  (1.702 m)  Wt 160 lb (72.576 kg)  BMI 25.05  kg/m2 Well developed and well nourished in no acute distress HENT normal E scleral and icterus clear Neck Supple JVP flat; carotids brisk and full Clear to ausculation  *Regular rate and rhythm, no murmurs gallops or rub Soft with active bowel sounds No clubbing cyanosis  Edema Alert and oriented, grossly normal motor and sensory function Skin Warm and Dry    Assessment and  Plan  Ischemic cardiomyopathy  Hypertension  Implantable  St Jude The patient's device was interrogated.  The information was reviewed. No changes were made in the programming.     His device is approaching ERI. We will plan mostly telemetry checks. His blood pressure is modestly elevated today but this seems to be unusual.  We'll check a digoxin level his potassium level

## 2014-02-14 NOTE — Patient Instructions (Addendum)
We will monitor your battery with monthly checks  Labs today:  BMET, Digoxin level

## 2014-02-15 LAB — DIGOXIN LEVEL: DIGOXIN LVL: 1.1 ng/mL (ref 0.8–2.0)

## 2014-02-21 ENCOUNTER — Other Ambulatory Visit: Payer: Self-pay | Admitting: *Deleted

## 2014-02-21 MED ORDER — DIGOXIN 125 MCG PO TABS: 0.0625 mg | ORAL_TABLET | Freq: Every day | ORAL | Status: DC

## 2014-03-16 ENCOUNTER — Ambulatory Visit (INDEPENDENT_AMBULATORY_CARE_PROVIDER_SITE_OTHER): Payer: Medicare Other | Admitting: *Deleted

## 2014-03-16 DIAGNOSIS — Z95810 Presence of automatic (implantable) cardiac defibrillator: Secondary | ICD-10-CM

## 2014-03-16 NOTE — Progress Notes (Signed)
Remote ICD transmission.   

## 2014-03-27 LAB — MDC_IDC_ENUM_SESS_TYPE_REMOTE
Battery Remaining Longevity: 4 mo
Battery Voltage: 2.5 V
Brady Statistic AP VP Percent: 1 %
Brady Statistic AS VP Percent: 1 %
Brady Statistic AS VS Percent: 2.8 %
Date Time Interrogation Session: 20150702061355
HIGH POWER IMPEDANCE MEASURED VALUE: 65 Ohm
HighPow Impedance: 65 Ohm
Lead Channel Impedance Value: 430 Ohm
Lead Channel Pacing Threshold Amplitude: 0.75 V
Lead Channel Pacing Threshold Amplitude: 0.75 V
Lead Channel Pacing Threshold Pulse Width: 0.4 ms
Lead Channel Sensing Intrinsic Amplitude: 11.6 mV
Lead Channel Setting Pacing Amplitude: 2.5 V
Lead Channel Setting Pacing Pulse Width: 0.4 ms
MDC IDC MSMT LEADCHNL RA SENSING INTR AMPL: 2.1 mV
MDC IDC MSMT LEADCHNL RV IMPEDANCE VALUE: 400 Ohm
MDC IDC MSMT LEADCHNL RV PACING THRESHOLD PULSEWIDTH: 0.4 ms
MDC IDC PG SERIAL: 449147
MDC IDC SET LEADCHNL RA PACING AMPLITUDE: 2 V
MDC IDC SET LEADCHNL RV SENSING SENSITIVITY: 0.3 mV
MDC IDC SET ZONE DETECTION INTERVAL: 250 ms
MDC IDC STAT BRADY AP VS PERCENT: 96 %
MDC IDC STAT BRADY RA PERCENT PACED: 95 %
MDC IDC STAT BRADY RV PERCENT PACED: 1 %
Zone Setting Detection Interval: 300 ms
Zone Setting Detection Interval: 340 ms

## 2014-04-11 ENCOUNTER — Other Ambulatory Visit: Payer: Self-pay | Admitting: Cardiology

## 2014-04-18 ENCOUNTER — Ambulatory Visit (INDEPENDENT_AMBULATORY_CARE_PROVIDER_SITE_OTHER): Payer: Medicare Other | Admitting: Internal Medicine

## 2014-04-18 ENCOUNTER — Encounter: Payer: Self-pay | Admitting: Internal Medicine

## 2014-04-18 ENCOUNTER — Telehealth: Payer: Self-pay | Admitting: Cardiology

## 2014-04-18 ENCOUNTER — Ambulatory Visit (INDEPENDENT_AMBULATORY_CARE_PROVIDER_SITE_OTHER): Payer: Medicare Other | Admitting: *Deleted

## 2014-04-18 VITALS — BP 120/70 | HR 70 | Temp 97.2°F | Resp 18 | Ht 67.0 in | Wt 173.0 lb

## 2014-04-18 DIAGNOSIS — Z0000 Encounter for general adult medical examination without abnormal findings: Secondary | ICD-10-CM

## 2014-04-18 DIAGNOSIS — Z23 Encounter for immunization: Secondary | ICD-10-CM

## 2014-04-18 DIAGNOSIS — Z95810 Presence of automatic (implantable) cardiac defibrillator: Secondary | ICD-10-CM

## 2014-04-18 NOTE — Progress Notes (Signed)
Remote ICD transmission.   

## 2014-04-18 NOTE — Patient Instructions (Addendum)
An After Visit Summary was printed and given to the patient.  Injection site may become painful.  Apply warm compresses as needed.  May take tylenol if needed for pain.  Call clinic with questions or concerns.  Keep follow up appointment with Dr. Alain Marion as scheduled.

## 2014-04-18 NOTE — Progress Notes (Signed)
Subjective:    [name] Och. is a 80 y.o. male who presents for Medicare Annual/Subsequent preventive examination.   Preventive Screening-Counseling & Management  Tobacco History  Smoking status  . Former Smoker  . Types: Cigars  Smokeless tobacco  . Never Used    Comment: quit 1973    Problems Prior to Visit 1.   Current Problems (verified) Patient Active Problem List   Diagnosis Date Noted  . Chronic systolic CHF (congestive heart failure) 11/18/2013  . Ejection fraction   . CAD (coronary artery disease)   . Well adult exam 07/13/2012  . Chronic renal insufficiency, stage III (moderate) 07/21/2011  . Ischemic cardiomyopathy   . Hx of CABG   . Chronotropic incompetence   . Syncopal episodes   . Mitral regurgitation   . Numbness and tingling of left arm and leg   . PARESTHESIA 10/22/2009  . TOBACCO USE, QUIT 10/22/2009  . SEBACEOUS CYST 08/27/2009  . Anemia of other chronic disease 02/18/2008  . HYPERLIPIDEMIA 05/03/2007  . HYPERTENSION 05/03/2007  . COLONIC POLYPS, HX OF 05/03/2007  . Automatic implantable cardioverter-defibrillator in situ 03/16/2007    Medications Prior to Visit Current Outpatient Prescriptions on File Prior to Visit  Medication Sig Dispense Refill  . aspirin 81 MG tablet Take 1 tablet (81 mg total) by mouth daily.  30 tablet  11  . atorvastatin (LIPITOR) 40 MG tablet TAKE 1 TABLET DAILY  90 tablet  1  . carvedilol (COREG) 25 MG tablet TAKE 1 TABLET TWICE A DAY  180 tablet  0  . Cholecalciferol 1000 UNITS tablet Take 1,000 Units by mouth daily.        . digoxin (LANOXIN) 0.125 MG tablet Take 0.5 tablets (0.0625 mg total) by mouth daily.  90 tablet  1  . nitroGLYCERIN (NITROLINGUAL) 0.4 MG/SPRAY spray Place 1 spray under the tongue as needed.        . ramipril (ALTACE) 2.5 MG capsule TAKE 1 CAPSULE DAILY  90 capsule  1   No current facility-administered medications on file prior to visit.    Current Medications (verified) Current  Outpatient Prescriptions  Medication Sig Dispense Refill  . aspirin 81 MG tablet Take 1 tablet (81 mg total) by mouth daily.  30 tablet  11  . atorvastatin (LIPITOR) 40 MG tablet TAKE 1 TABLET DAILY  90 tablet  1  . carvedilol (COREG) 25 MG tablet TAKE 1 TABLET TWICE A DAY  180 tablet  0  . Cholecalciferol 1000 UNITS tablet Take 1,000 Units by mouth daily.        . digoxin (LANOXIN) 0.125 MG tablet Take 0.5 tablets (0.0625 mg total) by mouth daily.  90 tablet  1  . nitroGLYCERIN (NITROLINGUAL) 0.4 MG/SPRAY spray Place 1 spray under the tongue as needed.        . ramipril (ALTACE) 2.5 MG capsule TAKE 1 CAPSULE DAILY  90 capsule  1   No current facility-administered medications for this visit.     Allergies (verified) Morphine and Ramipril   PAST HISTORY  Family History Family History  Problem Relation Age of Onset  . Hypertension Other   . Colon cancer Other     1st degree relative <50  . Hypertension Mother   . Hypertension Father     Social History History  Substance Use Topics  . Smoking status: Former Smoker    Types: Cigars  . Smokeless tobacco: Never Used     Comment: quit 1973  . Alcohol Use: No  Are there smokers in your home (other than you)?  No   Risk Factors Current exercise habits: The patient has a physically strenuous job, but has no regular exercise apart from work.   Dietary issues discussed: good diet   Cardiac risk factors: advanced age (older than 53 for men, 53 for women), dyslipidemia, hypertension and male gender.  Depression Screen (Note: if answer to either of the following is "Yes", a more complete depression screening is indicated)   Q1: Over the past two weeks, have you felt down, depressed or hopeless? No  Q2: Over the past two weeks, have you felt little interest or pleasure in doing things? No  Have you lost interest or pleasure in daily life? No  Do you often feel hopeless? No  Do you cry easily over simple problems?  No  Activities of Daily Living In your present state of health, do you have any difficulty performing the following activities?:  Driving? No Managing money?  No Feeding yourself? No Getting from bed to chair? No Climbing a flight of stairs? No Preparing food and eating?: No Bathing or showering? No Getting dressed: No Getting to the toilet? No Using the toilet:No Moving around from place to place: No In the past year have you fallen or had a near fall?:No   Are you sexually active?  Yes  Do you have more than one partner?  No  Hearing Difficulties: No Do you often ask people to speak up or repeat themselves? No Do you experience ringing or noises in your ears? No Do you have difficulty understanding soft or whispered voices? No   Do you feel that you have a problem with memory? no  Do you often misplace items? no  Do you feel safe at home?  Yes  Cognitive Testing  Alert? Yes  Normal Appearance?Yes  Oriented to person? Yes  Place? Yes   Time? Yes  Recall of three objects?  Yes  Can perform simple calculations? Yes  Displays appropriate judgment?Yes  Can read the correct time from a watch face?Yes   Advanced Directives have been discussed with the patient? Yes   List the Names of Other Physician/Practitioners you currently use: 1.  Dr Ron [name] cardiology Dr Lequita Halt  EP specialist    Indicate any recent Medical Services you may have received from other than [facility] providers in the past year (date may be approximate).  Immunization History  Administered Date(s) Administered  . Influenza Split 07/13/2012  . Influenza-Unspecified 06/15/2013  . Pneumococcal Polysaccharide-23 09/15/2005  . Tetanus 04/18/2014    Screening Tests Health Maintenance  Topic Date Due  . Tetanus/tdap  09/07/1950  . Zostavax  09/08/1991  . Influenza Vaccine  05/02/2014 (Originally 04/15/2014)  . Colonoscopy  12/01/2021  . Pneumococcal Polysaccharide Vaccine Age 12 And Over  Completed    All  answers were reviewed with the patient and necessary referrals were made:  Georgetta Haber, MD   04/18/2014   History reviewed: allergies, current medications, past family history, past medical history, past social history, past surgical history and problem list  Review of Systems Pertinent items are noted in HPI.    Objective:     Vision by Snellen chart: right eye:20/20, left eye:20/20  Reading glasses Blood pressure 120/70, pulse 70, temperature 97.2 F (36.2 C), temperature source Oral, resp. rate 18, height 5\' 7"  (1.702 m), weight 173 lb (78.472 kg), SpO2 98.00%. Body mass index is 27.09 kg/(m^2).  No exam performed today, medicare wellness visit only.  Assessment:     Patient presents for yearly preventative medicine examination. Medicare questionnaire was completed  All immunizations and health maintenance protocols were reviewed with the patient and needed orders were placed.  Appropriate screening laboratory values were ordered for the patient including screening of hyperlipidemia, renal function and hepatic function. If indicated by BPH, a PSA was ordered.  Medication reconciliation,  past medical history, social history, problem list and allergies were reviewed in detail with the patient  Goals were established with regard to weight loss, exercise, and  diet in compliance with medications  End of life planning was discussed.       Plan:     During the course of the visit the patient was educated and counseled about appropriate screening and preventive services including:    Influenza vaccine  Td vaccine  Pt to schedule flu shot the Td was given  Follow up with Dr Oletha Blend  Diet review for nutrition referral? Yes ____  Not Indicated ___x_   Patient Instructions (the written plan) was given to the patient.  Medicare Attestation I have personally reviewed: The patient's medical and social history Their use of alcohol, tobacco or illicit  drugs Their current medications and supplements The patient's functional ability including ADLs,fall risks, home safety risks, cognitive, and hearing and visual impairment Diet and physical activities Evidence for depression or mood disorders  The patient's weight, height, BMI, and visual acuity have been recorded in the chart.  I have made referrals, counseling, and provided education to the patient based on review of the above and I have provided the patient with a written personalized care plan for preventive services.     Georgetta Haber, MD   04/18/2014

## 2014-04-18 NOTE — Telephone Encounter (Signed)
Spoke with pt and reminded pt of remote transmission that is due today. Pt verbalized understanding.   

## 2014-04-20 ENCOUNTER — Telehealth: Payer: Self-pay | Admitting: Internal Medicine

## 2014-04-20 NOTE — Telephone Encounter (Signed)
New message     Returning [name] [name]'s call from yesterday

## 2014-04-20 NOTE — Telephone Encounter (Signed)
Patient's device reached ERI. Informed patient that device may vibrate (warning ERI), and that we can turn this off in device clinic. He has not felt it yet. Scheduling device change for 8/19 per pt request and will call him back with detail this afternoon, per his request.

## 2014-04-25 ENCOUNTER — Encounter: Payer: Self-pay | Admitting: Internal Medicine

## 2014-05-02 NOTE — Telephone Encounter (Signed)
Late Entry from 8/6: Patient not at [facility] yet. Called and informed patient that we will call to schedule procedure once he has reached ERI, but that we cannot schedule before this. He verbalized understanding.

## 2014-05-03 ENCOUNTER — Ambulatory Visit (HOSPITAL_COMMUNITY): Admission: RE | Admit: 2014-05-03 | Payer: Medicare Other | Source: Ambulatory Visit | Admitting: Internal Medicine

## 2014-05-03 ENCOUNTER — Encounter (HOSPITAL_COMMUNITY): Admission: RE | Payer: Self-pay | Source: Ambulatory Visit

## 2014-05-03 SURGERY — ICD GENERATOR CHANGE
Anesthesia: LOCAL | Laterality: Left

## 2014-05-11 ENCOUNTER — Ambulatory Visit (INDEPENDENT_AMBULATORY_CARE_PROVIDER_SITE_OTHER): Payer: Medicare Other | Admitting: Internal Medicine

## 2014-05-11 ENCOUNTER — Encounter: Payer: Self-pay | Admitting: Internal Medicine

## 2014-05-11 VITALS — BP 148/76 | HR 70 | Ht 67.0 in | Wt 171.0 lb

## 2014-05-11 DIAGNOSIS — Z01812 Encounter for preprocedural laboratory examination: Secondary | ICD-10-CM

## 2014-05-11 DIAGNOSIS — Z95810 Presence of automatic (implantable) cardiac defibrillator: Secondary | ICD-10-CM

## 2014-05-11 DIAGNOSIS — I255 Ischemic cardiomyopathy: Secondary | ICD-10-CM

## 2014-05-11 DIAGNOSIS — I2589 Other forms of chronic ischemic heart disease: Secondary | ICD-10-CM

## 2014-05-11 LAB — MDC_IDC_ENUM_SESS_TYPE_INCLINIC
Implantable Pulse Generator Serial Number: 449147
Lead Channel Setting Pacing Amplitude: 2 V
Lead Channel Setting Pacing Amplitude: 2.5 V
Lead Channel Setting Pacing Pulse Width: 0.4 ms
Lead Channel Setting Sensing Sensitivity: 0.3 mV
MDC IDC SET ZONE DETECTION INTERVAL: 250 ms
Zone Setting Detection Interval: 300 ms
Zone Setting Detection Interval: 340 ms

## 2014-05-11 NOTE — Patient Instructions (Signed)
Your physician recommends that you continue on your current medications as directed. Please refer to the Current Medication list given to you today.  Your physician has recommended that you have a defibrillator generator change.    Sherri, RN will call you to arrange this procedure.

## 2014-05-11 NOTE — Progress Notes (Signed)
Patient Care Team: Cassandria Anger, MD as PCP - General Carlena Bjornstad, MD (Cardiology) Deboraha Sprang, MD (Cardiology)   HPI  [name] [name]. is a 80 y.o. male seen in followup for an ICD implanted for primary prevention in the setting of ischemic heart disease.    patient denies chest pain, shortness of breath, nocturnal dyspnea, orthopnea or peripheral edema. There have been no palpitations, lightheadedness or syncope.  He recently went to see his son who lives in [place].  He has recently got married and now lives on her horse farm in [place]  His device has reached ERI.   Past Medical History  Diagnosis Date  . History of colon polyps   . Hyperlipidemia   . Hypertension   . 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 S/P CABG (coronary artery bypass graft) 1992  . ICD (implantable cardiac defibrillator) in place 03/2007    July, 2008  . Chronotropic incompetence     treated w/pacemaker, ICD 7.2008. This was placed after CPX done post 2008 cath. CPX showed only chronotropic incompetence  . Arrhythmia   . CHF (congestive heart failure)   . Syncopal episodes     relative hypotension  . Mitral regurgitation     mild  . Numbness and tingling of left arm and leg     improved after tx w/prednisone  . Hx of CABG     1992  . Grief reaction     Patient was a son in weight in 2001  . Arthritis   . Blood transfusion 1969  . Myocardial infarction   . Ejection fraction     .    Past Surgical History  Procedure Laterality Date  . Coronary artery bypass graft  1992  . Cardiac catheterization    . Cardiac defibrillator placement  2008  . Leg surgery  1942    Fx leg repair with artificial bone  . Cataract extraction  12/14 & 1/15    right eye in December and Left eye in  January    Current Outpatient Prescriptions  Medication Sig Dispense Refill  . aspirin 81 MG tablet Take 1 tablet (81 mg total) by mouth daily.  30 tablet  11  . atorvastatin (LIPITOR) 40 MG tablet TAKE 1 TABLET DAILY  90 tablet  1  . carvedilol (COREG) 25 MG tablet TAKE 1 TABLET TWICE A DAY  180 tablet  0  . Cholecalciferol 1000 UNITS tablet Take 1,000 Units by mouth daily.        . digoxin (LANOXIN) 0.125 MG tablet Take 0.5 tablets (0.0625 mg total) by mouth daily.  90 tablet  1  . nitroGLYCERIN (NITROLINGUAL) 0.4 MG/SPRAY spray Place 1 spray under the tongue as needed.        . ramipril (ALTACE) 2.5 MG capsule TAKE 1 CAPSULE DAILY  90 capsule  1   No current facility-administered medications for this visit.    Allergies  Allergen Reactions  . Morphine     "increased white corpuscles"  . Ramipril Other (See Comments)    REACTION: cough if dose is over 2.5mg     Review of Systems negative except from HPI and PMH  Physical Exam BP 148/76  Pulse 70  Ht 5\' 7"  (1.702 m)  Wt 171  lb (77.565 kg)  BMI 26.78 kg/m2 Well developed and well nourished in no acute distress HENT normal E scleral and icterus clear Neck Supple JVP flat; carotids brisk and full Clear to ausculation Device pocket well healed; without hematoma or erythema.  There is no tethering   *Regular rate and rhythm, no murmurs gallops or rub Soft with active bowel sounds No clubbing cyanosis  Edema Alert and oriented, grossly normal motor and sensory function Skin Warm and Dry    Assessment and  Plan  Ischemic cardiomyopathy  Sinus Node dysfunction  Hypertension  Implantable Defibrillator  St Jude   The patient's device was interrogated.  The information was reviewed. No changes were made in the programming. His device has reached ERI  We have discussed ICD vs Pacer replacement; he need one as has significant sinus node dysfunction  He and his wife are in agreement to pursue ICD implanattion  We have  reviewed the benefits and risks of generator replacement.  These include but are not limited to lead fracture and infection.  The patient understands, agrees and is willing to proceed.

## 2014-05-12 ENCOUNTER — Encounter: Payer: Self-pay | Admitting: Internal Medicine

## 2014-05-16 ENCOUNTER — Telehealth: Payer: Self-pay | Admitting: Internal Medicine

## 2014-05-16 NOTE — Telephone Encounter (Signed)
New problem   Pt stated he was waiting on a call from Sherri to set up an appt for his pacermaker change. Please call pt.

## 2014-05-16 NOTE — Telephone Encounter (Signed)
Follow up      Pt want to talk to [facility] regarding pacemaker change out

## 2014-05-17 ENCOUNTER — Encounter: Payer: Self-pay | Admitting: Cardiology

## 2014-05-17 ENCOUNTER — Ambulatory Visit (INDEPENDENT_AMBULATORY_CARE_PROVIDER_SITE_OTHER): Payer: Medicare Other | Admitting: Cardiology

## 2014-05-17 VITALS — BP 130/64 | HR 70 | Ht 67.0 in | Wt 171.8 lb

## 2014-05-17 DIAGNOSIS — I5022 Chronic systolic (congestive) heart failure: Secondary | ICD-10-CM

## 2014-05-17 DIAGNOSIS — I2589 Other forms of chronic ischemic heart disease: Secondary | ICD-10-CM

## 2014-05-17 DIAGNOSIS — I509 Heart failure, unspecified: Secondary | ICD-10-CM

## 2014-05-17 DIAGNOSIS — I255 Ischemic cardiomyopathy: Secondary | ICD-10-CM

## 2014-05-17 DIAGNOSIS — I25810 Atherosclerosis of coronary artery bypass graft(s) without angina pectoris: Secondary | ICD-10-CM

## 2014-05-17 DIAGNOSIS — Z95810 Presence of automatic (implantable) cardiac defibrillator: Secondary | ICD-10-CM

## 2014-05-17 NOTE — Assessment & Plan Note (Signed)
Volume status is stable. No change in therapy. 

## 2014-05-17 NOTE — Progress Notes (Signed)
Patient ID: [name] [name]., male   DOB: 03-18-1929, 80 y.o.   MRN: [id number]    HPI  Patient is seen today to followup his coronary disease. He is stable. Collect her physiology is in the process of scheduling his ICD replacement soon. He's not having any significant chest pain or shortness of breath.  Allergies  Allergen Reactions  . Morphine     "increased white corpuscles"  . Ramipril Other (See Comments)    REACTION: cough if dose is over 2.5mg     Current Outpatient Prescriptions  Medication Sig Dispense Refill  . aspirin 81 MG tablet Take 1 tablet (81 mg total) by mouth daily.  30 tablet  11  . atorvastatin (LIPITOR) 40 MG tablet TAKE 1 TABLET DAILY  90 tablet  1  . carvedilol (COREG) 25 MG tablet TAKE 1 TABLET TWICE A DAY  180 tablet  0  . Cholecalciferol 1000 UNITS tablet Take 1,000 Units by mouth daily.        . digoxin (LANOXIN) 0.125 MG tablet Take 0.5 tablets (0.0625 mg total) by mouth daily.  90 tablet  1  . nitroGLYCERIN (NITROLINGUAL) 0.4 MG/SPRAY spray Place 1 spray under the tongue as needed.        . ramipril (ALTACE) 2.5 MG capsule TAKE 1 CAPSULE DAILY  90 capsule  1   No current facility-administered medications for this visit.    History   Social History  . Marital Status: Married    Spouse Name: N/A    Number of Children: N/A  . Years of Education: N/A   Occupational History  . Retired    Social History Main Topics  . Smoking status: Former Smoker    Types: Cigars  . Smokeless tobacco: Never Used     Comment: quit 1973  . Alcohol Use: No  . Drug Use: No  . Sexual Activity: Not on file   Other Topics Concern  . Not on file   Social History Narrative   Widowed - 06/2010    Family History  Problem Relation Age of Onset  . Hypertension Other   . Colon cancer Other     1st degree relative <50  . Hypertension Mother   . Hypertension Father     Past Medical History  Diagnosis Date  . History of colon polyps   . Hyperlipidemia   .  Hypertension   . 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 S/P CABG (coronary artery bypass graft) 1992  . ICD (implantable cardiac defibrillator) in place 03/2007    July, 2008  . Chronotropic incompetence     treated w/pacemaker, ICD 7.2008. This was placed after CPX done post 2008 cath. CPX showed only chronotropic incompetence  . Arrhythmia   . CHF (congestive heart failure)   . Syncopal episodes     relative hypotension  . Mitral regurgitation     mild  . Numbness and tingling of left arm and leg     improved after tx w/prednisone  . Hx of CABG     1992  . Grief reaction     Patient was a son in weight in 2001  . Arthritis   . Blood transfusion 1969  . Myocardial infarction   . Ejection fraction     .    Past Surgical  History  Procedure Laterality Date  . Coronary artery bypass graft  1992  . Cardiac catheterization    . Cardiac defibrillator placement  2008  . Leg surgery  1942    Fx leg repair with artificial bone  . Cataract extraction  12/14 & 1/15    right eye in December and Left eye in January    Patient Active Problem List   Diagnosis Date Noted  . Hx of CABG     Priority: High  . Chronotropic incompetence     Priority: High  . Automatic implantable cardioverter-defibrillator in situ 03/16/2007    Priority: High  . Chronic systolic CHF (congestive heart failure) 11/18/2013  . Ejection fraction   . CAD (coronary artery disease)   . Well adult exam 07/13/2012  . Chronic renal insufficiency, stage III (moderate) 07/21/2011  . Ischemic cardiomyopathy   . Syncopal episodes   . Mitral regurgitation   . Numbness and tingling of left arm and leg   . PARESTHESIA 10/22/2009  . TOBACCO USE, QUIT 10/22/2009  . SEBACEOUS CYST 08/27/2009  . Anemia of other chronic disease 02/18/2008    . HYPERLIPIDEMIA 05/03/2007  . HYPERTENSION 05/03/2007  . COLONIC POLYPS, HX OF 05/03/2007    ROS   Patient denies fever, chills, headache, sweats, rash, change in vision, change in hearing, chest pain, cough, nausea or vomiting, urinary symptoms. All other systems are reviewed and are negative.  PHYSICAL EXAM  Patient is oriented to person time and place. Affect is normal. He is here with his wife. Head is atraumatic. Sclera and conjunctiva are normal. Lungs are clear. Respiratory effort is not labored. There is no jugulovenous distention. Cardiac exam reveals S1 and S2. Abdomen is soft. It is no peripheral edema.  Filed Vitals:   05/17/14 1114  BP: 130/64  Pulse: 70  Height: 5\' 7"  (1.702 m)  Weight: 171 lb 12.8 oz (77.928 kg)  SpO2: 97%     ASSESSMENT & PLAN

## 2014-05-17 NOTE — Assessment & Plan Note (Signed)
Patient is on appropriate medications. Spironolactone as not being used because he had some changes in his renal function was tried. No further workup

## 2014-05-17 NOTE — Patient Instructions (Signed)
Your physician recommends that you continue on your current medications as directed. Please refer to the Current Medication list given to you today.  Your physician wants you to follow-up in: 6 months. You will receive a reminder letter in the mail two months in advance. If you don't receive a letter, please call our office to schedule the follow-up appointment.  

## 2014-05-17 NOTE — Assessment & Plan Note (Signed)
His ICD will be replaced soon by Dr. Caryl Comes. Scheduling is to be finalize by the electrophysiology team by the end of the week.

## 2014-05-17 NOTE — Assessment & Plan Note (Signed)
Coronary disease is stable. His last stress test was 2010. He is active with no significant symptoms. No further workup.

## 2014-05-19 NOTE — Telephone Encounter (Signed)
F/u ° ° °Pt calling concerning previous message. Please call pt.  °

## 2014-05-19 NOTE — Telephone Encounter (Signed)
Scheduled ICD generator change for 10/7. Pt will be out of town through 9/14, so we agreed to review instructions when he returns.

## 2014-05-23 ENCOUNTER — Encounter: Payer: Self-pay | Admitting: Internal Medicine

## 2014-05-29 ENCOUNTER — Other Ambulatory Visit: Payer: Self-pay | Admitting: Cardiology

## 2014-05-30 ENCOUNTER — Encounter: Payer: Self-pay | Admitting: *Deleted

## 2014-05-30 NOTE — Telephone Encounter (Signed)
Scheduled 06/21/14. Pre procedure labs scheduled for 06/14/14. Wound check follow up on 07/03/14.  Reviewed letter of instructions with patient and mailed to home address per request. Patient verbalized understanding and agreeable to plan.

## 2014-06-14 ENCOUNTER — Other Ambulatory Visit (INDEPENDENT_AMBULATORY_CARE_PROVIDER_SITE_OTHER): Payer: Medicare Other

## 2014-06-14 DIAGNOSIS — Z01812 Encounter for preprocedural laboratory examination: Secondary | ICD-10-CM

## 2014-06-14 DIAGNOSIS — Z95810 Presence of automatic (implantable) cardiac defibrillator: Secondary | ICD-10-CM

## 2014-06-14 DIAGNOSIS — I255 Ischemic cardiomyopathy: Secondary | ICD-10-CM

## 2014-06-14 DIAGNOSIS — I2589 Other forms of chronic ischemic heart disease: Secondary | ICD-10-CM

## 2014-06-14 LAB — CBC WITH DIFFERENTIAL/PLATELET
Basophils Absolute: 0.1 10*3/uL (ref 0.0–0.1)
Basophils Relative: 0.7 % (ref 0.0–3.0)
EOS PCT: 3.1 % (ref 0.0–5.0)
Eosinophils Absolute: 0.3 10*3/uL (ref 0.0–0.7)
HEMATOCRIT: 39 % (ref 39.0–52.0)
Hemoglobin: 13.1 g/dL (ref 13.0–17.0)
LYMPHS ABS: 1 10*3/uL (ref 0.7–4.0)
Lymphocytes Relative: 12.5 % (ref 12.0–46.0)
MCHC: 33.6 g/dL (ref 30.0–36.0)
MCV: 85.9 fl (ref 78.0–100.0)
MONO ABS: 0.7 10*3/uL (ref 0.1–1.0)
Monocytes Relative: 7.9 % (ref 3.0–12.0)
Neutro Abs: 6.3 10*3/uL (ref 1.4–7.7)
Neutrophils Relative %: 75.8 % (ref 43.0–77.0)
Platelets: 194 10*3/uL (ref 150.0–400.0)
RBC: 4.54 Mil/uL (ref 4.22–5.81)
RDW: 15.3 % (ref 11.5–15.5)
WBC: 8.3 10*3/uL (ref 4.0–10.5)

## 2014-06-14 LAB — BASIC METABOLIC PANEL
BUN: 18 mg/dL (ref 6–23)
CHLORIDE: 107 meq/L (ref 96–112)
CO2: 25 mEq/L (ref 19–32)
Calcium: 9.1 mg/dL (ref 8.4–10.5)
Creatinine, Ser: 1.1 mg/dL (ref 0.4–1.5)
GFR: 65.91 mL/min (ref >60.00)
Glucose, Bld: 105 mg/dL — ABNORMAL HIGH (ref 70–99)
POTASSIUM: 4.2 meq/L (ref 3.5–5.1)
Sodium: 139 mEq/L (ref 135–145)

## 2014-06-15 ENCOUNTER — Encounter (HOSPITAL_COMMUNITY): Payer: Self-pay

## 2014-06-20 DIAGNOSIS — I509 Heart failure, unspecified: Secondary | ICD-10-CM | POA: Diagnosis not present

## 2014-06-20 DIAGNOSIS — T82110A Breakdown (mechanical) of cardiac electrode, initial encounter: Secondary | ICD-10-CM | POA: Diagnosis not present

## 2014-06-20 DIAGNOSIS — I10 Essential (primary) hypertension: Secondary | ICD-10-CM | POA: Diagnosis not present

## 2014-06-20 DIAGNOSIS — I252 Old myocardial infarction: Secondary | ICD-10-CM | POA: Diagnosis not present

## 2014-06-20 DIAGNOSIS — E785 Hyperlipidemia, unspecified: Secondary | ICD-10-CM | POA: Diagnosis not present

## 2014-06-20 DIAGNOSIS — Z4502 Encounter for adjustment and management of automatic implantable cardiac defibrillator: Secondary | ICD-10-CM | POA: Diagnosis present

## 2014-06-20 DIAGNOSIS — Z7982 Long term (current) use of aspirin: Secondary | ICD-10-CM | POA: Diagnosis not present

## 2014-06-20 DIAGNOSIS — Z79899 Other long term (current) drug therapy: Secondary | ICD-10-CM | POA: Diagnosis not present

## 2014-06-20 DIAGNOSIS — I2510 Atherosclerotic heart disease of native coronary artery without angina pectoris: Secondary | ICD-10-CM | POA: Diagnosis not present

## 2014-06-20 DIAGNOSIS — Z951 Presence of aortocoronary bypass graft: Secondary | ICD-10-CM | POA: Diagnosis not present

## 2014-06-20 DIAGNOSIS — I255 Ischemic cardiomyopathy: Secondary | ICD-10-CM | POA: Diagnosis not present

## 2014-06-20 DIAGNOSIS — I340 Nonrheumatic mitral (valve) insufficiency: Secondary | ICD-10-CM | POA: Diagnosis not present

## 2014-06-20 MED ORDER — SODIUM CHLORIDE 0.9 % IV SOLN
INTRAVENOUS | Status: DC
  Administered 2014-06-21: 10:00:00 via INTRAVENOUS

## 2014-06-20 MED ORDER — CEFAZOLIN SODIUM-DEXTROSE 2-3 GM-% IV SOLR: 2.0000 g | INTRAVENOUS | Status: AC

## 2014-06-20 MED ORDER — SODIUM CHLORIDE 0.9 % IR SOLN
80.0000 mg | Status: AC
  Filled 2014-06-20: qty 2

## 2014-06-20 MED ORDER — MUPIROCIN 2 % EX OINT
1.0000 "application " | TOPICAL_OINTMENT | Freq: Once | CUTANEOUS | Status: DC
  Filled 2014-06-20: qty 22

## 2014-06-20 MED ORDER — CHLORHEXIDINE GLUCONATE 4 % EX LIQD
60.0000 mL | Freq: Once | CUTANEOUS | Status: DC
  Filled 2014-06-20: qty 60

## 2014-06-21 ENCOUNTER — Encounter (HOSPITAL_COMMUNITY)
Admission: RE | Disposition: A | Discharge status: Home / Self Care | Payer: Self-pay | Source: Ambulatory Visit | Attending: Internal Medicine

## 2014-06-21 ENCOUNTER — Ambulatory Visit (HOSPITAL_COMMUNITY)
Admission: RE | Admit: 2014-06-21 | Discharge: 2014-06-21 | Disposition: A | Discharge status: Home / Self Care | Payer: Medicare Other | Source: Ambulatory Visit | Attending: Internal Medicine | Admitting: Internal Medicine

## 2014-06-21 DIAGNOSIS — T82110A Breakdown (mechanical) of cardiac electrode, initial encounter: Secondary | ICD-10-CM | POA: Insufficient documentation

## 2014-06-21 DIAGNOSIS — E785 Hyperlipidemia, unspecified: Secondary | ICD-10-CM | POA: Insufficient documentation

## 2014-06-21 DIAGNOSIS — I2510 Atherosclerotic heart disease of native coronary artery without angina pectoris: Secondary | ICD-10-CM | POA: Insufficient documentation

## 2014-06-21 DIAGNOSIS — Z79899 Other long term (current) drug therapy: Secondary | ICD-10-CM | POA: Insufficient documentation

## 2014-06-21 DIAGNOSIS — I10 Essential (primary) hypertension: Secondary | ICD-10-CM | POA: Insufficient documentation

## 2014-06-21 DIAGNOSIS — I255 Ischemic cardiomyopathy: Secondary | ICD-10-CM | POA: Diagnosis not present

## 2014-06-21 DIAGNOSIS — Z95810 Presence of automatic (implantable) cardiac defibrillator: Secondary | ICD-10-CM

## 2014-06-21 DIAGNOSIS — I509 Heart failure, unspecified: Secondary | ICD-10-CM | POA: Insufficient documentation

## 2014-06-21 DIAGNOSIS — Z4502 Encounter for adjustment and management of automatic implantable cardiac defibrillator: Secondary | ICD-10-CM | POA: Diagnosis not present

## 2014-06-21 DIAGNOSIS — Z7982 Long term (current) use of aspirin: Secondary | ICD-10-CM | POA: Insufficient documentation

## 2014-06-21 DIAGNOSIS — I252 Old myocardial infarction: Secondary | ICD-10-CM | POA: Insufficient documentation

## 2014-06-21 DIAGNOSIS — I340 Nonrheumatic mitral (valve) insufficiency: Secondary | ICD-10-CM | POA: Insufficient documentation

## 2014-06-21 DIAGNOSIS — Z01812 Encounter for preprocedural laboratory examination: Secondary | ICD-10-CM

## 2014-06-21 DIAGNOSIS — Z951 Presence of aortocoronary bypass graft: Secondary | ICD-10-CM | POA: Insufficient documentation

## 2014-06-21 DIAGNOSIS — T82190A Other mechanical complication of cardiac electrode, initial encounter: Secondary | ICD-10-CM

## 2014-06-21 HISTORY — PX: IMPLANTABLE CARDIOVERTER DEFIBRILLATOR (ICD) GENERATOR CHANGE: SHX5469

## 2014-06-21 LAB — SURGICAL PCR SCREEN
MRSA, PCR: NEGATIVE
STAPHYLOCOCCUS AUREUS: NEGATIVE

## 2014-06-21 SURGERY — ICD GENERATOR CHANGE
Anesthesia: LOCAL

## 2014-06-21 MED ORDER — ONDANSETRON HCL 4 MG/2ML IJ SOLN: 4.0000 mg | Freq: Four times a day (QID) | INTRAMUSCULAR | Status: DC | PRN

## 2014-06-21 MED ORDER — FENTANYL CITRATE 0.05 MG/ML IJ SOLN
INTRAMUSCULAR | Status: AC
  Filled 2014-06-21: qty 2

## 2014-06-21 MED ORDER — MUPIROCIN 2 % EX OINT
1.0000 "application " | TOPICAL_OINTMENT | Freq: Once | CUTANEOUS | Status: AC
  Administered 2014-06-21: 1 via TOPICAL

## 2014-06-21 MED ORDER — ACETAMINOPHEN 325 MG PO TABS
325.0000 mg | ORAL_TABLET | ORAL | Status: DC | PRN
  Filled 2014-06-21: qty 2

## 2014-06-21 MED ORDER — LIDOCAINE HCL (PF) 1 % IJ SOLN
INTRAMUSCULAR | Status: AC
  Filled 2014-06-21: qty 60

## 2014-06-21 MED ORDER — MIDAZOLAM HCL 5 MG/5ML IJ SOLN
INTRAMUSCULAR | Status: AC
  Filled 2014-06-21: qty 5

## 2014-06-21 MED ORDER — SODIUM CHLORIDE 0.9 % IV SOLN: INTRAVENOUS | Status: DC

## 2014-06-21 MED ORDER — MUPIROCIN 2 % EX OINT
TOPICAL_OINTMENT | CUTANEOUS | Status: AC
  Administered 2014-06-21: 1 via TOPICAL
  Filled 2014-06-21: qty 22

## 2014-06-21 MED ORDER — CEFAZOLIN SODIUM-DEXTROSE 2-3 GM-% IV SOLR
INTRAVENOUS | Status: AC
  Filled 2014-06-21: qty 50

## 2014-06-21 NOTE — H&P (Signed)
Patient Care Team: Cassandria Anger, MD as PCP - General Carlena Bjornstad, MD (Cardiology) Deboraha Sprang, MD (Cardiology)   HPI  [name] Hurn. is a 80 y.o. male Admitted for ICD generator replacement     He had a dual chamber ICD implanted 2008 for primary prevention in the setting of ischemic heart disease.  Currently the patient denies chest pain, shortness of breath, nocturnal dyspnea, orthopnea or peripheral edema. There have been no palpitations, lightheadedness or syncope.   At the preop eval we discussed ICD vs PM generator replacement and he and his wife both preferred to pursue the former  Echo 20-14   EF 35-40%   Past Medical History  Diagnosis Date  . History of colon polyps   . Hyperlipidemia   . Hypertension   . 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 S/P CABG (coronary artery bypass graft) 1992  . ICD (implantable cardiac defibrillator) in place 03/2007    July, 2008  . Chronotropic incompetence     treated w/pacemaker, ICD 7.2008. This was placed after CPX done post 2008 cath. CPX showed only chronotropic incompetence  . Arrhythmia   . CHF (congestive heart failure)   . Syncopal episodes     relative hypotension  . Mitral regurgitation     mild  . Numbness and tingling of left arm and leg     improved after tx w/prednisone  . Hx of CABG     1992  . Grief reaction     Patient was a son in weight in 2001  . Arthritis   . Blood transfusion 1969  . Myocardial infarction   . Ejection fraction     .    Past Surgical History  Procedure Laterality Date  . Coronary artery bypass graft  1992  . Cardiac catheterization    . Cardiac defibrillator placement  2008  . Leg surgery  1942    Fx leg repair with artificial bone  . Cataract extraction  12/14 & 1/15   right eye in December and Left eye in January      Medication List    ASK your doctor about these medications       aspirin 81 MG tablet  Take 1 tablet (81 mg total) by mouth daily.     atorvastatin 40 MG tablet  Commonly known as:  LIPITOR  Take 40 mg by mouth daily.     carvedilol 25 MG tablet  Commonly known as:  COREG  Take 25 mg by mouth 2 (two) times daily with a meal.     Cholecalciferol 1000 UNITS tablet  Take 1,000 Units by mouth daily.     digoxin 0.125 MG tablet  Commonly known as:  LANOXIN  Take 0.5 tablets (0.0625 mg total) by mouth daily.     nitroGLYCERIN 0.4 MG/SPRAY spray  Commonly known as:  NITROLINGUAL  Place 1 spray under the tongue every 5 (five) minutes x 3 doses as needed for chest pain.     PRESERVISION AREDS 2 Caps  Take 1 capsule by mouth 2 (two) times daily.     ramipril 2.5 MG capsule  Commonly known as:  ALTACE  Take 2.5 mg by mouth daily.         Allergies  Allergen  Reactions  . Morphine     "increased white corpuscles"  . Ramipril Other (See Comments)    REACTION: cough if dose is over 2.5mg     Review of Systems negative except from HPI and PMH  Physical Exam There were no vitals taken for this visit. Well developed and well nourished in no acute distress HENT normal E scleral and icterus clear Neck Supple JVP flat; carotids brisk and full Clear to ausculation  Regular rate and rhythm, no murmurs gallops or rub Soft with active bowel sounds No clubbing cyanosis none Edema Alert and oriented, grossly normal motor and sensory function Skin Warm and Dry    Assessment and  Plan Ischemic cardiomyopathy  Sinus node dysfunction  Implantable defibrillator at ERI  We have reviewed the benefits and risks of generator replacement.  These include but are not limited to lead fracture and infection.  The patient understands, agrees and is willing to proceed.

## 2014-06-21 NOTE — Interval H&P Note (Signed)
ICD Criteria  Current LVEF:35% ;Obtained > 6 months ago.   NYHA Functional Classification: Class II  Heart Failure History:  Yes, Duration of heart failure since onset is > 9 months  Non-Ischemic Dilated Cardiomyopathy History:  No.  Atrial Fibrillation/Atrial Flutter:  No.  Ventricular Tachycardia History:  No.  Cardiac Arrest History:  No  History of Syndromes with Risk of Sudden Death:  No.  Previous ICD:  No.  Electrophysiology Study: No.  Prior MI: Yes, Most recent MI timeframe is > 40 days.  PPM: No.  OSA:  No  Patient Life Expectancy of >=1 year: Yes.  Anticoagulation Therapy:  Patient is NOT on anticoagulation therapy.   Beta Blocker Therapy:  Yes.   Ace Inhibitor/ARB Therapy:  Yes.History and Physical Interval Note:  06/21/2014 9:59 AM  [name] [name].  has presented today for surgery, with the diagnosis of battery depletion, ERI  The various methods of treatment have been discussed with the patient and family. After consideration of risks, benefits and other options for treatment, the patient has consented to  Procedure(s): ICD GENERATOR CHANGE (N/A) as a surgical intervention .  The patient's history has been reviewed, patient examined, no change in status, stable for surgery.  I have reviewed the patient's chart and labs.  Questions were answered to the patient's satisfaction.     Virl Axe

## 2014-06-21 NOTE — CV Procedure (Signed)
Preoperative diagnosis ICM Postoperative diagnosis same/ erosion of insulation of RA lead  Procedure: Generator replacement; Assessment of HV leads repair of A lead;   Following informed consent the patient was brought to the electrophysiology laboratory in place of the fluoroscopic table in the supine position after routine prep and drape lidocaine was infiltrated in the region of the previous incision and carried down to later the device pocket using sharp dissection and electrocautery. The pocket was opened the device was freed up and was explanted.  Interrogation of the previously implanted ICD ventricular lead Buckner [id number]  demonstrated an R wave of 33  millivolts., and impedance of 394 ohms, and a pacing threshold of 0.7 volts at 0.5 msec.    The previously implanted atrial lead St Jude [id number] demonstrated a P-wave amplitude of  2.6 milllivolts and impedance of  0.5 ohms, and a pacing threshold of  0.5 volts at @ 0.61milliseconds.  The leads were inspected. Repair was needed. Medical adhesive was applied to the atrial lead over its entire exposed area The leads were then attached to a St Jude  pulse generator, serial number [id number].    Through the device the P-wave amplitude  Was  2.6 milllivolts and impedance of  380 ohms, and a pacing threshold of  0.5 volts at @ 0.35milliseconds; the ICD ventricular lead demonstrated an R wave of  12  millivolts., and impedance of 390 ohms, and a pacing threshold of  0 7 volts at 0.5 msec  High voltage impedances were 90 ohms  The pocket was irrigated with antibiotic containing saline solution hemostasis was assured and the leads and the device were placed in the pocket. The wound was then closed in 2 layers in normal fashion.  The patient tolerated the procedure without apparent complication.  DFT testing was not performed  EBL Minimal   Virl Axe   \

## 2014-06-21 NOTE — Discharge Instructions (Signed)
Pacemaker Battery Change, Care After °Refer to this sheet in the next few weeks. These instructions provide you with information on caring for yourself after your procedure. Your health care provider may also give you more specific instructions. Your treatment has been planned according to current medical practices, but problems sometimes occur. Call your health care provider if you have any problems or questions after your procedure. °WHAT TO EXPECT AFTER THE PROCEDURE °After your procedure, it is typical to have the following sensations: °· Soreness at the pacemaker site. °HOME CARE INSTRUCTIONS  °· Keep the incision clean and dry. °· Unless advised otherwise, you may shower beginning 48 hours after your procedure. °· For the first week after the replacement, avoid stretching motions that pull at the incision site, and avoid heavy exercise with the arm that is on the same side as the incision. °· Take medicines only as directed by your health care provider. °· Keep all follow-up visits as directed by your health care provider. °SEEK MEDICAL CARE IF:  °· You have pain at the incision site that is not relieved by over-the-counter or prescription medicine. °· There is drainage or pus from the incision site. °· There is swelling larger than a lime at the incision site. °· You develop red streaking that extends above or below the incision site. °· You feel brief, intermittent palpitations, light-headedness, or any symptoms that you feel might be related to your heart. °SEEK IMMEDIATE MEDICAL CARE IF:  °· You experience chest pain that is different than the pain at the pacemaker site. °· You experience shortness of breath. °· You have palpitations or irregular heartbeat. °· You have light-headedness that does not go away quickly. °· You faint. °· You have pain that gets worse and is not relieved by medicine. °Document Released: 06/22/2013 Document Revised: 01/16/2014 Document Reviewed: 06/22/2013 °ExitCare® Patient  Information ©2015 [employer]. This information is not intended to replace advice given to you by your health care provider. Make sure you discuss any questions you have with your health care provider. ° °

## 2014-06-23 ENCOUNTER — Other Ambulatory Visit: Payer: Self-pay | Admitting: Cardiology

## 2014-06-26 ENCOUNTER — Telehealth: Payer: Self-pay | Admitting: Internal Medicine

## 2014-06-26 NOTE — Telephone Encounter (Signed)
New message     When can patient take off bandage.  He recently got a new pacemaker/defibulator.

## 2014-06-26 NOTE — Telephone Encounter (Signed)
Instructed pt ok to take off "saran wrap" looking bandage but leave on steri strips.

## 2014-07-03 ENCOUNTER — Encounter: Payer: Self-pay | Admitting: Internal Medicine

## 2014-07-03 ENCOUNTER — Ambulatory Visit (INDEPENDENT_AMBULATORY_CARE_PROVIDER_SITE_OTHER): Payer: Medicare Other | Admitting: *Deleted

## 2014-07-03 ENCOUNTER — Telehealth: Payer: Self-pay | Admitting: Cardiology

## 2014-07-03 DIAGNOSIS — I5022 Chronic systolic (congestive) heart failure: Secondary | ICD-10-CM

## 2014-07-03 DIAGNOSIS — I255 Ischemic cardiomyopathy: Secondary | ICD-10-CM

## 2014-07-03 DIAGNOSIS — I4589 Other specified conduction disorders: Secondary | ICD-10-CM

## 2014-07-03 LAB — MDC_IDC_ENUM_SESS_TYPE_INCLINIC
Battery Remaining Longevity: 86.4 mo
Brady Statistic RA Percent Paced: 96 %
Brady Statistic RV Percent Paced: 4.2 %
HIGH POWER IMPEDANCE MEASURED VALUE: 61.875
Implantable Pulse Generator Serial Number: 7225183
Lead Channel Impedance Value: 350 Ohm
Lead Channel Impedance Value: 375 Ohm
Lead Channel Pacing Threshold Amplitude: 0.5 V
Lead Channel Pacing Threshold Pulse Width: 0.5 ms
Lead Channel Sensing Intrinsic Amplitude: 2.6 mV
Lead Channel Setting Pacing Amplitude: 1.5 V
Lead Channel Setting Pacing Amplitude: 2.5 V
Lead Channel Setting Pacing Pulse Width: 0.5 ms
Lead Channel Setting Sensing Sensitivity: 0.5 mV
MDC IDC MSMT LEADCHNL RV PACING THRESHOLD AMPLITUDE: 0.75 V
MDC IDC MSMT LEADCHNL RV PACING THRESHOLD AMPLITUDE: 0.75 V
MDC IDC MSMT LEADCHNL RV PACING THRESHOLD PULSEWIDTH: 0.5 ms
MDC IDC MSMT LEADCHNL RV PACING THRESHOLD PULSEWIDTH: 0.5 ms
MDC IDC MSMT LEADCHNL RV SENSING INTR AMPL: 12 mV
MDC IDC SESS DTM: 20151019112617
MDC IDC SET ZONE DETECTION INTERVAL: 250 ms
MDC IDC SET ZONE DETECTION INTERVAL: 300 ms
MDC IDC SET ZONE DETECTION INTERVAL: 340 ms

## 2014-07-03 NOTE — Progress Notes (Signed)
Wound check appointment. Wound without redness or edema. Incision edges approximated, wound well healed. Normal device function. Thresholds, sensing, and impedances consistent with implant measurements. Device programmed at appropriate safety margins. Histogram distribution appropriate for patient and level of activity. No mode switches or ventricular arrhythmias noted. Patient educated about wound care, arm mobility, lifting restrictions, shock plan. ROV in 3 months with SK. 

## 2014-07-03 NOTE — Telephone Encounter (Signed)
Pt called and stated that his monitor was beeping and all 5 lights are blinking orange. I informed pt to call tech services.

## 2014-08-24 ENCOUNTER — Encounter (HOSPITAL_COMMUNITY): Payer: Self-pay | Admitting: Internal Medicine

## 2014-09-16 DIAGNOSIS — R05 Cough: Secondary | ICD-10-CM | POA: Diagnosis not present

## 2014-09-16 DIAGNOSIS — J209 Acute bronchitis, unspecified: Secondary | ICD-10-CM | POA: Diagnosis not present

## 2014-09-16 DIAGNOSIS — R0602 Shortness of breath: Secondary | ICD-10-CM | POA: Diagnosis not present

## 2014-09-18 ENCOUNTER — Encounter: Payer: Self-pay | Admitting: Nurse Practitioner

## 2014-09-18 ENCOUNTER — Ambulatory Visit (INDEPENDENT_AMBULATORY_CARE_PROVIDER_SITE_OTHER): Payer: Medicare Other | Admitting: Nurse Practitioner

## 2014-09-18 VITALS — BP 130/64 | HR 71 | Temp 98.3°F | Ht 67.0 in | Wt 172.0 lb

## 2014-09-18 DIAGNOSIS — R062 Wheezing: Secondary | ICD-10-CM | POA: Diagnosis not present

## 2014-09-18 DIAGNOSIS — J988 Other specified respiratory disorders: Secondary | ICD-10-CM | POA: Diagnosis not present

## 2014-09-18 MED ORDER — GUAIFENESIN ER 600 MG PO TB12: 600.0000 mg | ORAL_TABLET | Freq: Two times a day (BID) | ORAL | Status: DC

## 2014-09-18 MED ORDER — ALBUTEROL SULFATE HFA 108 (90 BASE) MCG/ACT IN AERS: 2.0000 | INHALATION_SPRAY | RESPIRATORY_TRACT | Status: DC | PRN

## 2014-09-18 MED ORDER — DOXYCYCLINE HYCLATE 100 MG PO TABS: 100.0000 mg | ORAL_TABLET | Freq: Two times a day (BID) | ORAL | Status: DC

## 2014-09-18 NOTE — Progress Notes (Signed)
Pre visit review using our clinic review tool, if applicable. No additional management support is needed unless otherwise documented below in the visit note. 

## 2014-09-18 NOTE — Patient Instructions (Signed)
Start antibiotic. Eat yogurt daily to help prevent antibiotic -associated diarrhea  Start using inhaler twice daily today. Use twice daily for 3-4 days , then if needed for cough. Wait 6 hours between uses.  Start guaifenesen today. Sip fluids to stay hydrated.  Follow up in 2 weeks.

## 2014-09-18 NOTE — Progress Notes (Signed)
   Subjective:    Patient ID: [name] Curia., male    DOB: 1929/05/18, 81 y.o.   MRN: [id number]  HPI Comments: Pt was seen at [facility] 2 d ago. Im steroid given. No improvement.  Cough This is a new problem. The current episode started in the past 7 days (5d). The problem has been gradually worsening. The problem occurs hourly. The cough is non-productive. Associated symptoms include chills, headaches, nasal congestion, a sore throat and wheezing. Pertinent negatives include no chest pain, fever or shortness of breath. Nothing aggravates the symptoms. Treatments tried: received IM steroid at [facility] 2d ago. The treatment provided no relief.      Review of Systems  Constitutional: Positive for chills. Negative for fever.  HENT: Positive for congestion and sore throat.   Respiratory: Positive for cough and wheezing. Negative for chest tightness and shortness of breath.   Cardiovascular: Negative for chest pain.  Neurological: Positive for headaches.       Objective:   Physical Exam  Constitutional: He is oriented to person, place, and time. He appears well-developed and well-nourished. No distress.  HENT:  Head: Normocephalic and atraumatic.  Right Ear: External ear normal.  Left Ear: External ear normal.  Mouth/Throat: Oropharynx is clear and moist. No oropharyngeal exudate.  Eyes: Conjunctivae are normal. Right eye exhibits no discharge. Left eye exhibits no discharge.  Neck: Normal range of motion. Neck supple. No thyromegaly present.  Cardiovascular: Normal rate, regular rhythm and normal heart sounds.   No murmur heard. Pulmonary/Chest: Effort normal. No respiratory distress. He has wheezes (expiratory).  Lymphadenopathy:    He has no cervical adenopathy.  Neurological: He is alert and oriented to person, place, and time.  Skin: Skin is warm and dry.  Psychiatric: He has a normal mood and affect. His behavior is normal. Thought content normal.  Vitals reviewed.         Assessment & Plan:  1. Respiratory infection - doxycycline (VIBRA-TABS) 100 MG tablet; Take 1 tablet (100 mg total) by mouth 2 (two) times daily.  Dispense: 14 tablet; Refill: 0 - albuterol (PROVENTIL HFA;VENTOLIN HFA) 108 (90 BASE) MCG/ACT inhaler; Inhale 2 puffs into the lungs every 4 (four) hours as needed for wheezing.  Dispense: 1 Inhaler; Refill: 0 - guaiFENesin (MUCINEX) 600 MG 12 hr tablet; Take 1 tablet (600 mg total) by mouth 2 (two) times daily.  Dispense: 30 tablet; Refill: 0  2. Wheezing - albuterol (PROVENTIL HFA;VENTOLIN HFA) 108 (90 BASE) MCG/ACT inhaler; Inhale 2 puffs into the lungs every 4 (four) hours as needed for wheezing.  Dispense: 1 Inhaler; Refill: 0  F/u PRN

## 2014-09-19 ENCOUNTER — Telehealth: Payer: Self-pay | Admitting: Internal Medicine

## 2014-09-19 ENCOUNTER — Encounter: Payer: Self-pay | Admitting: Internal Medicine

## 2014-09-19 DIAGNOSIS — J988 Other specified respiratory disorders: Secondary | ICD-10-CM

## 2014-09-19 MED ORDER — GUAIFENESIN ER 600 MG PO TB12: 600.0000 mg | ORAL_TABLET | Freq: Two times a day (BID) | ORAL | Status: DC

## 2014-09-19 MED ORDER — DOXYCYCLINE HYCLATE 100 MG PO TABS: 100.0000 mg | ORAL_TABLET | Freq: Two times a day (BID) | ORAL | Status: DC

## 2014-09-19 NOTE — Telephone Encounter (Signed)
Pt saw NP Layne yesterday resent to [facility]...Johny Chess

## 2014-09-19 NOTE — Telephone Encounter (Signed)
Pt wife called in, went to pick up scripts, they were not there.  [facility]  Doxycycline Guaifenesin  Please resend to [facility] [phone]

## 2014-09-20 ENCOUNTER — Ambulatory Visit: Payer: Medicare Other | Admitting: Internal Medicine

## 2014-09-21 ENCOUNTER — Telehealth: Payer: Self-pay | Admitting: Internal Medicine

## 2014-09-21 DIAGNOSIS — J988 Other specified respiratory disorders: Secondary | ICD-10-CM

## 2014-09-21 DIAGNOSIS — R062 Wheezing: Secondary | ICD-10-CM

## 2014-09-21 MED ORDER — ALBUTEROL SULFATE HFA 108 (90 BASE) MCG/ACT IN AERS: 2.0000 | INHALATION_SPRAY | RESPIRATORY_TRACT | Status: DC | PRN

## 2014-09-21 NOTE — Telephone Encounter (Signed)
Rx resent to [facility]. Patient notified.

## 2014-09-21 NOTE — Telephone Encounter (Signed)
Cell phone number can also be reached on [phone].  Patient was given three scripts by Nicky Pugh earlier this week.  The scripts went to [place] rather than their local drug.  They received 14 pills on the antibiotic and they did not receive the other two meds.  States one was an inhaler.  Would like the scripts sent to [facility].  Would also like to verify that patient received the correct amount of antibiotic.

## 2014-09-26 ENCOUNTER — Ambulatory Visit (INDEPENDENT_AMBULATORY_CARE_PROVIDER_SITE_OTHER): Payer: Medicare Other | Admitting: Internal Medicine

## 2014-09-26 ENCOUNTER — Encounter: Payer: Self-pay | Admitting: Internal Medicine

## 2014-09-26 VITALS — BP 150/76 | HR 70 | Ht 67.0 in | Wt 169.2 lb

## 2014-09-26 DIAGNOSIS — I5022 Chronic systolic (congestive) heart failure: Secondary | ICD-10-CM | POA: Diagnosis not present

## 2014-09-26 DIAGNOSIS — I255 Ischemic cardiomyopathy: Secondary | ICD-10-CM

## 2014-09-26 LAB — MDC_IDC_ENUM_SESS_TYPE_INCLINIC
Battery Remaining Longevity: 85.2 mo
Brady Statistic RA Percent Paced: 92 %
Brady Statistic RV Percent Paced: 5.6 %
Date Time Interrogation Session: 20160112162531
HighPow Impedance: 70.875
Implantable Pulse Generator Serial Number: 7225183
Lead Channel Impedance Value: 375 Ohm
Lead Channel Pacing Threshold Amplitude: 0.5 V
Lead Channel Pacing Threshold Amplitude: 0.75 V
Lead Channel Pacing Threshold Pulse Width: 0.5 ms
Lead Channel Sensing Intrinsic Amplitude: 2.5 mV
Lead Channel Setting Pacing Amplitude: 1.5 V
Lead Channel Setting Pacing Pulse Width: 0.5 ms
MDC IDC MSMT LEADCHNL RA PACING THRESHOLD PULSEWIDTH: 0.5 ms
MDC IDC MSMT LEADCHNL RV IMPEDANCE VALUE: 387.5 Ohm
MDC IDC MSMT LEADCHNL RV PACING THRESHOLD AMPLITUDE: 0.75 V
MDC IDC MSMT LEADCHNL RV PACING THRESHOLD PULSEWIDTH: 0.5 ms
MDC IDC MSMT LEADCHNL RV SENSING INTR AMPL: 12 mV
MDC IDC SET LEADCHNL RV PACING AMPLITUDE: 2.5 V
MDC IDC SET LEADCHNL RV SENSING SENSITIVITY: 0.5 mV
MDC IDC SET ZONE DETECTION INTERVAL: 250 ms
Zone Setting Detection Interval: 300 ms
Zone Setting Detection Interval: 340 ms

## 2014-09-26 NOTE — Patient Instructions (Addendum)
Your physician has recommended you make the following change in your medication:  1) Increase ramipril to 2.5 mg by mouth twice daily- start this 2 weeks prior to seeing Dr. Ron [name]  Remote monitoring is used to monitor your ICD from home. This monitoring reduces the number of office visits required to check your device to one time per year. It allows [place] to keep an eye on the functioning of your device to ensure it is working properly. You are scheduled for a device check from home on 12-26-2014. You may send your transmission at any time that day. If you have a wireless device, the transmission will be sent automatically. After your physician reviews your transmission, you will receive a postcard with your next transmission date.  Your physician recommends that you schedule a follow-up appointment in: October 2016 with Dr.Klein

## 2014-09-26 NOTE — Progress Notes (Signed)
Patient Care Team: Cassandria Anger, MD as PCP - General Carlena Bjornstad, MD (Cardiology) Deboraha Sprang, MD (Cardiology)   HPI  [name] Limbach. is a 81 y.o. male seen in followup for an ICD implanted for primary prevention in the setting of ischemic heart disease.  His device reached ERI and underwent generator replacement  10/15.   The patient denies chest pain, shortness of breath, nocturnal dyspnea, orthopnea or peripheral edema. There have been no palpitations, lightheadedness or syncope.         Past Medical History  Diagnosis Date  . History of colon polyps   . Hyperlipidemia   . Hypertension   . 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 S/P CABG (coronary artery bypass graft) 1992  . ICD (implantable cardiac defibrillator) in place 03/2007    July, 2008  . Chronotropic incompetence     treated w/pacemaker, ICD 7.2008. This was placed after CPX done post 2008 cath. CPX showed only chronotropic incompetence  . Arrhythmia   . CHF (congestive heart failure)   . Syncopal episodes     relative hypotension  . Mitral regurgitation     mild  . Numbness and tingling of left arm and leg     improved after tx w/prednisone  . Hx of CABG     1992  . Grief reaction     Patient was a son in weight in 2001  . Arthritis   . Blood transfusion 1969  . Myocardial infarction   . Ejection fraction     .    Past Surgical History  Procedure Laterality Date  . Coronary artery bypass graft  1992  . Cardiac catheterization    . Cardiac defibrillator placement  2008  . Leg surgery  1942    Fx leg repair with artificial bone  . Cataract extraction  12/14 & 1/15    right eye in December and Left eye in January  . Implantable cardioverter defibrillator (icd) generator change N/A 06/21/2014   Procedure: ICD GENERATOR CHANGE;  Surgeon: Deboraha Sprang, MD;  Location: [facility] CATH LAB;  Service: Cardiovascular;  Laterality: N/A;    Current Outpatient Prescriptions  Medication Sig Dispense Refill  . albuterol (PROVENTIL HFA;VENTOLIN HFA) 108 (90 BASE) MCG/ACT inhaler Inhale 2 puffs into the lungs every 4 (four) hours as needed for wheezing. 1 Inhaler 0  . aspirin 81 MG tablet Take 1 tablet (81 mg total) by mouth daily. 30 tablet 11  . atorvastatin (LIPITOR) 40 MG tablet Take 40 mg by mouth daily.    . carvedilol (COREG) 25 MG tablet TAKE 1 TABLET TWICE A DAY (Patient taking differently: TAKE 1 TABLET BY MOUTH TWICE A DAY) 180 tablet 2  . Cholecalciferol 1000 UNITS tablet Take 1,000 Units by mouth daily.      . digoxin (LANOXIN) 0.125 MG tablet Take 0.5 tablets (0.0625 mg total) by mouth daily. 90 tablet 1  . Multiple Vitamins-Minerals (PRESERVISION AREDS 2) CAPS Take 1 capsule by mouth 2 (two) times daily.    . nitroGLYCERIN (NITROLINGUAL) 0.4 MG/SPRAY spray Place 1 spray under the tongue every 5 (five) minutes x 3 doses as needed for chest pain.     . ramipril (ALTACE) 2.5 MG capsule Take 2.5 mg by mouth daily.  No current facility-administered medications for this visit.    Allergies  Allergen Reactions  . Morphine     "increased white corpuscles"  . Ramipril Other (See Comments)    REACTION: cough if dose is over 2.5mg     Review of Systems negative except from HPI and PMH  Physical Exam BP 150/76 mmHg  Pulse 70  Ht 5\' 7"  (1.702 m)  Wt 169 lb 3.2 oz (76.749 kg)  BMI 26.49 kg/m2  SpO2 96% Well developed and well nourished in no acute distress HENT normal E scleral and icterus clear Neck Supple JVP flat; carotids brisk and full Clear to ausculation Device pocket well healed; without hematoma or erythema.  There is no tethering   *Regular rate and rhythm, no murmurs gallops or rub Soft with active bowel sounds No clubbing cyanosis  Edema Alert and oriented, grossly  normal motor and sensory function Skin Warm and Dry    Assessment and  Plan  Ischemic cardiomyopathy  Sinus Node dysfunction  Hypertension  Implantable Defibrillator  St Jude    Overall is stable. He is euvolemic. No symptoms of ischemia. Blood pressures elevated. Tends to be elevated also at home. He is scheduled to see Dr. Allena Napoleon in about 4 weeks' time. 2 weeks prior to that visit, we will have him increase his ramipril from 2.5--5 mg. Way at that visit he can have both his blood pressure rechecked as well as metabolic profile to assess potassium and kidney function

## 2014-10-03 ENCOUNTER — Ambulatory Visit (INDEPENDENT_AMBULATORY_CARE_PROVIDER_SITE_OTHER): Payer: Medicare Other | Admitting: Internal Medicine

## 2014-10-03 ENCOUNTER — Encounter: Payer: Self-pay | Admitting: Internal Medicine

## 2014-10-03 VITALS — BP 152/68 | HR 77 | Temp 97.9°F

## 2014-10-03 DIAGNOSIS — I5022 Chronic systolic (congestive) heart failure: Secondary | ICD-10-CM

## 2014-10-03 DIAGNOSIS — I10 Essential (primary) hypertension: Secondary | ICD-10-CM

## 2014-10-03 DIAGNOSIS — I2510 Atherosclerotic heart disease of native coronary artery without angina pectoris: Secondary | ICD-10-CM | POA: Diagnosis not present

## 2014-10-03 DIAGNOSIS — N183 Chronic kidney disease, stage 3 unspecified: Secondary | ICD-10-CM

## 2014-10-03 DIAGNOSIS — N189 Chronic kidney disease, unspecified: Secondary | ICD-10-CM

## 2014-10-03 DIAGNOSIS — I2583 Coronary atherosclerosis due to lipid rich plaque: Secondary | ICD-10-CM

## 2014-10-03 MED ORDER — LOSARTAN POTASSIUM 100 MG PO TABS: 100.0000 mg | ORAL_TABLET | Freq: Every day | ORAL | Status: DC

## 2014-10-03 NOTE — Assessment & Plan Note (Signed)
D/c Ramipril - cough Start Losartan 100 mg/d

## 2014-10-03 NOTE — Progress Notes (Signed)
Pre visit review using our clinic review tool, if applicable. No additional management support is needed unless otherwise documented below in the visit note. He has a new pacemaker/defib C/o cough from Ramipril   Subjective:    HPI    The patient presents for a follow-up of  chronic hypertension, chronic dyslipidemia, CHF controlled with medicines. He is married  Wt Readings from Last 3 Encounters:  09/26/14 169 lb 3.2 oz (76.749 kg)  09/18/14 172 lb (78.019 kg)  06/21/14 171 lb (77.565 kg)   BP Readings from Last 3 Encounters:  10/03/14 152/68  09/26/14 150/76  09/18/14 130/64      Review of Systems  Constitutional: Negative for appetite change, fatigue and unexpected weight change.  HENT: Negative for congestion, nosebleeds, sneezing, sore throat and trouble swallowing.   Eyes: Negative for itching and visual disturbance.  Cardiovascular: Negative for chest pain, palpitations and leg swelling.  Gastrointestinal: Negative for nausea, blood in stool and abdominal distention.  Genitourinary: Negative for frequency and hematuria.  Musculoskeletal: Negative for back pain, gait problem, joint swelling and neck pain.  Skin: Negative for rash.  Neurological: Negative for dizziness, tremors, speech difficulty and weakness.  Psychiatric/Behavioral: Negative for sleep disturbance, dysphoric mood and agitation. The patient is not nervous/anxious.        Objective:   Physical Exam  Constitutional: He is oriented to person, place, and time. He appears well-developed and well-nourished. No distress.  HENT:  Mouth/Throat: Oropharynx is clear and moist.  Eyes: Conjunctivae are normal. Pupils are equal, round, and reactive to light.  Neck: Normal range of motion. No JVD present. No thyromegaly present.  Cardiovascular: Normal rate, regular rhythm, normal heart sounds and intact distal pulses.  Exam reveals no gallop and no friction rub.   No murmur heard. Pulmonary/Chest: Effort  normal and breath sounds normal. No respiratory distress. He has no wheezes. He has no rales. He exhibits no tenderness.  Abdominal: Soft. Bowel sounds are normal. He exhibits no distension and no mass. There is no tenderness. There is no rebound and no guarding.  Genitourinary: Rectum normal.  Musculoskeletal: Normal range of motion. He exhibits no edema and no tenderness.  Lymphadenopathy:    He has no cervical adenopathy.  Neurological: He is alert and oriented to person, place, and time. He has normal reflexes. No cranial nerve deficit. He exhibits normal muscle tone. Coordination normal.  Skin: Skin is warm and dry. No rash noted.  Psychiatric: He has a normal mood and affect. His behavior is normal. Judgment and thought content normal.     Lab Results  Component Value Date   WBC 8.3 06/14/2014   HGB 13.1 06/14/2014   HCT 39.0 06/14/2014   PLT 194.0 06/14/2014   GLUCOSE 105* 06/14/2014   CHOL 118 07/29/2013   TRIG 178.0* 07/29/2013   HDL 32.20* 07/29/2013   LDLDIRECT 50.1 04/28/2011   LDLCALC 50 07/29/2013   ALT 17 07/29/2013   AST 18 07/29/2013   NA 139 06/14/2014   K 4.2 06/14/2014   CL 107 06/14/2014   CREATININE 1.1 06/14/2014   BUN 18 06/14/2014   CO2 25 06/14/2014   TSH 2.20 07/29/2013   PSA 2.17 07/29/2013   INR 0.9 RATIO 03/24/2007   HGBA1C 6.2 02/13/2010        Assessment & Plan:

## 2014-10-03 NOTE — Assessment & Plan Note (Signed)
Will watch Creat, K Losartan w/caution

## 2014-10-03 NOTE — Assessment & Plan Note (Signed)
Continue with current prescription therapy as reflected on the Med list.  

## 2014-10-06 ENCOUNTER — Other Ambulatory Visit: Payer: Self-pay | Admitting: Cardiology

## 2014-10-09 ENCOUNTER — Other Ambulatory Visit: Payer: Self-pay

## 2014-10-22 ENCOUNTER — Other Ambulatory Visit: Payer: Self-pay | Admitting: Cardiology

## 2014-11-13 ENCOUNTER — Encounter: Payer: Self-pay | Admitting: Cardiology

## 2014-11-13 ENCOUNTER — Ambulatory Visit (INDEPENDENT_AMBULATORY_CARE_PROVIDER_SITE_OTHER): Payer: Medicare Other | Admitting: Cardiology

## 2014-11-13 VITALS — BP 128/70 | HR 80 | Ht 67.0 in | Wt 169.6 lb

## 2014-11-13 DIAGNOSIS — Z95810 Presence of automatic (implantable) cardiac defibrillator: Secondary | ICD-10-CM

## 2014-11-13 DIAGNOSIS — I10 Essential (primary) hypertension: Secondary | ICD-10-CM | POA: Diagnosis not present

## 2014-11-13 DIAGNOSIS — I5022 Chronic systolic (congestive) heart failure: Secondary | ICD-10-CM

## 2014-11-13 DIAGNOSIS — I2510 Atherosclerotic heart disease of native coronary artery without angina pectoris: Secondary | ICD-10-CM

## 2014-11-13 LAB — BASIC METABOLIC PANEL
BUN: 13 mg/dL (ref 6–23)
CALCIUM: 9.3 mg/dL (ref 8.4–10.5)
CHLORIDE: 105 meq/L (ref 96–112)
CO2: 28 meq/L (ref 19–32)
CREATININE: 1.02 mg/dL (ref 0.40–1.50)
GFR: 74.1 mL/min (ref >60.00)
GLUCOSE: 106 mg/dL — AB (ref 70–99)
Potassium: 4.3 mEq/L (ref 3.5–5.1)
Sodium: 138 mEq/L (ref 135–145)

## 2014-11-13 MED ORDER — LOSARTAN POTASSIUM 100 MG PO TABS: 100.0000 mg | ORAL_TABLET | Freq: Every day | ORAL | Status: DC

## 2014-11-13 NOTE — Assessment & Plan Note (Signed)
The patient's ICD was upgraded in these doing well.

## 2014-11-13 NOTE — Assessment & Plan Note (Signed)
His volume status is stable. No change in therapy. 

## 2014-11-13 NOTE — Progress Notes (Signed)
Cardiology Office Note   Date:  11/13/2014   ID:  [name] Curia., DOB 02-Oct-1928, MRN [id number]  PCP:  [name] Kehr, MD  Cardiologist:  [name] Argyle, MD   Chief Complaint  Patient presents with  . Appointment    Follow-up coronary artery disease.      History of Present Illness: [name] Agard. is a 81 y.o. male who presents today to follow-up coronary disease and ischemic cardiomyopathy and history of hypertension. In the past the patient had had a syncopal episode. I felt that that episode was related to hypotension. On that basis his ramapril  dose had been reduced to 2.5 mg. Recently when he saw Dr. Caryl Comes, his pressure was elevated. The patient was hesitant to increase his ramapril based on the history. Therefore he was given losartan 100 mg. He takes his pressure at home. It is regularly in the range of 518 systolic. He feels very good with this. However he is on both losartan and ramapril.    Past Medical History  Diagnosis Date  . History of colon polyps   . Hyperlipidemia   . Hypertension   . 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 S/P CABG (coronary artery bypass graft) 1992  . ICD (implantable cardiac defibrillator) in place 03/2007    July, 2008  . Chronotropic incompetence     treated w/pacemaker, ICD 7.2008. This was placed after CPX done post 2008 cath. CPX showed only chronotropic incompetence  . Arrhythmia   . CHF (congestive heart failure)   . Syncopal episodes     relative hypotension  . Mitral regurgitation     mild  . Numbness and tingling of left arm and leg     improved after tx w/prednisone  . Hx of CABG     1992  . Grief reaction     Patient was a son in weight in 2001  . Arthritis   . Blood transfusion 1969  . Myocardial infarction   . Ejection fraction       .    Past Surgical History  Procedure Laterality Date  . Coronary artery bypass graft  1992  . Cardiac catheterization    . Cardiac defibrillator placement  2008  . Leg surgery  1942    Fx leg repair with artificial bone  . Cataract extraction  12/14 & 1/15    right eye in December and Left eye in January  . Implantable cardioverter defibrillator (icd) generator change N/A 06/21/2014    Procedure: ICD GENERATOR CHANGE;  Surgeon: Deboraha Sprang, MD;  Location: [facility] CATH LAB;  Service: Cardiovascular;  Laterality: N/A;    Patient Active Problem List   Diagnosis Date Noted  . Hx of CABG     Priority: High  . Chronotropic incompetence     Priority: High  . Automatic implantable cardioverter-defibrillator in situ 03/16/2007    Priority: High  . Chronic systolic CHF (congestive heart failure) 11/18/2013  . Ejection fraction   . CAD (coronary artery disease)   . Well adult exam 07/13/2012  . Chronic renal insufficiency, stage III (moderate) 07/21/2011  . Ischemic cardiomyopathy   . Syncopal episodes   . Mitral regurgitation   . Numbness and tingling of left arm and leg   . PARESTHESIA 10/22/2009  . TOBACCO USE,  QUIT 10/22/2009  . SEBACEOUS CYST 08/27/2009  . Anemia of other chronic disease 02/18/2008  . HYPERLIPIDEMIA 05/03/2007  . Essential hypertension 05/03/2007  . COLONIC POLYPS, HX OF 05/03/2007      Current Outpatient Prescriptions  Medication Sig Dispense Refill  . albuterol (PROVENTIL HFA;VENTOLIN HFA) 108 (90 BASE) MCG/ACT inhaler Inhale 2 puffs into the lungs every 4 (four) hours as needed for wheezing. 1 Inhaler 0  . aspirin 81 MG tablet Take 1 tablet (81 mg total) by mouth daily. 30 tablet 11  . atorvastatin (LIPITOR) 40 MG tablet Take 40 mg by mouth daily.    . carvedilol (COREG) 25 MG tablet TAKE 1 TABLET TWICE A DAY (Patient taking differently: TAKE 1 TABLET BY MOUTH TWICE A DAY) 180 tablet 2  . Cholecalciferol 1000 UNITS tablet Take 1,000 Units by mouth  daily.      . digoxin (LANOXIN) 0.125 MG tablet Take 0.5 tablets (0.0625 mg total) by mouth daily. 90 tablet 1  . losartan (COZAAR) 100 MG tablet Take 1 tablet (100 mg total) by mouth daily. 30 tablet 11  . Multiple Vitamins-Minerals (PRESERVISION AREDS 2) CAPS Take 1 capsule by mouth 2 (two) times daily.    . nitroGLYCERIN (NITROLINGUAL) 0.4 MG/SPRAY spray Place 1 spray under the tongue every 5 (five) minutes x 3 doses as needed for chest pain.     . ramipril (ALTACE) 2.5 MG capsule TAKE 1 CAPSULE DAILY 90 capsule 0   No current facility-administered medications for this visit.    Allergies:   Morphine and Ramipril    Social History:  The patient  reports that he has quit smoking. His smoking use included Cigars. He has never used smokeless tobacco. He reports that he does not drink alcohol or use illicit drugs.   Family History:  The patient's family history includes Colon cancer in his other; Hypertension in his father, mother, and other.    ROS:  Please see the history of present illness.     Patient denies fever, chills, headache, sweats, rash, change in vision, change in hearing, chest pain, cough, nausea or vomiting, urinary symptoms. All other systems are reviewed and are negative.   PHYSICAL EXAM: VS:  BP 128/70 mmHg  Pulse 80  Ht 5\' 7"  (1.702 m)  Wt 169 lb 9.6 oz (76.93 kg)  BMI 26.56 kg/m2 , Patient is oriented to person time and place. Affect is normal. Head is atraumatic. Sclera and conjunctiva are normal. There is no jugular venous distention. Lungs are clear. Respiratory effort is nonlabored. Cardiac exam reveals an S1 and S2. Abdomen is soft. There is no peripheral edema. There are no musculoskeletal deformities. There are no skin rashes. Neurologic is grossly intact.    Recent Labs: 06/14/2014: BUN 18; Creatinine 1.1; Hemoglobin 13.1; Platelets 194.0; Potassium 4.2; Sodium 139    Lipid Panel    Component Value Date/Time   CHOL 118 07/29/2013 1007   TRIG 178.0*  07/29/2013 1007   HDL 32.20* 07/29/2013 1007   CHOLHDL 4 07/29/2013 1007   VLDL 35.6 07/29/2013 1007   LDLCALC 50 07/29/2013 1007   LDLDIRECT 50.1 04/28/2011 0827      Wt Readings from Last 3 Encounters:  11/13/14 169 lb 9.6 oz (76.93 kg)  09/26/14 169 lb 3.2 oz (76.749 kg)  09/18/14 172 lb (78.019 kg)      Current medicines are reviewed       ASSESSMENT AND PLAN:

## 2014-11-13 NOTE — Patient Instructions (Addendum)
Your physician recommends that you continue on your current medications as directed. Please refer to the Current Medication list given to you today. Continue to take both Ramipril and Losartan.  Your physician recommends that you return for lab work in: today Artist)   Your physician recommends that you schedule a follow-up appointment in: 6 to 8 weeks

## 2014-11-13 NOTE — Assessment & Plan Note (Signed)
Coronary disease is stable. No change in therapy. 

## 2014-11-13 NOTE — Assessment & Plan Note (Signed)
Blood pressure is nicely controlled today. With the addition of losartan 100 mg daily added to his prior dose of ramapril 2.5 mg. I have chosen to continue both. He is getting an excellent result from this. Hysterectomy he had relative hypotension with a higher dose of the ACE inhibitor. Renal function will be checked today.

## 2014-11-14 ENCOUNTER — Telehealth: Payer: Self-pay | Admitting: Cardiology

## 2014-11-14 NOTE — Telephone Encounter (Signed)
New Message     Patient is returning nurses call.  Please give patient a call back.  THanks

## 2014-11-14 NOTE — Telephone Encounter (Signed)
This has been handled.  Please see lab result from 2/29 for documentation.

## 2014-12-18 ENCOUNTER — Other Ambulatory Visit: Payer: Self-pay | Admitting: Cardiology

## 2014-12-26 ENCOUNTER — Encounter: Payer: Self-pay | Admitting: Internal Medicine

## 2014-12-26 ENCOUNTER — Ambulatory Visit (INDEPENDENT_AMBULATORY_CARE_PROVIDER_SITE_OTHER): Payer: Medicare Other | Admitting: *Deleted

## 2014-12-26 DIAGNOSIS — I5022 Chronic systolic (congestive) heart failure: Secondary | ICD-10-CM | POA: Diagnosis not present

## 2014-12-26 DIAGNOSIS — I255 Ischemic cardiomyopathy: Secondary | ICD-10-CM

## 2014-12-26 LAB — MDC_IDC_ENUM_SESS_TYPE_REMOTE
HighPow Impedance: 71 Ohm
Implantable Pulse Generator Serial Number: 7225183
Lead Channel Impedance Value: 360 Ohm
Lead Channel Impedance Value: 390 Ohm
Lead Channel Pacing Threshold Amplitude: 0.5 V
Lead Channel Setting Pacing Amplitude: 1.5 V
Lead Channel Setting Pacing Pulse Width: 0.5 ms
Lead Channel Setting Sensing Sensitivity: 0.5 mV
MDC IDC MSMT LEADCHNL RA PACING THRESHOLD PULSEWIDTH: 0.5 ms
MDC IDC MSMT LEADCHNL RA SENSING INTR AMPL: 2.4 mV
MDC IDC SET LEADCHNL RV PACING AMPLITUDE: 2.5 V
MDC IDC SET ZONE DETECTION INTERVAL: 300 ms
MDC IDC SET ZONE DETECTION INTERVAL: 340 ms
MDC IDC STAT BRADY RA PERCENT PACED: 92 %
MDC IDC STAT BRADY RV PERCENT PACED: 6.1 %
Zone Setting Detection Interval: 250 ms

## 2014-12-26 NOTE — Progress Notes (Signed)
Remote ICD transmission.   

## 2015-01-02 ENCOUNTER — Other Ambulatory Visit (INDEPENDENT_AMBULATORY_CARE_PROVIDER_SITE_OTHER): Payer: Medicare Other

## 2015-01-02 DIAGNOSIS — I2583 Coronary atherosclerosis due to lipid rich plaque: Secondary | ICD-10-CM

## 2015-01-02 DIAGNOSIS — I2510 Atherosclerotic heart disease of native coronary artery without angina pectoris: Secondary | ICD-10-CM

## 2015-01-02 DIAGNOSIS — N183 Chronic kidney disease, stage 3 unspecified: Secondary | ICD-10-CM

## 2015-01-02 DIAGNOSIS — I5022 Chronic systolic (congestive) heart failure: Secondary | ICD-10-CM

## 2015-01-02 DIAGNOSIS — N189 Chronic kidney disease, unspecified: Secondary | ICD-10-CM | POA: Diagnosis not present

## 2015-01-02 DIAGNOSIS — I10 Essential (primary) hypertension: Secondary | ICD-10-CM | POA: Diagnosis not present

## 2015-01-02 LAB — LIPID PANEL
Cholesterol: 96 mg/dL (ref 0–200)
HDL: 26.1 mg/dL — ABNORMAL LOW (ref >39.00)
LDL Cholesterol: 34 mg/dL (ref 0–99)
NonHDL: 69.9
Total CHOL/HDL Ratio: 4
Triglycerides: 178 mg/dL — ABNORMAL HIGH (ref 0.0–149.0)
VLDL: 35.6 mg/dL (ref 0.0–40.0)

## 2015-01-02 LAB — HEPATIC FUNCTION PANEL
ALBUMIN: 4 g/dL (ref 3.5–5.2)
ALT: 15 U/L (ref 0–53)
AST: 14 U/L (ref 0–37)
Alkaline Phosphatase: 103 U/L (ref 39–117)
BILIRUBIN DIRECT: 0.2 mg/dL (ref 0.0–0.3)
TOTAL PROTEIN: 6.7 g/dL (ref 6.0–8.3)
Total Bilirubin: 0.9 mg/dL (ref 0.2–1.2)

## 2015-01-02 LAB — BASIC METABOLIC PANEL
BUN: 19 mg/dL (ref 6–23)
CO2: 29 meq/L (ref 19–32)
Calcium: 9.3 mg/dL (ref 8.4–10.5)
Chloride: 106 mEq/L (ref 96–112)
Creatinine, Ser: 1.03 mg/dL (ref 0.40–1.50)
GFR: 73.25 mL/min (ref >60.00)
GLUCOSE: 111 mg/dL — AB (ref 70–99)
POTASSIUM: 4.3 meq/L (ref 3.5–5.1)
Sodium: 139 mEq/L (ref 135–145)

## 2015-01-02 LAB — TSH: TSH: 2.34 u[IU]/mL (ref 0.35–4.50)

## 2015-01-03 ENCOUNTER — Ambulatory Visit (INDEPENDENT_AMBULATORY_CARE_PROVIDER_SITE_OTHER): Payer: Medicare Other | Admitting: Internal Medicine

## 2015-01-03 ENCOUNTER — Encounter: Payer: Self-pay | Admitting: Internal Medicine

## 2015-01-03 VITALS — BP 139/76 | HR 85 | Temp 97.8°F | Resp 18 | Wt 169.0 lb

## 2015-01-03 DIAGNOSIS — I255 Ischemic cardiomyopathy: Secondary | ICD-10-CM | POA: Diagnosis not present

## 2015-01-03 DIAGNOSIS — I2510 Atherosclerotic heart disease of native coronary artery without angina pectoris: Secondary | ICD-10-CM | POA: Diagnosis not present

## 2015-01-03 DIAGNOSIS — I2583 Coronary atherosclerosis due to lipid rich plaque: Secondary | ICD-10-CM

## 2015-01-03 DIAGNOSIS — I10 Essential (primary) hypertension: Secondary | ICD-10-CM

## 2015-01-03 LAB — DIGOXIN LEVEL: DIGOXIN LVL: 0.6 ng/mL — AB (ref 0.8–2.0)

## 2015-01-03 NOTE — Progress Notes (Signed)
   Subjective:    HPI    The patient presents for a follow-up of  chronic hypertension, chronic dyslipidemia, CHF controlled with medicines. He is married  Wt Readings from Last 3 Encounters:  01/03/15 169 lb (76.658 kg)  11/13/14 169 lb 9.6 oz (76.93 kg)  09/26/14 169 lb 3.2 oz (76.749 kg)   BP Readings from Last 3 Encounters:  01/03/15 140/76  11/13/14 128/70  10/03/14 152/68      Review of Systems  Constitutional: Negative for appetite change, fatigue and unexpected weight change.  HENT: Negative for congestion, nosebleeds, sneezing, sore throat and trouble swallowing.   Eyes: Negative for itching and visual disturbance.  Cardiovascular: Negative for chest pain, palpitations and leg swelling.  Gastrointestinal: Negative for nausea, blood in stool and abdominal distention.  Genitourinary: Negative for frequency and hematuria.  Musculoskeletal: Negative for back pain, joint swelling, gait problem and neck pain.  Skin: Negative for rash.  Neurological: Negative for dizziness, tremors, speech difficulty and weakness.  Psychiatric/Behavioral: Negative for sleep disturbance, dysphoric mood and agitation. The patient is not nervous/anxious.        Objective:   Physical Exam  Constitutional: He is oriented to person, place, and time. He appears well-developed and well-nourished. No distress.  HENT:  Mouth/Throat: Oropharynx is clear and moist.  Eyes: Conjunctivae are normal. Pupils are equal, round, and reactive to light.  Neck: Normal range of motion. No JVD present. No thyromegaly present.  Cardiovascular: Normal rate, regular rhythm, normal heart sounds and intact distal pulses.  Exam reveals no gallop and no friction rub.   No murmur heard. Pulmonary/Chest: Effort normal and breath sounds normal. No respiratory distress. He has no wheezes. He has no rales. He exhibits no tenderness.  Abdominal: Soft. Bowel sounds are normal. He exhibits no distension and no mass. There  is no tenderness. There is no rebound and no guarding.  Genitourinary: Rectum normal.  Musculoskeletal: Normal range of motion. He exhibits no edema or tenderness.  Lymphadenopathy:    He has no cervical adenopathy.  Neurological: He is alert and oriented to person, place, and time. He has normal reflexes. No cranial nerve deficit. He exhibits normal muscle tone. Coordination normal.  Skin: Skin is warm and dry. No rash noted.  Psychiatric: He has a normal mood and affect. His behavior is normal. Judgment and thought content normal.     Lab Results  Component Value Date   WBC 8.3 06/14/2014   HGB 13.1 06/14/2014   HCT 39.0 06/14/2014   PLT 194.0 06/14/2014   GLUCOSE 111* 01/02/2015   CHOL 96 01/02/2015   TRIG 178.0* 01/02/2015   HDL 26.10* 01/02/2015   LDLDIRECT 50.1 04/28/2011   LDLCALC 34 01/02/2015   ALT 15 01/02/2015   AST 14 01/02/2015   NA 139 01/02/2015   K 4.3 01/02/2015   CL 106 01/02/2015   CREATININE 1.03 01/02/2015   BUN 19 01/02/2015   CO2 29 01/02/2015   TSH 2.34 01/02/2015   PSA 2.17 07/29/2013   INR 0.9 RATIO 03/24/2007   HGBA1C 6.2 02/13/2010        Assessment & Plan:

## 2015-01-03 NOTE — Assessment & Plan Note (Signed)
Chronic On Ramipril, Losartan, Coreg per Cardiology

## 2015-01-03 NOTE — Assessment & Plan Note (Signed)
Chronic On Ramipril, Losartan, Lipitor, Coreg, ASA per Cardiology NTG prn

## 2015-01-03 NOTE — Progress Notes (Signed)
Pre visit review using our clinic review tool, if applicable. No additional management support is needed unless otherwise documented below in the visit note. 

## 2015-01-07 ENCOUNTER — Other Ambulatory Visit: Payer: Self-pay | Admitting: Internal Medicine

## 2015-01-08 ENCOUNTER — Ambulatory Visit (INDEPENDENT_AMBULATORY_CARE_PROVIDER_SITE_OTHER): Payer: Medicare Other | Admitting: Cardiology

## 2015-01-08 ENCOUNTER — Encounter: Payer: Self-pay | Admitting: Cardiology

## 2015-01-08 VITALS — BP 138/74 | HR 77 | Ht 67.0 in | Wt 167.2 lb

## 2015-01-08 DIAGNOSIS — Z95810 Presence of automatic (implantable) cardiac defibrillator: Secondary | ICD-10-CM | POA: Diagnosis not present

## 2015-01-08 DIAGNOSIS — I255 Ischemic cardiomyopathy: Secondary | ICD-10-CM

## 2015-01-08 DIAGNOSIS — I25810 Atherosclerosis of coronary artery bypass graft(s) without angina pectoris: Secondary | ICD-10-CM | POA: Diagnosis not present

## 2015-01-08 DIAGNOSIS — I5022 Chronic systolic (congestive) heart failure: Secondary | ICD-10-CM

## 2015-01-08 NOTE — Assessment & Plan Note (Signed)
Falla status is stable. No change in therapy.

## 2015-01-08 NOTE — Progress Notes (Signed)
Cardiology Office Note   Date:  01/08/2015   ID:  [name] Curia., DOB Mar 30, 1929, MRN [id number]  PCP:  [name] Kehr, MD  Cardiologist:  Dola Argyle, MD   Chief Complaint  Patient presents with  . Appointment    Follow-up coronary artery disease      History of Present Illness: [name] Heikes. is a 81 y.o. male who presents today to follow-up coronary disease and ischemic cardiomyopathy and a history of hypertension. He did have an episode of syncope in the past that was related to hypotension. More recently his blood pressure has been elevated. He was hesitant to have his ramapril dose increased because he associated this with his syncopal episode in the past. Losartan was added to his medications. He is getting excellent control and I have left him on both. He feels good. There is no chest pain. He is fully active. He has an ICD in place that is followed carefully by electrophysiology.    Past Medical History  Diagnosis Date  . History of colon polyps   . Hyperlipidemia   . Hypertension   . 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 S/P CABG (coronary artery bypass graft) 1992  . ICD (implantable cardiac defibrillator) in place 03/2007    July, 2008  . Chronotropic incompetence     treated w/pacemaker, ICD 7.2008. This was placed after CPX done post 2008 cath. CPX showed only chronotropic incompetence  . Arrhythmia   . CHF (congestive heart failure)   . Syncopal episodes     relative hypotension  . Mitral regurgitation     mild  . Numbness and tingling of left arm and leg     improved after tx w/prednisone  . Hx of CABG     1992  . Grief reaction     Patient was a son in weight in 2001  . Arthritis   . Blood transfusion 1969  . Myocardial infarction   . Ejection fraction     .     Past Surgical History  Procedure Laterality Date  . Coronary artery bypass graft  1992  . Cardiac catheterization    . Cardiac defibrillator placement  2008  . Leg surgery  1942    Fx leg repair with artificial bone  . Cataract extraction  12/14 & 1/15    right eye in December and Left eye in January  . Implantable cardioverter defibrillator (icd) generator change N/A 06/21/2014    Procedure: ICD GENERATOR CHANGE;  Surgeon: Deboraha Sprang, MD;  Location: [facility] CATH LAB;  Service: Cardiovascular;  Laterality: N/A;    Patient Active Problem List   Diagnosis Date Noted  . Hx of CABG     Priority: High  . Chronotropic incompetence     Priority: High  . Automatic implantable cardioverter-defibrillator in situ 03/16/2007    Priority: High  . Chronic systolic CHF (congestive heart failure) 11/18/2013  . Ejection fraction   . CAD (coronary artery disease)   . Well adult exam 07/13/2012  . Chronic renal insufficiency, stage III (moderate) 07/21/2011  . Ischemic cardiomyopathy   . Syncopal episodes   . Mitral regurgitation   . Numbness and tingling of left arm and leg   . PARESTHESIA 10/22/2009  . TOBACCO USE, QUIT 10/22/2009  . SEBACEOUS  CYST 08/27/2009  . Anemia of other chronic disease 02/18/2008  . HYPERLIPIDEMIA 05/03/2007  . Essential hypertension 05/03/2007  . COLONIC POLYPS, HX OF 05/03/2007      Current Outpatient Prescriptions  Medication Sig Dispense Refill  . aspirin 81 MG tablet Take 1 tablet (81 mg total) by mouth daily. 30 tablet 11  . atorvastatin (LIPITOR) 40 MG tablet Take 40 mg by mouth daily.    . carvedilol (COREG) 25 MG tablet TAKE 1 TABLET TWICE A DAY (Patient taking differently: TAKE 1 TABLET BY MOUTH TWICE A DAY) 180 tablet 2  . Cholecalciferol 1000 UNITS tablet Take 1,000 Units by mouth daily.      . digoxin (LANOXIN) 0.125 MG tablet Take 0.5 tablets (0.0625 mg total) by mouth daily. 90 tablet 1  . losartan (COZAAR) 100 MG tablet Take 1 tablet (100 mg  total) by mouth daily. 90 tablet 3  . Multiple Vitamins-Minerals (PRESERVISION AREDS 2) CAPS Take 1 capsule by mouth 2 (two) times daily.    . nitroGLYCERIN (NITROLINGUAL) 0.4 MG/SPRAY spray Place 1 spray under the tongue every 5 (five) minutes x 3 doses as needed for chest pain.     . ramipril (ALTACE) 2.5 MG capsule TAKE 1 CAPSULE DAILY 90 capsule 0   No current facility-administered medications for this visit.    Allergies:   Morphine and Ramipril    Social History:  The patient  reports that he has quit smoking. His smoking use included Cigars. He has never used smokeless tobacco. He reports that he does not drink alcohol or use illicit drugs.   Family History:  The patient's family history includes Colon cancer in his other; Hypertension in his father, mother, and other.    ROS:  Please see the history of present illness.     Patient denies fever, chills, headache, sweats, rash, change in vision, change in hearing, chest pain, cough, nausea or vomiting, urinary symptoms. All other systems are reviewed and are negative.   PHYSICAL EXAM: VS:  BP 138/74 mmHg  Pulse 77  Ht 5\' 7"  (1.702 m)  Wt 167 lb 3.2 oz (75.841 kg)  BMI 26.18 kg/m2  SpO2 96% , Patient is stable. He looks good. He is oriented to person time and place. Affect is normal. Head is atraumatic. Sclera and conjunctiva are normal. There is no jugular venous distention. Lungs are clear. Respiratory effort is nonlabored. Cardiac exam reveals S1 and S2. Abdomen is soft and there is no peripheral edema. There are no musculoskeletal deformities. There are no skin rashes. He has a tattoo on his left arm. Neurologic is grossly intact.  EKG:   EKG is not done today.   Recent Labs: 06/14/2014: Hemoglobin 13.1; Platelets 194.0 01/02/2015: ALT 15; BUN 19; Creatinine 1.03; Potassium 4.3; Sodium 139; TSH 2.34    Lipid Panel    Component Value Date/Time   CHOL 96 01/02/2015 1008   TRIG 178.0* 01/02/2015 1008   HDL 26.10*  01/02/2015 1008   CHOLHDL 4 01/02/2015 1008   VLDL 35.6 01/02/2015 1008   LDLCALC 34 01/02/2015 1008   LDLDIRECT 50.1 04/28/2011 0827      Wt Readings from Last 3 Encounters:  01/08/15 167 lb 3.2 oz (75.841 kg)  01/03/15 169 lb (76.658 kg)  11/13/14 169 lb 9.6 oz (76.93 kg)      Current medicines are reviewed  The patient understands his medications.     ASSESSMENT AND PLAN:

## 2015-01-08 NOTE — Patient Instructions (Signed)
**Note De-identified Bina Veenstra Obfuscation** Medication Instructions:  None  Labwork: None  Testing/Procedures: None  Follow-Up: Your physician wants you to follow-up in: 6 months. You will receive a reminder letter in the mail two months in advance. If you don't receive a letter, please call our office to schedule the follow-up appointment.      

## 2015-01-08 NOTE — Assessment & Plan Note (Signed)
The patient is on appropriate medications. He is not on spironolactone because of prior renal insufficiency that occurred with this drug. No change in therapy.

## 2015-01-08 NOTE — Assessment & Plan Note (Signed)
Coronary disease is stable. He had a nuclear study in 2010. There was no scar or ischemia. He does not need exercise testing at this time. He is on appropriate medications.

## 2015-01-08 NOTE — Assessment & Plan Note (Signed)
He has an ICD in place for prophylaxis. This is followed by electrophysiology and is stable.

## 2015-01-08 NOTE — Addendum Note (Signed)
Addended by: Dennie Fetters on: 01/08/2015 10:56 AM   Modules accepted: Orders, Level of Service

## 2015-01-09 ENCOUNTER — Encounter: Payer: Self-pay | Admitting: Internal Medicine

## 2015-01-09 ENCOUNTER — Other Ambulatory Visit: Payer: Self-pay

## 2015-01-09 MED ORDER — DIGOXIN 125 MCG PO TABS: ORAL_TABLET | ORAL | Status: DC

## 2015-01-23 ENCOUNTER — Encounter: Payer: Self-pay | Admitting: Cardiology

## 2015-02-10 ENCOUNTER — Other Ambulatory Visit: Payer: Self-pay | Admitting: Cardiology

## 2015-03-16 ENCOUNTER — Other Ambulatory Visit: Payer: Self-pay | Admitting: Cardiology

## 2015-03-28 ENCOUNTER — Encounter: Payer: Self-pay | Admitting: Internal Medicine

## 2015-03-28 ENCOUNTER — Ambulatory Visit (INDEPENDENT_AMBULATORY_CARE_PROVIDER_SITE_OTHER): Payer: Medicare Other | Admitting: *Deleted

## 2015-03-28 DIAGNOSIS — I255 Ischemic cardiomyopathy: Secondary | ICD-10-CM | POA: Diagnosis not present

## 2015-03-28 DIAGNOSIS — I5022 Chronic systolic (congestive) heart failure: Secondary | ICD-10-CM | POA: Diagnosis not present

## 2015-03-28 NOTE — Progress Notes (Signed)
Remote ICD transmission.   

## 2015-04-03 LAB — CUP PACEART REMOTE DEVICE CHECK
Battery Remaining Longevity: 76 mo
Battery Remaining Percentage: 91 %
Brady Statistic AP VP Percent: 5.9 %
Brady Statistic AP VS Percent: 87 %
Brady Statistic RA Percent Paced: 91 %
Brady Statistic RV Percent Paced: 5.9 %
Date Time Interrogation Session: 20160713060018
HIGH POWER IMPEDANCE MEASURED VALUE: 77 Ohm
HighPow Impedance: 77 Ohm
Lead Channel Impedance Value: 360 Ohm
Lead Channel Pacing Threshold Pulse Width: 0.5 ms
Lead Channel Sensing Intrinsic Amplitude: 2.4 mV
Lead Channel Setting Pacing Amplitude: 1.5 V
MDC IDC MSMT BATTERY VOLTAGE: 3.1 V
MDC IDC MSMT LEADCHNL RA PACING THRESHOLD AMPLITUDE: 0.5 V
MDC IDC MSMT LEADCHNL RV IMPEDANCE VALUE: 390 Ohm
MDC IDC MSMT LEADCHNL RV SENSING INTR AMPL: 12 mV
MDC IDC SET LEADCHNL RV PACING AMPLITUDE: 2.5 V
MDC IDC SET LEADCHNL RV PACING PULSEWIDTH: 0.5 ms
MDC IDC SET LEADCHNL RV SENSING SENSITIVITY: 0.5 mV
MDC IDC STAT BRADY AS VP PERCENT: 1 %
MDC IDC STAT BRADY AS VS PERCENT: 6.8 %
Pulse Gen Serial Number: 7225183
Zone Setting Detection Interval: 250 ms
Zone Setting Detection Interval: 300 ms
Zone Setting Detection Interval: 340 ms

## 2015-05-03 ENCOUNTER — Other Ambulatory Visit: Payer: Self-pay | Admitting: Internal Medicine

## 2015-05-04 ENCOUNTER — Other Ambulatory Visit (INDEPENDENT_AMBULATORY_CARE_PROVIDER_SITE_OTHER): Payer: Medicare Other

## 2015-05-04 ENCOUNTER — Ambulatory Visit (INDEPENDENT_AMBULATORY_CARE_PROVIDER_SITE_OTHER): Payer: Medicare Other | Admitting: Internal Medicine

## 2015-05-04 ENCOUNTER — Encounter: Payer: Self-pay | Admitting: Internal Medicine

## 2015-05-04 VITALS — BP 140/72 | HR 70 | Wt 167.0 lb

## 2015-05-04 DIAGNOSIS — I10 Essential (primary) hypertension: Secondary | ICD-10-CM

## 2015-05-04 DIAGNOSIS — D638 Anemia in other chronic diseases classified elsewhere: Secondary | ICD-10-CM | POA: Diagnosis not present

## 2015-05-04 DIAGNOSIS — E785 Hyperlipidemia, unspecified: Secondary | ICD-10-CM | POA: Diagnosis not present

## 2015-05-04 DIAGNOSIS — N189 Chronic kidney disease, unspecified: Secondary | ICD-10-CM

## 2015-05-04 DIAGNOSIS — N2889 Other specified disorders of kidney and ureter: Secondary | ICD-10-CM

## 2015-05-04 DIAGNOSIS — N183 Chronic kidney disease, stage 3 unspecified: Secondary | ICD-10-CM

## 2015-05-04 DIAGNOSIS — M79645 Pain in left finger(s): Secondary | ICD-10-CM

## 2015-05-04 DIAGNOSIS — M79646 Pain in unspecified finger(s): Secondary | ICD-10-CM | POA: Insufficient documentation

## 2015-05-04 LAB — CBC WITH DIFFERENTIAL/PLATELET
BASOS ABS: 0 10*3/uL (ref 0.0–0.1)
Basophils Relative: 0.5 % (ref 0.0–3.0)
EOS ABS: 0.2 10*3/uL (ref 0.0–0.7)
Eosinophils Relative: 3 % (ref 0.0–5.0)
HCT: 40.7 % (ref 39.0–52.0)
HEMOGLOBIN: 13.5 g/dL (ref 13.0–17.0)
LYMPHS ABS: 1.2 10*3/uL (ref 0.7–4.0)
Lymphocytes Relative: 14.6 % (ref 12.0–46.0)
MCHC: 33.1 g/dL (ref 30.0–36.0)
MCV: 87 fl (ref 78.0–100.0)
MONO ABS: 0.6 10*3/uL (ref 0.1–1.0)
Monocytes Relative: 7.9 % (ref 3.0–12.0)
NEUTROS PCT: 74 % (ref 43.0–77.0)
Neutro Abs: 6 10*3/uL (ref 1.4–7.7)
Platelets: 211 10*3/uL (ref 150.0–400.0)
RBC: 4.68 Mil/uL (ref 4.22–5.81)
RDW: 15.5 % (ref 11.5–15.5)
WBC: 8.1 10*3/uL (ref 4.0–10.5)

## 2015-05-04 LAB — BASIC METABOLIC PANEL
BUN: 16 mg/dL (ref 6–23)
CALCIUM: 9.6 mg/dL (ref 8.4–10.5)
CO2: 31 mEq/L (ref 19–32)
CREATININE: 1.09 mg/dL (ref 0.40–1.50)
Chloride: 102 mEq/L (ref 96–112)
GFR: 68.56 mL/min (ref >60.00)
Glucose, Bld: 90 mg/dL (ref 70–99)
POTASSIUM: 4.5 meq/L (ref 3.5–5.1)
Sodium: 139 mEq/L (ref 135–145)

## 2015-05-04 LAB — HEPATIC FUNCTION PANEL
ALT: 16 U/L (ref 0–53)
AST: 16 U/L (ref 0–37)
Albumin: 4.3 g/dL (ref 3.5–5.2)
Alkaline Phosphatase: 115 U/L (ref 39–117)
BILIRUBIN DIRECT: 0.2 mg/dL (ref 0.0–0.3)
BILIRUBIN TOTAL: 0.9 mg/dL (ref 0.2–1.2)
Total Protein: 7.2 g/dL (ref 6.0–8.3)

## 2015-05-04 LAB — TSH: TSH: 2.94 u[IU]/mL (ref 0.35–4.50)

## 2015-05-04 MED ORDER — METHYLPREDNISOLONE ACETATE 40 MG/ML IJ SUSP
10.0000 mg | Freq: Once | INTRAMUSCULAR | Status: AC
  Administered 2015-05-04: 10 mg via INTRA_ARTICULAR

## 2015-05-04 NOTE — Assessment & Plan Note (Signed)
Lipitor 

## 2015-05-04 NOTE — Assessment & Plan Note (Signed)
8/16 L base Will inject

## 2015-05-04 NOTE — Progress Notes (Signed)
Subjective:  Patient ID: [name] Curia., male    DOB: 16-Sep-1928  Age: 81 y.o. MRN: [id number]  CC: No chief complaint on file.   HPI [name] Eugene. presents for HTN, CAD, dyslipidemia f/u. /o L thumb pain x weeks  Outpatient Prescriptions Prior to Visit  Medication Sig Dispense Refill  . aspirin 81 MG tablet Take 1 tablet (81 mg total) by mouth daily. 30 tablet 11  . atorvastatin (LIPITOR) 40 MG tablet Take 40 mg by mouth daily.    . carvedilol (COREG) 25 MG tablet TAKE 1 TABLET TWICE A DAY 180 tablet 1  . Cholecalciferol 1000 UNITS tablet Take 1,000 Units by mouth daily.      . digoxin (LANOXIN) 0.125 MG tablet TAKE ONE-HALF (1/2) TABLET (0.0625 MG TOTAL) DAILY 90 tablet 0  . losartan (COZAAR) 100 MG tablet Take 1 tablet (100 mg total) by mouth daily. 90 tablet 3  . Multiple Vitamins-Minerals (PRESERVISION AREDS 2) CAPS Take 1 capsule by mouth 2 (two) times daily.    . nitroGLYCERIN (NITROLINGUAL) 0.4 MG/SPRAY spray Place 1 spray under the tongue every 5 (five) minutes x 3 doses as needed for chest pain.     . ramipril (ALTACE) 2.5 MG capsule TAKE 1 CAPSULE DAILY 90 capsule 3   No facility-administered medications prior to visit.    ROS Review of Systems  Constitutional: Negative for appetite change, fatigue and unexpected weight change.  HENT: Negative for congestion, nosebleeds, sneezing, sore throat and trouble swallowing.   Eyes: Negative for itching and visual disturbance.  Respiratory: Negative for cough.   Cardiovascular: Negative for chest pain, palpitations and leg swelling.  Gastrointestinal: Negative for nausea, diarrhea, blood in stool and abdominal distention.  Genitourinary: Negative for frequency and hematuria.  Musculoskeletal: Positive for arthralgias. Negative for back pain, joint swelling, gait problem and neck pain.  Skin: Negative for rash.  Neurological: Negative for dizziness, tremors, speech difficulty and weakness.  Psychiatric/Behavioral:  Negative for suicidal ideas, sleep disturbance, dysphoric mood and agitation. The patient is not nervous/anxious.     Objective:  BP 140/72 mmHg  Pulse 70  Wt 167 lb (75.751 kg)  SpO2 95%  BP Readings from Last 3 Encounters:  05/04/15 140/72  01/08/15 138/74  01/03/15 139/76    Wt Readings from Last 3 Encounters:  05/04/15 167 lb (75.751 kg)  01/08/15 167 lb 3.2 oz (75.841 kg)  01/03/15 169 lb (76.658 kg)    Physical Exam  Constitutional: He is oriented to person, place, and time. He appears well-developed. No distress.  NAD  HENT:  Mouth/Throat: Oropharynx is clear and moist.  Eyes: Conjunctivae are normal. Pupils are equal, round, and reactive to light.  Neck: Normal range of motion. No JVD present. No thyromegaly present.  Cardiovascular: Normal rate, regular rhythm, normal heart sounds and intact distal pulses.  Exam reveals no gallop and no friction rub.   No murmur heard. Pulmonary/Chest: Effort normal and breath sounds normal. No respiratory distress. He has no wheezes. He has no rales. He exhibits no tenderness.  Abdominal: Soft. Bowel sounds are normal. He exhibits no distension and no mass. There is no tenderness. There is no rebound and no guarding.  Musculoskeletal: Normal range of motion. He exhibits no edema or tenderness.  Lymphadenopathy:    He has no cervical adenopathy.  Neurological: He is alert and oriented to person, place, and time. He has normal reflexes. No cranial nerve deficit. He exhibits normal muscle tone. He displays a negative Romberg  sign. Coordination and gait normal.  Skin: Skin is warm and dry. No rash noted.  Psychiatric: He has a normal mood and affect. His behavior is normal. Judgment and thought content normal.  L palmar thumb base is tender  Lab Results  Component Value Date   WBC 8.3 06/14/2014   HGB 13.1 06/14/2014   HCT 39.0 06/14/2014   PLT 194.0 06/14/2014   GLUCOSE 111* 01/02/2015   CHOL 96 01/02/2015   TRIG 178.0*  01/02/2015   HDL 26.10* 01/02/2015   LDLDIRECT 50.1 04/28/2011   LDLCALC 34 01/02/2015   ALT 15 01/02/2015   AST 14 01/02/2015   NA 139 01/02/2015   K 4.3 01/02/2015   CL 106 01/02/2015   CREATININE 1.03 01/02/2015   BUN 19 01/02/2015   CO2 29 01/02/2015   TSH 2.34 01/02/2015   PSA 2.17 07/29/2013   INR 0.9 RATIO 03/24/2007   HGBA1C 6.2 02/13/2010    Procedure Note :   L thumb Injection:   Indication : OA   Risks including unsuccessful procedure , bleeding, infection, bruising, skin atrophy and others were explained to the patient in detail as well as the benefits. Informed consent was obtained and signed.   Tthe patient was placed in a comfortable position. L thumb joint was marked and  the skin was prepped with Betadine and alcohol. 1 inch 25-gauge needle was used. The needle was advanced  into the skin down to tendon. It  was injected with 0.5 mL of 2% lidocaine and 10 mg of Depo-Medrol in a usual fashion.  Band-Aids applied.   Tolerated well. Complications: None. Good pain relief following the procedure.  No results found.  Assessment & Plan:   There are no diagnoses linked to this encounter. I am having [name] [name] maintain his nitroGLYCERIN, Cholecalciferol, aspirin, atorvastatin, PRESERVISION AREDS 2, losartan, digoxin, carvedilol, and ramipril.  No orders of the defined types were placed in this encounter.     Follow-up: No Follow-up on file.  Walker Kehr, MD

## 2015-05-04 NOTE — Assessment & Plan Note (Signed)
On Ramipril, Losartan, Coreg per Cardiology

## 2015-05-04 NOTE — Progress Notes (Signed)
Pre visit review using our clinic review tool, if applicable. No additional management support is needed unless otherwise documented below in the visit note. 

## 2015-05-04 NOTE — Assessment & Plan Note (Addendum)
Labs Losartan, Altace per cardiology

## 2015-05-04 NOTE — Assessment & Plan Note (Signed)
CBC

## 2015-05-05 LAB — DIGOXIN LEVEL: Digoxin Level: <0.5 ug/L — ABNORMAL LOW (ref 0.8–2.0)

## 2015-05-10 DIAGNOSIS — Z961 Presence of intraocular lens: Secondary | ICD-10-CM | POA: Diagnosis not present

## 2015-05-10 DIAGNOSIS — H43811 Vitreous degeneration, right eye: Secondary | ICD-10-CM | POA: Diagnosis not present

## 2015-05-11 ENCOUNTER — Encounter: Payer: Self-pay | Admitting: Internal Medicine

## 2015-06-25 ENCOUNTER — Encounter: Payer: Medicare Other | Admitting: Internal Medicine

## 2015-07-06 ENCOUNTER — Other Ambulatory Visit: Payer: Self-pay | Admitting: Cardiology

## 2015-07-11 ENCOUNTER — Ambulatory Visit: Payer: Medicare Other | Admitting: Cardiovascular Disease

## 2015-07-17 ENCOUNTER — Encounter: Payer: Self-pay | Admitting: Cardiovascular Disease

## 2015-07-17 ENCOUNTER — Ambulatory Visit (INDEPENDENT_AMBULATORY_CARE_PROVIDER_SITE_OTHER): Payer: Medicare Other | Admitting: Cardiovascular Disease

## 2015-07-17 VITALS — BP 125/70 | HR 70 | Ht 67.0 in | Wt 167.8 lb

## 2015-07-17 DIAGNOSIS — E785 Hyperlipidemia, unspecified: Secondary | ICD-10-CM

## 2015-07-17 DIAGNOSIS — I5022 Chronic systolic (congestive) heart failure: Secondary | ICD-10-CM

## 2015-07-17 DIAGNOSIS — I2510 Atherosclerotic heart disease of native coronary artery without angina pectoris: Secondary | ICD-10-CM | POA: Diagnosis not present

## 2015-07-17 LAB — HEPATIC FUNCTION PANEL
ALT: 17 U/L (ref 9–46)
AST: 15 U/L (ref 10–35)
Albumin: 3.8 g/dL (ref 3.6–5.1)
Alkaline Phosphatase: 103 U/L (ref 40–115)
BILIRUBIN DIRECT: 0.1 mg/dL (ref <=0.2)
BILIRUBIN TOTAL: 0.7 mg/dL (ref 0.2–1.2)
Indirect Bilirubin: 0.6 mg/dL (ref 0.2–1.2)
Total Protein: 6.6 g/dL (ref 6.1–8.1)

## 2015-07-17 LAB — BASIC METABOLIC PANEL
BUN: 15 mg/dL (ref 7–25)
CALCIUM: 8.8 mg/dL (ref 8.6–10.3)
CO2: 25 mmol/L (ref 20–31)
Chloride: 104 mmol/L (ref 98–110)
Creat: 1.01 mg/dL (ref 0.70–1.11)
Glucose, Bld: 127 mg/dL — ABNORMAL HIGH (ref 65–99)
POTASSIUM: 4 mmol/L (ref 3.5–5.3)
SODIUM: 139 mmol/L (ref 135–146)

## 2015-07-17 LAB — LIPID PANEL
CHOL/HDL RATIO: 3.4 ratio (ref <=5.0)
CHOLESTEROL: 86 mg/dL — AB (ref 125–200)
HDL: 25 mg/dL — AB (ref >=40)
LDL Cholesterol: 32 mg/dL (ref <130)
Triglycerides: 145 mg/dL (ref <150)
VLDL: 29 mg/dL (ref <30)

## 2015-07-17 NOTE — Progress Notes (Signed)
Cardiology Office Note   Date:  07/17/2015   ID:  [name], DOB [birth year], MRN [id number]  PCP:  Walker Kehr, MD  Cardiologist:  Thayer Headings, MD   Problem list 1. Coronary artery disease-status post coronary artery bypass grafting 2. Hyperlipidemia 3. Chronic systolic congestive heart failure - EF 35-40% 4. ICD -    No chief complaint on file.     History of Present Illness: Lauro Manlove. is a 81 y.o. male who presents today to follow-up coronary disease and ischemic cardiomyopathy and a history of hypertension. He did have an episode of syncope in the past that was related to hypotension. More recently his blood pressure has been elevated. He was hesitant to have his ramapril dose increased because he associated this with his syncopal episode in the past. Losartan was added to his medications. He is getting excellent control and I have left him on both. He feels good. There is no chest pain. He is fully active. He has an ICD in place that is followed carefully by electrophysiology.  Retired [employer] ( [place] )   Nov. 1, 2016:  Doing well.  No CP or dyspnea. 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   Past Medical History  Diagnosis Date  . History of colon polyps   . Hyperlipidemia   . Hypertension   . 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 S/P CABG (coronary artery bypass graft) 1992  . ICD (implantable cardiac defibrillator) in place 03/2007    July, 2008  . Chronotropic incompetence     treated w/pacemaker, ICD 7.2008. This was placed after CPX done post 2008 cath. CPX showed only chronotropic incompetence  . Arrhythmia   . CHF (congestive heart failure) ([place])   . Syncopal episodes     relative hypotension  . Mitral regurgitation     mild  .  Numbness and tingling of left arm and leg     improved after tx w/prednisone  . Hx of CABG     1992  . Grief reaction   . Arthritis   . Blood transfusion 1969  . Myocardial infarction ([place])   . Ejection fraction     .    Past Surgical History  Procedure Laterality Date  . Coronary artery bypass graft  1992  . Cardiac catheterization    . Cardiac defibrillator placement  2008  . Leg surgery  1942    Fx leg repair with artificial bone  . Cataract extraction  12/14 & 1/15    right eye in December and Left eye in January  . Implantable cardioverter defibrillator (icd) generator change N/A 06/21/2014    Procedure: ICD GENERATOR CHANGE;  Surgeon: Deboraha Sprang, MD;  Location: [facility] CATH LAB;  Service: Cardiovascular;  Laterality: N/A;    Patient Active Problem List   Diagnosis Date Noted  . Thumb pain 05/04/2015  . Chronic systolic CHF (congestive heart failure) ([place]) 11/18/2013  . Ejection fraction   . CAD (coronary artery disease)   . Well adult exam 07/13/2012  . Chronic renal insufficiency, stage III (moderate) 07/21/2011  . Ischemic cardiomyopathy   . Hx of CABG   . Chronotropic incompetence   . Syncopal episodes   . Mitral regurgitation   .  Numbness and tingling of left arm and leg   . PARESTHESIA 10/22/2009  . TOBACCO USE, QUIT 10/22/2009  . SEBACEOUS CYST 08/27/2009  . Anemia, chronic disease 02/18/2008  . Dyslipidemia 05/03/2007  . Hypertensive heart disease 05/03/2007  . COLONIC POLYPS, HX OF 05/03/2007  . Automatic implantable cardioverter-defibrillator in situ 03/16/2007      Current Outpatient Prescriptions  Medication Sig Dispense Refill  . aspirin 81 MG tablet Take 1 tablet (81 mg total) by mouth daily. 30 tablet 11  . atorvastatin (LIPITOR) 40 MG tablet Take 40 mg by mouth daily.    . carvedilol (COREG) 25 MG tablet TAKE 1 TABLET TWICE A DAY (Patient taking differently: TAKE 1 TABLET BY MOUTH TWICE A DAY) 180 tablet 1  . Cholecalciferol 1000 UNITS tablet  Take 1,000 Units by mouth daily.      . digoxin (LANOXIN) 0.125 MG tablet TAKE ONE-HALF (1/2) TABLET (0.0625 MG TOTAL) DAILY 45 tablet 0  . losartan (COZAAR) 100 MG tablet Take 1 tablet (100 mg total) by mouth daily. 90 tablet 3  . Multiple Vitamins-Minerals (PRESERVISION AREDS 2) CAPS Take 1 capsule by mouth 2 (two) times daily.    . nitroGLYCERIN (NITROLINGUAL) 0.4 MG/SPRAY spray Place 1 spray under the tongue every 5 (five) minutes x 3 doses as needed for chest pain.     . ramipril (ALTACE) 2.5 MG capsule TAKE 1 CAPSULE DAILY (Patient taking differently: TAKE 1 CAPSULE BY MOUTH  DAILY) 90 capsule 3   No current facility-administered medications for this visit.    Allergies:   Morphine and Ramipril    Social History:  The patient  reports that he has quit smoking. His smoking use included Cigars. He has never used smokeless tobacco. He reports that he does not drink alcohol or use illicit drugs.   Family History:  The patient's family history includes Colon cancer in his other; Hypertension in his father, mother, and other.    ROS:  Please see the history of present illness.     Patient denies fever, chills, headache, sweats, rash, change in vision, change in hearing, chest pain, cough, nausea or vomiting, urinary symptoms. All other systems are reviewed and are negative.   PHYSICAL EXAM: VS:  BP 125/70 mmHg  Pulse 70  Ht 5\' 7"  (1.702 m)  Wt 167 lb 12.8 oz (76.114 kg)  BMI 26.28 kg/m2  SpO2 96% , Patient is stable. He looks good. He is oriented to person time and place. Affect is normal. Head is atraumatic. Sclera and conjunctiva are normal. There is no jugular venous distention. Lungs are clear. Respiratory effort is nonlabored. Cardiac exam reveals S1 and S2. Abdomen is soft and there is no peripheral edema. There are no musculoskeletal deformities. There are no skin rashes.    Neurologic is grossly intact.  EKG:   EKG is not done today.   Recent Labs: 05/04/2015: ALT 16; BUN 16;  Creatinine, Ser 1.09; Hemoglobin 13.5; Platelets 211.0; Potassium 4.5; Sodium 139; TSH 2.94    Lipid Panel    Component Value Date/Time   CHOL 96 01/02/2015 1008   TRIG 178.0* 01/02/2015 1008   HDL 26.10* 01/02/2015 1008   CHOLHDL 4 01/02/2015 1008   VLDL 35.6 01/02/2015 1008   LDLCALC 34 01/02/2015 1008   LDLDIRECT 50.1 04/28/2011 0827      Wt Readings from Last 3 Encounters:  07/17/15 167 lb 12.8 oz (76.114 kg)  05/04/15 167 lb (75.751 kg)  01/08/15 167 lb 3.2 oz (75.841 kg)  Current medicines are reviewed  The patient understands his medications.     ASSESSMENT AND PLAN:  1. Coronary artery disease-status post coronary artery bypass grafting - no angina .  Continue current meds  2. Hyperlipidemia 3. Chronic systolic congestive heart failure - EF 35-40% 4. ICD -     Nahser, Wonda Cheng, MD  07/17/2015 10:14 AM    [facility],  [address] [place], [place]  [address] Pager [phone] Phone: [phone]; Fax: [phone]   [facility]  [address] [place], [place]  [address] [phone]   Fax [phone]

## 2015-07-17 NOTE — Patient Instructions (Signed)
Medication Instructions:  Your physician recommends that you continue on your current medications as directed. Please refer to the Current Medication list given to you today.   Labwork: TODAY - cholesterol, liver, basic metabolic panel  Your physician recommends that you return for lab work in: 6 months on the day of or a few days before your office visit with Dr. Nahser.  You will need to FAST for this appointment - nothing to eat or drink after midnight the night before except water.   Testing/Procedures: None Ordered   Follow-Up: Your physician wants you to follow-up in: 6 months with Dr. Nahser.  You will receive a reminder letter in the mail two months in advance. If you don't receive a letter, please call our office to schedule the follow-up appointment.   If you need a refill on your cardiac medications before your next appointment, please call your pharmacy.   Thank you for choosing [facility]! Idil Maslanka, RN [phone]   

## 2015-07-18 ENCOUNTER — Telehealth: Payer: Self-pay | Admitting: Cardiovascular Disease

## 2015-07-18 NOTE — Telephone Encounter (Signed)
Informed patient of results and verbal understanding expressed.  

## 2015-07-18 NOTE — Telephone Encounter (Signed)
New message     Returning a call to get lab results.  OK to leave results on ans machine.

## 2015-08-01 ENCOUNTER — Encounter: Payer: Self-pay | Admitting: Internal Medicine

## 2015-08-01 ENCOUNTER — Ambulatory Visit (INDEPENDENT_AMBULATORY_CARE_PROVIDER_SITE_OTHER): Payer: Medicare Other | Admitting: Internal Medicine

## 2015-08-01 VITALS — BP 132/64 | HR 69 | Ht 67.0 in | Wt 169.6 lb

## 2015-08-01 DIAGNOSIS — I255 Ischemic cardiomyopathy: Secondary | ICD-10-CM | POA: Diagnosis not present

## 2015-08-01 DIAGNOSIS — Z95810 Presence of automatic (implantable) cardiac defibrillator: Secondary | ICD-10-CM

## 2015-08-01 DIAGNOSIS — I5022 Chronic systolic (congestive) heart failure: Secondary | ICD-10-CM

## 2015-08-01 DIAGNOSIS — I4589 Other specified conduction disorders: Secondary | ICD-10-CM

## 2015-08-01 LAB — CUP PACEART INCLINIC DEVICE CHECK
Brady Statistic RA Percent Paced: 92 %
Brady Statistic RV Percent Paced: 5.7 %
Date Time Interrogation Session: 20161116165730
HIGH POWER IMPEDANCE MEASURED VALUE: 74.25 Ohm
Implantable Lead Implant Date: 20080711
Lead Channel Impedance Value: 387.5 Ohm
Lead Channel Pacing Threshold Amplitude: 0.75 V
Lead Channel Pacing Threshold Amplitude: 0.75 V
Lead Channel Pacing Threshold Amplitude: 0.75 V
Lead Channel Pacing Threshold Amplitude: 0.75 V
Lead Channel Pacing Threshold Pulse Width: 0.5 ms
Lead Channel Pacing Threshold Pulse Width: 0.5 ms
Lead Channel Sensing Intrinsic Amplitude: 12 mV
Lead Channel Sensing Intrinsic Amplitude: 2.3 mV
Lead Channel Setting Pacing Amplitude: 2.5 V
Lead Channel Setting Sensing Sensitivity: 0.5 mV
MDC IDC LEAD IMPLANT DT: 20080711
MDC IDC LEAD LOCATION: 753859
MDC IDC LEAD LOCATION: 753860
MDC IDC LEAD MODEL: 7122
MDC IDC MSMT BATTERY REMAINING LONGEVITY: 76.8
MDC IDC MSMT LEADCHNL RA PACING THRESHOLD PULSEWIDTH: 0.5 ms
MDC IDC MSMT LEADCHNL RV IMPEDANCE VALUE: 387.5 Ohm
MDC IDC MSMT LEADCHNL RV PACING THRESHOLD PULSEWIDTH: 0.5 ms
MDC IDC SET LEADCHNL RA PACING AMPLITUDE: 1.625
MDC IDC SET LEADCHNL RV PACING PULSEWIDTH: 0.5 ms
Pulse Gen Serial Number: 7225183

## 2015-08-01 MED ORDER — HYDROCHLOROTHIAZIDE 12.5 MG PO CAPS: 12.5000 mg | ORAL_CAPSULE | Freq: Every day | ORAL | Status: DC

## 2015-08-01 NOTE — Patient Instructions (Addendum)
Medication Instructions:  Your physician has recommended you make the following change in your medication: Start Hydrochlorothiazide 12.5 mg by mouth daily.      Labwork: Lab work to be done in 2 weeks at [facility].   You have a prescription for this.   Testing/Procedures: none  Follow-Up: Your physician wants you to follow-up in: 12 months.  You will receive a reminder letter in the mail two months in advance. If you don't receive a letter, please call our office to schedule the follow-up appointment.  Remote monitoring is used to monitor your Pacemaker of ICD from home. This monitoring reduces the number of office visits required to check your device to one time per year. It allows [place] to keep an eye on the functioning of your device to ensure it is working properly. You are scheduled for a device check from home on 10/31/15. You may send your transmission at any time that day. If you have a wireless device, the transmission will be sent automatically. After your physician reviews your transmission, you will receive a postcard with your next transmission date.    Any Other Special Instructions Will Be Listed Below (If Applicable).     If you need a refill on your cardiac medications before your next appointment, please call your pharmacy.

## 2015-08-01 NOTE — Progress Notes (Signed)
Patient Care Team: Cassandria Anger, MD as PCP - General Carlena Bjornstad, MD (Cardiology) Deboraha Sprang, MD (Cardiology)   HPI  [name] [name]. is a 81 y.o. male seen in followup for an ICD implanted for primary prevention in the setting of ischemic heart disease.  His device reached ERI and underwent generator replacement  10/15.   The patient denies chest pain, shortness of breath, nocturnal dyspnea, orthopnea or peripheral edema. There have been no palpitations, lightheadedness or syncope.    Echo 9/14 EF 35-40%     Past Medical History  Diagnosis Date  . History of colon polyps   . Hyperlipidemia   . Hypertension   . 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 S/P CABG (coronary artery bypass graft) 1992  . ICD (implantable cardiac defibrillator) in place 03/2007    July, 2008  . Chronotropic incompetence     treated w/pacemaker, ICD 7.2008. This was placed after CPX done post 2008 cath. CPX showed only chronotropic incompetence  . Arrhythmia   . CHF (congestive heart failure) ([place])   . Syncopal episodes     relative hypotension  . Mitral regurgitation     mild  . Numbness and tingling of left arm and leg     improved after tx w/prednisone  . Hx of CABG     1992  . Grief reaction   . Arthritis   . Blood transfusion 1969  . Myocardial infarction ([place])   . Ejection fraction     .    Past Surgical History  Procedure Laterality Date  . Coronary artery bypass graft  1992  . Cardiac catheterization    . Cardiac defibrillator placement  2008  . Leg surgery  1942    Fx leg repair with artificial bone  . Cataract extraction  12/14 & 1/15    right eye in December and Left eye in January  . Implantable cardioverter defibrillator (icd) generator change N/A 06/21/2014   Procedure: ICD GENERATOR CHANGE;  Surgeon: Deboraha Sprang, MD;  Location: [facility] CATH LAB;  Service: Cardiovascular;  Laterality: N/A;    Current Outpatient Prescriptions  Medication Sig Dispense Refill  . aspirin 81 MG tablet Take 1 tablet (81 mg total) by mouth daily. 30 tablet 11  . atorvastatin (LIPITOR) 40 MG tablet Take 40 mg by mouth daily.    . carvedilol (COREG) 25 MG tablet TAKE 1 TABLET TWICE A DAY (Patient taking differently: TAKE 1 TABLET BY MOUTH TWICE A DAY) 180 tablet 1  . Cholecalciferol 1000 UNITS tablet Take 1,000 Units by mouth daily.      . digoxin (LANOXIN) 0.125 MG tablet TAKE ONE-HALF (1/2) TABLET (0.0625 MG TOTAL) DAILY 45 tablet 0  . losartan (COZAAR) 100 MG tablet Take 1 tablet (100 mg total) by mouth daily. 90 tablet 3  . Multiple Vitamins-Minerals (PRESERVISION AREDS 2) CAPS Take 1 capsule by mouth 2 (two) times daily.    . nitroGLYCERIN (NITROLINGUAL) 0.4 MG/SPRAY spray Place 1 spray under the tongue every 5 (five) minutes x 3 doses as needed for chest pain.     . ramipril (ALTACE) 2.5 MG capsule TAKE 1 CAPSULE DAILY (Patient taking differently: TAKE 1 CAPSULE BY MOUTH  DAILY) 90 capsule 3   No current facility-administered medications for this  visit.    Allergies  Allergen Reactions  . Morphine     "increased white corpuscles"  . Ramipril Other (See Comments)    REACTION: cough if dose is over 2.5mg     Review of Systems negative except from HPI and PMH  Physical Exam BP 132/64 mmHg  Pulse 69  Ht 5\' 7"  (1.702 m)  Wt 169 lb 9.6 oz (76.93 kg)  BMI 26.56 kg/m2 Well developed and well nourished in no acute distress HENT normal E scleral and icterus clear Neck Supple JVP flat; carotids brisk and full Clear to ausculation Device pocket well healed; without hematoma or erythema.  There is no tethering   *Regular rate and rhythm, no murmurs gallops or rub Soft with active bowel sounds No clubbing cyanosis  1- 2+Edema Alert and oriented, grossly normal  motor and sensory function Skin Warm and Dry  ECG demonstrates atrial pacing at 70 Intervals 22/10/37 Axis left -37  Assessment and  Plan  Ischemic cardiomyopathy  Sinus Node dysfunction  Hypertension  Implantable Defibrillator  [employer] The patient's device was interrogated.  The information was reviewed. No changes were made in the programming.     HFrEF  Blood pressure is well-controlled. We will add hydrochlorothiazide for his edema. Hopefully at his prescription refill they can come via losartan HCT. We will check a metabolic profile in 2 weeks time kidney function and potassium levels were normal 07/17/15  Without symptoms of ischemia

## 2015-08-10 ENCOUNTER — Other Ambulatory Visit: Payer: Self-pay | Admitting: Cardiology

## 2015-08-15 ENCOUNTER — Other Ambulatory Visit (HOSPITAL_COMMUNITY)
Admission: RE | Admit: 2015-08-15 | Discharge: 2015-08-15 | Disposition: A | Discharge status: Home / Self Care | Payer: Medicare Other | Source: Ambulatory Visit | Attending: Internal Medicine | Admitting: Internal Medicine

## 2015-08-15 DIAGNOSIS — I5022 Chronic systolic (congestive) heart failure: Secondary | ICD-10-CM | POA: Insufficient documentation

## 2015-08-15 LAB — BASIC METABOLIC PANEL
Anion gap: 7 (ref 5–15)
BUN: 26 mg/dL — AB (ref 6–20)
CALCIUM: 9.3 mg/dL (ref 8.9–10.3)
CO2: 28 mmol/L (ref 22–32)
CREATININE: 1.2 mg/dL (ref 0.61–1.24)
Chloride: 103 mmol/L (ref 101–111)
GFR calc non Af Amer: 54 mL/min — ABNORMAL LOW (ref >60)
Glucose, Bld: 125 mg/dL — ABNORMAL HIGH (ref 65–99)
Potassium: 5 mmol/L (ref 3.5–5.1)
SODIUM: 138 mmol/L (ref 135–145)

## 2015-08-28 ENCOUNTER — Telehealth: Payer: Self-pay | Admitting: Internal Medicine

## 2015-08-28 NOTE — Telephone Encounter (Signed)
I spoke with the patient. 

## 2015-08-28 NOTE — Telephone Encounter (Signed)
New message      Calling to get lab results.  Please call between 1pm-4pm

## 2015-09-29 ENCOUNTER — Other Ambulatory Visit: Payer: Self-pay | Admitting: Cardiology

## 2015-10-02 ENCOUNTER — Other Ambulatory Visit: Payer: Self-pay | Admitting: Cardiovascular Disease

## 2015-10-19 ENCOUNTER — Other Ambulatory Visit: Payer: Self-pay | Admitting: Cardiology

## 2015-10-31 ENCOUNTER — Ambulatory Visit (INDEPENDENT_AMBULATORY_CARE_PROVIDER_SITE_OTHER): Payer: Medicare Other | Admitting: *Deleted

## 2015-10-31 DIAGNOSIS — Z95810 Presence of automatic (implantable) cardiac defibrillator: Secondary | ICD-10-CM | POA: Diagnosis not present

## 2015-10-31 DIAGNOSIS — I255 Ischemic cardiomyopathy: Secondary | ICD-10-CM | POA: Diagnosis not present

## 2015-10-31 NOTE — Progress Notes (Signed)
Remote ICD transmission.   

## 2015-11-27 LAB — CUP PACEART REMOTE DEVICE CHECK
Battery Remaining Longevity: 72 mo
Brady Statistic AP VP Percent: 5.1 %
Brady Statistic AP VS Percent: 90 %
Brady Statistic AS VP Percent: 1 %
Brady Statistic RA Percent Paced: 93 %
Implantable Lead Implant Date: 20080711
Implantable Lead Location: 753859
Implantable Lead Location: 753860
Implantable Lead Model: 7122
Lead Channel Pacing Threshold Pulse Width: 0.5 ms
Lead Channel Setting Pacing Amplitude: 2.5 V
MDC IDC LEAD IMPLANT DT: 20080711
MDC IDC MSMT BATTERY REMAINING PERCENTAGE: 85 %
MDC IDC MSMT BATTERY VOLTAGE: 2.99 V
MDC IDC MSMT LEADCHNL RA PACING THRESHOLD AMPLITUDE: 0.625 V
MDC IDC SESS DTM: 20170215070035
MDC IDC SET LEADCHNL RA PACING AMPLITUDE: 1.625
MDC IDC SET LEADCHNL RV PACING PULSEWIDTH: 0.5 ms
MDC IDC SET LEADCHNL RV SENSING SENSITIVITY: 0.5 mV
MDC IDC STAT BRADY AS VS PERCENT: 5.1 %
MDC IDC STAT BRADY RV PERCENT PACED: 5.1 %
Pulse Gen Serial Number: 7225183

## 2015-11-28 ENCOUNTER — Encounter: Payer: Self-pay | Admitting: Cardiology

## 2015-12-03 ENCOUNTER — Telehealth: Payer: Self-pay | Admitting: Nurse Practitioner

## 2015-12-03 NOTE — Telephone Encounter (Signed)
-----   Message from Thayer Headings, MD sent at 12/03/2015  1:06 PM EDT ----- Lets DC the Altace. Thanks for letting me know.   ----- Message -----    From: Emmaline Life, RN    Sent: 11/30/2015   2:21 PM      To: Thayer Headings, MD  Patient is on ramipril and losartan.Marland Kitchen...just wanted to verify that you are aware of this.  Thanks!

## 2015-12-03 NOTE — Telephone Encounter (Signed)
Spoke with patient and advised him to stop Ramipril (Altace) and continue losartan (Cozaar). I had patient to get his pill bottles and he verbalized identification of the correct medication and verbalized understanding and agreement.

## 2016-01-08 ENCOUNTER — Ambulatory Visit (INDEPENDENT_AMBULATORY_CARE_PROVIDER_SITE_OTHER): Payer: Medicare Other | Admitting: Cardiovascular Disease

## 2016-01-08 ENCOUNTER — Encounter: Payer: Self-pay | Admitting: Cardiovascular Disease

## 2016-01-08 VITALS — BP 110/62 | HR 72 | Ht 67.0 in | Wt 167.8 lb

## 2016-01-08 DIAGNOSIS — I5022 Chronic systolic (congestive) heart failure: Secondary | ICD-10-CM | POA: Diagnosis not present

## 2016-01-08 DIAGNOSIS — I25810 Atherosclerosis of coronary artery bypass graft(s) without angina pectoris: Secondary | ICD-10-CM

## 2016-01-08 NOTE — Progress Notes (Signed)
Cardiology Office Note   Date:  01/08/2016   ID:  [name] Curia., DOB 09/05/1929, MRN [id number]  PCP:  Walker Kehr, MD  Cardiologist:  Thayer Headings, MD   Problem list 1. Coronary artery disease-status post coronary artery bypass grafting 2. Hyperlipidemia 3. Chronic systolic congestive heart failure - EF 35-40% 4. ICD -    Chief Complaint  Patient presents with  . Follow-up    some tiredness      History of Present Illness: [name] [name]. is a 82 y.o. male who presents today to follow-up coronary disease and ischemic cardiomyopathy and a history of hypertension. He did have an episode of syncope in the past that was related to hypotension. More recently his blood pressure has been elevated. He was hesitant to have his ramapril dose increased because he associated this with his syncopal episode in the past. Losartan was added to his medications. He is getting excellent control and I have left him on both. He feels good. There is no chest pain. He is fully active. He has an ICD in place that is followed carefully by electrophysiology.  Retired [employer] ( [place] )   Nov. 1, 2016:  Doing well.  No CP or dyspnea. 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   January 08, 2016:  See for follow up for his CAD / CABG, chronic systolic CHF , HTN Doing well.    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   Stays active  Does not ride anymore.  Has some fatigue , no CP    Past Medical History  Diagnosis Date  . History of colon polyps   . Hyperlipidemia   . Hypertension   . 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 S/P CABG (coronary artery bypass graft) 1992  . ICD (implantable cardiac defibrillator) in place 03/2007    July, 2008  . Chronotropic incompetence    treated w/pacemaker, ICD 7.2008. This was placed after CPX done post 2008 cath. CPX showed only chronotropic incompetence  . Arrhythmia   . CHF (congestive heart failure) ([place])   . Syncopal episodes     relative hypotension  . Mitral regurgitation     mild  . Numbness and tingling of left arm and leg     improved after tx w/prednisone  . Hx of CABG     1992  . Grief reaction   . Arthritis   . Blood transfusion 1969  . Myocardial infarction ([place])   . Ejection fraction     .    Past Surgical History  Procedure Laterality Date  . Coronary artery bypass graft  1992  . Cardiac catheterization    . Cardiac defibrillator placement  2008  . Leg surgery  1942    Fx leg repair with artificial bone  . Cataract extraction  12/14 & 1/15    right eye in December and Left eye in January  . Implantable cardioverter defibrillator (icd) generator change N/A 06/21/2014    Procedure: ICD GENERATOR CHANGE;  Surgeon: Deboraha Sprang, MD;  Location: [facility] CATH LAB;  Service: Cardiovascular;  Laterality: N/A;    Patient Active Problem List   Diagnosis Date Noted  . Chronic systolic CHF (congestive heart failure) ([place])  11/18/2013    Priority: High  . CAD (coronary artery disease)     Priority: High  . Thumb pain 05/04/2015  . Ejection fraction   . Well adult exam 07/13/2012  . Chronic renal insufficiency, stage III (moderate) 07/21/2011  . Ischemic cardiomyopathy   . Hx of CABG   . Chronotropic incompetence   . Syncopal episodes   . Mitral regurgitation   . Numbness and tingling of left arm and leg   . PARESTHESIA 10/22/2009  . TOBACCO USE, QUIT 10/22/2009  . SEBACEOUS CYST 08/27/2009  . Anemia, chronic disease 02/18/2008  . Dyslipidemia 05/03/2007  . Hypertensive heart disease 05/03/2007  . COLONIC POLYPS, HX OF 05/03/2007  . Automatic implantable cardioverter-defibrillator in situ 03/16/2007      Current Outpatient Prescriptions  Medication Sig Dispense Refill  . aspirin 81 MG tablet  Take 1 tablet (81 mg total) by mouth daily. 30 tablet 11  . atorvastatin (LIPITOR) 40 MG tablet Take 1 tablet (40 mg total) by mouth daily. 90 tablet 2  . carvedilol (COREG) 25 MG tablet Take 1 tablet (25 mg total) by mouth 2 (two) times daily. 180 tablet 3  . Cholecalciferol 1000 UNITS tablet Take 1,000 Units by mouth daily.      . digoxin (LANOXIN) 0.125 MG tablet TAKE ONE-HALF TABLET (0.0625 MG) DAILY 45 tablet 2  . hydrochlorothiazide (MICROZIDE) 12.5 MG capsule Take 1 capsule (12.5 mg total) by mouth daily. 90 capsule 3  . losartan (COZAAR) 100 MG tablet Take 1 tablet (100 mg total) by mouth daily. 90 tablet 3  . Multiple Vitamins-Minerals (PRESERVISION AREDS 2) CAPS Take 1 capsule by mouth 2 (two) times daily.    . nitroGLYCERIN (NITROLINGUAL) 0.4 MG/SPRAY spray Place 1 spray under the tongue every 5 (five) minutes x 3 doses as needed for chest pain.     . ramipril (ALTACE) 2.5 MG capsule Take 1.25 mg by mouth daily.     No current facility-administered medications for this visit.    Allergies:   Morphine and Ramipril    Social History:  The patient  reports that he has quit smoking. His smoking use included Cigars. He has never used smokeless tobacco. He reports that he does not drink alcohol or use illicit drugs.   Family History:  The patient's family history includes Colon cancer in his other; Hypertension in his father, mother, and other.    ROS:  Please see the history of present illness.     Patient denies fever, chills, headache, sweats, rash, change in vision, change in hearing, chest pain, cough, nausea or vomiting, urinary symptoms. All other systems are reviewed and are negative.   PHYSICAL EXAM: VS:  BP 110/62 mmHg  Pulse 72  Ht 5\' 7"  (1.702 m)  Wt 167 lb 12.8 oz (76.114 kg)  BMI 26.28 kg/m2 , Patient is stable. He looks good. He is oriented to person time and place. Affect is normal. Head is atraumatic. Sclera and conjunctiva are normal. There is no jugular venous  distention.  Lungs are clear. Respiratory effort is nonlabored.  Cardiac exam reveals S1 and S2.  Abdomen is soft and there is no peripheral edema. There are no musculoskeletal deformities. There are no skin rashes.     Neurologic is grossly intact.  EKG:   EKG is not done today.   Recent Labs: 05/04/2015: Hemoglobin 13.5; Platelets 211.0; TSH 2.94 07/17/2015: ALT 17 08/15/2015: BUN 26*; Creatinine, Ser 1.20; Potassium 5.0; Sodium 138    Lipid Panel  Component Value Date/Time   CHOL 86* 07/17/2015 1023   TRIG 145 07/17/2015 1023   HDL 25* 07/17/2015 1023   CHOLHDL 3.4 07/17/2015 1023   VLDL 29 07/17/2015 1023   LDLCALC 32 07/17/2015 1023   LDLDIRECT 50.1 04/28/2011 0827      Wt Readings from Last 3 Encounters:  01/08/16 167 lb 12.8 oz (76.114 kg)  08/01/15 169 lb 9.6 oz (76.93 kg)  07/17/15 167 lb 12.8 oz (76.114 kg)      Current medicines are reviewed  The patient understands his medications.     ASSESSMENT AND PLAN:  1. Coronary artery disease-status post coronary artery bypass grafting - no angina .  Continue current meds  2. Hyperlipidemia - will check labs in 6 months , continue atorvastatin   3. Chronic systolic congestive heart failure - EF 35-40% - continue meds. On coreg and Losartan ( Ramipril has been taken off the list )   4. ICD -  Followed by Dr. Nita Sickle, Wonda Cheng, MD  01/08/2016 11:58 AM    [facility],  [place], [place]  [address] Pager [phone] Phone: [phone]; Fax: [phone]   [facility]  [address] [place], [place]  [address] [phone]   Fax [phone]

## 2016-01-08 NOTE — Patient Instructions (Signed)

## 2016-01-30 ENCOUNTER — Ambulatory Visit (INDEPENDENT_AMBULATORY_CARE_PROVIDER_SITE_OTHER): Payer: Medicare Other | Admitting: *Deleted

## 2016-01-30 DIAGNOSIS — I255 Ischemic cardiomyopathy: Secondary | ICD-10-CM | POA: Diagnosis not present

## 2016-01-30 DIAGNOSIS — Z95810 Presence of automatic (implantable) cardiac defibrillator: Secondary | ICD-10-CM

## 2016-01-30 NOTE — Progress Notes (Signed)
Remote ICD transmission.   

## 2016-02-20 ENCOUNTER — Encounter: Payer: Self-pay | Admitting: Cardiology

## 2016-02-21 LAB — CUP PACEART REMOTE DEVICE CHECK
Battery Remaining Longevity: 68 mo
Battery Voltage: 2.98 V
Brady Statistic AP VP Percent: 5.2 %
Brady Statistic AS VP Percent: 1 %
Brady Statistic AS VS Percent: 5.2 %
Brady Statistic RA Percent Paced: 93 %
HIGH POWER IMPEDANCE MEASURED VALUE: 82 Ohm
HIGH POWER IMPEDANCE MEASURED VALUE: 82 Ohm
Implantable Lead Implant Date: 20080711
Implantable Lead Location: 753859
Implantable Lead Model: 7122
Lead Channel Impedance Value: 340 Ohm
Lead Channel Impedance Value: 380 Ohm
Lead Channel Pacing Threshold Amplitude: 0.5 V
Lead Channel Sensing Intrinsic Amplitude: 2.2 mV
Lead Channel Setting Sensing Sensitivity: 0.5 mV
MDC IDC LEAD IMPLANT DT: 20080711
MDC IDC LEAD LOCATION: 753860
MDC IDC MSMT BATTERY REMAINING PERCENTAGE: 82 %
MDC IDC MSMT LEADCHNL RA PACING THRESHOLD PULSEWIDTH: 0.5 ms
MDC IDC MSMT LEADCHNL RV SENSING INTR AMPL: 12 mV
MDC IDC PG SERIAL: 7225183
MDC IDC SESS DTM: 20170517060017
MDC IDC SET LEADCHNL RA PACING AMPLITUDE: 1.5 V
MDC IDC SET LEADCHNL RV PACING AMPLITUDE: 2.5 V
MDC IDC SET LEADCHNL RV PACING PULSEWIDTH: 0.5 ms
MDC IDC STAT BRADY AP VS PERCENT: 90 %
MDC IDC STAT BRADY RV PERCENT PACED: 5.2 %

## 2016-03-14 ENCOUNTER — Other Ambulatory Visit: Payer: Self-pay | Admitting: Cardiology

## 2016-03-14 NOTE — Telephone Encounter (Signed)
Medication   ramipril (ALTACE) 2.5 MG capsule [11260]       ramipril (ALTACE) 2.5 MG capsule [id number] DISCONTINUED     Order Details    Dose, Route, Frequency: As Directed    Dispense Quantity:  90 capsule Refills:  3 Fills Remaining:  3          Sig: TAKE 1 CAPSULE DAILY   Patient taking differently: TAKE 1 CAPSULE BY MOUTH DAILY         Discontinue Date:  12/03/2015 1527 Discontinue User:  Emmaline Life, RN Discontinue Reason:  Discontinued by provider   Written Date:  03/16/15 Expiration Date:  03/15/16     Start Date:  03/16/15 End Date:  12/03/15     Ordering Provider:  Carlena Bjornstad, MD Authorizing Provider:  Carlena Bjornstad, MD Ordering User:  Margarito Liner, LPN                    Original Order:  ramipril (ALTACE) 2.5 MG capsule [id number]        Pharmacy:  [facility], [place]

## 2016-03-24 ENCOUNTER — Telehealth: Payer: Self-pay | Admitting: Cardiovascular Disease

## 2016-03-24 NOTE — Telephone Encounter (Signed)
Spoke with Pamala Hurry in device clinic who advised that patient call the [employer] for advice on device because he cannot get it to stop beeping. He is aware of appointment tomorrow with Almyra Deforest, PA.  I advised him that his device can be checked at that appointment if needed.  He verbalized understanding and agreement.

## 2016-03-24 NOTE — Telephone Encounter (Signed)
Work him in to see PA soon

## 2016-03-24 NOTE — Telephone Encounter (Signed)
New message    Pt c/o Shortness Of Breath: STAT if SOB developed within the last 24 hours or pt is noticeably SOB on the phone  1. Are you currently SOB (can you hear that pt is SOB on the phone)? A little over the phone today  2. How long have you been experiencing SOB? For about the last week  3. Are you SOB when sitting or when up moving around? Up moving around  4. Are you currently experiencing any other symptoms? The pt is feeling weak and sob, for about a week.

## 2016-03-24 NOTE — Telephone Encounter (Signed)
Spoke with patient who states he is experiencing worsening fatigue, chest tightness and SOB over the past 2 weeks.  He does not need additional pillows when laying flat to sleep.  States his wife told him that sometimes when he sleeps on his back he stops breathing for a short time.  He states he wonders if his pacemaker needs adjusting.  Complains that his legs get weak when he gets up to try to do something.  Denies dizziness, feeling like he will pass out.  States blood pressure running 98/45 mmHg to 120/59 mmHg.  States he has not used NTG.  Reports chest tightness resolves when he rests. I advised him to send a remote transmission since last was done in May and that I will forward to Dr. Acie Fredrickson for advice.  He verbalized understanding and agreement.

## 2016-03-25 ENCOUNTER — Other Ambulatory Visit: Payer: Self-pay | Admitting: Physician Assistant

## 2016-03-25 ENCOUNTER — Encounter: Payer: Self-pay | Admitting: *Deleted

## 2016-03-25 ENCOUNTER — Encounter: Payer: Self-pay | Admitting: Physician Assistant

## 2016-03-25 ENCOUNTER — Ambulatory Visit (INDEPENDENT_AMBULATORY_CARE_PROVIDER_SITE_OTHER): Payer: Medicare Other | Admitting: Physician Assistant

## 2016-03-25 VITALS — BP 136/70 | HR 70 | Ht 67.0 in | Wt 163.4 lb

## 2016-03-25 DIAGNOSIS — G4733 Obstructive sleep apnea (adult) (pediatric): Secondary | ICD-10-CM

## 2016-03-25 DIAGNOSIS — R0602 Shortness of breath: Secondary | ICD-10-CM | POA: Diagnosis not present

## 2016-03-25 DIAGNOSIS — I200 Unstable angina: Secondary | ICD-10-CM | POA: Diagnosis not present

## 2016-03-25 DIAGNOSIS — Z95810 Presence of automatic (implantable) cardiac defibrillator: Secondary | ICD-10-CM

## 2016-03-25 DIAGNOSIS — I5022 Chronic systolic (congestive) heart failure: Secondary | ICD-10-CM | POA: Diagnosis not present

## 2016-03-25 DIAGNOSIS — I25810 Atherosclerosis of coronary artery bypass graft(s) without angina pectoris: Secondary | ICD-10-CM | POA: Diagnosis not present

## 2016-03-25 DIAGNOSIS — I255 Ischemic cardiomyopathy: Secondary | ICD-10-CM

## 2016-03-25 DIAGNOSIS — R079 Chest pain, unspecified: Secondary | ICD-10-CM | POA: Diagnosis not present

## 2016-03-25 DIAGNOSIS — E785 Hyperlipidemia, unspecified: Secondary | ICD-10-CM

## 2016-03-25 DIAGNOSIS — I10 Essential (primary) hypertension: Secondary | ICD-10-CM

## 2016-03-25 LAB — CBC WITH DIFFERENTIAL/PLATELET
Basophils Absolute: 76 cells/uL (ref 0–200)
Basophils Relative: 1 %
EOS PCT: 3 %
Eosinophils Absolute: 228 cells/uL (ref 15–500)
HCT: 36.8 % — ABNORMAL LOW (ref 38.5–50.0)
HEMOGLOBIN: 12.5 g/dL — AB (ref 13.2–17.1)
LYMPHS ABS: 1064 {cells}/uL (ref 850–3900)
Lymphocytes Relative: 14 %
MCH: 28.9 pg (ref 27.0–33.0)
MCHC: 34 g/dL (ref 32.0–36.0)
MCV: 85.2 fL (ref 80.0–100.0)
MPV: 10.5 fL (ref 7.5–12.5)
Monocytes Absolute: 608 cells/uL (ref 200–950)
Monocytes Relative: 8 %
NEUTROS ABS: 5624 {cells}/uL (ref 1500–7800)
Neutrophils Relative %: 74 %
Platelets: 227 10*3/uL (ref 140–400)
RBC: 4.32 MIL/uL (ref 4.20–5.80)
RDW: 14.8 % (ref 11.0–15.0)
WBC: 7.6 10*3/uL (ref 3.8–10.8)

## 2016-03-25 LAB — BASIC METABOLIC PANEL
BUN: 23 mg/dL (ref 7–25)
CHLORIDE: 103 mmol/L (ref 98–110)
CO2: 27 mmol/L (ref 20–31)
Calcium: 9.5 mg/dL (ref 8.6–10.3)
Creat: 1.25 mg/dL — ABNORMAL HIGH (ref 0.70–1.11)
GLUCOSE: 106 mg/dL — AB (ref 65–99)
POTASSIUM: 5.2 mmol/L (ref 3.5–5.3)
SODIUM: 139 mmol/L (ref 135–146)

## 2016-03-25 LAB — PROTIME-INR
INR: 1
Prothrombin Time: 10.4 s (ref 9.0–11.5)

## 2016-03-25 MED ORDER — ISOSORBIDE MONONITRATE ER 30 MG PO TB24: 15.0000 mg | ORAL_TABLET | Freq: Every day | ORAL | Status: DC

## 2016-03-25 NOTE — Patient Instructions (Addendum)
Medication Instructions:  Your physician has recommended you make the following change in your medication:  1.  START Imdur 30 mg taking 1/2 tablet daily   Labwork: TODAY:  CBC W/ DIFF, BMET, & PT/INR  Testing/Procedures: Your physician has requested that you have a cardiac catheterization. Cardiac catheterization is used to diagnose and/or treat various heart conditions. Doctors may recommend this procedure for a number of different reasons. The most common reason is to evaluate chest pain. Chest pain can be a symptom of coronary artery disease (CAD), and cardiac catheterization can show whether plaque is narrowing or blocking your heart's arteries. This procedure is also used to evaluate the valves, as well as measure the blood flow and oxygen levels in different parts of your heart. For further information please visit Huge[employer]iest[employer]tn. Please follow instruction sheet, as given.   Your physician has recommended that you have a sleep study. This test records several body functions during sleep, including: brain activity, eye movement, oxygen and carbon dioxide blood levels, heart rate and rhythm, breathing rate and rhythm, the flow of air through your mouth and nose, snoring, body muscle movements, and chest and belly movement.    Follow-Up: Your physician recommends that you schedule a follow-up appointment in:  WILL BE SET UP AT DISCHARGE   Any Other Special Instructions Will Be Listed Below (If Applicable).  Coronary Angiogram A coronary angiogram, also called coronary angiography, is an X-ray procedure used to look at the arteries in the heart. In this procedure, a dye (contrast dye) is injected through a long, hollow tube (catheter). The catheter is about the size of a piece of cooked spaghetti and is inserted through your groin, wrist, or arm. The dye is injected into each artery, and X-rays are then taken to show if there is a blockage in the arteries of your heart. LET [facility] CARE PROVIDER KNOW ABOUT:  Any allergies you have, including allergies to shellfish or contrast dye.   All medicines you are taking, including vitamins, herbs, eye drops, creams, and over-the-counter medicines.   Previous problems you or members of your family have had with the use of anesthetics.   Any blood disorders you have.   Previous surgeries you have had.  History of kidney problems or failure.   Other medical conditions you have. RISKS AND COMPLICATIONS  Generally, a coronary angiogram is a safe procedure. However, problems can occur and include:  Allergic reaction to the dye.  Bleeding from the access site or other locations.  Kidney injury, especially in people with impaired kidney function.  Stroke (rare).  Heart attack (rare). BEFORE THE PROCEDURE   Do not eat or drink anything after midnight the night before the procedure or as directed by your health care provider.   Ask your health care provider about changing or stopping your regular medicines. This is especially important if you are taking diabetes medicines or blood thinners. PROCEDURE  You may be given a medicine to help you relax (sedative) before the procedure. This medicine is given through an intravenous (IV) access tube that is inserted into one of your veins.   The area where the catheter will be inserted will be washed and shaved. This is usually done in the groin but may be done in the fold of your arm (near your elbow) or in the wrist.   A medicine will be given to numb the area where the catheter will be inserted (local anesthetic).   The health care provider will insert  the catheter into an artery. The catheter will be guided by using a special type of X-ray (fluoroscopy) of the blood vessel being examined.   A special dye will then be injected into the catheter, and X-rays will be taken. The dye will help to show where any narrowing or blockages are located in the heart  arteries.  AFTER THE PROCEDURE   If the procedure is done through the leg, you will be kept in bed lying flat for several hours. You will be instructed to not bend or cross your legs.  The insertion site will be checked frequently.   The pulse in your feet or wrist will be checked frequently.   Additional blood tests, X-rays, and an electrocardiogram may be done.    This information is not intended to replace advice given to you by your health care provider. Make sure you discuss any questions you have with your health care provider.   Document Released: 03/08/2003 Document Revised: 09/22/2014 Document Reviewed: 01/24/2013 Elsevier Interactive Patient Education [employer].    If you need a refill on your cardiac medications before your next appointment, please call your pharmacy.

## 2016-03-25 NOTE — Progress Notes (Signed)
Cardiology Office Note    Date:  03/25/2016   ID:  [name], DOB 1929/07/05, MRN [id number]  PCP:  Walker Kehr, MD  Cardiologist:  Dr. Acie Fredrickson Electrophysiologist: Dr. Caryl Comes  Chief Complaint  Patient presents with  . Follow-up    seen for Dr. Acie Fredrickson    History of Present Illness:  Rontez Phillippi. is a 82 y.o. male with PMH of CAD s/p CABG 1992, HLD, chronic systolic HF and chronotropnic incompetence, ischemic cardiomyopathy s/p St Jude ICD 03/2007 w/ gen change 10/15. He had cardiac catheterization was in 2006 which showed severe native disease with total occlusion of LAD, total occlusion of ramus branch of left circumflex, total occlusion of RCA, patent SVG to ramus of the circumflex, occluded vein graft to distal RCA which appears to be chronic, patent LIMA to LAD, EF 35% with 2+ MR. He had another cardiac cath in 2008 which [place] unchanged. He had an episode of syncope in the past related to hypotension. Per Dr. Elmarie Shiley note, despite later he was having hypertension, He was hesitant to increase ramipril due to his syncope in the past. Losartan was added to his medication. His blood pressure was better controlled, therefore he was left on both ACE inhibitor and ARB. Ramipril was later stopped, he is currently on Coreg and losartan. His last visit with Dr. Caryl Comes was in November 2016, his defibrillator was interrogated, no changes were made. He was last seen by Dr. Acie Fredrickson in April 2017, at which time he was doing well.  Patient called yesterday complaining of worsening fatigue, chest tightness and shortness of breath for the past 2 weeks. He is still chest discomfort has been progressively worsening mainly with exertion. He also noticed dyspnea on exertion as well. Symptom is similar to the previous heart attack but not as severe. His symptom is very concerning for unstable progressive angina. I have discussed with Dr. Acie Fredrickson who recommended go directly to cardiac catheterization  instead of stress test. He also has occasional episodes where he stopped breathing during sleep. We will arrange outpatient sleep study. He had a remote transmission yesterday, I have discussed with the device clinic, he had 10 seconds of sinus tach noted on 6/13, he also had 41 beats of nonsustained VT on 5/27. He did not receive shock at the time. Given the prolonged episode of VT, it is a another reason why we need to do ischemic workup.  Note, his ICD is on the R side as he is left handed   Past Medical History  Diagnosis Date  . History of colon polyps   . Hyperlipidemia   . Hypertension   . 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 S/P CABG (coronary artery bypass graft) 1992  . ICD (implantable cardiac defibrillator) in place 03/2007    July, 2008  . Chronotropic incompetence     treated w/pacemaker, ICD 7.2008. This was placed after CPX done post 2008 cath. CPX showed only chronotropic incompetence  . Arrhythmia   . CHF (congestive heart failure) ([place])   . Syncopal episodes     relative hypotension  . Mitral regurgitation     mild  . Numbness and tingling of left arm and leg     improved after tx w/prednisone  . Hx of CABG  1992  . Grief reaction   . Arthritis   . Blood transfusion 1969  . Myocardial infarction ([place])   . Ejection fraction     .    Past Surgical History  Procedure Laterality Date  . Coronary artery bypass graft  1992  . Cardiac catheterization    . Cardiac defibrillator placement  2008  . Leg surgery  1942    Fx leg repair with artificial bone  . Cataract extraction  12/14 & 1/15    right eye in December and Left eye in January  . Implantable cardioverter defibrillator (icd) generator change N/A 06/21/2014    Procedure: ICD GENERATOR CHANGE;  Surgeon: Deboraha Sprang,  MD;  Location: [facility] CATH LAB;  Service: Cardiovascular;  Laterality: N/A;    Current Medications: Outpatient Prescriptions Prior to Visit  Medication Sig Dispense Refill  . aspirin 81 MG tablet Take 1 tablet (81 mg total) by mouth daily. 30 tablet 11  . atorvastatin (LIPITOR) 40 MG tablet Take 1 tablet (40 mg total) by mouth daily. 90 tablet 2  . carvedilol (COREG) 25 MG tablet Take 1 tablet (25 mg total) by mouth 2 (two) times daily. 180 tablet 3  . Cholecalciferol 1000 UNITS tablet Take 1,000 Units by mouth daily.      . digoxin (LANOXIN) 0.125 MG tablet TAKE ONE-HALF TABLET (0.0625 MG) DAILY 45 tablet 2  . hydrochlorothiazide (MICROZIDE) 12.5 MG capsule Take 1 capsule (12.5 mg total) by mouth daily. 90 capsule 3  . losartan (COZAAR) 100 MG tablet Take 1 tablet (100 mg total) by mouth daily. 90 tablet 3  . Multiple Vitamins-Minerals (PRESERVISION AREDS 2) CAPS Take 1 capsule by mouth 2 (two) times daily.    . nitroGLYCERIN (NITROLINGUAL) 0.4 MG/SPRAY spray Place 1 spray under the tongue every 5 (five) minutes x 3 doses as needed for chest pain.      No facility-administered medications prior to visit.     Allergies:   Morphine and Ramipril   Social History   Social History  . Marital Status: Married    Spouse Name: N/A  . Number of Children: N/A  . Years of Education: N/A   Occupational History  . Retired    Social History Main Topics  . Smoking status: Former Smoker    Types: Cigars  . Smokeless tobacco: Never Used     Comment: quit 1973  . Alcohol Use: No  . Drug Use: No  . Sexual Activity: Not Asked   Other Topics Concern  . None   Social History Narrative   Widowed - 06/2010     Family History:  The patient's family history includes Colon cancer in his other; Hypertension in his father, mother, and other.   ROS:   Please see the history of present illness.    ROS All other systems reviewed and are negative.   PHYSICAL EXAM:   VS:  BP 136/70 mmHg  Pulse 70   Ht 5\' 7"  (1.702 m)  Wt 163 lb 6.4 oz (74.118 kg)  BMI 25.59 kg/m2   GEN: Well nourished, well developed, in no acute distress HEENT: normal Neck: no JVD, carotid bruits, or masses Cardiac: RRR; no murmurs, rubs, or gallops,no edema  Respiratory:  clear to auscultation bilaterally, normal work of breathing GI: soft, nontender, nondistended, + BS MS: no deformity or atrophy Skin: warm and dry, no rash Neuro:  Alert and Oriented x 3, Strength and sensation are intact Psych: euthymic mood, full affect  Wt Readings from  Last 3 Encounters:  03/25/16 163 lb 6.4 oz (74.118 kg)  01/08/16 167 lb 12.8 oz (76.114 kg)  08/01/15 169 lb 9.6 oz (76.93 kg)      Studies/Labs Reviewed:   EKG:  EKG is ordered today.  The ekg ordered today demonstrates paced rhythm  Recent Labs: 05/04/2015: TSH 2.94 07/17/2015: ALT 17 03/25/2016: BUN 23; Creat 1.25*; Hemoglobin 12.5*; Platelets 227; Potassium 5.2; Sodium 139   Lipid Panel    Component Value Date/Time   CHOL 86* 07/17/2015 1023   TRIG 145 07/17/2015 1023   HDL 25* 07/17/2015 1023   CHOLHDL 3.4 07/17/2015 1023   VLDL 29 07/17/2015 1023   LDLCALC 32 07/17/2015 1023   LDLDIRECT 50.1 04/28/2011 0827    Additional studies/ records that were reviewed today include:   Cath 02/27/2007 ASSESSMENT: 1. Severe native coronary disease as described above. 2. Two of three bypass grafts patent including the left internal  mammary artery to the left anterior descending and the saphenous  vein graft to the ramus. There is a total occlusion of the  saphenous vein graft to the right coronary artery which is chronic. 3. Moderately reduced LV function and ejection fraction approximately  35% with an apical aneurysm. 4. Mild pulmonary arterial hypertension with normal wedge pressure. 5. A 1-2+ mitral regurgitation.   St jude ICD implantation 06/21/2014 St Jude pulse generator, serial number [id number] atrial lead St Jude  [id number]   ASSESSMENT:    1. Unstable angina ([place])   2. Ischemic cardiomyopathy   3. Coronary artery disease involving coronary bypass graft of native heart without angina pectoris   4. Chronic systolic CHF (congestive heart failure) ([place])   5. Hyperlipidemia   6. Automatic implantable cardioverter-defibrillator in situ   7. Essential hypertension   8. SOB (shortness of breath)   9. OSA (obstructive sleep apnea)      PLAN:  In order of problems listed above:  1. Unstable angina: Symptom progressively worsening with exertional chest discomfort and shortness of breath. The past 2 weeks. After discussing with Dr. Acie Fredrickson, we will plan for cardiac catheterization.  Risk and benefit of procedure explained to the patient who display clear understanding and agree to proceed. Discussed with patient possible procedural risk include bleeding, vascular injury, renal injury, arrythmia, MI, stroke and loss of limb or life.  2.  CAD s/p CABG 1992: previous cath showed chronically occluded SVG to RCA, patent LIMA to LAD and patent SVG to ramus  3. ischemic cardiomyopathy s/p St Jude ICD 03/2007 w/ gen change 10/15: 41 beats run of nonsustained VT near end of May, currently undergoing ischemic workup.  4. HLD: Continue Lipitor  5. chronic systolic HF: Euvolemic on physical exam.  6.  Likely OSA: complained of loud snoring at night, also episodes of apnea noted by wife. Will arrange sleep study after cath.    Medication Adjustments/Labs and Tests Ordered: Current medicines are reviewed at length with the patient today.  Concerns regarding medicines are outlined above.  Medication changes, Labs and Tests ordered today are listed in the Patient Instructions below. Patient Instructions  Medication Instructions:  Your physician has recommended you make the following change in your medication:  1.  START Imdur 30 mg taking 1/2 tablet daily   Labwork: TODAY:  CBC W/ DIFF, BMET, &  PT/INR  Testing/Procedures: Your physician has requested that you have a cardiac catheterization. Cardiac catheterization is used to diagnose and/or treat various heart conditions. Doctors may recommend this procedure for a number of  different reasons. The most common reason is to evaluate chest pain. Chest pain can be a symptom of coronary artery disease (CAD), and cardiac catheterization can show whether plaque is narrowing or blocking your heart's arteries. This procedure is also used to evaluate the valves, as well as measure the blood flow and oxygen levels in different parts of your heart. For further information please visit HugeFiesta[employer]tn. Please follow instruction sheet, as given.   Your physician has recommended that you have a sleep study. This test records several body functions during sleep, including: brain activity, eye movement, oxygen and carbon dioxide blood levels, heart rate and rhythm, breathing rate and rhythm, the flow of air through your mouth and nose, snoring, body muscle movements, and chest and belly movement.    Follow-Up: Your physician recommends that you schedule a follow-up appointment in:  WILL BE SET UP AT DISCHARGE   Any Other Special Instructions Will Be Listed Below (If Applicable).  Coronary Angiogram A coronary angiogram, also called coronary angiography, is an X-ray procedure used to look at the arteries in the heart. In this procedure, a dye (contrast dye) is injected through a long, hollow tube (catheter). The catheter is about the size of a piece of cooked spaghetti and is inserted through your groin, wrist, or arm. The dye is injected into each artery, and X-rays are then taken to show if there is a blockage in the arteries of your heart. LET [facility] CARE PROVIDER KNOW ABOUT:  Any allergies you have, including allergies to shellfish or contrast dye.   All medicines you are taking, including vitamins, herbs, eye drops, creams, and  over-the-counter medicines.   Previous problems you or members of your family have had with the use of anesthetics.   Any blood disorders you have.   Previous surgeries you have had.  History of kidney problems or failure.   Other medical conditions you have. RISKS AND COMPLICATIONS  Generally, a coronary angiogram is a safe procedure. However, problems can occur and include:  Allergic reaction to the dye.  Bleeding from the access site or other locations.  Kidney injury, especially in people with impaired kidney function.  Stroke (rare).  Heart attack (rare). BEFORE THE PROCEDURE   Do not eat or drink anything after midnight the night before the procedure or as directed by your health care provider.   Ask your health care provider about changing or stopping your regular medicines. This is especially important if you are taking diabetes medicines or blood thinners. PROCEDURE  You may be given a medicine to help you relax (sedative) before the procedure. This medicine is given through an intravenous (IV) access tube that is inserted into one of your veins.   The area where the catheter will be inserted will be washed and shaved. This is usually done in the groin but may be done in the fold of your arm (near your elbow) or in the wrist.   A medicine will be given to numb the area where the catheter will be inserted (local anesthetic).   The health care provider will insert the catheter into an artery. The catheter will be guided by using a special type of X-ray (fluoroscopy) of the blood vessel being examined.   A special dye will then be injected into the catheter, and X-rays will be taken. The dye will help to show where any narrowing or blockages are located in the heart arteries.  AFTER THE PROCEDURE   If the procedure is done  through the leg, you will be kept in bed lying flat for several hours. You will be instructed to not bend or cross your legs.  The  insertion site will be checked frequently.   The pulse in your feet or wrist will be checked frequently.   Additional blood tests, X-rays, and an electrocardiogram may be done.    This information is not intended to replace advice given to you by your health care provider. Make sure you discuss any questions you have with your health care provider.   Document Released: 03/08/2003 Document Revised: 09/22/2014 Document Reviewed: 01/24/2013 Elsevier Interactive Patient Education [employer].    If you need a refill on your cardiac medications before your next appointment, please call your pharmacy.       Hilbert Corrigan, [place]  03/25/2016 10:42 PM    [facility] [place], [place], [place]  [address] Phone: [phone]; Fax: [phone]   I am signing this note to complete the note before his cardiac catheterization. The full visit was performed by Almyra Deforest, PA.   [name] 03/26/2016 8:24 AM

## 2016-03-26 ENCOUNTER — Encounter (HOSPITAL_COMMUNITY): Payer: Self-pay | Admitting: General Practice

## 2016-03-26 ENCOUNTER — Ambulatory Visit (HOSPITAL_COMMUNITY)
Admission: RE | Admit: 2016-03-26 | Discharge: 2016-03-27 | Disposition: A | Discharge status: Home / Self Care | Payer: Medicare Other | Source: Ambulatory Visit | Attending: Cardiovascular Disease | Admitting: Cardiovascular Disease

## 2016-03-26 ENCOUNTER — Other Ambulatory Visit: Payer: Self-pay

## 2016-03-26 ENCOUNTER — Encounter (HOSPITAL_COMMUNITY)
Admission: RE | Disposition: A | Discharge status: Home / Self Care | Payer: Self-pay | Source: Ambulatory Visit | Attending: Cardiovascular Disease

## 2016-03-26 DIAGNOSIS — I255 Ischemic cardiomyopathy: Secondary | ICD-10-CM | POA: Insufficient documentation

## 2016-03-26 DIAGNOSIS — Z955 Presence of coronary angioplasty implant and graft: Secondary | ICD-10-CM | POA: Insufficient documentation

## 2016-03-26 DIAGNOSIS — Z87891 Personal history of nicotine dependence: Secondary | ICD-10-CM | POA: Diagnosis not present

## 2016-03-26 DIAGNOSIS — I25710 Atherosclerosis of autologous vein coronary artery bypass graft(s) with unstable angina pectoris: Secondary | ICD-10-CM | POA: Insufficient documentation

## 2016-03-26 DIAGNOSIS — Z86010 Personal history of colonic polyps: Secondary | ICD-10-CM | POA: Insufficient documentation

## 2016-03-26 DIAGNOSIS — I5042 Chronic combined systolic (congestive) and diastolic (congestive) heart failure: Secondary | ICD-10-CM | POA: Insufficient documentation

## 2016-03-26 DIAGNOSIS — Z885 Allergy status to narcotic agent status: Secondary | ICD-10-CM | POA: Diagnosis not present

## 2016-03-26 DIAGNOSIS — I272 Other secondary pulmonary hypertension: Secondary | ICD-10-CM | POA: Insufficient documentation

## 2016-03-26 DIAGNOSIS — I4589 Other specified conduction disorders: Secondary | ICD-10-CM | POA: Diagnosis not present

## 2016-03-26 DIAGNOSIS — I110 Hypertensive heart disease with heart failure: Secondary | ICD-10-CM | POA: Insufficient documentation

## 2016-03-26 DIAGNOSIS — I2582 Chronic total occlusion of coronary artery: Secondary | ICD-10-CM | POA: Insufficient documentation

## 2016-03-26 DIAGNOSIS — E785 Hyperlipidemia, unspecified: Secondary | ICD-10-CM | POA: Insufficient documentation

## 2016-03-26 DIAGNOSIS — I252 Old myocardial infarction: Secondary | ICD-10-CM | POA: Diagnosis not present

## 2016-03-26 DIAGNOSIS — Z800 Family history of malignant neoplasm of digestive organs: Secondary | ICD-10-CM | POA: Diagnosis not present

## 2016-03-26 DIAGNOSIS — I340 Nonrheumatic mitral (valve) insufficiency: Secondary | ICD-10-CM | POA: Diagnosis not present

## 2016-03-26 DIAGNOSIS — Z7982 Long term (current) use of aspirin: Secondary | ICD-10-CM | POA: Diagnosis not present

## 2016-03-26 DIAGNOSIS — Z8249 Family history of ischemic heart disease and other diseases of the circulatory system: Secondary | ICD-10-CM | POA: Insufficient documentation

## 2016-03-26 DIAGNOSIS — Z95810 Presence of automatic (implantable) cardiac defibrillator: Secondary | ICD-10-CM | POA: Insufficient documentation

## 2016-03-26 DIAGNOSIS — I200 Unstable angina: Secondary | ICD-10-CM | POA: Insufficient documentation

## 2016-03-26 DIAGNOSIS — I25110 Atherosclerotic heart disease of native coronary artery with unstable angina pectoris: Secondary | ICD-10-CM

## 2016-03-26 DIAGNOSIS — M1990 Unspecified osteoarthritis, unspecified site: Secondary | ICD-10-CM | POA: Insufficient documentation

## 2016-03-26 DIAGNOSIS — G4733 Obstructive sleep apnea (adult) (pediatric): Secondary | ICD-10-CM | POA: Diagnosis not present

## 2016-03-26 HISTORY — PX: CARDIAC CATHETERIZATION: SHX172

## 2016-03-26 HISTORY — DX: Personal history of other medical treatment: Z92.89

## 2016-03-26 HISTORY — DX: Migraine, unspecified, not intractable, without status migrainosus: G43.909

## 2016-03-26 HISTORY — DX: Presence of automatic (implantable) cardiac defibrillator: Z95.810

## 2016-03-26 LAB — POCT ACTIVATED CLOTTING TIME: ACTIVATED CLOTTING TIME: 246 s

## 2016-03-26 SURGERY — LEFT HEART CATH AND CORS/GRAFTS ANGIOGRAPHY

## 2016-03-26 MED ORDER — HEPARIN (PORCINE) IN NACL 2-0.9 UNIT/ML-% IJ SOLN
INTRAMUSCULAR | Status: DC | PRN
  Administered 2016-03-26: 1500 mL

## 2016-03-26 MED ORDER — LIDOCAINE HCL (PF) 1 % IJ SOLN
INTRAMUSCULAR | Status: AC
  Filled 2016-03-26: qty 30

## 2016-03-26 MED ORDER — FENTANYL CITRATE (PF) 100 MCG/2ML IJ SOLN
INTRAMUSCULAR | Status: AC
  Filled 2016-03-26: qty 2

## 2016-03-26 MED ORDER — PRESERVISION AREDS 2 PO CAPS: 1.0000 | ORAL_CAPSULE | Freq: Two times a day (BID) | ORAL | Status: DC

## 2016-03-26 MED ORDER — HEPARIN SODIUM (PORCINE) 1000 UNIT/ML IJ SOLN
INTRAMUSCULAR | Status: DC | PRN
  Administered 2016-03-26: 4000 [IU] via INTRAVENOUS
  Administered 2016-03-26: 2000 [IU] via INTRAVENOUS
  Administered 2016-03-26: 6000 [IU] via INTRAVENOUS

## 2016-03-26 MED ORDER — HEPARIN SODIUM (PORCINE) 1000 UNIT/ML IJ SOLN
INTRAMUSCULAR | Status: AC
  Filled 2016-03-26: qty 1

## 2016-03-26 MED ORDER — IOPAMIDOL (ISOVUE-370) INJECTION 76%
INTRAVENOUS | Status: DC | PRN
  Administered 2016-03-26: 210 mL via INTRA_ARTERIAL

## 2016-03-26 MED ORDER — IOPAMIDOL (ISOVUE-370) INJECTION 76%
INTRAVENOUS | Status: AC
  Filled 2016-03-26: qty 50

## 2016-03-26 MED ORDER — ASPIRIN 81 MG PO CHEW
81.0000 mg | CHEWABLE_TABLET | Freq: Every day | ORAL | Status: DC
  Administered 2016-03-27: 81 mg via ORAL
  Filled 2016-03-26 (×2): qty 1

## 2016-03-26 MED ORDER — NITROGLYCERIN 0.4 MG/SPRAY TL SOLN: 1.0000 | Status: DC | PRN

## 2016-03-26 MED ORDER — MIDAZOLAM HCL 2 MG/2ML IJ SOLN
INTRAMUSCULAR | Status: AC
  Filled 2016-03-26: qty 2

## 2016-03-26 MED ORDER — TICAGRELOR 90 MG PO TABS
ORAL_TABLET | ORAL | Status: AC
  Filled 2016-03-26: qty 2

## 2016-03-26 MED ORDER — HEPARIN (PORCINE) IN NACL 2-0.9 UNIT/ML-% IJ SOLN
INTRAMUSCULAR | Status: AC
  Filled 2016-03-26: qty 1500

## 2016-03-26 MED ORDER — MIDAZOLAM HCL 2 MG/2ML IJ SOLN
INTRAMUSCULAR | Status: DC | PRN
  Administered 2016-03-26 (×2): 1 mg via INTRAVENOUS

## 2016-03-26 MED ORDER — SODIUM CHLORIDE 0.9 % WEIGHT BASED INFUSION
3.0000 mL/kg/h | INTRAVENOUS | Status: DC
  Administered 2016-03-26: 3 mL/kg/h via INTRAVENOUS

## 2016-03-26 MED ORDER — NITROGLYCERIN 1 MG/10 ML FOR IR/CATH LAB
INTRA_ARTERIAL | Status: DC | PRN
  Administered 2016-03-26: 200 ug via INTRACORONARY

## 2016-03-26 MED ORDER — NITROGLYCERIN 1 MG/10 ML FOR IR/CATH LAB
INTRA_ARTERIAL | Status: AC
  Filled 2016-03-26: qty 10

## 2016-03-26 MED ORDER — SODIUM CHLORIDE 0.9 % IV SOLN: INTRAVENOUS | Status: AC

## 2016-03-26 MED ORDER — VERAPAMIL HCL 2.5 MG/ML IV SOLN
INTRAVENOUS | Status: AC
  Filled 2016-03-26: qty 2

## 2016-03-26 MED ORDER — ATORVASTATIN CALCIUM 40 MG PO TABS
40.0000 mg | ORAL_TABLET | Freq: Every day | ORAL | Status: DC
  Administered 2016-03-26: 40 mg via ORAL
  Filled 2016-03-26: qty 1

## 2016-03-26 MED ORDER — LOSARTAN POTASSIUM 50 MG PO TABS
100.0000 mg | ORAL_TABLET | Freq: Every day | ORAL | Status: DC
  Administered 2016-03-27: 10:00:00 100 mg via ORAL
  Filled 2016-03-26 (×2): qty 2

## 2016-03-26 MED ORDER — IOPAMIDOL (ISOVUE-370) INJECTION 76%
INTRAVENOUS | Status: AC
  Filled 2016-03-26: qty 125

## 2016-03-26 MED ORDER — TICAGRELOR 90 MG PO TABS
90.0000 mg | ORAL_TABLET | Freq: Two times a day (BID) | ORAL | Status: DC
Start: 2016-03-26 — End: 2016-03-27
  Administered 2016-03-26 – 2016-03-27 (×2): 90 mg via ORAL
  Filled 2016-03-26 (×2): qty 1

## 2016-03-26 MED ORDER — SODIUM CHLORIDE 0.9% FLUSH: 3.0000 mL | Freq: Two times a day (BID) | INTRAVENOUS | Status: DC

## 2016-03-26 MED ORDER — ASPIRIN 81 MG PO CHEW: 81.0000 mg | CHEWABLE_TABLET | ORAL | Status: DC

## 2016-03-26 MED ORDER — SODIUM CHLORIDE 0.9% FLUSH: 3.0000 mL | INTRAVENOUS | Status: DC | PRN

## 2016-03-26 MED ORDER — ISOSORBIDE MONONITRATE ER 30 MG PO TB24
30.0000 mg | ORAL_TABLET | Freq: Every day | ORAL | Status: DC
  Administered 2016-03-26 – 2016-03-27 (×2): 30 mg via ORAL
  Filled 2016-03-26 (×2): qty 1

## 2016-03-26 MED ORDER — CARVEDILOL 12.5 MG PO TABS
25.0000 mg | ORAL_TABLET | Freq: Two times a day (BID) | ORAL | Status: DC
  Administered 2016-03-26 – 2016-03-27 (×2): 25 mg via ORAL
  Filled 2016-03-26 (×3): qty 2

## 2016-03-26 MED ORDER — SODIUM CHLORIDE 0.9 % IV SOLN: 250.0000 mL | INTRAVENOUS | Status: DC | PRN

## 2016-03-26 MED ORDER — IOPAMIDOL (ISOVUE-370) INJECTION 76%
INTRAVENOUS | Status: AC
  Filled 2016-03-26: qty 100

## 2016-03-26 MED ORDER — HEPARIN (PORCINE) IN NACL 2-0.9 UNIT/ML-% IJ SOLN
INTRAMUSCULAR | Status: AC
  Filled 2016-03-26: qty 1000

## 2016-03-26 MED ORDER — ADULT MULTIVITAMIN W/MINERALS CH
1.0000 | ORAL_TABLET | Freq: Every day | ORAL | Status: DC
  Administered 2016-03-27: 1 via ORAL
  Filled 2016-03-26 (×2): qty 1

## 2016-03-26 MED ORDER — SODIUM CHLORIDE 0.9 % WEIGHT BASED INFUSION: 1.0000 mL/kg/h | INTRAVENOUS | Status: DC

## 2016-03-26 MED ORDER — VERAPAMIL HCL 2.5 MG/ML IV SOLN
INTRAVENOUS | Status: DC | PRN
  Administered 2016-03-26: 10 mL via INTRA_ARTERIAL

## 2016-03-26 MED ORDER — TICAGRELOR 90 MG PO TABS
ORAL_TABLET | ORAL | Status: DC | PRN
  Administered 2016-03-26: 180 mg via ORAL

## 2016-03-26 MED ORDER — ACETAMINOPHEN 325 MG PO TABS: 650.0000 mg | ORAL_TABLET | ORAL | Status: DC | PRN

## 2016-03-26 MED ORDER — LIDOCAINE HCL (PF) 1 % IJ SOLN
INTRAMUSCULAR | Status: DC | PRN
  Administered 2016-03-26: 2 mL via INTRADERMAL

## 2016-03-26 MED ORDER — VITAMIN D 1000 UNITS PO TABS
1000.0000 [IU] | ORAL_TABLET | Freq: Every day | ORAL | Status: DC
  Administered 2016-03-27: 1000 [IU] via ORAL
  Filled 2016-03-26 (×3): qty 1

## 2016-03-26 MED ORDER — ONDANSETRON HCL 4 MG/2ML IJ SOLN: 4.0000 mg | Freq: Four times a day (QID) | INTRAMUSCULAR | Status: DC | PRN

## 2016-03-26 MED ORDER — FENTANYL CITRATE (PF) 100 MCG/2ML IJ SOLN
INTRAMUSCULAR | Status: DC | PRN
  Administered 2016-03-26 (×2): 25 ug via INTRAVENOUS

## 2016-03-26 MED ORDER — DIGOXIN 0.0625 MG HALF TABLET
0.0625 mg | ORAL_TABLET | Freq: Every day | ORAL | Status: DC
  Administered 2016-03-27: 0.0625 mg via ORAL
  Filled 2016-03-26: qty 1

## 2016-03-26 MED ORDER — SODIUM CHLORIDE 0.9% FLUSH
3.0000 mL | Freq: Two times a day (BID) | INTRAVENOUS | Status: DC
Start: 2016-03-26 — End: 2016-03-27
  Administered 2016-03-26: 3 mL via INTRAVENOUS

## 2016-03-26 SURGICAL SUPPLY — 18 items
BALLN EMERGE MR 2.0X20 (BALLOONS) ×3
BALLN ~~LOC~~ EUPHORA RX 2.25X12 (BALLOONS) ×3
BALLOON EMERGE MR 2.0X20 (BALLOONS) IMPLANT
BALLOON ~~LOC~~ EUPHORA RX 2.25X12 (BALLOONS) IMPLANT
CATH INFINITI 5FR MULTPACK ANG (CATHETERS) ×2 IMPLANT
CATH VISTA GUIDE 6FR XBLAD3.5 (CATHETERS) ×2 IMPLANT
DEVICE RAD COMP TR BAND LRG (VASCULAR PRODUCTS) ×2 IMPLANT
GLIDESHEATH SLEND SS 6F .021 (SHEATH) ×2 IMPLANT
KIT ENCORE 26 ADVANTAGE (KITS) ×2 IMPLANT
KIT HEART LEFT (KITS) ×3 IMPLANT
PACK CARDIAC CATHETERIZATION (CUSTOM PROCEDURE TRAY) ×3 IMPLANT
STENT PROMUS PREM MR 2.25X16 (Permanent Stent) ×2 IMPLANT
STENT PROMUS PREM MR 2.25X28 (Permanent Stent) ×2 IMPLANT
SYR MEDRAD MARK V 150ML (SYRINGE) ×3 IMPLANT
TRANSDUCER W/STOPCOCK (MISCELLANEOUS) ×3 IMPLANT
TUBING CIL FLEX 10 FLL-RA (TUBING) ×3 IMPLANT
WIRE COUGAR XT STRL 190CM (WIRE) ×2 IMPLANT
WIRE SAFE-T 1.5MM-J .035X260CM (WIRE) ×2 IMPLANT

## 2016-03-26 NOTE — Interval H&P Note (Signed)
History and Physical Interval Note:  03/26/2016 8:24 AM  [name] [name].  has presented today for cardiac cath  with the diagnosis of unstable angina.   The various methods of treatment have been discussed with the patient and family. After consideration of risks, benefits and other options for treatment, the patient has consented to  Procedure(s): Left Heart Cath and Cors/Grafts Angiography (N/A) as a surgical intervention .  The patient's history has been reviewed, patient examined, no change in status, stable for surgery.  I have reviewed the patient's chart and labs.  Questions were answered to the patient's satisfaction.    Cath Lab Visit (complete for each Cath Lab visit)  Clinical Evaluation Leading to the Procedure:   ACS: No.  Non-ACS:    Anginal Classification: CCS III  Anti-ischemic medical therapy: Maximal Therapy (2 or more classes of medications)  Non-Invasive Test Results: No non-invasive testing performed  Prior CABG: Previous CABG         [name] [name]

## 2016-03-26 NOTE — H&P (View-Only) (Signed)
Cardiology Office Note    Date:  03/25/2016   ID:  [name] Curia., DOB [birth year], MRN [id number]  PCP:  Walker Kehr, MD  Cardiologist:  Dr. Acie Fredrickson Electrophysiologist: Dr. Caryl Comes  Chief Complaint  Patient presents with  . Follow-up    seen for Dr. Acie Fredrickson    History of Present Illness:  [name] Sigona. is a 82 y.o. male with PMH of CAD s/p CABG 1992, HLD, chronic systolic HF and chronotropnic incompetence, ischemic cardiomyopathy s/p St Jude ICD 03/2007 w/ gen change 10/15. He had cardiac catheterization was in 2006 which showed severe native disease with total occlusion of LAD, total occlusion of ramus branch of left circumflex, total occlusion of RCA, patent SVG to ramus of the circumflex, occluded vein graft to distal RCA which appears to be chronic, patent LIMA to LAD, EF 35% with 2+ MR. He had another cardiac cath in 2008 which [place] unchanged. He had an episode of syncope in the past related to hypotension. Per Dr. Elmarie Shiley note, despite later he was having hypertension, He was hesitant to increase ramipril due to his syncope in the past. Losartan was added to his medication. His blood pressure was better controlled, therefore he was left on both ACE inhibitor and ARB. Ramipril was later stopped, he is currently on Coreg and losartan. His last visit with Dr. Caryl Comes was in November 2016, his defibrillator was interrogated, no changes were made. He was last seen by Dr. Acie Fredrickson in April 2017, at which time he was doing well.  Patient called yesterday complaining of worsening fatigue, chest tightness and shortness of breath for the past 2 weeks. He is still chest discomfort has been progressively worsening mainly with exertion. He also noticed dyspnea on exertion as well. Symptom is similar to the previous heart attack but not as severe. His symptom is very concerning for unstable progressive angina. I have discussed with Dr. Acie Fredrickson who recommended go directly to cardiac catheterization  instead of stress test. He also has occasional episodes where he stopped breathing during sleep. We will arrange outpatient sleep study. He had a remote transmission yesterday, I have discussed with the device clinic, he had 10 seconds of sinus tach noted on 6/13, he also had 41 beats of nonsustained VT on 5/27. He did not receive shock at the time. Given the prolonged episode of VT, it is a another reason why we need to do ischemic workup.  Note, his ICD is on the R side as he is left handed   Past Medical History  Diagnosis Date  . History of colon polyps   . Hyperlipidemia   . Hypertension   . 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 S/P CABG (coronary artery bypass graft) 1992  . ICD (implantable cardiac defibrillator) in place 03/2007    July, 2008  . Chronotropic incompetence     treated w/pacemaker, ICD 7.2008. This was placed after CPX done post 2008 cath. CPX showed only chronotropic incompetence  . Arrhythmia   . CHF (congestive heart failure) ([place])   . Syncopal episodes     relative hypotension  . Mitral regurgitation     mild  . Numbness and tingling of left arm and leg     improved after tx w/prednisone  . Hx of CABG  1992  . Grief reaction   . Arthritis   . Blood transfusion 1969  . Myocardial infarction ([place])   . Ejection fraction     .    Past Surgical History  Procedure Laterality Date  . Coronary artery bypass graft  1992  . Cardiac catheterization    . Cardiac defibrillator placement  2008  . Leg surgery  1942    Fx leg repair with artificial bone  . Cataract extraction  12/14 & 1/15    right eye in December and Left eye in January  . Implantable cardioverter defibrillator (icd) generator change N/A 06/21/2014    Procedure: ICD GENERATOR CHANGE;  Surgeon: Deboraha Sprang,  MD;  Location: [facility] CATH LAB;  Service: Cardiovascular;  Laterality: N/A;    Current Medications: Outpatient Prescriptions Prior to Visit  Medication Sig Dispense Refill  . aspirin 81 MG tablet Take 1 tablet (81 mg total) by mouth daily. 30 tablet 11  . atorvastatin (LIPITOR) 40 MG tablet Take 1 tablet (40 mg total) by mouth daily. 90 tablet 2  . carvedilol (COREG) 25 MG tablet Take 1 tablet (25 mg total) by mouth 2 (two) times daily. 180 tablet 3  . Cholecalciferol 1000 UNITS tablet Take 1,000 Units by mouth daily.      . digoxin (LANOXIN) 0.125 MG tablet TAKE ONE-HALF TABLET (0.0625 MG) DAILY 45 tablet 2  . hydrochlorothiazide (MICROZIDE) 12.5 MG capsule Take 1 capsule (12.5 mg total) by mouth daily. 90 capsule 3  . losartan (COZAAR) 100 MG tablet Take 1 tablet (100 mg total) by mouth daily. 90 tablet 3  . Multiple Vitamins-Minerals (PRESERVISION AREDS 2) CAPS Take 1 capsule by mouth 2 (two) times daily.    . nitroGLYCERIN (NITROLINGUAL) 0.4 MG/SPRAY spray Place 1 spray under the tongue every 5 (five) minutes x 3 doses as needed for chest pain.      No facility-administered medications prior to visit.     Allergies:   Morphine and Ramipril   Social History   Social History  . Marital Status: Married    Spouse Name: N/A  . Number of Children: N/A  . Years of Education: N/A   Occupational History  . Retired    Social History Main Topics  . Smoking status: Former Smoker    Types: Cigars  . Smokeless tobacco: Never Used     Comment: quit 1973  . Alcohol Use: No  . Drug Use: No  . Sexual Activity: Not Asked   Other Topics Concern  . None   Social History Narrative   Widowed - 06/2010     Family History:  The patient's family history includes Colon cancer in his other; Hypertension in his father, mother, and other.   ROS:   Please see the history of present illness.    ROS All other systems reviewed and are negative.   PHYSICAL EXAM:   VS:  BP 136/70 mmHg  Pulse 70   Ht 5\' 7"  (1.702 m)  Wt 163 lb 6.4 oz (74.118 kg)  BMI 25.59 kg/m2   GEN: Well nourished, well developed, in no acute distress HEENT: normal Neck: no JVD, carotid bruits, or masses Cardiac: RRR; no murmurs, rubs, or gallops,no edema  Respiratory:  clear to auscultation bilaterally, normal work of breathing GI: soft, nontender, nondistended, + BS MS: no deformity or atrophy Skin: warm and dry, no rash Neuro:  Alert and Oriented x 3, Strength and sensation are intact Psych: euthymic mood, full affect  Wt Readings from  Last 3 Encounters:  03/25/16 163 lb 6.4 oz (74.118 kg)  01/08/16 167 lb 12.8 oz (76.114 kg)  08/01/15 169 lb 9.6 oz (76.93 kg)      Studies/Labs Reviewed:   EKG:  EKG is ordered today.  The ekg ordered today demonstrates paced rhythm  Recent Labs: 05/04/2015: TSH 2.94 07/17/2015: ALT 17 03/25/2016: BUN 23; Creat 1.25*; Hemoglobin 12.5*; Platelets 227; Potassium 5.2; Sodium 139   Lipid Panel    Component Value Date/Time   CHOL 86* 07/17/2015 1023   TRIG 145 07/17/2015 1023   HDL 25* 07/17/2015 1023   CHOLHDL 3.4 07/17/2015 1023   VLDL 29 07/17/2015 1023   LDLCALC 32 07/17/2015 1023   LDLDIRECT 50.1 04/28/2011 0827    Additional studies/ records that were reviewed today include:   Cath 02/27/2007 ASSESSMENT: 1. Severe native coronary disease as described above. 2. Two of three bypass grafts patent including the left internal  mammary artery to the left anterior descending and the saphenous  vein graft to the ramus. There is a total occlusion of the  saphenous vein graft to the right coronary artery which is chronic. 3. Moderately reduced LV function and ejection fraction approximately  35% with an apical aneurysm. 4. Mild pulmonary arterial hypertension with normal wedge pressure. 5. A 1-2+ mitral regurgitation.   St jude ICD implantation 06/21/2014 St Jude pulse generator, serial number [id number] atrial lead St Jude  [id number]   ASSESSMENT:    1. Unstable angina ([place])   2. Ischemic cardiomyopathy   3. Coronary artery disease involving coronary bypass graft of native heart without angina pectoris   4. Chronic systolic CHF (congestive heart failure) ([place])   5. Hyperlipidemia   6. Automatic implantable cardioverter-defibrillator in situ   7. Essential hypertension   8. SOB (shortness of breath)   9. OSA (obstructive sleep apnea)      PLAN:  In order of problems listed above:  1. Unstable angina: Symptom progressively worsening with exertional chest discomfort and shortness of breath. The past 2 weeks. After discussing with Dr. Acie Fredrickson, we will plan for cardiac catheterization.  Risk and benefit of procedure explained to the patient who display clear understanding and agree to proceed. Discussed with patient possible procedural risk include bleeding, vascular injury, renal injury, arrythmia, MI, stroke and loss of limb or life.  2.  CAD s/p CABG 1992: previous cath showed chronically occluded SVG to RCA, patent LIMA to LAD and patent SVG to ramus  3. ischemic cardiomyopathy s/p St Jude ICD 03/2007 w/ gen change 10/15: 41 beats run of nonsustained VT near end of May, currently undergoing ischemic workup.  4. HLD: Continue Lipitor  5. chronic systolic HF: Euvolemic on physical exam.  6.  Likely OSA: complained of loud snoring at night, also episodes of apnea noted by wife. Will arrange sleep study after cath.    Medication Adjustments/Labs and Tests Ordered: Current medicines are reviewed at length with the patient today.  Concerns regarding medicines are outlined above.  Medication changes, Labs and Tests ordered today are listed in the Patient Instructions below. Patient Instructions  Medication Instructions:  Your physician has recommended you make the following change in your medication:  1.  START Imdur 30 mg taking 1/2 tablet daily   Labwork: TODAY:  CBC W/ DIFF, BMET, &  PT/INR  Testing/Procedures: Your physician has requested that you have a cardiac catheterization. Cardiac catheterization is used to diagnose and/or treat various heart conditions. Doctors may recommend this procedure for a number of  different reasons. The most common reason is to evaluate chest pain. Chest pain can be a symptom of coronary artery disease (CAD), and cardiac catheterization can show whether plaque is narrowing or blocking your heart's arteries. This procedure is also used to evaluate the valves, as well as measure the blood flow and oxygen levels in different parts of your heart. For further information please visit [email or web address]. Please follow instruction sheet, as given.   Your physician has recommended that you have a sleep study. This test records several body functions during sleep, including: brain activity, eye movement, oxygen and carbon dioxide blood levels, heart rate and rhythm, breathing rate and rhythm, the flow of air through your mouth and nose, snoring, body muscle movements, and chest and belly movement.    Follow-Up: Your physician recommends that you schedule a follow-up appointment in:  WILL BE SET UP AT DISCHARGE   Any Other Special Instructions Will Be Listed Below (If Applicable).  Coronary Angiogram A coronary angiogram, also called coronary angiography, is an X-ray procedure used to look at the arteries in the heart. In this procedure, a dye (contrast dye) is injected through a long, hollow tube (catheter). The catheter is about the size of a piece of cooked spaghetti and is inserted through your groin, wrist, or arm. The dye is injected into each artery, and X-rays are then taken to show if there is a blockage in the arteries of your heart. LET [facility] CARE PROVIDER KNOW ABOUT:  Any allergies you have, including allergies to shellfish or contrast dye.   All medicines you are taking, including vitamins, herbs, eye drops, creams, and  over-the-counter medicines.   Previous problems you or members of your family have had with the use of anesthetics.   Any blood disorders you have.   Previous surgeries you have had.  History of kidney problems or failure.   Other medical conditions you have. RISKS AND COMPLICATIONS  Generally, a coronary angiogram is a safe procedure. However, problems can occur and include:  Allergic reaction to the dye.  Bleeding from the access site or other locations.  Kidney injury, especially in people with impaired kidney function.  Stroke (rare).  Heart attack (rare). BEFORE THE PROCEDURE   Do not eat or drink anything after midnight the night before the procedure or as directed by your health care provider.   Ask your health care provider about changing or stopping your regular medicines. This is especially important if you are taking diabetes medicines or blood thinners. PROCEDURE  You may be given a medicine to help you relax (sedative) before the procedure. This medicine is given through an intravenous (IV) access tube that is inserted into one of your veins.   The area where the catheter will be inserted will be washed and shaved. This is usually done in the groin but may be done in the fold of your arm (near your elbow) or in the wrist.   A medicine will be given to numb the area where the catheter will be inserted (local anesthetic).   The health care provider will insert the catheter into an artery. The catheter will be guided by using a special type of X-ray (fluoroscopy) of the blood vessel being examined.   A special dye will then be injected into the catheter, and X-rays will be taken. The dye will help to show where any narrowing or blockages are located in the heart arteries.  AFTER THE PROCEDURE   If the procedure is done  through the leg, you will be kept in bed lying flat for several hours. You will be instructed to not bend or cross your legs.  The  insertion site will be checked frequently.   The pulse in your feet or wrist will be checked frequently.   Additional blood tests, X-rays, and an electrocardiogram may be done.    This information is not intended to replace advice given to you by your health care provider. Make sure you discuss any questions you have with your health care provider.   Document Released: 03/08/2003 Document Revised: 09/22/2014 Document Reviewed: 01/24/2013 Elsevier Interactive Patient Education [employer].    If you need a refill on your cardiac medications before your next appointment, please call your pharmacy.       [name] [name], [place]  03/25/2016 10:42 PM    [facility] [address], [place], [place]  [address] Phone: [phone]; Fax: [phone]   I am signing this note to complete the note before his cardiac catheterization. The full visit was performed by [name] Deforest, PA.   [name] [name] 03/26/2016 8:24 AM

## 2016-03-26 NOTE — Care Management Note (Addendum)
Case Management Note  Patient Details  Name: [name] [name]. MRN: [id number] Date of Birth: 01-30-1929  Subjective/Objective:   Pt presented for Coronary intervention. Plan for home on Brilinta.                   Action/Plan: Benefits check in process and will make pt aware once completed. CM will provide pt with 30 day free card. No further needs from CM at this time.    Expected Discharge Date:                  Expected Discharge Plan:  Home/Self Care  In-House Referral:  NA  Discharge planning Services  CM Consult, Medication Assistance  Post Acute Care Choice:  NA Choice offered to:  NA  DME Arranged:  N/A DME Agency:  NA  HH Arranged:  NA HH Agency:  NA  Status of Service:  Completed, signed off  If discussed at [place] of Stay Meetings, dates discussed:    Additional Comments: [id number] 03-27-16 Brilinta 30 day free card provided. Pt will 90 day Rx sent to [facility] fr 90 day supply. Pt copay will be $24- prior auth not required. Medication is available at [facility]. No further needs from CM.  Thanks Jacqlyn Krauss, RN,BSN [phone]  Bethena Roys, RN 03/26/2016, 2:29 PM

## 2016-03-27 ENCOUNTER — Encounter (HOSPITAL_COMMUNITY): Payer: Self-pay | Admitting: Cardiovascular Disease

## 2016-03-27 DIAGNOSIS — Z885 Allergy status to narcotic agent status: Secondary | ICD-10-CM | POA: Diagnosis not present

## 2016-03-27 DIAGNOSIS — M1990 Unspecified osteoarthritis, unspecified site: Secondary | ICD-10-CM | POA: Diagnosis not present

## 2016-03-27 DIAGNOSIS — E785 Hyperlipidemia, unspecified: Secondary | ICD-10-CM

## 2016-03-27 DIAGNOSIS — I340 Nonrheumatic mitral (valve) insufficiency: Secondary | ICD-10-CM | POA: Diagnosis not present

## 2016-03-27 DIAGNOSIS — Z7982 Long term (current) use of aspirin: Secondary | ICD-10-CM | POA: Diagnosis not present

## 2016-03-27 DIAGNOSIS — I252 Old myocardial infarction: Secondary | ICD-10-CM | POA: Diagnosis not present

## 2016-03-27 DIAGNOSIS — I272 Other secondary pulmonary hypertension: Secondary | ICD-10-CM | POA: Diagnosis not present

## 2016-03-27 DIAGNOSIS — I2582 Chronic total occlusion of coronary artery: Secondary | ICD-10-CM | POA: Diagnosis not present

## 2016-03-27 DIAGNOSIS — Z955 Presence of coronary angioplasty implant and graft: Secondary | ICD-10-CM | POA: Diagnosis not present

## 2016-03-27 DIAGNOSIS — I5042 Chronic combined systolic (congestive) and diastolic (congestive) heart failure: Secondary | ICD-10-CM | POA: Diagnosis not present

## 2016-03-27 DIAGNOSIS — I110 Hypertensive heart disease with heart failure: Secondary | ICD-10-CM

## 2016-03-27 DIAGNOSIS — I255 Ischemic cardiomyopathy: Secondary | ICD-10-CM | POA: Diagnosis not present

## 2016-03-27 DIAGNOSIS — I4589 Other specified conduction disorders: Secondary | ICD-10-CM | POA: Diagnosis not present

## 2016-03-27 DIAGNOSIS — I200 Unstable angina: Secondary | ICD-10-CM | POA: Diagnosis not present

## 2016-03-27 DIAGNOSIS — I25710 Atherosclerosis of autologous vein coronary artery bypass graft(s) with unstable angina pectoris: Secondary | ICD-10-CM | POA: Diagnosis not present

## 2016-03-27 DIAGNOSIS — Z86010 Personal history of colonic polyps: Secondary | ICD-10-CM | POA: Diagnosis not present

## 2016-03-27 DIAGNOSIS — G4733 Obstructive sleep apnea (adult) (pediatric): Secondary | ICD-10-CM | POA: Diagnosis not present

## 2016-03-27 DIAGNOSIS — Z95810 Presence of automatic (implantable) cardiac defibrillator: Secondary | ICD-10-CM | POA: Diagnosis not present

## 2016-03-27 DIAGNOSIS — Z8249 Family history of ischemic heart disease and other diseases of the circulatory system: Secondary | ICD-10-CM | POA: Diagnosis not present

## 2016-03-27 DIAGNOSIS — Z87891 Personal history of nicotine dependence: Secondary | ICD-10-CM | POA: Diagnosis not present

## 2016-03-27 DIAGNOSIS — Z800 Family history of malignant neoplasm of digestive organs: Secondary | ICD-10-CM | POA: Diagnosis not present

## 2016-03-27 LAB — CBC
HEMATOCRIT: 32.9 % — AB (ref 39.0–52.0)
HEMOGLOBIN: 10.8 g/dL — AB (ref 13.0–17.0)
MCH: 28.9 pg (ref 26.0–34.0)
MCHC: 32.8 g/dL (ref 30.0–36.0)
MCV: 88 fL (ref 78.0–100.0)
Platelets: 181 10*3/uL (ref 150–400)
RBC: 3.74 MIL/uL — AB (ref 4.22–5.81)
RDW: 14.7 % (ref 11.5–15.5)
WBC: 9 10*3/uL (ref 4.0–10.5)

## 2016-03-27 LAB — BASIC METABOLIC PANEL
ANION GAP: 6 (ref 5–15)
BUN: 23 mg/dL — ABNORMAL HIGH (ref 6–20)
CO2: 25 mmol/L (ref 22–32)
Calcium: 8.6 mg/dL — ABNORMAL LOW (ref 8.9–10.3)
Chloride: 105 mmol/L (ref 101–111)
Creatinine, Ser: 1.24 mg/dL (ref 0.61–1.24)
GFR calc Af Amer: 60 mL/min — ABNORMAL LOW (ref >60)
GFR, EST NON AFRICAN AMERICAN: 52 mL/min — AB (ref >60)
Glucose, Bld: 107 mg/dL — ABNORMAL HIGH (ref 65–99)
POTASSIUM: 3.9 mmol/L (ref 3.5–5.1)
SODIUM: 136 mmol/L (ref 135–145)

## 2016-03-27 MED ORDER — ANGIOPLASTY BOOK
Freq: Once | Status: AC
  Administered 2016-03-27: 04:00:00
  Filled 2016-03-27: qty 1

## 2016-03-27 MED ORDER — TICAGRELOR 90 MG PO TABS: 90.0000 mg | ORAL_TABLET | Freq: Two times a day (BID) | ORAL | Status: DC

## 2016-03-27 MED ORDER — ISOSORBIDE MONONITRATE ER 30 MG PO TB24: 30.0000 mg | ORAL_TABLET | Freq: Every day | ORAL | Status: DC

## 2016-03-27 MED FILL — Lidocaine HCl Local Preservative Free (PF) Inj 1%: INTRAMUSCULAR | Qty: 30 | Status: AC

## 2016-03-27 MED FILL — Heparin Sodium (Porcine) 2 Unit/ML in Sodium Chloride 0.9%: INTRAMUSCULAR | Qty: 500 | Status: AC

## 2016-03-27 NOTE — Discharge Summary (Signed)
Discharge Summary    Patient ID: [name] Piana.,  MRN: [id number], DOB/AGE: [birth year] 82 y.o.  Admit date: 03/26/2016 Discharge date: 03/27/2016  Primary Care Provider: Walker Kehr Primary Cardiologist: Dr. Acie Fredrickson  Discharge Diagnoses    Active Problems:   Unstable angina [facility])   Status post coronary artery stent placement   Hypertensive heart disease with heart failure (HCC)   Hyperlipidemia   Allergies Allergies  Allergen Reactions  . Morphine     "increased white corpuscles"  . Ramipril Other (See Comments)    REACTION: cough if dose is over 2.5mg     Diagnostic Studies/Procedures  Coronary Stent Intervention Left Heart Cath and Cors/Grafts Angiography     Prox RCA lesion, 100% stenosed.  Mid LAD lesion, 100% stenosed.  LIMA was injected is normal in caliber, and is anatomically normal.  SVG was injected .  There is severe disease in the graft.  Occluded.  SVG was not injected .  Known to be occluded.  Origin lesion, 100% stenosed.  Origin lesion, 100% stenosed.  There is moderate left ventricular systolic dysfunction.  Ost Ramus to Ramus lesion, 99% stenosed. Post intervention, there is a 0% residual stenosis.  1. Severe triple vessel s/p 3V CABG with 1/3 patent bypass grafts.  2. Occluded mid LAD with patent LIMA to LAD 3. Severe stenosis in the proximal segment of the moderate caliber Ramus branch. The vein graft to this branch is now occluded.  4. Chronic occlusion mid RCA. The vein graft to this branch is known to be occluded.  5. Moderate LV systolic dysfunction, chronic.  6. Successful PTCA/DES x 2 Ramus intermediate branch.   Recommendations: Will continue ASA and Brilinta for now. He will be randomized in the TWILIGHT study (ASA/Brlinta for 3 months, then at 3 months, he will either stop ASA and continue Brilinta alone or continue both meds). Continue statin and beta blocker.    _____________   History of Present  Illness   [name] [name] is a 82 y.o. male with PMH of CAD s/p CABG 1992, HLD, chronic systolic HF and chronotropnic incompetence, ischemic cardiomyopathy s/p St Jude ICD 03/2007 w/ gen change 10/15. He had cardiac catheterization was in 2006 which showed severe native disease with total occlusion of LAD, total occlusion of ramus branch of left circumflex, total occlusion of RCA, patent SVG to ramus of the circumflex, occluded vein graft to distal RCA which appears to be chronic, patent LIMA to LAD, EF 35% with 2+ MR. He had another cardiac cath in 2008 which [place] unchanged.  Patient called the office on 03/24/16 complaining of worsening fatigue, chest tightness and shortness of breath for the past 2 weeks. He was seen in the office on 03/25/16 and still had chest discomfort which has been progressively worsening mainly with exertion.  Symptoms were very similar to the previous heart attack, but not as severe. His symptoms were very concerning for unstable progressive angina. His case was discussed with Dr. Acie Fredrickson who recommended go directly to cardiac catheterization instead of stress test. Hospital Course  He underwent left heart cath on 03/26/16, report above. He had successful PTCA and DES x 2 to the ramus intermediate branch. He has known graft occlusions, but has patent LIMA to LAD.  We will continue ASA and Brilinta for now. He will be randomized in the TWILIGHT study (ASA/Brlinta for 3 months, then at 3 months, he will either stop ASA and continue Brilinta alone or continue both meds).  He will continue his carvedilol, he has a history of chronic systolic CHF. His last EF was 35-40%, last echo was in 2014. We will continue digoxin and high intensity statin.   He ambulated with cardiac rehab without dyspnea or chest pain.   BP well controlled on Imdur and losartan. He will follow up with our office in one week. His right radial site was stable at discharge. He was seen by Dr. Oval Linsey and deemed  suitable for discharge.   _____________  Discharge Vitals Blood pressure 126/58, pulse 78, temperature 97.7 F (36.5 C), temperature source Oral, resp. rate 23, height 5\' 7"  (1.702 m), weight 163 lb 2.3 oz (74 kg), SpO2 95 %.  Filed Weights   03/26/16 0657 03/27/16 0443  Weight: 170 lb (77.111 kg) 163 lb 2.3 oz (74 kg)    Labs & Radiologic Studies     CBC  Recent Labs  03/25/16 1041 03/27/16 0327  WBC 7.6 9.0  NEUTROABS 5624  --   HGB 12.5* 10.8*  HCT 36.8* 32.9*  MCV 85.2 88.0  PLT 227 0000000   Basic Metabolic Panel  Recent Labs  03/25/16 1041 03/27/16 0327  NA 139 136  K 5.2 3.9  CL 103 105  CO2 27 25  GLUCOSE 106* 107*  BUN 23 23*  CREATININE 1.25* 1.24  CALCIUM 9.5 8.6*     Disposition   Pt is being discharged home today in good condition.  Follow-up Plans & Appointments    Follow-up Information    Follow up with Truitt Merle, NP On 04/04/2016.   Specialties:  Nurse Practitioner, Interventional Cardiology, Cardiology, Radiology   Why:  at 8:30 am for follow up   Contact information:   [address] [place] [address] [phone]      Discharge Instructions    Amb Referral to Cardiac Rehabilitation    Complete by:  As directed   Diagnosis:   PTCA Coronary Stents       Diet - low sodium heart healthy    Complete by:  As directed      Discharge instructions    Complete by:  As directed   NO HEAVY LIFTING OR SEXUAL ACTIVITY for 7 DAYS. NO DRIVING for 3-5 DAYS. NO SOAKING BATHS, HOT TUBS, POOLS, ETC., for 7 DAYS   Radial Site Care Refer to this sheet in the next few weeks. These instructions provide you with information on caring for yourself after your procedure. Your caregiver may also give you more specific instructions. Your treatment has been planned according to current medical practices, but problems sometimes occur. Call your caregiver if you have any problems or questions after your procedure. HOME CARE  INSTRUCTIONS You may shower the day after the procedure.Remove the bandage (dressing) and gently wash the site with plain soap and water.Gently pat the site dry.  Do not apply powder or lotion to the site.  Do not submerge the affected site in water for 3 to 5 days.  Inspect the site at least twice daily.  Do not flex or bend the affected arm for 24 hours.  No lifting over 5 pounds (2.3 kg) for 5 days after your procedure.  Do not drive home if you are discharged the same day of the procedure. Have someone else drive you.  You may drive 24 hours after the procedure unless otherwise instructed by your caregiver.  What to expect: Any bruising will usually fade within 1 to 2 weeks.  Blood that collects in  the tissue (hematoma) may be painful to the touch. It should usually decrease in size and tenderness within 1 to 2 weeks.  SEEK IMMEDIATE MEDICAL CARE IF: You have unusual pain at the radial site.  You have redness, warmth, swelling, or pain at the radial site.  You have drainage (other than a small amount of blood on the dressing).  You have chills.  You have a fever or persistent symptoms for more than 72 hours.  You have a fever and your symptoms suddenly get worse.  Your arm becomes pale, cool, tingly, or numb.  You have heavy bleeding from the site. Hold pressure on the site.     Increase activity slowly    Complete by:  As directed            Discharge Medications   Current Discharge Medication List    START taking these medications   Details  !! ticagrelor (BRILINTA) 90 MG TABS tablet Take 1 tablet (90 mg total) by mouth 2 (two) times daily. Qty: 60 tablet, Refills: 12    !! ticagrelor (BRILINTA) 90 MG TABS tablet Take 1 tablet (90 mg total) by mouth 2 (two) times daily. Qty: 60 tablet, Refills: 0     !! - Potential duplicate medications found. Please discuss with provider.    CONTINUE these medications which have CHANGED   Details  isosorbide mononitrate (IMDUR) 30  MG 24 hr tablet Take 1 tablet (30 mg total) by mouth daily. Qty: 30 tablet, Refills: 12      CONTINUE these medications which have NOT CHANGED   Details  aspirin 81 MG tablet Take 1 tablet (81 mg total) by mouth daily. Qty: 30 tablet, Refills: 11    atorvastatin (LIPITOR) 40 MG tablet Take 1 tablet (40 mg total) by mouth daily. Qty: 90 tablet, Refills: 2    carvedilol (COREG) 25 MG tablet Take 1 tablet (25 mg total) by mouth 2 (two) times daily. Qty: 180 tablet, Refills: 3    Cholecalciferol 1000 UNITS tablet Take 1,000 Units by mouth daily.      digoxin (LANOXIN) 0.125 MG tablet TAKE ONE-HALF TABLET (0.0625 MG) DAILY Qty: 45 tablet, Refills: 2    hydrochlorothiazide (MICROZIDE) 12.5 MG capsule Take 1 capsule (12.5 mg total) by mouth daily. Qty: 90 capsule, Refills: 3   Associated Diagnoses: Automatic implantable cardioverter-defibrillator in situ; Chronic systolic CHF (congestive heart failure) ([place]); Chronotropic incompetence; Ischemic cardiomyopathy    losartan (COZAAR) 100 MG tablet Take 1 tablet (100 mg total) by mouth daily. Qty: 90 tablet, Refills: 3    Multiple Vitamins-Minerals (PRESERVISION AREDS 2) CAPS Take 1 capsule by mouth 2 (two) times daily.    nitroGLYCERIN (NITROLINGUAL) 0.4 MG/SPRAY spray Place 1 spray under the tongue every 5 (five) minutes x 3 doses as needed for chest pain.          Aspirin prescribed at discharge?  Yes High Intensity Statin Prescribed? Yes Beta Blocker Prescribed? Yes For EF 45% or less, Was ACEI/ARB Prescribed? Yes ADP Receptor Inhibitor Prescribed? Yes For EF <40%, Aldosterone Inhibitor Prescribed? No Was EF assessed during THIS hospitalization? No  Was Cardiac Rehab II ordered? Yes   Outstanding Labs/Studies    Duration of Discharge Encounter   Greater than 30 minutes including physician time.  Signed, Arbutus Leas NP 03/27/2016, 12:32 PM

## 2016-03-27 NOTE — Progress Notes (Signed)
Patient Name: [name] [name]. Date of Encounter: 03/27/2016  Active Problems:   Unstable angina [facility])   Primary Cardiologist: Dr. Acie Fredrickson Patient Profile: [name] [name]. is a 82 y.o. male with PMH of CAD s/p CABG 1992, HLD, chronic systolic HF and chronotropnic incompetence, ischemic cardiomyopathy s/p St Jude ICD 03/2007 w/ gen change 10/15.Patient called the office yesterday morning complaining of worsening fatigue, chest tightness and shortness of breath for the past 2 weeks. He is still chest discomfort has been progressively worsening mainly with exertion. He also noticed dyspnea on exertion as well. Symptom is similar to the previous heart attack but not as severe. He was admitted from the office for left heart cath. He received DES x 2 to ramus intermediate branch.   SUBJECTIVE: Feels well, denies chest pain and SOB. Has walked with cardiac rehab and did well.   OBJECTIVE Filed Vitals:   03/26/16 2000 03/26/16 2030 03/27/16 0443 03/27/16 0753  BP: 118/57 131/45 107/45 131/54  Pulse: 70 72 60 73  Temp:   98.1 F (36.7 C) 98.1 F (36.7 C)  TempSrc:   Oral Oral  Resp: 19 17 18 18   Height:      Weight:   163 lb 2.3 oz (74 kg)   SpO2: 97% 97% 94% 96%    Intake/Output Summary (Last 24 hours) at 03/27/16 0908 Last data filed at 03/27/16 0756  Gross per 24 hour  Intake 1162.5 ml  Output   2050 ml  Net -887.5 ml   Filed Weights   03/26/16 0657 03/27/16 0443  Weight: 170 lb (77.111 kg) 163 lb 2.3 oz (74 kg)    PHYSICAL EXAM General: Well developed, well nourished, male in no acute distress. Head: Normocephalic, atraumatic.  Neck: Supple without bruits, no JVD. Lungs:  Resp regular and unlabored, CTA. Heart: RRR, S1, S2, no S3, S4, or murmur; no rub. Abdomen: Soft, non-tender, non-distended, BS + x 4.  Extremities: No clubbing, cyanosis, no edema.  Neuro: Alert and oriented X 3. Moves all extremities spontaneously. Psych: Normal affect.  LABS: CBC: Recent Labs  03/25/16 1041 03/27/16 0327  WBC 7.6 9.0  NEUTROABS 5624  --   HGB 12.5* 10.8*  HCT 36.8* 32.9*  MCV 85.2 88.0  PLT 227 181   INR: Recent Labs  03/25/16 1041  INR 1.0   Basic Metabolic Panel: Recent Labs  03/25/16 1041 03/27/16 0327  NA 139 136  K 5.2 3.9  CL 103 105  CO2 27 25  GLUCOSE 106* 107*  BUN 23 23*  CREATININE 1.25* 1.24  CALCIUM 9.5 8.6*     Current facility-administered medications:  .  0.9 %  sodium chloride infusion, 250 mL, Intravenous, PRN, Burnell Blanks, MD .  acetaminophen (TYLENOL) tablet 650 mg, 650 mg, Oral, Q4H PRN, Burnell Blanks, MD .  aspirin chewable tablet 81 mg, 81 mg, Oral, Daily, Burnell Blanks, MD .  atorvastatin (LIPITOR) tablet 40 mg, 40 mg, Oral, q1800, Burnell Blanks, MD, 40 mg at 03/26/16 1655 .  carvedilol (COREG) tablet 25 mg, 25 mg, Oral, BID, Burnell Blanks, MD, 25 mg at 03/26/16 2157 .  cholecalciferol (VITAMIN D) tablet 1,000 Units, 1,000 Units, Oral, Daily, Burnell Blanks, MD, 1,000 Units at 03/26/16 1355 .  digoxin (LANOXIN) tablet 0.0625 mg, 0.0625 mg, Oral, Daily, Burnell Blanks, MD .  isosorbide mononitrate (IMDUR) 24 hr tablet 30 mg, 30 mg, Oral, Daily, Burnell Blanks, MD, 30 mg at 03/26/16  1350 .  losartan (COZAAR) tablet 100 mg, 100 mg, Oral, Daily, Burnell Blanks, MD, 100 mg at 03/26/16 1354 .  multivitamin with minerals tablet 1 tablet, 1 tablet, Oral, Daily, Burnell Blanks, MD .  ondansetron [facility]) injection 4 mg, 4 mg, Intravenous, Q6H PRN, Burnell Blanks, MD .  sodium chloride flush (NS) 0.9 % injection 3 mL, 3 mL, Intravenous, Q12H, Burnell Blanks, MD, 3 mL at 03/26/16 2159 .  sodium chloride flush (NS) 0.9 % injection 3 mL, 3 mL, Intravenous, PRN, Burnell Blanks, MD .  ticagrelor [facility]) tablet 90 mg, 90 mg, Oral, BID, Burnell Blanks, MD, 90 mg at 03/26/16 2157    TELE: Atrial paced        Coronary Stent Intervention Left Heart Cath and Cors/Grafts Angiography 03/26/16  Prox RCA lesion, 100% stenosed.  Mid LAD lesion, 100% stenosed.  LIMA was injected is normal in caliber, and is anatomically normal.  SVG was injected .  There is severe disease in the graft.  Occluded.  SVG was not injected .  Known to be occluded.  Origin lesion, 100% stenosed.  Origin lesion, 100% stenosed.  There is moderate left ventricular systolic dysfunction.  Ost Ramus to Ramus lesion, 99% stenosed. Post intervention, there is a 0% residual stenosis.  1. Severe triple vessel s/p 3V CABG with 1/3 patent bypass grafts.  2. Occluded mid LAD with patent LIMA to LAD 3. Severe stenosis in the proximal segment of the moderate caliber Ramus branch. The vein graft to this branch is now occluded.  4. Chronic occlusion mid RCA. The vein graft to this branch is known to be occluded.  5. Moderate LV systolic dysfunction, chronic.  6. Successful PTCA/DES x 2 Ramus intermediate branch.   Recommendations: Will continue ASA and Brilinta for now. He will be randomized in the TWILIGHT study (ASA/Brlinta for 3 months, then at 3 months, he will either stop ASA and continue Brilinta alone or continue both meds). Continue statin and beta blocker.    Current Medications:  . aspirin  81 mg Oral Daily  . atorvastatin  40 mg Oral q1800  . carvedilol  25 mg Oral BID  . cholecalciferol  1,000 Units Oral Daily  . digoxin  0.0625 mg Oral Daily  . isosorbide mononitrate  30 mg Oral Daily  . losartan  100 mg Oral Daily  . multivitamin with minerals  1 tablet Oral Daily  . sodium chloride flush  3 mL Intravenous Q12H  . ticagrelor  90 mg Oral BID      ASSESSMENT AND PLAN: Active Problems:   Unstable angina ([place])  1. Unstable angina: Left heart cath yesterday, report above. PTCA and DES x 2 to ramus intermediate branch. He will be randomized in the TWILIGHT study, with ASA and Brilinta for 3  months, then either will continue ASA+Brilinta or continue Brilinta alone.   Continue carvedilol.   2. CAD s/p CABG: Has history of CABG, graft angiography showed 1/3 patent grafts yesterday. He is on long acting nitrates and moderate intensity statin. Continue same.   3. HTN: Well controlled on beta blocker, ARB, and isosorbide.   4. HLD: LDL was 32 in Nov. 2016, continue moderate intensity statin.   5. Chronic systolic CHF: Euvolemic on exam, last EF was 35-40% in 2014. Can repeat Echo outpatient.       Signed, Arbutus Leas , NP 9:08 AM 03/27/2016 Pager [phone]

## 2016-03-27 NOTE — Progress Notes (Signed)
CARDIAC REHAB PHASE I   PRE:  Rate/Rhythm: 70 apaced    BP: sitting 131/54    SaO2:   MODE:  Ambulation: 1000 ft   POST:  Rate/Rhythm: 119 vpacing    BP: sitting 138/60     SaO2:   Tolerated well, no c/o walking. On return to room, pts HR up 119 with V pacing (had been a paced before walk). Pt sat and stated some SOB once sitting. HR slowed with rest, returned to apacing. Ed completed with good reception. Pt is active and enjoys walking with wife. Gave instructions to weigh daily and watch sodium to avoid HF. Gave HF booklet. Understands importance of taking meds/Twilight study. Will send referral for CRPII to [facility] although pt prefers to exercise on his own. [place], ACSM 03/27/2016 8:49 AM

## 2016-03-28 ENCOUNTER — Telehealth: Payer: Self-pay | Admitting: Cardiovascular Disease

## 2016-03-28 NOTE — Telephone Encounter (Signed)
Returning a call about some test results . Please call  Thanks

## 2016-03-28 NOTE — Telephone Encounter (Signed)
Reviewed lab results and plan of care from [place], [place].  Patient verbalized understanding and agreement and thanked me for the call.  He states he is feeling well since d/c from hospital.

## 2016-04-04 ENCOUNTER — Ambulatory Visit (INDEPENDENT_AMBULATORY_CARE_PROVIDER_SITE_OTHER): Payer: Medicare Other | Admitting: Nurse Practitioner

## 2016-04-04 ENCOUNTER — Encounter: Payer: Self-pay | Admitting: Nurse Practitioner

## 2016-04-04 ENCOUNTER — Encounter (INDEPENDENT_AMBULATORY_CARE_PROVIDER_SITE_OTHER): Payer: Self-pay

## 2016-04-04 VITALS — BP 140/62 | HR 70 | Ht 67.0 in | Wt 164.6 lb

## 2016-04-04 DIAGNOSIS — I255 Ischemic cardiomyopathy: Secondary | ICD-10-CM

## 2016-04-04 DIAGNOSIS — I25810 Atherosclerosis of coronary artery bypass graft(s) without angina pectoris: Secondary | ICD-10-CM

## 2016-04-04 DIAGNOSIS — I5022 Chronic systolic (congestive) heart failure: Secondary | ICD-10-CM | POA: Diagnosis not present

## 2016-04-04 DIAGNOSIS — Z955 Presence of coronary angioplasty implant and graft: Secondary | ICD-10-CM | POA: Diagnosis not present

## 2016-04-04 DIAGNOSIS — E785 Hyperlipidemia, unspecified: Secondary | ICD-10-CM

## 2016-04-04 LAB — BASIC METABOLIC PANEL
BUN: 19 mg/dL (ref 7–25)
CO2: 25 mmol/L (ref 20–31)
Calcium: 8.8 mg/dL (ref 8.6–10.3)
Chloride: 101 mmol/L (ref 98–110)
Creat: 1.22 mg/dL — ABNORMAL HIGH (ref 0.70–1.11)
Glucose, Bld: 109 mg/dL — ABNORMAL HIGH (ref 65–99)
Potassium: 4.6 mmol/L (ref 3.5–5.3)
Sodium: 136 mmol/L (ref 135–146)

## 2016-04-04 LAB — CBC
HCT: 35.1 % — ABNORMAL LOW (ref 38.5–50.0)
Hemoglobin: 11.9 g/dL — ABNORMAL LOW (ref 13.2–17.1)
MCH: 29 pg (ref 27.0–33.0)
MCHC: 33.9 g/dL (ref 32.0–36.0)
MCV: 85.6 fL (ref 80.0–100.0)
MPV: 10.6 fL (ref 7.5–12.5)
Platelets: 227 10*3/uL (ref 140–400)
RBC: 4.1 MIL/uL — ABNORMAL LOW (ref 4.20–5.80)
RDW: 14.4 % (ref 11.0–15.0)
WBC: 8.1 10*3/uL (ref 3.8–10.8)

## 2016-04-04 NOTE — Patient Instructions (Addendum)
We will be checking the following labs today - BMET and CBC   Medication Instructions:    Continue with your current medicines.     Testing/Procedures To Be Arranged:  N/A  Follow-Up:   See Dr. Acie Fredrickson or Scott/Vin in 6 weeks    Other Special Instructions:   Ok to gradually resume your activities.   Avoid the heat   If you need a refill on your cardiac medications before your next appointment, please call your pharmacy.   Call the [place] office at [phone] if you have any questions, problems or concerns.

## 2016-04-04 NOTE — Progress Notes (Signed)
CARDIOLOGY OFFICE NOTE  Date:  04/04/2016    [name] [name]. Date of Birth: 1929/01/07 Medical Record [id number]  PCP:  [name] Kehr, MD  Cardiologist:  Nahser   Chief Complaint  Patient presents with  . Coronary Artery Disease    Post PCI - seen for Dr. Acie Fredrickson    History of Present Illness: [name] [name]. is a 82 y.o. male who presents today for an approximate 10 day check - post hospital visit. Seen for Dr. Acie Fredrickson.   He has a PMH of CAD s/p CABG 1992, HLD, chronic systolic HF and chronotropnic incompetence, ischemic cardiomyopathy s/p St Jude ICD 03/2007 w/ gen change 10/15. He had cardiac catheterization was in 2006 which showed severe native disease with total occlusion of LAD, total occlusion of ramus branch of left circumflex, total occlusion of RCA, patent SVG to ramus of the circumflex, occluded vein graft to distal RCA which appears to be chronic, patent LIMA to LAD, EF 35% with 2+ MR. He had another cardiac cath in 2008 which was unchanged. He had an episode of syncope in the past related to hypotension. Per Dr. Elmarie Shiley note, despite later having hypertension, He was hesitant to increase ramipril due to his syncope in the past. Losartan was added to his medication. His blood pressure was better controlled, therefore he was left on both ACE inhibitor and ARB. Ramipril was later stopped, he is currently on Coreg and losartan.   Seen about 10 days ago with unstable angina. Referred for cardiac catheterization. Outpatient sleep study was to be arranged. Also noted to have had a 41 beat of NSVT at the end of May - no shock.   Referred on for cardiac cath - has had PCI with DES x 2 to the ramus intermediate.  He is to continue with ASA and Brilinta for now. He was randomized in the TWILIGHT study (ASA/Brlinta for 3 months, then at 3 months, he will either stop ASA and continue Brilinta alone or continue both meds). Continue statin and beta blocker.   Comes back today. Here  alone. He tells me he is not in any study. He is on Brilinta. He is paying for his prescription. Says he is able to afford. Feels much better. No more chest pain. Energy level improved. No shocks. Not short of breath. He is very happy with how he is doing. Wanting to resume his activities - he and his wife have several horses that they take care of together.   Past Medical History  Diagnosis Date  . History of colon polyps   . Hyperlipidemia   . Hypertension   . 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 Chronotropic incompetence     treated w/pacemaker, ICD 7.2008. This was placed after CPX done post 2008 cath. CPX showed only chronotropic incompetence  . Arrhythmia   . CHF (congestive heart failure) ([place])   . Syncopal episodes     relative hypotension  . Mitral regurgitation     mild  . Numbness and tingling of left arm and leg     improved after tx w/prednisone  . Grief reaction   . History of blood transfusion 1969    "after getting MSO4; it destroys my corpuscles" (03/26/2016)  . Ejection fraction     .  Marland Kitchen  AICD (automatic cardioverter/defibrillator) present   . Myocardial infarction ([place]) "several"  . Migraine     "just before my bypass" (03/26/2016)  . Arthritis     "hands, legs" (03/26/2016)    Past Surgical History  Procedure Laterality Date  . Coronary artery bypass graft  1992    CABG "X4"  . Cardiac catheterization  1980s-1990s X 4  . Cardiac defibrillator placement  2008  . Tibia fracture surgery Left 1943    "put artificial bone in there"  . Cataract extraction w/ intraocular lens  implant, bilateral Bilateral 08/2013 - 09/2013    right - left   . Implantable cardioverter defibrillator (icd) generator change N/A 06/21/2014    Procedure: ICD GENERATOR CHANGE;  Surgeon: Deboraha Sprang, MD;   Location[facility] CATH LAB;  Service: Cardiovascular;  Laterality: N/A;  . Back surgery    . Posterior lumbar fusion  ~ 1989    "fused 2 discs"  . Coronary angioplasty with stent placement  ~ 1992; 03/26/2016    "1 + 2"  . Cystoscopy  1980s  . Cardiac catheterization N/A 03/26/2016    Procedure: Left Heart Cath and Cors/Grafts Angiography;  Surgeon: Burnell Blanks, MD;  Location: [facility] LAB;  Service: Cardiovascular;  Laterality: N/A;  . Cardiac catheterization N/A 03/26/2016    Procedure: Coronary Stent Intervention;  Surgeon: Burnell Blanks, MD;  Location: [facility] LAB;  Service: Cardiovascular;  Laterality: N/A;     Medications: Current Outpatient Prescriptions  Medication Sig Dispense Refill  . aspirin 81 MG tablet Take 1 tablet (81 mg total) by mouth daily. 30 tablet 11  . atorvastatin (LIPITOR) 40 MG tablet Take 1 tablet (40 mg total) by mouth daily. 90 tablet 2  . carvedilol (COREG) 25 MG tablet Take 1 tablet (25 mg total) by mouth 2 (two) times daily. 180 tablet 3  . Cholecalciferol 1000 UNITS tablet Take 1,000 Units by mouth daily.      . digoxin (LANOXIN) 0.125 MG tablet TAKE ONE-HALF TABLET (0.0625 MG) DAILY 45 tablet 2  . hydrochlorothiazide (MICROZIDE) 12.5 MG capsule Take 1 capsule (12.5 mg total) by mouth daily. 90 capsule 3  . isosorbide mononitrate (IMDUR) 30 MG 24 hr tablet Take 15 mg by mouth daily.    Marland Kitchen losartan (COZAAR) 100 MG tablet Take 1 tablet (100 mg total) by mouth daily. 90 tablet 3  . Multiple Vitamins-Minerals (PRESERVISION AREDS 2) CAPS Take 1 capsule by mouth 2 (two) times daily.    . nitroGLYCERIN (NITROLINGUAL) 0.4 MG/SPRAY spray Place 1 spray under the tongue every 5 (five) minutes x 3 doses as needed for chest pain.     . ticagrelor (BRILINTA) 90 MG TABS tablet Take 1 tablet (90 mg total) by mouth 2 (two) times daily. 60 tablet 12   No current facility-administered medications for this visit.    Allergies: Allergies  Allergen  Reactions  . Morphine     "increased white corpuscles"  . Ramipril Other (See Comments)    REACTION: cough if dose is over 2.5mg     Social History: The patient  reports that he has quit smoking. His smoking use included Cigars. He has never used smokeless tobacco. He reports that he drinks alcohol. He reports that he does not use illicit drugs.   Family History: The patient's family history includes Colon cancer in his other; Hypertension in his father, mother, and other.   Review of Systems: Please see the history of present illness.   Otherwise, the  review of systems is positive for none.   All other systems are reviewed and negative.   Physical Exam: VS:  BP 140/62 mmHg  Pulse 70  Ht 5\' 7"  (1.702 m)  Wt 164 lb 9.6 oz (74.662 kg)  BMI 25.77 kg/m2 .  BMI Body mass index is 25.77 kg/(m^2).  Wt Readings from Last 3 Encounters:  04/04/16 164 lb 9.6 oz (74.662 kg)  03/27/16 163 lb 2.3 oz (74 kg)  03/25/16 163 lb 6.4 oz (74.118 kg)    General: Pleasant. Well developed, well nourished and in no acute distress.  HEENT: Normal. Neck: Supple, no JVD, carotid bruits, or masses noted.  Cardiac: Regular rate and rhythm. No murmurs, rubs, or gallops. No edema.  Respiratory:  Lungs are clear to auscultation bilaterally with normal work of breathing.  GI: Soft and nontender.  MS: No deformity or atrophy. Gait and ROM intact. Skin: Warm and dry. Color is normal.  Neuro:  Strength and sensation are intact and no gross focal deficits noted.  Psych: Alert, appropriate and with normal affect. Left wrist cath site looks fine.   LABORATORY DATA:  EKG:  EKG is ordered today. This demonstrates A pacing. Septal Q's.  Lab Results  Component Value Date   WBC 9.0 03/27/2016   HGB 10.8* 03/27/2016   HCT 32.9* 03/27/2016   PLT 181 03/27/2016   GLUCOSE 107* 03/27/2016   CHOL 86* 07/17/2015   TRIG 145 07/17/2015   HDL 25* 07/17/2015   LDLDIRECT 50.1 04/28/2011   LDLCALC 32 07/17/2015   ALT  17 07/17/2015   AST 15 07/17/2015   NA 136 03/27/2016   K 3.9 03/27/2016   CL 105 03/27/2016   CREATININE 1.24 03/27/2016   BUN 23* 03/27/2016   CO2 25 03/27/2016   TSH 2.94 05/04/2015   PSA 2.17 07/29/2013   INR 1.0 03/25/2016   HGBA1C 6.2 02/13/2010    BNP (last 3 results) No results for input(s): BNP in the last 8760 hours.  ProBNP (last 3 results) No results for input(s): PROBNP in the last 8760 hours.   Other Studies Reviewed Today:  Procedures    Coronary Stent Intervention   Left Heart Cath and Cors/Grafts Angiography    Conclusion     Prox RCA lesion, 100% stenosed.  Mid LAD lesion, 100% stenosed.  LIMA was injected is normal in caliber, and is anatomically normal.  SVG was injected .  There is severe disease in the graft.  Occluded.  SVG was not injected .  Known to be occluded.  Origin lesion, 100% stenosed.  Origin lesion, 100% stenosed.  There is moderate left ventricular systolic dysfunction.  Ost Ramus to Ramus lesion, 99% stenosed. Post intervention, there is a 0% residual stenosis.  1. Severe triple vessel s/p 3V CABG with 1/3 patent bypass grafts.  2. Occluded mid LAD with patent LIMA to LAD 3. Severe stenosis in the proximal segment of the moderate caliber Ramus branch. The vein graft to this branch is now occluded.  4. Chronic occlusion mid RCA. The vein graft to this branch is known to be occluded.  5. Moderate LV systolic dysfunction, chronic.  6. Successful PTCA/DES x 2 Ramus intermediate branch.   Recommendations: Will continue ASA and Brilinta for now. He will be randomized in the TWILIGHT study (ASA/Brlinta for 3 months, then at 3 months, he will either stop ASA and continue Brilinta alone or continue both meds). Continue statin and beta blocker.     Assessment/Plan:  1. Unstable angina:  s/p recent cardiac cath with successful PTCA/DES x 2 Ramus intermediate branch. Symptoms have improved significantly. Ok to  gradually resume his activities. He is on DAPT.   2. CAD s/p CABG 1992: recent cath as noted above.   3. Ichemic cardiomyopathy s/p St Jude ICD 03/2007 w/ gen change 10/15: 41 beats run of nonsustained VT near end of May, now s/p cath with PCP. No shocks or palpitations noted.   4. HLD: Continue Lipitor  5. Chronic systolic HF: Euvolemic on physical exam.  6. Likely OSA:  For sleep study next month  Current medicines are reviewed with the patient today.  The patient does not have concerns regarding medicines other than what has been noted above.  The following changes have been made:  See above.  Labs/ tests ordered today include:    Orders Placed This Encounter  Procedures  . Basic metabolic panel  . CBC  . EKG 12-Lead     Disposition:   FU with Dr. Acie Fredrickson and team in 6 weeks.    Patient is agreeable to this plan and will call if any problems develop in the interim.   Signed: Burtis Junes, RN, ANP-C 04/04/2016 8:44 AM  [facility] [address] [place], [place]  [address] Phone: [phone] Fax: [phone]

## 2016-04-08 ENCOUNTER — Telehealth: Payer: Self-pay | Admitting: Physician Assistant

## 2016-04-08 NOTE — Telephone Encounter (Signed)
Hi [name] [name],  This was routed to me by [facility]. I think it was supposed to go to you though. Looks like you called pt for lab results.

## 2016-04-30 ENCOUNTER — Ambulatory Visit (INDEPENDENT_AMBULATORY_CARE_PROVIDER_SITE_OTHER): Payer: Medicare Other | Admitting: *Deleted

## 2016-04-30 DIAGNOSIS — Z95810 Presence of automatic (implantable) cardiac defibrillator: Secondary | ICD-10-CM

## 2016-04-30 DIAGNOSIS — I255 Ischemic cardiomyopathy: Secondary | ICD-10-CM | POA: Diagnosis not present

## 2016-04-30 NOTE — Progress Notes (Signed)
Remote ICD transmission.   

## 2016-05-01 ENCOUNTER — Encounter: Payer: Self-pay | Admitting: Cardiology

## 2016-05-02 LAB — CUP PACEART REMOTE DEVICE CHECK
Battery Remaining Longevity: 67 mo
Battery Remaining Percentage: 80 %
Brady Statistic AP VS Percent: 90 %
Brady Statistic AS VP Percent: 1 %
Brady Statistic RA Percent Paced: 93 %
Date Time Interrogation Session: 20170816060015
HIGH POWER IMPEDANCE MEASURED VALUE: 73 Ohm
HighPow Impedance: 73 Ohm
Implantable Lead Implant Date: 20080711
Implantable Lead Location: 753859
Lead Channel Impedance Value: 390 Ohm
Lead Channel Pacing Threshold Amplitude: 0.5 V
Lead Channel Pacing Threshold Pulse Width: 0.5 ms
Lead Channel Sensing Intrinsic Amplitude: 12 mV
Lead Channel Setting Pacing Amplitude: 1.5 V
Lead Channel Setting Sensing Sensitivity: 0.5 mV
MDC IDC LEAD IMPLANT DT: 20080711
MDC IDC LEAD LOCATION: 753860
MDC IDC LEAD MODEL: 7122
MDC IDC MSMT BATTERY VOLTAGE: 2.96 V
MDC IDC MSMT LEADCHNL RA IMPEDANCE VALUE: 360 Ohm
MDC IDC MSMT LEADCHNL RA SENSING INTR AMPL: 2.2 mV
MDC IDC MSMT LEADCHNL RV PACING THRESHOLD AMPLITUDE: 0.75 V
MDC IDC MSMT LEADCHNL RV PACING THRESHOLD PULSEWIDTH: 0.5 ms
MDC IDC PG SERIAL: 7225183
MDC IDC SET LEADCHNL RV PACING AMPLITUDE: 2.5 V
MDC IDC SET LEADCHNL RV PACING PULSEWIDTH: 0.5 ms
MDC IDC STAT BRADY AP VP PERCENT: 5.1 %
MDC IDC STAT BRADY AS VS PERCENT: 5.2 %
MDC IDC STAT BRADY RV PERCENT PACED: 5.1 %

## 2016-05-03 ENCOUNTER — Encounter (HOSPITAL_COMMUNITY): Payer: Self-pay | Admitting: *Deleted

## 2016-05-03 ENCOUNTER — Emergency Department (HOSPITAL_COMMUNITY)
Admission: EM | Admit: 2016-05-03 | Discharge: 2016-05-03 | Disposition: A | Discharge status: Home / Self Care | Payer: Medicare Other | Attending: Emergency Medicine | Admitting: Emergency Medicine

## 2016-05-03 DIAGNOSIS — Z7982 Long term (current) use of aspirin: Secondary | ICD-10-CM | POA: Insufficient documentation

## 2016-05-03 DIAGNOSIS — T63441A Toxic effect of venom of bees, accidental (unintentional), initial encounter: Secondary | ICD-10-CM | POA: Insufficient documentation

## 2016-05-03 DIAGNOSIS — M549 Dorsalgia, unspecified: Secondary | ICD-10-CM | POA: Diagnosis not present

## 2016-05-03 DIAGNOSIS — I130 Hypertensive heart and chronic kidney disease with heart failure and stage 1 through stage 4 chronic kidney disease, or unspecified chronic kidney disease: Secondary | ICD-10-CM | POA: Insufficient documentation

## 2016-05-03 DIAGNOSIS — N183 Chronic kidney disease, stage 3 (moderate): Secondary | ICD-10-CM | POA: Insufficient documentation

## 2016-05-03 DIAGNOSIS — I2510 Atherosclerotic heart disease of native coronary artery without angina pectoris: Secondary | ICD-10-CM | POA: Insufficient documentation

## 2016-05-03 DIAGNOSIS — I509 Heart failure, unspecified: Secondary | ICD-10-CM | POA: Insufficient documentation

## 2016-05-03 DIAGNOSIS — Z87891 Personal history of nicotine dependence: Secondary | ICD-10-CM | POA: Insufficient documentation

## 2016-05-03 DIAGNOSIS — R002 Palpitations: Secondary | ICD-10-CM | POA: Diagnosis not present

## 2016-05-03 DIAGNOSIS — Z79899 Other long term (current) drug therapy: Secondary | ICD-10-CM | POA: Diagnosis not present

## 2016-05-03 MED ORDER — DEXAMETHASONE SODIUM PHOSPHATE 4 MG/ML IJ SOLN
8.0000 mg | Freq: Once | INTRAMUSCULAR | Status: AC
  Administered 2016-05-03: 8 mg via INTRAMUSCULAR
  Filled 2016-05-03: qty 2

## 2016-05-03 MED ORDER — LORATADINE 10 MG PO TABS: 10.0000 mg | ORAL_TABLET | Freq: Every day | ORAL | 0 refills | Status: DC

## 2016-05-03 MED ORDER — FAMOTIDINE 40 MG PO TABS: 40.0000 mg | ORAL_TABLET | Freq: Every day | ORAL | 0 refills | Status: DC

## 2016-05-03 MED ORDER — LORATADINE 10 MG PO TABS: 10.0000 mg | ORAL_TABLET | Freq: Every day | ORAL | Status: DC

## 2016-05-03 MED ORDER — LORATADINE 10 MG PO TABS
10.0000 mg | ORAL_TABLET | Freq: Once | ORAL | Status: AC
  Administered 2016-05-03: 10 mg via ORAL
  Filled 2016-05-03: qty 1

## 2016-05-03 NOTE — ED Notes (Signed)
Pt made aware to return if symptoms worsen or if any life threatening symptoms occur.   

## 2016-05-03 NOTE — ED Provider Notes (Signed)
[facility] DEPT Provider Note   CSN: [id number] Arrival date & time: 05/03/16  [id number]     History   Chief Complaint Chief Complaint  Patient presents with  . Insect Bite    HPI [name] [name] is a 82 y.o. male.  Patient is an 82 year old male who presents to the emergency department following multiple walls stings.  The patient states that while working outside he was stung by multiple on walks. He denies history of allergy to wasp obese. It is of note that he has cardiac arrhythmias, coronary artery disease and he has a home defibrillator with the cardioverter on, and his family was concerned because of his being stung by the bees. The patient denies any shortness of breath, and Ace facial swelling, any mouth swelling. Patient is not had any problem swallowing, his been no loss of bowel or bladder function or any other function, and in particular there is been no chest pain.   The history is provided by the patient and the spouse.    Past Medical History:  Diagnosis Date  . AICD (automatic cardioverter/defibrillator) present   . Arrhythmia   . Arthritis    "hands, legs" (03/26/2016)  . 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 CHF (congestive heart failure) ([place])   . Chronotropic incompetence    treated w/pacemaker, ICD 7.2008. This was placed after CPX done post 2008 cath. CPX showed only chronotropic incompetence  . Ejection fraction    .  Marland Kitchen Grief reaction   . History of blood transfusion 1969   "after getting MSO4; it destroys my corpuscles" (03/26/2016)  . History of colon polyps   . Hyperlipidemia   . Hypertension   . Migraine    "just before my bypass" (03/26/2016)  . Mitral regurgitation    mild  . Myocardial infarction ([place]) "several"  . Numbness and tingling of left arm and leg    improved after tx w/prednisone  . Syncopal episodes    relative hypotension    Patient Active Problem List   Diagnosis Date Noted  . Status post coronary artery stent placement   . Hypertensive heart disease with heart failure ([place])   . Hyperlipidemia   . Unstable angina ([place])   . Thumb pain 05/04/2015  . Chronic systolic CHF (congestive heart failure) ([place]) 11/18/2013  . Ejection fraction   . CAD (coronary artery disease)   . Well adult exam 07/13/2012  . Chronic renal insufficiency, stage III (moderate) 07/21/2011  . Ischemic cardiomyopathy   . Hx of CABG   . Chronotropic incompetence   . Syncopal episodes   . Mitral regurgitation   . Numbness and tingling of left arm and leg   . PARESTHESIA 10/22/2009  . TOBACCO USE, QUIT 10/22/2009  . SEBACEOUS CYST 08/27/2009  . Anemia, chronic disease 02/18/2008  . Dyslipidemia 05/03/2007  . Hypertensive heart disease 05/03/2007  . COLONIC POLYPS, HX OF 05/03/2007  . Automatic implantable cardioverter-defibrillator in situ 03/16/2007    Past Surgical History:  Procedure Laterality Date  . BACK SURGERY    . CARDIAC CATHETERIZATION  1980s-1990s X 4  . CARDIAC CATHETERIZATION N/A 03/26/2016   Procedure: Left Heart Cath and Cors/Grafts Angiography;  Surgeon: Burnell Blanks, MD;  Location: [facility];  Service: Cardiovascular;  Laterality: N/A;  . CARDIAC CATHETERIZATION N/A 03/26/2016  Procedure: Coronary Stent Intervention;  Surgeon: Burnell Blanks, MD;  Location: [facility];  Service: Cardiovascular;  Laterality: N/A;  . CARDIAC DEFIBRILLATOR PLACEMENT  2008  . CATARACT EXTRACTION W/ INTRAOCULAR LENS  IMPLANT, BILATERAL Bilateral 08/2013 - 09/2013   right - left   . CORONARY ANGIOPLASTY WITH STENT PLACEMENT  ~ 1992; 03/26/2016   "1 + 2"  . CORONARY ARTERY BYPASS GRAFT  1992   CABG "X4"  . CYSTOSCOPY  1980s  . IMPLANTABLE CARDIOVERTER DEFIBRILLATOR (ICD) GENERATOR CHANGE N/A 06/21/2014   Procedure: ICD  GENERATOR CHANGE;  Surgeon: Deboraha Sprang, MD;  Location: [facility] CATH LAB;  Service: Cardiovascular;  Laterality: N/A;  . POSTERIOR LUMBAR FUSION  ~ 1989   "fused 2 discs"  . TIBIA FRACTURE SURGERY Left 1943   "put artificial bone in there"       Home Medications    Prior to Admission medications   Medication Sig Start Date End Date Taking? Authorizing Provider  aspirin 81 MG tablet Take 1 tablet (81 mg total) by mouth daily. 07/22/12   Deboraha Sprang, MD  atorvastatin (LIPITOR) 40 MG tablet Take 1 tablet (40 mg total) by mouth daily. 10/01/15   Thayer Headings, MD  carvedilol (COREG) 25 MG tablet Take 1 tablet (25 mg total) by mouth 2 (two) times daily. 08/13/15   Thayer Headings, MD  Cholecalciferol 1000 UNITS tablet Take 1,000 Units by mouth daily.      Historical Provider, MD  digoxin (LANOXIN) 0.125 MG tablet TAKE ONE-HALF TABLET (0.0625 MG) DAILY 10/02/15   Thayer Headings, MD  hydrochlorothiazide (MICROZIDE) 12.5 MG capsule Take 1 capsule (12.5 mg total) by mouth daily. 08/01/15   Deboraha Sprang, MD  isosorbide mononitrate (IMDUR) 30 MG 24 hr tablet Take 15 mg by mouth daily.    Historical Provider, MD  losartan (COZAAR) 100 MG tablet Take 1 tablet (100 mg total) by mouth daily. 10/19/15   Thayer Headings, MD  Multiple Vitamins-Minerals (PRESERVISION AREDS 2) CAPS Take 1 capsule by mouth 2 (two) times daily.    Historical Provider, MD  nitroGLYCERIN (NITROLINGUAL) 0.4 MG/SPRAY spray Place 1 spray under the tongue every 5 (five) minutes x 3 doses as needed for chest pain.     Historical Provider, MD  ticagrelor (BRILINTA) 90 MG TABS tablet Take 1 tablet (90 mg total) by mouth 2 (two) times daily. 03/27/16   Arbutus Leas, NP    Family History Family History  Problem Relation Age of Onset  . Hypertension Mother   . Hypertension Father   . Hypertension Other   . Colon cancer Other     1st degree relative <50    Social History Social History  Substance Use Topics  . Smoking status:  Former Smoker    Years: 29.00    Types: Cigars  . Smokeless tobacco: Never Used     Comment: quit smoking cigars in 1973  . Alcohol use Yes     Comment: "quit drinking in 1973"     Allergies   Morphine and Ramipril   Review of Systems Review of Systems  Constitutional: Negative for activity change.       All ROS Neg except as noted in HPI  HENT: Negative for nosebleeds.   Eyes: Negative for photophobia and discharge.  Respiratory: Negative for cough, chest tightness, shortness of breath and wheezing.   Cardiovascular: Positive for palpitations. Negative for chest pain.  Gastrointestinal: Negative for abdominal pain and blood in  stool.  Genitourinary: Negative for dysuria, frequency and hematuria.  Musculoskeletal: Positive for arthralgias and back pain. Negative for neck pain.  Skin: Negative.   Neurological: Negative for dizziness, seizures and speech difficulty.  Psychiatric/Behavioral: Negative for confusion and hallucinations.     Physical Exam Updated Vital Signs BP 126/71 (BP Location: Right Arm)   Pulse 70   Temp 98.5 F (36.9 C) (Oral)   Resp 16   Ht 5\' 6"  (1.676 m)   Wt 76.2 kg   SpO2 96%   BMI 27.12 kg/m   Physical Exam  Constitutional: Vital signs are normal. He appears well-developed and well-nourished. He is active.  HENT:  Head: Normocephalic and atraumatic.  Right Ear: Tympanic membrane and ear canal normal.  Left Ear: Tympanic membrane and ear canal normal.  Nose: Nose normal.  Mouth/Throat: Uvula is midline, oropharynx is clear and moist and mucous membranes are normal.  Eyes: Conjunctivae, EOM and lids are normal. Pupils are equal, round, and reactive to light.  Neck: Trachea normal, normal range of motion and phonation normal. Neck supple. Carotid bruit is not present.  Cardiovascular: Normal rate, regular rhythm and normal pulses.   Murmur heard. Pulmonary/Chest: He has no wheezes. He has no rales.  Abdominal: Soft. Normal appearance and  bowel sounds are normal. He exhibits no distension and no mass. There is no tenderness. There is no guarding.  Lymphadenopathy:       Head (right side): No submental, no preauricular and no posterior auricular adenopathy present.       Head (left side): No submental, no preauricular and no posterior auricular adenopathy present.    He has no cervical adenopathy.  Neurological: He is alert. He has normal strength. No cranial nerve deficit or sensory deficit. GCS eye subscore is 4. GCS verbal subscore is 5. GCS motor subscore is 6.  Skin: Skin is warm and dry.  Multiple insect stings on the ear, back, abd, and upper extremities. No hives noted.  Psychiatric: His speech is normal.     ED Treatments / Results  Labs (all labs ordered are listed, but only abnormal results are displayed) Labs Reviewed - No data to display  EKG  EKG Interpretation None       Radiology No results found.  Procedures Procedures (including critical care time)  Medications Ordered in ED Medications  dexamethasone (DECADRON) injection 8 mg (not administered)     Initial Impression / Assessment and Plan / ED Course  I have reviewed the triage vital signs and the nursing notes.  Pertinent labs & imaging results that were available during my care of the patient were reviewed by me and considered in my medical decision making (see chart for details).  Clinical Course    **I have reviewed nursing notes, vital signs, and all appropriate lab and imaging results for this patient.*  Final Clinical Impressions(s) / ED Diagnoses  The vital signs are well within normal limits. The pulse oximetry is 96% on room air. Within normal limits by my interpretation. No difficulty with swelling. No SOB present. Pt will be treated with claritin and pepcid. He has side effects with prednisone. Pt to return to the ED immediately if any changes or problem. Pt and wife in agreement with this problem.   Final diagnoses:    Bee sting, accidental or unintentional, initial encounter    New Prescriptions New Prescriptions   No medications on file     Lily Kocher, PA-C 05/03/16 [place], DO  05/06/16 1516  

## 2016-05-03 NOTE — ED Triage Notes (Addendum)
Pt comes in for bee stings on his left cheek, right ear, and numerous spots on his abdomen. This occurred at [facility] today. Pt denies any trouble breathing. Theses areas have minor swelling noted to them.

## 2016-05-03 NOTE — Discharge Instructions (Signed)
Your were treated with steroids in the Emergency Dept. Please use claritin and pepcid daily to control histamines following your wasp stings. Please return to the Emergency Dept immediately if any changes in condition or problems.

## 2016-05-11 ENCOUNTER — Ambulatory Visit (HOSPITAL_BASED_OUTPATIENT_CLINIC_OR_DEPARTMENT_OTHER): Payer: Medicare Other | Attending: Physician Assistant | Admitting: Cardiology

## 2016-05-11 VITALS — Ht 67.0 in | Wt 169.0 lb

## 2016-05-11 DIAGNOSIS — I493 Ventricular premature depolarization: Secondary | ICD-10-CM | POA: Insufficient documentation

## 2016-05-11 DIAGNOSIS — G4733 Obstructive sleep apnea (adult) (pediatric): Secondary | ICD-10-CM | POA: Insufficient documentation

## 2016-05-15 NOTE — Progress Notes (Signed)
Cardiology Office Note:    Date:  05/16/2016   ID:  [name] [name], DOB Nov 27, 1928, MRN [id number]  PCP:  [name] Kehr, MD  Cardiologist:  Dr. Liam [name]   Electrophysiologist:  Dr. Virl [name]   Referring MD: [name] Marion Evie Lacks, MD   Chief Complaint  Patient presents with  . Follow-up    CAD    History of Present Illness:    [name] [name] is a 82 y.o. male with a hx of CAD status post CABG in 0000000, systolic CHF, ischemic cardiomyopathy, chronotropic incompetence, status post St. Jude Pacer/ICD 7/08 with generator change 10/15, prior syncope related to hypotension, NSVT. He was admitted in 7/17 with unstable angina. LHC demonstrated patent LIMA-LAD, occluded SVG-lateral ramus, occluded SVG-distal RCA and severe ostial ramus intermediate stenosis. He underwent PCI with DES 2 to the RI. Last seen by [name] Merle, NP 04/04/16.   He returns for follow-up. He is here alone today. He is doing well. 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 He notes dyspnea with more moderate to extreme activities. He denies chest discomfort. He denies syncope. He denies ICD discharges. He denies orthopnea, PND or significant pedal edema.   Prior CV studies that were reviewed today include:    LHC 03/26/16 LAD mid 100% CTO RI ostial 99% LCx okay RCA proximal 100% LIMA-LAD okay SVG-lateral ramus 100% SVG-distal RCA 100% CTO EF 35-45% PCI: 2.25 x 28 and 2.25 x 16 mm Promus Premier DES to the RI 1. Severe triple vessel s/p 3V CABG with 1/3 patent bypass grafts.  2. Occluded mid LAD with patent LIMA to LAD 3. Severe stenosis in the proximal segment of the moderate caliber Ramus branch. The vein graft to this branch is now occluded.  4. Chronic occlusion mid RCA. The vein graft to this branch is known to be occluded.  5. Moderate LV systolic dysfunction, chronic.  6. Successful PTCA/DES x 2 Ramus intermediate branch.           Echo 9/14 Mild  LVH, mild focal basal septal hypertrophy, EF 35-40%, anteroseptal HK, normal diastolic function, mild MR, mild LAE, PASP 34 mmHg    Past Medical History:  Diagnosis Date  . AICD (automatic cardioverter/defibrillator) present   . Arrhythmia   . Arthritis    "hands, legs" (03/26/2016)  . 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 Chronic systolic CHF (congestive heart failure) ([place])    a. Echo 9/14: Mild LVH, mild focal basal septal hypertrophy, EF 35-40%, anteroseptal HK, normal diastolic function, mild MR, mild LAE, PASP 34 mmHg  . Chronotropic incompetence    treated w/pacemaker, ICD 7.2008. This was placed after CPX done post 2008 cath. CPX showed only chronotropic incompetence  . History of blood transfusion 1969   "after getting MSO4; it destroys my corpuscles" (03/26/2016)  . History of colon polyps   . Hyperlipidemia   . Hypertension   . Migraine    "just before my bypass" (03/26/2016)  . Mitral regurgitation    mild  . Myocardial infarction ([place]) "several"  . Numbness and tingling of left arm and leg  improved after tx w/prednisone  . Syncopal episodes    relative hypotension    Past Surgical History:  Procedure Laterality Date  . BACK SURGERY    . CARDIAC CATHETERIZATION  1980s-1990s X 4  . CARDIAC CATHETERIZATION N/A 03/26/2016   Procedure: Left Heart Cath and Cors/Grafts Angiography;  Surgeon: [name] Blanks, MD;  Location: [facility];  Service: Cardiovascular;  Laterality: N/A;  . CARDIAC CATHETERIZATION N/A 03/26/2016   Procedure: Coronary Stent Intervention;  Surgeon: [name] Blanks, MD;  Location: [facility];  Service: Cardiovascular;  Laterality: N/A;  .  CARDIAC DEFIBRILLATOR PLACEMENT  2008  . CATARACT EXTRACTION W/ INTRAOCULAR LENS  IMPLANT, BILATERAL Bilateral 08/2013 - 09/2013   right - left   . CORONARY ANGIOPLASTY WITH STENT PLACEMENT  ~ 1992; 03/26/2016   "1 + 2"  . CORONARY ARTERY BYPASS GRAFT  1992   CABG "X4"  . CYSTOSCOPY  1980s  . IMPLANTABLE CARDIOVERTER DEFIBRILLATOR (ICD) GENERATOR CHANGE N/A 06/21/2014   Procedure: ICD GENERATOR CHANGE;  Surgeon: [name] Sprang, MD;  Location: [facility];  Service: Cardiovascular;  Laterality: N/A;  . POSTERIOR LUMBAR FUSION  ~ 1989   "fused 2 discs"  . TIBIA FRACTURE SURGERY Left 1943   "put artificial bone in there"    Current Medications: Outpatient Medications Prior to Visit  Medication Sig Dispense Refill  . aspirin 81 MG tablet Take 1 tablet (81 mg total) by mouth daily. 30 tablet 11  . atorvastatin (LIPITOR) 40 MG tablet Take 1 tablet (40 mg total) by mouth daily. 90 tablet 2  . carvedilol (COREG) 25 MG tablet Take 1 tablet (25 mg total) by mouth 2 (two) times daily. 180 tablet 3  . Cholecalciferol 1000 UNITS tablet Take 1,000 Units by mouth daily.      . digoxin (LANOXIN) 0.125 MG tablet TAKE ONE-HALF TABLET (0.0625 MG) DAILY 45 tablet 2  . famotidine (PEPCID) 40 MG tablet Take 1 tablet (40 mg total) by mouth daily. For wasp stings 5 tablet 0  . hydrochlorothiazide (MICROZIDE) 12.5 MG capsule Take 1 capsule (12.5 mg total) by mouth daily. 90 capsule 3  . isosorbide mononitrate (IMDUR) 30 MG 24 hr tablet Take 15 mg by mouth daily.    Marland Kitchen loratadine (CLARITIN) 10 MG tablet Take 1 tablet (10 mg total) by mouth daily. 10 tablet 0  . losartan (COZAAR) 100 MG tablet Take 1 tablet (100 mg total) by mouth daily. 90 tablet 3  . Multiple Vitamins-Minerals (PRESERVISION AREDS 2) CAPS Take 1 capsule by mouth 2 (two) times daily.    . nitroGLYCERIN (NITROLINGUAL) 0.4 MG/SPRAY spray Place 1 spray under the tongue every 5 (five) minutes x 3 doses as needed for chest pain.     . ticagrelor  (BRILINTA) 90 MG TABS tablet Take 1 tablet (90 mg total) by mouth 2 (two) times daily. 60 tablet 12   No facility-administered medications prior to visit.       Allergies:   Morphine and Ramipril   Social History   Social History  . Marital status: Married    Spouse name: N/A  . Number of children: N/A  . Years of education: N/A   Occupational History  . Retired    Social History Main Topics  . Smoking status: Former Smoker    Years: 29.00    Types: Cigars  . Smokeless tobacco: Never Used     Comment: quit smoking cigars in 1973  . Alcohol use Yes  Comment: "quit drinking in 1973"  . Drug use: No  . Sexual activity: Yes   Other Topics Concern  . None   Social History Narrative   Widowed - 06/2010     Family History:  The patient's family history includes Colon cancer in his other; Hypertension in his father, mother, and other.   ROS:   Please see the history of present illness.    Review of Systems  Cardiovascular: Positive for dyspnea on exertion.  Musculoskeletal: Positive for joint swelling and myalgias.   All other systems reviewed and are negative.   EKGs/Labs/Other Test Reviewed:    EKG:  EKG is   ordered today.  The ekg ordered today demonstrates Atrial paced, HR 84, PR 232 ms, anteroseptal Q waves, LVH, QTc 399 ms, nonspecific ST-T wave changes, similar to prior tracing  Recent Labs: 07/17/2015: ALT 17 04/04/2016: BUN 19; Creat 1.22; Hemoglobin 11.9; Platelets 227; Potassium 4.6; Sodium 136   Recent Lipid Panel    Component Value Date/Time   CHOL 86 (L) 07/17/2015 1023   TRIG 145 07/17/2015 1023   HDL 25 (L) 07/17/2015 1023   CHOLHDL 3.4 07/17/2015 1023   VLDL 29 07/17/2015 1023   LDLCALC 32 07/17/2015 1023   LDLDIRECT 50.1 04/28/2011 0827     Physical Exam:    VS:  BP (!) 118/50   Pulse 84   Ht 5\' 7"  (1.702 m)   Wt 162 lb (73.5 kg)   BMI 25.37 kg/m     Wt Readings from Last 3 Encounters:  05/16/16 162 lb (73.5 kg)  05/11/16 169  lb (76.7 kg)  05/03/16 168 lb (76.2 kg)     Physical Exam  Constitutional: He is oriented to person, place, and time. He appears well-developed and well-nourished. No distress.  HENT:  Head: Normocephalic and atraumatic.  Eyes: No scleral icterus.  Neck: Normal range of motion. No JVD present.  Cardiovascular: Normal rate, regular rhythm, S1 normal and S2 normal.   No murmur heard. Pulmonary/Chest: Effort normal and breath sounds normal. He has no wheezes. He has no rhonchi. He has no rales.  Abdominal: Soft. There is no tenderness.  Musculoskeletal: He exhibits edema.  Trace bilateral LE edema  Neurological: He is alert and oriented to person, place, and time.  Skin: Skin is warm and dry.  Psychiatric: He has a normal mood and affect.    ASSESSMENT:    1. Coronary artery disease involving coronary bypass graft of native heart without angina pectoris   2. Chronic systolic CHF (congestive heart failure) ([place])   3. Automatic implantable cardioverter-defibrillator in situ   4. Hyperlipidemia    PLAN:    In order of problems listed above:  1. CAD - He has undergone prior CABG. Recent cardiac catheterization with 2 out of 3 grafts patent. He underwent PCI with DES 2 to the native ramus intermediate. He continues to do well without anginal symptoms. He understands the importance of dual antiplatelet therapy. He has been adherent with aspirin and Brilinta. Continue aspirin, Brilinta, Lipitor, Coreg, isosorbide, losartan.  FU with Dr. Acie Fredrickson in 4 mos with routine labs at that point.   2. Chronic systolic CHF - Secondary to ischemic cardiomyopathy.  EF 35-40%. Volume is stable. He is NYHA 2. He remains on an excellent medical regimen which includes beta blocker, digoxin, nitrates, ARB.  3. Status post ICD - He has a recent history of NSVT. No therapies were delivered. This was in the setting of unstable angina. Continue follow-up  with EP as planned.  4. HL - Continue statin. LDL in  11/16 was 32.   Medication Adjustments/Labs and Tests Ordered: Current medicines are reviewed at length with the patient today.  Concerns regarding medicines are outlined above.  Medication changes, Labs and Tests ordered today are outlined in the Patient Instructions noted below. Patient Instructions  Medication Instructions:  Your physician recommends that you continue on your current medications as directed. Please refer to the Current Medication list given to you today.  Labwork: You have a FASTING lab appointment scheduled 1/4 (the same day as your visit with Dr. Acie Fredrickson). Testing/Procedures: None Follow-Up: You have an appointment scheduled with Dr. Caryl Comes on 11/17 at 11:45 AM. You have an appointment with Dr. Acie Fredrickson 09/18/16 at 9:30 AM. Any Other Special Instructions Will Be Listed Below (If Applicable). If you need a refill on your cardiac medications before your next appointment, please call your pharmacy.  Signed, Richardson Dopp, PA-C  05/16/2016 10:53 AM    [facility] [address], [place], [place]  [address] Phone: [phone]; Fax: [phone]

## 2016-05-16 ENCOUNTER — Encounter: Payer: Self-pay | Admitting: Physician Assistant

## 2016-05-16 ENCOUNTER — Encounter (INDEPENDENT_AMBULATORY_CARE_PROVIDER_SITE_OTHER): Payer: Self-pay

## 2016-05-16 ENCOUNTER — Ambulatory Visit (INDEPENDENT_AMBULATORY_CARE_PROVIDER_SITE_OTHER): Payer: Medicare Other | Admitting: Physician Assistant

## 2016-05-16 VITALS — BP 118/50 | HR 84 | Ht 67.0 in | Wt 162.0 lb

## 2016-05-16 DIAGNOSIS — Z95810 Presence of automatic (implantable) cardiac defibrillator: Secondary | ICD-10-CM

## 2016-05-16 DIAGNOSIS — I5022 Chronic systolic (congestive) heart failure: Secondary | ICD-10-CM | POA: Diagnosis not present

## 2016-05-16 DIAGNOSIS — I25810 Atherosclerosis of coronary artery bypass graft(s) without angina pectoris: Secondary | ICD-10-CM | POA: Diagnosis not present

## 2016-05-16 DIAGNOSIS — E785 Hyperlipidemia, unspecified: Secondary | ICD-10-CM | POA: Diagnosis not present

## 2016-05-16 NOTE — Patient Instructions (Addendum)
Medication Instructions:  Your physician recommends that you continue on your current medications as directed. Please refer to the Current Medication list given to you today.  Labwork: You have a FASTING lab appointment scheduled 1/4 (the same day as your visit with Dr. Acie Fredrickson). Testing/Procedures: None Follow-Up: You have an appointment scheduled with Dr. Caryl Comes on 11/17 at 11:45 AM. You have an appointment with Dr. Acie Fredrickson 09/18/16 at 9:30 AM. Any Other Special Instructions Will Be Listed Below (If Applicable). If you need a refill on your cardiac medications before your next appointment, please call your pharmacy.

## 2016-05-18 NOTE — Procedures (Signed)
Patient Name: [name] [name], [name] [name] Date: 05/11/2016 Gender: Male D.O.B: [birth year] Age (years): 89 Referring Provider: Almyra Deforest PA Height (inches): 31 Interpreting Physician: Fransico Him MD, ABSM Weight (lbs): 169 RPSGT: Zadie Rhine BMI: 26 MRN: [id number] Neck Size: 16.00  CLINICAL INFORMATION Sleep Study Type: Split Night CPAP Indication for sleep study: OSA   SLEEP STUDY TECHNIQUE As per the AASM Manual for the Scoring of Sleep and Associated Events v2.3 (April 2016) with a hypopnea requiring 4% desaturations. The channels recorded and monitored were frontal, central and occipital EEG, electrooculogram (EOG), submentalis EMG (chin), nasal and oral airflow, thoracic and abdominal wall motion, anterior tibialis EMG, snore microphone, electrocardiogram, and pulse oximetry. Continuous positive airway pressure (CPAP) was initiated when the patient met split night criteria and was titrated according to treat sleep-disordered breathing.  MEDICATIONS Medications taken by the patient : Reviewed in the chart Medications administered by patient during sleep study : No sleep medicine administered.  RESPIRATORY PARAMETERS Diagnostic Total AHI (/hr): 38.5 RDI (/hr): 52.8 OA Index (/hr): 9.3 CA Index (/hr): 0.5 REM AHI (/hr): 9.3  NREM AHI (/hr):54.0  Supine AHI (/hr):55.0  Non-supine AHI (/hr):36.77 Min O2 Sat (%):89.00  Mean O2 (%):94.34  Time below 88% (min):0.0      Titration Optimal Pressure (cm):N/A  AHI at Optimal Pressure (/hr):N/A  Min O2 at Optimal Pressure (%):N/A Supine % at Optimal (%):N/A  Sleep % at Optimal (%):N/A      SLEEP ARCHITECTURE The recording time for the entire night was 421.4 minutes. During a baseline period of 193.3 minutes, the patient slept for 129.5 minutes in REM and nonREM, yielding a sleep efficiency of 67.0%. Sleep onset after lights out was 28.2 minutes with a REM latency of 95.0 minutes. The patient spent 10.04% of the night in stage N1 sleep,  55.21% in stage N2 sleep, 0.00% in stage N3 and 34.75% in REM. During the titration period of 225.3 minutes, the patient slept for 161.0 minutes in REM and nonREM, yielding a sleep efficiency of 71.4%. Sleep onset after CPAP initiation was 4.2 minutes with a REM latency of 78.5 minutes. The patient spent 8.39% of the night in stage N1 sleep, 64.29% in stage N2 sleep, 0.00% in stage N3 and 27.33% in REM.  CARDIAC DATA The 2 lead EKG demonstrated pacemaker generated. The mean heart rate was 62.90 beats per minute. Other EKG findings include: PVCs.  LEG MOVEMENT DATA The total Periodic Limb Movements of Sleep (PLMS) were 71. The PLMS index was 14.66 .  IMPRESSIONS - Severe obstructive sleep apnea occurred during the diagnostic portion of the study (AHI = 38.5/hour). An optimal PAP pressure could not be selected due to onging respiratory events despite CPAP at 18cm H2O. - No significant central sleep apnea occurred during the diagnostic portion of the study (CAI = 0.5/hour). - The patient had minimal or no oxygen desaturation during the diagnostic portion of the study (Min O2 = 89.00%) - No snoring was audible during the diagnostic portion of the study. - EKG findings include PVCs. - Mild periodic limb movements of sleep occurred during the study.  DIAGNOSIS - Obstructive Sleep Apnea (327.23 [G47.33 ICD-10])  RECOMMENDATIONS - As patient could not be adequately titrated on CPAP due to ongoing respiratory events, recommend repeat in lab titration with BIPAP. - Avoid alcohol, sedatives and other CNS depressants that may worsen sleep apnea and disrupt normal sleep architecture. - Sleep hygiene should be reviewed to assess factors that may improve sleep quality. - Weight management and  regular exercise should be initiated or continued.  ELECTRONICALLY SIGNED ON:  05/18/2016, 5:26 PM [place] PH: [phone]   FX: [phone] [place]

## 2016-05-20 ENCOUNTER — Telehealth: Payer: Self-pay | Admitting: Physician Assistant

## 2016-05-20 NOTE — Progress Notes (Signed)
Left message to call back for sleep study results.  

## 2016-05-20 NOTE — Telephone Encounter (Signed)
New message ° ° ° ° ° °Returning a call to get lab results °

## 2016-05-20 NOTE — Telephone Encounter (Signed)
Follow up  Pt voiced he is calling about his results.  Please follow up with pt. Thanks!

## 2016-05-20 NOTE — Telephone Encounter (Signed)
Patient is calling about sleep study. Will forward to [place].

## 2016-05-23 NOTE — Telephone Encounter (Signed)
Abby calling and documenting in another encounter.  Please see sleep study notes

## 2016-05-23 NOTE — Progress Notes (Signed)
05/23/2016  Left message for patient to call back for sleep study results.

## 2016-05-26 ENCOUNTER — Other Ambulatory Visit: Payer: Self-pay

## 2016-05-26 DIAGNOSIS — G4733 Obstructive sleep apnea (adult) (pediatric): Secondary | ICD-10-CM

## 2016-05-26 NOTE — Progress Notes (Signed)
05/26/16  Spoke with patient and advised him of sleep study results and Dr.Turners recommendations. Patient verbalized understanding and is in agreement with treatment plan. I will schedule bipap titration and mail letter. Patient understands that if appt date and time doesn't work for him, he is to call the number on the letter and reschedule to a time that works better for him.

## 2016-06-02 DIAGNOSIS — H524 Presbyopia: Secondary | ICD-10-CM | POA: Diagnosis not present

## 2016-06-02 DIAGNOSIS — H43811 Vitreous degeneration, right eye: Secondary | ICD-10-CM | POA: Diagnosis not present

## 2016-06-12 ENCOUNTER — Encounter: Payer: Self-pay | Admitting: Internal Medicine

## 2016-06-24 ENCOUNTER — Ambulatory Visit (INDEPENDENT_AMBULATORY_CARE_PROVIDER_SITE_OTHER): Payer: Medicare Other | Admitting: Internal Medicine

## 2016-06-24 ENCOUNTER — Encounter: Payer: Self-pay | Admitting: Internal Medicine

## 2016-06-24 DIAGNOSIS — I25810 Atherosclerosis of coronary artery bypass graft(s) without angina pectoris: Secondary | ICD-10-CM

## 2016-06-24 DIAGNOSIS — E785 Hyperlipidemia, unspecified: Secondary | ICD-10-CM

## 2016-06-24 DIAGNOSIS — Z23 Encounter for immunization: Secondary | ICD-10-CM

## 2016-06-24 NOTE — Assessment & Plan Note (Signed)
Lipitor 

## 2016-06-24 NOTE — Assessment & Plan Note (Signed)
Chronic On Ramipril, Losartan, Lipitor, Coreg, ASA per Cardiology NTG prn 

## 2016-06-24 NOTE — Progress Notes (Signed)
Subjective:  Patient ID: [name] [name], male    DOB: Aug 05, 1929  Age: 82 y.o. MRN: [id number]  CC: No chief complaint on file.   HPI [name] [name] presents for CAD, HTN, dyslipidemia f/u  Outpatient Medications Prior to Visit  Medication Sig Dispense Refill  . aspirin 81 MG tablet Take 1 tablet (81 mg total) by mouth daily. 30 tablet 11  . atorvastatin (LIPITOR) 40 MG tablet Take 1 tablet (40 mg total) by mouth daily. 90 tablet 2  . carvedilol (COREG) 25 MG tablet Take 1 tablet (25 mg total) by mouth 2 (two) times daily. 180 tablet 3  . Cholecalciferol 1000 UNITS tablet Take 1,000 Units by mouth daily.      . digoxin (LANOXIN) 0.125 MG tablet TAKE ONE-HALF TABLET (0.0625 MG) DAILY 45 tablet 2  . famotidine (PEPCID) 40 MG tablet Take 1 tablet (40 mg total) by mouth daily. For wasp stings 5 tablet 0  . hydrochlorothiazide (MICROZIDE) 12.5 MG capsule Take 1 capsule (12.5 mg total) by mouth daily. 90 capsule 3  . isosorbide mononitrate (IMDUR) 30 MG 24 hr tablet Take 15 mg by mouth daily.    Marland Kitchen loratadine (CLARITIN) 10 MG tablet Take 1 tablet (10 mg total) by mouth daily. 10 tablet 0  . losartan (COZAAR) 100 MG tablet Take 1 tablet (100 mg total) by mouth daily. 90 tablet 3  . Multiple Vitamins-Minerals (PRESERVISION AREDS 2) CAPS Take 1 capsule by mouth 2 (two) times daily.    . nitroGLYCERIN (NITROLINGUAL) 0.4 MG/SPRAY spray Place 1 spray under the tongue every 5 (five) minutes x 3 doses as needed for chest pain.     . ticagrelor (BRILINTA) 90 MG TABS tablet Take 1 tablet (90 mg total) by mouth 2 (two) times daily. 60 tablet 12   No facility-administered medications prior to visit.     ROS Review of Systems  Constitutional: Positive for fatigue. Negative for appetite change and unexpected weight change.  HENT: Negative for congestion, nosebleeds, sneezing, sore throat and trouble swallowing.   Eyes: Negative for itching and visual disturbance.  Respiratory: Negative for cough and  wheezing.   Cardiovascular: Negative for chest pain, palpitations and leg swelling.  Gastrointestinal: Negative for abdominal distention, blood in stool, diarrhea and nausea.  Genitourinary: Negative for frequency and hematuria.  Musculoskeletal: Negative for back pain, gait problem, joint swelling and neck pain.  Skin: Negative for rash.  Neurological: Negative for dizziness, tremors, speech difficulty and weakness.  Psychiatric/Behavioral: Negative for agitation, dysphoric mood and sleep disturbance. The patient is not nervous/anxious.     Objective:  BP 134/70   Pulse 70   Temp 97.8 F (36.6 C) (Oral)   Wt 166 lb (75.3 kg)   SpO2 96%   BMI 26.00 kg/m   BP Readings from Last 3 Encounters:  06/24/16 134/70  05/16/16 (!) 118/50  05/03/16 126/71    Wt Readings from Last 3 Encounters:  06/24/16 166 lb (75.3 kg)  05/16/16 162 lb (73.5 kg)  05/11/16 169 lb (76.7 kg)    Physical Exam  Constitutional: He is oriented to person, place, and time. He appears well-developed. No distress.  NAD  HENT:  Mouth/Throat: Oropharynx is clear and moist.  Eyes: Conjunctivae are normal. Pupils are equal, round, and reactive to light.  Neck: Normal range of motion. No JVD present. No thyromegaly present.  Cardiovascular: Normal rate, regular rhythm, normal heart sounds and intact distal pulses.  Exam reveals no gallop and no friction rub.  No murmur heard. Pulmonary/Chest: Effort normal and breath sounds normal. No respiratory distress. He has no wheezes. He has no rales. He exhibits no tenderness.  Abdominal: Soft. Bowel sounds are normal. He exhibits no distension and no mass. There is no tenderness. There is no rebound and no guarding.  Musculoskeletal: Normal range of motion. He exhibits no edema or tenderness.  Lymphadenopathy:    He has no cervical adenopathy.  Neurological: He is alert and oriented to person, place, and time. He has normal reflexes. No cranial nerve deficit. He  exhibits normal muscle tone. He displays a negative Romberg sign. Coordination and gait normal.  Skin: Skin is warm and dry. No rash noted.  Psychiatric: He has a normal mood and affect. His behavior is normal. Judgment and thought content normal.    Lab Results  Component Value Date   WBC 8.1 04/04/2016   HGB 11.9 (L) 04/04/2016   HCT 35.1 (L) 04/04/2016   PLT 227 04/04/2016   GLUCOSE 109 (H) 04/04/2016   CHOL 86 (L) 07/17/2015   TRIG 145 07/17/2015   HDL 25 (L) 07/17/2015   LDLDIRECT 50.1 04/28/2011   LDLCALC 32 07/17/2015   ALT 17 07/17/2015   AST 15 07/17/2015   NA 136 04/04/2016   K 4.6 04/04/2016   CL 101 04/04/2016   CREATININE 1.22 (H) 04/04/2016   BUN 19 04/04/2016   CO2 25 04/04/2016   TSH 2.94 05/04/2015   PSA 2.17 07/29/2013   INR 1.0 03/25/2016   HGBA1C 6.2 02/13/2010    No results found.  Assessment & Plan:   There are no diagnoses linked to this encounter. I am having [name] [name] maintain his nitroGLYCERIN, Cholecalciferol, aspirin, PRESERVISION AREDS 2, hydrochlorothiazide, carvedilol, atorvastatin, digoxin, losartan, ticagrelor, isosorbide mononitrate, loratadine, and famotidine.  No orders of the defined types were placed in this encounter.    Follow-up: No Follow-up on file.  Walker Kehr, MD

## 2016-06-24 NOTE — Progress Notes (Signed)
Pre visit review using our clinic review tool, if applicable. No additional management support is needed unless otherwise documented below in the visit note. 

## 2016-06-24 NOTE — Addendum Note (Signed)
Addended by: Cresenciano Lick on: 06/24/2016 03:56 PM   Modules accepted: Orders

## 2016-06-26 ENCOUNTER — Ambulatory Visit (HOSPITAL_BASED_OUTPATIENT_CLINIC_OR_DEPARTMENT_OTHER): Payer: Medicare Other | Attending: Cardiology | Admitting: Cardiology

## 2016-06-26 VITALS — Ht 67.0 in | Wt 169.0 lb

## 2016-06-26 DIAGNOSIS — G4733 Obstructive sleep apnea (adult) (pediatric): Secondary | ICD-10-CM | POA: Diagnosis not present

## 2016-06-28 ENCOUNTER — Other Ambulatory Visit: Payer: Self-pay | Admitting: Cardiovascular Disease

## 2016-07-02 NOTE — Procedures (Signed)
   Patient Name: [name] [name], [name] [name] Date: 06/26/2016 Gender: Male D.O.B: [birth year] Age (years): 72 Referring Provider: Almyra Deforest PA Height (inches): 10 Interpreting Physician: Fransico Him MD, ABSM Weight (lbs): 169 RPSGT: Zadie Rhine BMI: 26 MRN: [id number] Neck Size: 16.00  CLINICAL INFORMATION The patient is referred for a BiPAP titration to treat sleep apnea. Date of NPSG, Split Night or HST:05/11/2016  SLEEP STUDY TECHNIQUE As per the AASM Manual for the Scoring of Sleep and Associated Events v2.3 (April 2016) with a hypopnea requiring 4% desaturations. The channels recorded and monitored were frontal, central and occipital EEG, electrooculogram (EOG), submentalis EMG (chin), nasal and oral airflow, thoracic and abdominal wall motion, anterior tibialis EMG, snore microphone, electrocardiogram, and pulse oximetry. Bilevel positive airway pressure (BPAP) was initiated at the beginning of the study and titrated to treat sleep-disordered breathing.  MEDICATIONS Medications self-administered by patient taken the night of the study : N/A  RESPIRATORY PARAMETERS Optimal IPAP Pressure (cm): 21  AHI at Optimal Pressure (/hr) 53.6 Optimal EPAP Pressure (cm):17   Overall Minimal O2 (%):90.00  Minimal O2 at Optimal Pressure (%): 93.0  SLEEP ARCHITECTURE Start Time:10:29:34 PM  Stop Time:4:38:01 AM  Total Time (min):368.5  Total Sleep Time (min):201.5 Sleep Latency (min):30.8  Sleep Efficiency (%):54.7 REM Latency (min):183.5  WASO (min):136.1 Stage N1 (%):15.88   Stage N2 (%):74.69  Stage N3 (%):0.00  Stage R (%):9.43 Supine (%):0.00   Arousal Index (/hr):47.0      CARDIAC DATA The 2 lead EKG demonstrated pacemaker generated. The mean heart rate was 63.20 beats per minute. Other EKG findings include: None.  LEG MOVEMENT DATA The total Periodic Limb Movements of Sleep (PLMS) were 0. The PLMS index was 0.00. A PLMS index of <15 is considered normal in adults.  IMPRESSIONS -  An optimal PAP pressure was selected for this patient ( 21/17 cm of water) - Moderate Central Sleep Apnea was noted during this titration (CAI = 27.4/h). - Mild oxygen desaturations were observed during this titration (min O2 = 90.00%). - No snoring was audible during this study. - No cardiac abnormalities were observed during this study. - Clinically significant periodic limb movements were not noted during this study. Arousals associated with PLMs were rare.  DIAGNOSIS - Obstructive Sleep Apnea (327.23 [G47.33 ICD-10])  RECOMMENDATIONS - Trial of BiPAP therapy on 21/17 cm H2O with a Small size Resmed Full Face Mask AirFit F20 mask and heated humidification. - Avoid alcohol, sedatives and other CNS depressants that may worsen sleep apnea and disrupt normal sleep architecture. - Sleep hygiene should be reviewed to assess factors that may improve sleep quality. - Weight management and regular exercise should be initiated or continued. - Return to Sleep Center for re-evaluation after 10 weeks of therapy  [place], [facility]  ELECTRONICALLY SIGNED ON:  07/02/2016, 8:53 PM [place] PH: [phone]   FX: [phone] [place]

## 2016-07-04 NOTE — Progress Notes (Signed)
07/04/16 Spoke with patient and advised him of titration results. I let him know that orders will be sent to [facility] and they will begin the process of getting him a machine to use in home. After we receive notification that he has the machine in his home and is using it, we will call to schedule a f/u appt with Dr.Traci Turner. He verbalized understanding of the process and thanked me for my call. Message has been sent to [facility].

## 2016-07-07 ENCOUNTER — Other Ambulatory Visit: Payer: Medicare Other

## 2016-07-07 ENCOUNTER — Ambulatory Visit: Payer: Medicare Other | Admitting: Cardiovascular Disease

## 2016-07-08 ENCOUNTER — Other Ambulatory Visit: Payer: Self-pay | Admitting: Internal Medicine

## 2016-07-08 DIAGNOSIS — Z95810 Presence of automatic (implantable) cardiac defibrillator: Secondary | ICD-10-CM

## 2016-07-08 DIAGNOSIS — I4589 Other specified conduction disorders: Secondary | ICD-10-CM

## 2016-07-08 DIAGNOSIS — I255 Ischemic cardiomyopathy: Secondary | ICD-10-CM

## 2016-07-08 DIAGNOSIS — I5022 Chronic systolic (congestive) heart failure: Secondary | ICD-10-CM

## 2016-07-10 DIAGNOSIS — G4733 Obstructive sleep apnea (adult) (pediatric): Secondary | ICD-10-CM | POA: Diagnosis not present

## 2016-07-12 DIAGNOSIS — G4733 Obstructive sleep apnea (adult) (pediatric): Secondary | ICD-10-CM | POA: Diagnosis not present

## 2016-08-01 ENCOUNTER — Ambulatory Visit (INDEPENDENT_AMBULATORY_CARE_PROVIDER_SITE_OTHER): Payer: Medicare Other | Admitting: Internal Medicine

## 2016-08-01 ENCOUNTER — Telehealth: Payer: Self-pay | Admitting: *Deleted

## 2016-08-01 ENCOUNTER — Encounter: Payer: Self-pay | Admitting: Internal Medicine

## 2016-08-01 VITALS — BP 116/60 | HR 82 | Ht 66.0 in | Wt 164.4 lb

## 2016-08-01 DIAGNOSIS — I5022 Chronic systolic (congestive) heart failure: Secondary | ICD-10-CM

## 2016-08-01 DIAGNOSIS — Z95810 Presence of automatic (implantable) cardiac defibrillator: Secondary | ICD-10-CM

## 2016-08-01 DIAGNOSIS — I255 Ischemic cardiomyopathy: Secondary | ICD-10-CM

## 2016-08-01 DIAGNOSIS — I4589 Other specified conduction disorders: Secondary | ICD-10-CM

## 2016-08-01 LAB — CUP PACEART INCLINIC DEVICE CHECK
Brady Statistic RA Percent Paced: 93 %
Brady Statistic RV Percent Paced: 4.9 %
Date Time Interrogation Session: 20171117131709
HighPow Impedance: 72 Ohm
Implantable Lead Implant Date: 20080711
Implantable Lead Implant Date: 20080711
Implantable Lead Location: 753859
Implantable Lead Location: 753860
Implantable Lead Model: 7122
Implantable Pulse Generator Implant Date: 20151007
Lead Channel Impedance Value: 350 Ohm
Lead Channel Impedance Value: 387.5 Ohm
Lead Channel Pacing Threshold Amplitude: 0.5 V
Lead Channel Pacing Threshold Amplitude: 0.5 V
Lead Channel Pacing Threshold Amplitude: 0.75 V
Lead Channel Pacing Threshold Amplitude: 0.75 V
Lead Channel Pacing Threshold Pulse Width: 0.5 ms
Lead Channel Pacing Threshold Pulse Width: 0.5 ms
Lead Channel Pacing Threshold Pulse Width: 0.5 ms
Lead Channel Pacing Threshold Pulse Width: 0.5 ms
Lead Channel Sensing Intrinsic Amplitude: 12 mV
Lead Channel Sensing Intrinsic Amplitude: 2.1 mV
Lead Channel Setting Pacing Amplitude: 1.5 V
Lead Channel Setting Pacing Amplitude: 2.5 V
Lead Channel Setting Pacing Pulse Width: 0.5 ms
Lead Channel Setting Sensing Sensitivity: 0.5 mV
Pulse Gen Serial Number: 7225183

## 2016-08-01 MED ORDER — LOSARTAN POTASSIUM 100 MG PO TABS: 50.0000 mg | ORAL_TABLET | Freq: Every day | ORAL | Status: DC

## 2016-08-01 MED ORDER — CLOPIDOGREL BISULFATE 75 MG PO TABS: 75.0000 mg | ORAL_TABLET | Freq: Every day | ORAL | 3 refills | Status: DC

## 2016-08-01 MED ORDER — CARVEDILOL 25 MG PO TABS: ORAL_TABLET | ORAL | Status: DC

## 2016-08-01 NOTE — Patient Instructions (Signed)
Medication Instructions: - Your physician has recommended you make the following change in your medication:  1) Decrease coreg (carvedilol) to 25 mg - take 1/2 tablet (12.5 mg) by mouth twice daily 2) Decrease cozaar (losartan) to 100 mg - take 1/2 tablet (50 mg) by mouth once daily  Labwork: - none ordered  Procedures/Testing: - none ordered  Follow-Up: - Remote monitoring is used to monitor your Pacemaker of ICD from home. This monitoring reduces the number of office visits required to check your device to one time per year. It allows [place] to keep an eye on the functioning of your device to ensure it is working properly. You are scheduled for a device check from home on 11/03/16. You may send your transmission at any time that day. If you have a wireless device, the transmission will be sent automatically. After your physician reviews your transmission, you will receive a postcard with your next transmission date.  - Your physician wants you to follow-up in: 1 year with Tommye Standard, PA for Dr. Caryl Comes. You will receive a reminder letter in the mail two months in advance. If you don't receive a letter, please call our office to schedule the follow-up appointment.  Any Additional Special Instructions Will Be Listed Below (If Applicable).     If you need a refill on your cardiac medications before your next appointment, please call your pharmacy.

## 2016-08-01 NOTE — Progress Notes (Signed)
Patient Care Team: Cassandria Anger, MD as PCP - General Carlena Bjornstad, MD (Cardiology) Deboraha Sprang, MD (Cardiology)   HPI  [name] [name] is a 82 y.o. male seen in followup for an ICD implanted for primary prevention in the setting of ischemic heart disease with prior bypass surgery. He underwent catheterization 7/17 for unstable angina and had DES 2 to his RI. EF 35-45%  According to him and his wife he has not quite recovered. He is having more problems with shortness of breath.  Hie underwent pacemaker generator replacement  10/15.  He has not had problems with chest pain.  He is having problems with lightheadedness. Blood pressures at home have been in the 95-110 range   Echo 9/14 EF 35-40%     Past Medical History:  Diagnosis Date  . AICD (automatic cardioverter/defibrillator) present   . Arrhythmia   . Arthritis    "hands, legs" (03/26/2016)  . 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 Chronic systolic CHF (congestive heart failure) ([place])    a. Echo 9/14: Mild LVH, mild focal basal septal hypertrophy, EF 35-40%, anteroseptal HK, normal diastolic function, mild MR, mild LAE, PASP 34 mmHg  . Chronotropic incompetence    treated w/pacemaker, ICD 7.2008. This was placed after CPX done post 2008 cath. CPX showed only chronotropic incompetence  . History of blood transfusion 1969   "after getting MSO4; it destroys my corpuscles" (03/26/2016)  . History of colon polyps   . Hyperlipidemia   . Hypertension   . Migraine    "just before my bypass" (03/26/2016)  . Mitral regurgitation    mild  . Myocardial infarction "several"  . Numbness and tingling of left arm and leg    improved  after tx w/prednisone  . Syncopal episodes    relative hypotension    Past Surgical History:  Procedure Laterality Date  . BACK SURGERY    . CARDIAC CATHETERIZATION  1980s-1990s X 4  . CARDIAC CATHETERIZATION N/A 03/26/2016   Procedure: Left Heart Cath and Cors/Grafts Angiography;  Surgeon: Burnell Blanks, MD;  Location: [facility];  Service: Cardiovascular;  Laterality: N/A;  . CARDIAC CATHETERIZATION N/A 03/26/2016   Procedure: Coronary Stent Intervention;  Surgeon: Burnell Blanks, MD;  Location: [place] [facility];  Service: Cardiovascular;  Laterality: N/A;  . CARDIAC DEFIBRILLATOR PLACEMENT  2008  . CATARACT EXTRACTION W/ INTRAOCULAR LENS  IMPLANT, BILATERAL Bilateral 08/2013 - 09/2013   right - left   . CORONARY ANGIOPLASTY WITH STENT PLACEMENT  ~ 1992; 03/26/2016   "1 + 2"  . CORONARY ARTERY BYPASS GRAFT  1992   CABG "X4"  . CYSTOSCOPY  1980s  . IMPLANTABLE CARDIOVERTER DEFIBRILLATOR (ICD) GENERATOR CHANGE N/A 06/21/2014   Procedure: ICD GENERATOR CHANGE;  Surgeon: Deboraha Sprang, MD;  Location: [facility];  Service: Cardiovascular;  Laterality: N/A;  . POSTERIOR LUMBAR FUSION  ~ 1989   "fused 2 discs"  . TIBIA FRACTURE SURGERY Left 1943   "put artificial bone in there"    Current Outpatient Prescriptions  Medication Sig Dispense Refill  . aspirin 81 MG tablet Take 1 tablet (81  mg total) by mouth daily. 30 tablet 11  . atorvastatin (LIPITOR) 40 MG tablet TAKE 1 TABLET DAILY 90 tablet 3  . carvedilol (COREG) 25 MG tablet Take 1 tablet (25 mg total) by mouth 2 (two) times daily. 180 tablet 3  . Cholecalciferol 1000 UNITS tablet Take 1,000 Units by mouth daily.      . digoxin (LANOXIN) 0.125 MG tablet TAKE ONE-HALF TABLET (0.0625 MG) DAILY 45 tablet 3  . famotidine (PEPCID) 40 MG tablet Take 1 tablet (40 mg total) by mouth daily. For wasp stings 5 tablet 0  . hydrochlorothiazide (MICROZIDE) 12.5 MG capsule Take 1 capsule (12.5 mg total) by mouth daily. 90  capsule 3  . isosorbide mononitrate (IMDUR) 30 MG 24 hr tablet Take 15 mg by mouth daily.    Marland Kitchen loratadine (CLARITIN) 10 MG tablet Take 1 tablet (10 mg total) by mouth daily. 10 tablet 0  . losartan (COZAAR) 100 MG tablet Take 1 tablet (100 mg total) by mouth daily. 90 tablet 3  . Multiple Vitamins-Minerals (PRESERVISION AREDS 2) CAPS Take 1 capsule by mouth 2 (two) times daily.    . nitroGLYCERIN (NITROLINGUAL) 0.4 MG/SPRAY spray Place 1 spray under the tongue every 5 (five) minutes x 3 doses as needed for chest pain.     . ticagrelor (BRILINTA) 90 MG TABS tablet Take 1 tablet (90 mg total) by mouth 2 (two) times daily. 60 tablet 12   No current facility-administered medications for this visit.     Allergies  Allergen Reactions  . Morphine     "increased white corpuscles"  . Ramipril Other (See Comments)    REACTION: cough if dose is over 2.5mg     Review of Systems negative except from HPI and PMH  Physical Exam BP 116/60   Pulse 82   Ht 5\' 6"  (1.676 m)   Wt 164 lb 6.4 oz (74.6 kg)   SpO2 96%   BMI 26.53 kg/m  Well developed and well nourished in no acute distress HENT normal E scleral and icterus clear Neck Supple JVP flat; carotids brisk and full Clear to ausculation Device pocket well healed; without hematoma or erythema.  There is no tethering   Regular rate and rhythm, no murmurs gallops or rub Soft with active bowel sounds No clubbing cyanosis  1- 2+Edema Alert and oriented, grossly normal motor and sensory function Skin Warm and Dry  ECG demonstrates atrial pacing at 70 Intervals 20/11/36 Axis left -42  Assessment and  Plan  Ischemic cardiomyopathy s/p CABG   Recent PCI  Sinus Node dysfunction  Dyspnea  Hypertension  Lightheadedness   HFrEF  Implantable Defibrillator  [employer] Jude The patient's device was interrogated.  The information was reviewed. No changes were made in the programming.        Blood pressure is well-controlled in fact may be too  low. We will decrease his carvedilol 25--12.5 and his Cozaar 100--50.  I'm concerned about his dyspnea as to whether this might be a manifestation of Brilinta toxicity. I will be in touch with Dr. Kendra Opitz to see if we should use alternative antiplatelet therapy.  Without symptoms of ischemia

## 2016-08-01 NOTE — Telephone Encounter (Signed)
Message  Received: Today  Message Contents  Burnell Blanks, MD  Deboraha Sprang, MD Cc: Thompson Grayer, RN        [name] [name], I will ask [name] to call him. We can stop Brilinta and start Plavix 75 mg daily. [name]   Previous Messages    ----- Message -----  From: Deboraha Sprang, MD  Sent: 08/01/2016 12:39 PM  To: Emily Filbert, RN, Thompson Grayer, RN, [name], [name] [name] is having dyspnea. He is on brilinta. Should We think about switching his antiplatelet therapy?      I spoke with pt and gave him instructions from Dr. Angelena Form. [facility]  He is aware to continue Brilinta until Plavix arrives.

## 2016-08-07 ENCOUNTER — Other Ambulatory Visit: Payer: Self-pay | Admitting: Cardiovascular Disease

## 2016-08-12 ENCOUNTER — Telehealth: Payer: Self-pay | Admitting: Cardiology

## 2016-08-12 DIAGNOSIS — G4733 Obstructive sleep apnea (adult) (pediatric): Secondary | ICD-10-CM | POA: Diagnosis not present

## 2016-08-12 NOTE — Telephone Encounter (Signed)
New Message  Pt call requesting to speak with RN about getting a f/u appt within 30 days of his last appt on 10/12. Please call back to discuss

## 2016-08-12 NOTE — Telephone Encounter (Signed)
**Note De-Identified Reed Eifert Obfuscation** The pt has been given an appt with Dr Radford Pax on 12/19 at 2:15. The pt is in agreement with date and time of app.   He states that he will call [facility] and let them know when his appt is. He states that he will ask [facility] to call us if they have any questions.

## 2016-08-28 ENCOUNTER — Encounter: Payer: Self-pay | Admitting: Cardiology

## 2016-08-29 ENCOUNTER — Encounter: Payer: Self-pay | Admitting: Cardiovascular Disease

## 2016-09-01 ENCOUNTER — Encounter: Payer: Self-pay | Admitting: Cardiology

## 2016-09-01 ENCOUNTER — Encounter (INDEPENDENT_AMBULATORY_CARE_PROVIDER_SITE_OTHER): Payer: Self-pay

## 2016-09-01 ENCOUNTER — Ambulatory Visit (INDEPENDENT_AMBULATORY_CARE_PROVIDER_SITE_OTHER): Payer: Medicare Other | Admitting: Cardiology

## 2016-09-01 VITALS — BP 116/72 | HR 68 | Resp 18 | Wt 165.0 lb

## 2016-09-01 DIAGNOSIS — G4733 Obstructive sleep apnea (adult) (pediatric): Secondary | ICD-10-CM | POA: Diagnosis not present

## 2016-09-01 DIAGNOSIS — I119 Hypertensive heart disease without heart failure: Secondary | ICD-10-CM

## 2016-09-01 HISTORY — DX: Obstructive sleep apnea (adult) (pediatric): G47.33

## 2016-09-01 NOTE — Patient Instructions (Signed)
Medication Instructions:  Your physician recommends that you continue on your current medications as directed. Please refer to the Current Medication list given to you today.   Labwork: None  Testing/Procedures: None  Follow-Up: Your physician wants you to follow-up in: 1 year with Dr. Radford Pax. You will receive a reminder letter in the mail two months in advance. If you don't receive a letter, please call our office to schedule the follow-up appointment.   Any Other Special Instructions Will Be Listed Below (If Applicable). Your BiPAP is being decreased to 20/16 cm H2O.     If you need a refill on your cardiac medications before your next appointment, please call your pharmacy.

## 2016-09-01 NOTE — Progress Notes (Signed)
Cardiology Office Note    Date:  09/01/2016   ID:  KAHLID [name], DOB 16-Aug-1929, MRN [id number]  PCP:  Walker Kehr, MD  Cardiologist:  Fransico Him, MD   Chief Complaint  Patient presents with  . Office Visit    10 week sleep f/u  . Sleep Apnea  . Hypertension    History of Present Illness:  [name] [name] is a 82 y.o. male with a history of CAD, CHF and HTN who was referred for sleep study due to excessive daytime sleepiness .  PSG showed severe OSA with an AHI of 38.5/hr and underwent BiPAP titration to 18cm H2O.  He is now here for followup. He is doing well with his PAP device.  He tolerates the full face mask and feels the pressure is adequate.  Since going on PAP he feels more rested and has less daytime sleepiness and occasionally still naps.  He denies any significant mouth dryness or nasal congestion.  He does not think that she snores.  He does not sleep supine.   Past Medical History:  Diagnosis Date  . AICD (automatic cardioverter/defibrillator) present   . Arrhythmia   . Arthritis    "hands, legs" (03/26/2016)  . 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 Chronic systolic CHF (congestive heart failure) ([place])    a. Echo 9/14: Mild LVH, mild focal basal septal hypertrophy, EF 35-40%, anteroseptal HK, normal diastolic function, mild MR, mild LAE, PASP 34 mmHg  . Chronotropic incompetence    treated w/pacemaker, ICD 7.2008. This was placed after CPX done post 2008 cath. CPX showed only chronotropic incompetence  . History of blood transfusion 1969   "after getting MSO4; it destroys my corpuscles" (03/26/2016)  . History of colon polyps   . Hyperlipidemia   . Hypertension   . Migraine    "just before my bypass" (03/26/2016)  . Mitral regurgitation    mild  . Myocardial infarction "several"  . Numbness and tingling of left arm and leg    improved after tx w/prednisone  . OSA (obstructive sleep apnea) 09/01/2016   Severe OSA with AHI 38/hr now on BiPAP at 21/17cm H2O  . Syncopal episodes    relative hypotension    Past Surgical History:  Procedure Laterality Date  . BACK SURGERY    . CARDIAC CATHETERIZATION  1980s-1990s X 4  . CARDIAC CATHETERIZATION N/A 03/26/2016   Procedure: Left Heart Cath and Cors/Grafts Angiography;  Surgeon: Burnell Blanks, MD;  Location: [facility];  Service: Cardiovascular;  Laterality: N/A;  . CARDIAC CATHETERIZATION N/A 03/26/2016   Procedure: Coronary Stent Intervention;  Surgeon: Burnell Blanks, MD;  Location: [place] [facility];  Service: Cardiovascular;  Laterality: N/A;  . CARDIAC DEFIBRILLATOR PLACEMENT  2008  . CATARACT EXTRACTION W/ INTRAOCULAR LENS  IMPLANT, BILATERAL Bilateral 08/2013 - 09/2013   right - left   . CORONARY ANGIOPLASTY WITH STENT PLACEMENT  ~ 1992; 03/26/2016   "1 + 2"  . CORONARY ARTERY BYPASS GRAFT  1992   CABG "X4"  . CYSTOSCOPY  1980s  . IMPLANTABLE CARDIOVERTER DEFIBRILLATOR (ICD) GENERATOR CHANGE N/A 06/21/2014   Procedure: ICD GENERATOR CHANGE;  Surgeon: Deboraha Sprang, MD;  Location: [facility] CATH LAB;  Service: Cardiovascular;  Laterality: N/A;  . POSTERIOR LUMBAR FUSION  ~ 1989   "fused 2 discs"  . TIBIA FRACTURE SURGERY Left 1943   "put artificial bone in there"    Current Medications: Outpatient Medications Prior to Visit  Medication Sig Dispense Refill  . aspirin 81 MG tablet Take 1 tablet (81 mg total) by mouth daily. 30 tablet 11  . atorvastatin (LIPITOR) 40 MG tablet TAKE 1 TABLET DAILY 90 tablet 3  . carvedilol (COREG) 25 MG tablet Take 1/2 tablet (12.5 mg) by mouth twice daily    . carvedilol (COREG) 25 MG tablet Take 0.5 tablets (12.5 mg total) by mouth 2 (two) times daily. 90  tablet 3  . Cholecalciferol 1000 UNITS tablet Take 1,000 Units by mouth daily.      . clopidogrel (PLAVIX) 75 MG tablet Take 1 tablet (75 mg total) by mouth daily. 90 tablet 3  . digoxin (LANOXIN) 0.125 MG tablet TAKE ONE-HALF TABLET (0.0625 MG) DAILY 45 tablet 3  . famotidine (PEPCID) 40 MG tablet Take 1 tablet (40 mg total) by mouth daily. For wasp stings 5 tablet 0  . hydrochlorothiazide (MICROZIDE) 12.5 MG capsule Take 1 capsule (12.5 mg total) by mouth daily. 90 capsule 3  . isosorbide mononitrate (IMDUR) 30 MG 24 hr tablet Take 15 mg by mouth daily.    Marland Kitchen loratadine (CLARITIN) 10 MG tablet Take 1 tablet (10 mg total) by mouth daily. 10 tablet 0  . losartan (COZAAR) 100 MG tablet Take 0.5 tablets (50 mg total) by mouth daily.    . Multiple Vitamins-Minerals (PRESERVISION AREDS 2) CAPS Take 1 capsule by mouth 2 (two) times daily.    . nitroGLYCERIN (NITROLINGUAL) 0.4 MG/SPRAY spray Place 1 spray under the tongue every 5 (five) minutes x 3 doses as needed for chest pain.      No facility-administered medications prior to visit.      Allergies:   Morphine and Ramipril   Social History   Social History  . Marital status: Married    Spouse name: N/A  . Number of children: N/A  . Years of education: N/A   Occupational History  . Retired    Social History Main Topics  . Smoking status: Former Smoker    Years: 29.00    Types: Cigars  . Smokeless tobacco: Never Used     Comment: quit smoking cigars in 1973  . Alcohol use Yes     Comment: "quit drinking in 1973"  . Drug use: No  . Sexual activity: Yes   Other Topics Concern  . None   Social History Narrative   Widowed - 06/2010     Family History:  The patient's family history includes Colon cancer in his other; Hypertension in his father, mother, and other.   ROS:   Please see the history of present illness.    ROS All other systems reviewed and are negative.  No flowsheet data found.     PHYSICAL EXAM:   VS:  BP  116/72   Pulse 68   Resp 18   Wt 165 lb (74.8 kg)   SpO2 98%   BMI 26.63 kg/m    GEN: Well nourished, well developed, in no acute distress  HEENT: normal  Neck: no JVD, carotid bruits, or masses Cardiac: RRR; no murmurs, rubs, or gallops,no edema.  Intact distal pulses bilaterally.  Respiratory:  clear to auscultation bilaterally, normal work of breathing GI:  soft, nontender, nondistended, + BS MS: no deformity or atrophy  Skin: warm and dry, no rash Neuro:  Alert and Oriented x 3, Strength and sensation are intact Psych: euthymic mood, full affect  Wt Readings from Last 3 Encounters:  09/01/16 165 lb (74.8 kg)  08/01/16 164 lb 6.4 oz (74.6 kg)  06/26/16 169 lb (76.7 kg)      Studies/Labs Reviewed:   EKG:  EKG is not ordered today.    Recent Labs: 04/04/2016: BUN 19; Creat 1.22; Hemoglobin 11.9; Platelets 227; Potassium 4.6; Sodium 136   Lipid Panel    Component Value Date/Time   CHOL 86 (L) 07/17/2015 1023   TRIG 145 07/17/2015 1023   HDL 25 (L) 07/17/2015 1023   CHOLHDL 3.4 07/17/2015 1023   VLDL 29 07/17/2015 1023   LDLCALC 32 07/17/2015 1023   LDLDIRECT 50.1 04/28/2011 0827    Additional studies/ records that were reviewed today include:  CPAP download    ASSESSMENT:    1. Hypertensive heart disease without heart failure   2. OSA (obstructive sleep apnea)      PLAN:  In order of problems listed above:  OSA - the patient is tolerating PAP therapy well without any problems. The PAP download was reviewed today and showed an AHI of 2.3/hr on 21/17 cm H2O with 100% compliance in using more than 4 hours nightly.  The patient has been using and benefiting from CPAP use and will continue to benefit from therapy. He wouldl ike to try to lower the pressure some because sometimes it wakes him up at night.  I will decrease it to 20/16cm H2O and get a download in 2 weeks 2.   HTN - BP controlled on current meds.    Medication Adjustments/Labs and Tests  Ordered: Current medicines are reviewed at length with the patient today.  Concerns regarding medicines are outlined above.  Medication changes, Labs and Tests ordered today are listed in the Patient Instructions below.  There are no Patient Instructions on file for this visit.   Signed, Fransico Him, MD  09/01/2016 9:59 AM    [facility] [place], [place],   [address] Phone: [phone]; Fax: [phone]

## 2016-09-02 ENCOUNTER — Ambulatory Visit: Payer: Medicare Other | Admitting: Cardiology

## 2016-09-05 ENCOUNTER — Telehealth: Payer: Self-pay | Admitting: Cardiovascular Disease

## 2016-09-05 NOTE — Telephone Encounter (Signed)
New Message:    Pt has a cold,he wants to know what can he take? He is concerned,because he is on Plavix.

## 2016-09-05 NOTE — Telephone Encounter (Signed)
Spoke with patient who states he is getting a cold and would like to know what he can take since he cannot take additional aspirin.  I advised him to take Tylenol for fever and/or body aches and to take Mucinex for cough and congestion. I advised him not to take any agents with decongestant.  He verbalized understanding and agreement and thanked me for the call.

## 2016-09-11 ENCOUNTER — Other Ambulatory Visit: Payer: Self-pay

## 2016-09-11 DIAGNOSIS — G4733 Obstructive sleep apnea (adult) (pediatric): Secondary | ICD-10-CM | POA: Diagnosis not present

## 2016-09-18 ENCOUNTER — Encounter: Payer: Self-pay | Admitting: Cardiovascular Disease

## 2016-09-18 ENCOUNTER — Other Ambulatory Visit: Payer: Medicare Other | Admitting: *Deleted

## 2016-09-18 ENCOUNTER — Ambulatory Visit (INDEPENDENT_AMBULATORY_CARE_PROVIDER_SITE_OTHER): Payer: Medicare Other | Admitting: Cardiovascular Disease

## 2016-09-18 VITALS — BP 110/66 | HR 70 | Ht 66.0 in | Wt 166.8 lb

## 2016-09-18 DIAGNOSIS — I5022 Chronic systolic (congestive) heart failure: Secondary | ICD-10-CM | POA: Diagnosis not present

## 2016-09-18 DIAGNOSIS — I255 Ischemic cardiomyopathy: Secondary | ICD-10-CM

## 2016-09-18 DIAGNOSIS — I2510 Atherosclerotic heart disease of native coronary artery without angina pectoris: Secondary | ICD-10-CM

## 2016-09-18 DIAGNOSIS — I110 Hypertensive heart disease with heart failure: Secondary | ICD-10-CM

## 2016-09-18 NOTE — Patient Instructions (Signed)

## 2016-09-18 NOTE — Progress Notes (Signed)
Cardiology Office Note   Date:  09/18/2016   ID:  [name] [name], DOB 1929/06/19, MRN [id number]  PCP:  Walker Kehr, MD  Cardiologist:  Mertie Moores, MD   Problem list 1. Coronary artery disease-status post coronary artery bypass grafting 2. Hyperlipidemia 3. Chronic systolic congestive heart failure - EF 35-40% 4. ICD -  5.  [facility] - Fransico Him, MD   Chief Complaint  Patient presents with  . Coronary Artery Disease      History of Present Illness: [name] [name] is a 83 y.o. male who presents today to follow-up coronary disease and ischemic cardiomyopathy and a history of hypertension. He did have an episode of syncope in the past that was related to hypotension. More recently his blood pressure has been elevated. He was hesitant to have his ramapril dose increased because he associated this with his syncopal episode in the past. Losartan was added to his medications. He is getting excellent control and I have left him on both. He feels good. There is no chest pain. He is fully active. He has an ICD in place that is followed carefully by electrophysiology.  Retired [employer] ( [place]   Nov. 1, 2016:  Doing well.  No CP or dyspnea. 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   January 08, 2016:  See for follow up for his CAD / CABG, chronic systolic CHF , HTN Doing well.    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   Stays active  Does not ride anymore.  Has some fatigue , no CP   Jan. 4, 2018:  Still taking care of his horses.   Feeds and hay twice  No CP , no dyspnea.     Past Medical History:  Diagnosis Date  . AICD (automatic cardioverter/defibrillator) present   . Arrhythmia   . Arthritis    "hands, legs" (03/26/2016)  . 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 Chronic systolic CHF (congestive heart failure) ([place])    a. Echo 9/14: Mild LVH, mild focal basal septal hypertrophy, EF 35-40%, anteroseptal HK, normal diastolic function, mild MR, mild LAE, PASP 34 mmHg  . Chronotropic incompetence    treated w/pacemaker, ICD 7.2008. This was placed after CPX done post 2008 cath. CPX showed only chronotropic incompetence  . History of blood transfusion 1969   "after getting MSO4; it destroys my corpuscles" (03/26/2016)  . History of colon polyps   . Hyperlipidemia   . Hypertension   . Migraine    "just before my bypass" (03/26/2016)  . Mitral regurgitation    mild  . Myocardial infarction "several"  . Numbness and tingling of left arm and leg    improved after tx w/prednisone  . OSA (obstructive sleep apnea) 09/01/2016   Severe OSA with AHI 38/hr now on BiPAP at 21/17cm H2O  . Syncopal episodes    relative hypotension    Past Surgical History:  Procedure Laterality Date  . BACK SURGERY    . CARDIAC CATHETERIZATION  1980s-1990s X 4  . CARDIAC CATHETERIZATION N/A 03/26/2016   Procedure: Left Heart Cath and Cors/Grafts  Angiography;  Surgeon: Burnell Blanks, MD;  Location: [facility];  Service: Cardiovascular;  Laterality: N/A;  . CARDIAC CATHETERIZATION N/A 03/26/2016   Procedure: Coronary Stent Intervention;  Surgeon: Burnell Blanks, MD;  Location: [facility];  Service: Cardiovascular;  Laterality: N/A;  . CARDIAC DEFIBRILLATOR PLACEMENT  2008  . CATARACT EXTRACTION W/ INTRAOCULAR LENS  IMPLANT, BILATERAL Bilateral 08/2013 - 09/2013   right - left   . CORONARY ANGIOPLASTY WITH STENT PLACEMENT  ~ 1992; 03/26/2016   "1 + 2"  . CORONARY ARTERY BYPASS GRAFT  1992   CABG "X4"  . CYSTOSCOPY  1980s  . IMPLANTABLE CARDIOVERTER DEFIBRILLATOR (ICD) GENERATOR CHANGE N/A 06/21/2014   Procedure: ICD GENERATOR CHANGE;   Surgeon: Deboraha Sprang, MD;  [facility];  Service: Cardiovascular;  Laterality: N/A;  . POSTERIOR LUMBAR FUSION  ~ 1989   "fused 2 discs"  . TIBIA FRACTURE SURGERY Left 1943   "put artificial bone in there"    Patient Active Problem List   Diagnosis Date Noted  . Chronic systolic CHF (congestive heart failure) ([place]) 11/18/2013    Priority: High  . CAD (coronary artery disease)     Priority: High  . OSA (obstructive sleep apnea) 09/01/2016  . Status post coronary artery stent placement   . Hypertensive heart disease with heart failure ([place])   . Unstable angina ([place])   . Thumb pain 05/04/2015  . Ejection fraction   . Well adult exam 07/13/2012  . Chronic renal insufficiency, stage III (moderate) 07/21/2011  . Ischemic cardiomyopathy   . Hx of CABG   . Chronotropic incompetence   . Syncopal episodes   . Mitral regurgitation   . Numbness and tingling of left arm and leg   . PARESTHESIA 10/22/2009  . TOBACCO USE, QUIT 10/22/2009  . SEBACEOUS CYST 08/27/2009  . Anemia, chronic disease 02/18/2008  . Dyslipidemia 05/03/2007  . Hypertensive heart disease 05/03/2007  . COLONIC POLYPS, HX OF 05/03/2007  . Automatic implantable cardioverter-defibrillator in situ 03/16/2007      Current Outpatient Prescriptions  Medication Sig Dispense Refill  . aspirin 81 MG tablet Take 1 tablet (81 mg total) by mouth daily. 30 tablet 11  . atorvastatin (LIPITOR) 40 MG tablet TAKE 1 TABLET DAILY 90 tablet 3  . carvedilol (COREG) 25 MG tablet Take 0.5 tablets (12.5 mg total) by mouth 2 (two) times daily. 90 tablet 3  . Cholecalciferol 1000 UNITS tablet Take 1,000 Units by mouth daily.      . clopidogrel (PLAVIX) 75 MG tablet Take 1 tablet (75 mg total) by mouth daily. 90 tablet 3  . digoxin (LANOXIN) 0.125 MG tablet TAKE ONE-HALF TABLET (0.0625 MG) DAILY 45 tablet 3  . famotidine (PEPCID) 40 MG tablet Take 1 tablet (40 mg total) by mouth daily. For wasp stings 5 tablet 0  .  hydrochlorothiazide (MICROZIDE) 12.5 MG capsule Take 1 capsule (12.5 mg total) by mouth daily. 90 capsule 3  . isosorbide mononitrate (IMDUR) 30 MG 24 hr tablet Take 15 mg by mouth daily.    Marland Kitchen loratadine (CLARITIN) 10 MG tablet Take 1 tablet (10 mg total) by mouth daily. 10 tablet 0  . losartan (COZAAR) 100 MG tablet Take 0.5 tablets (50 mg total) by mouth daily.    . Multiple Vitamins-Minerals (PRESERVISION AREDS 2) CAPS Take 1 capsule by mouth 2 (two) times daily.    . nitroGLYCERIN (NITROLINGUAL) 0.4 MG/SPRAY spray Place 1 spray under the tongue every 5 (five) minutes  x 3 doses as needed for chest pain.      No current facility-administered medications for this visit.     Allergies:   Morphine and Ramipril    Social History:  The patient  reports that he has quit smoking. His smoking use included Cigars. He quit after 29.00 years of use. He has never used smokeless tobacco. He reports that he drinks alcohol. He reports that he does not use drugs.   Family History:  The patient's family history includes Colon cancer in his other; Hypertension in his father, mother, and other.    ROS:  Please see the history of present illness.     Patient denies fever, chills, headache, sweats, rash, change in vision, change in hearing, chest pain, cough, nausea or vomiting, urinary symptoms. All other systems are reviewed and are negative.   PHYSICAL EXAM: VS:  BP 110/66 (BP Location: Right Arm, Patient Position: Sitting, Cuff Size: Normal)   Pulse 70   Ht 5\' 6"  (1.676 m)   Wt 166 lb 12.8 oz (75.7 kg)   SpO2 93%   BMI 26.92 kg/m  , Patient is stable. He looks good. He is oriented to person time and place. Affect is normal. Head is atraumatic. Sclera and conjunctiva are normal. There is no jugular venous distention.  Lungs are clear. Respiratory effort is nonlabored.  Cardiac exam reveals S1 and S2.  Abdomen is soft and there is no peripheral edema. There are no musculoskeletal deformities. There  are no skin rashes.     Neurologic is grossly intact.  EKG:   EKG is not done today.   Recent Labs: 04/04/2016: BUN 19; Creat 1.22; Hemoglobin 11.9; Platelets 227; Potassium 4.6; Sodium 136    Lipid Panel    Component Value Date/Time   CHOL 86 (L) 07/17/2015 1023   TRIG 145 07/17/2015 1023   HDL 25 (L) 07/17/2015 1023   CHOLHDL 3.4 07/17/2015 1023   VLDL 29 07/17/2015 1023   LDLCALC 32 07/17/2015 1023   LDLDIRECT 50.1 04/28/2011 0827      Wt Readings from Last 3 Encounters:  09/18/16 166 lb 12.8 oz (75.7 kg)  09/01/16 165 lb (74.8 kg)  08/01/16 164 lb 6.4 oz (74.6 kg)      Current medicines are reviewed  The patient understands his medications.   ASSESSMENT AND PLAN:  1. Coronary artery disease-status post coronary artery bypass grafting - no angina .  Continue current meds  2. Hyperlipidemia - labs checked today  will check labs in 6 months , continue atorvastatin   3. Chronic systolic congestive heart failure - EF 35-40% - continue meds. On coreg and Losartan    4. ICD -  Followed by Dr. Caryl Comes   5. Obstructive sleep apnea.   Mertie Moores, MD  09/18/2016 9:58 AM    [place] [place],  [place], [place]  [address] Pager [phone] Phone: [phone]; Fax: [phone]

## 2016-09-19 ENCOUNTER — Telehealth: Payer: Self-pay | Admitting: Cardiovascular Disease

## 2016-09-19 LAB — HEPATIC FUNCTION PANEL
ALBUMIN: 4.1 g/dL (ref 3.5–4.7)
ALT: 17 IU/L (ref 0–44)
AST: 17 IU/L (ref 0–40)
Alkaline Phosphatase: 119 IU/L — ABNORMAL HIGH (ref 39–117)
BILIRUBIN TOTAL: 0.5 mg/dL (ref 0.0–1.2)
BILIRUBIN, DIRECT: 0.21 mg/dL (ref 0.00–0.40)
Total Protein: 6.6 g/dL (ref 6.0–8.5)

## 2016-09-19 LAB — BASIC METABOLIC PANEL WITH GFR
BUN/Creatinine Ratio: 18 (ref 10–24)
BUN: 23 mg/dL (ref 8–27)
CO2: 21 mmol/L (ref 18–29)
Calcium: 8.9 mg/dL (ref 8.6–10.2)
Chloride: 101 mmol/L (ref 96–106)
Creatinine, Ser: 1.27 mg/dL (ref 0.76–1.27)
GFR calc Af Amer: 59 mL/min/{1.73_m2} — ABNORMAL LOW
GFR calc non Af Amer: 51 mL/min/{1.73_m2} — ABNORMAL LOW
Glucose: 118 mg/dL — ABNORMAL HIGH (ref 65–99)
Potassium: 4.5 mmol/L (ref 3.5–5.2)
Sodium: 140 mmol/L (ref 134–144)

## 2016-09-19 LAB — LIPID PANEL
CHOLESTEROL TOTAL: 92 mg/dL — AB (ref 100–199)
Chol/HDL Ratio: 3.4 ratio units (ref 0.0–5.0)
HDL: 27 mg/dL — ABNORMAL LOW (ref >39)
LDL CALC: 32 mg/dL (ref 0–99)
Triglycerides: 163 mg/dL — ABNORMAL HIGH (ref 0–149)
VLDL Cholesterol Cal: 33 mg/dL (ref 5–40)

## 2016-09-19 NOTE — Telephone Encounter (Signed)
Reviewed lab results and plan to continue current medications with patient who verbalized understanding and thanked me for the call.

## 2016-09-19 NOTE — Telephone Encounter (Signed)
[place]   [facility] re lab results   Cell phone [phone] if no answer after 4pm

## 2016-09-22 ENCOUNTER — Encounter: Payer: Self-pay | Admitting: Cardiology

## 2016-10-12 DIAGNOSIS — G4733 Obstructive sleep apnea (adult) (pediatric): Secondary | ICD-10-CM | POA: Diagnosis not present

## 2016-10-14 ENCOUNTER — Other Ambulatory Visit: Payer: Self-pay | Admitting: Cardiovascular Disease

## 2016-10-15 NOTE — Telephone Encounter (Signed)
AV[facility] Reports   Date/Time Report Action User  08/01/2016 12:42 PM After Visit Summary Printed Emily Filbert, RN  Patient Instructions   Medication Instructions: - Your physician has recommended you make the following change in your medication:  1) Decrease coreg (carvedilol) to 25 mg - take 1/2 tablet (12.5 mg) by mouth twice daily 2) Decrease cozaar (losartan) to 100 mg - take 1/2 tablet (50 mg) by mouth once daily

## 2016-10-16 DIAGNOSIS — G4733 Obstructive sleep apnea (adult) (pediatric): Secondary | ICD-10-CM | POA: Diagnosis not present

## 2016-11-03 ENCOUNTER — Ambulatory Visit (INDEPENDENT_AMBULATORY_CARE_PROVIDER_SITE_OTHER): Payer: Medicare Other | Admitting: *Deleted

## 2016-11-03 DIAGNOSIS — I255 Ischemic cardiomyopathy: Secondary | ICD-10-CM | POA: Diagnosis not present

## 2016-11-04 ENCOUNTER — Encounter: Payer: Self-pay | Admitting: Cardiology

## 2016-11-04 LAB — CUP PACEART REMOTE DEVICE CHECK
Battery Remaining Longevity: 63 mo
Battery Remaining Percentage: 74 %
Battery Voltage: 2.96 V
Brady Statistic AP VS Percent: 90 %
Brady Statistic AS VP Percent: 1 %
Brady Statistic AS VS Percent: 5.1 %
Brady Statistic RA Percent Paced: 93 %
HIGH POWER IMPEDANCE MEASURED VALUE: 78 Ohm
HighPow Impedance: 78 Ohm
Implantable Lead Implant Date: 20080711
Implantable Lead Location: 753860
Implantable Lead Model: 7122
Lead Channel Impedance Value: 340 Ohm
Lead Channel Impedance Value: 390 Ohm
Lead Channel Pacing Threshold Amplitude: 0.5 V
Lead Channel Pacing Threshold Amplitude: 0.75 V
Lead Channel Pacing Threshold Pulse Width: 0.5 ms
Lead Channel Pacing Threshold Pulse Width: 0.5 ms
Lead Channel Sensing Intrinsic Amplitude: 12 mV
Lead Channel Sensing Intrinsic Amplitude: 2.4 mV
Lead Channel Setting Pacing Amplitude: 1.5 V
Lead Channel Setting Pacing Pulse Width: 0.5 ms
Lead Channel Setting Sensing Sensitivity: 0.5 mV
MDC IDC LEAD IMPLANT DT: 20080711
MDC IDC LEAD LOCATION: 753859
MDC IDC PG IMPLANT DT: 20151007
MDC IDC PG SERIAL: 7225183
MDC IDC SESS DTM: 20180219070017
MDC IDC SET LEADCHNL RV PACING AMPLITUDE: 2.5 V
MDC IDC STAT BRADY AP VP PERCENT: 4.9 %
MDC IDC STAT BRADY RV PERCENT PACED: 4.9 %

## 2016-11-04 NOTE — Progress Notes (Signed)
Remote ICD transmission.   

## 2016-11-12 DIAGNOSIS — G4733 Obstructive sleep apnea (adult) (pediatric): Secondary | ICD-10-CM | POA: Diagnosis not present

## 2016-12-10 DIAGNOSIS — G4733 Obstructive sleep apnea (adult) (pediatric): Secondary | ICD-10-CM | POA: Diagnosis not present

## 2016-12-24 ENCOUNTER — Ambulatory Visit (INDEPENDENT_AMBULATORY_CARE_PROVIDER_SITE_OTHER): Payer: Medicare Other | Admitting: Internal Medicine

## 2016-12-24 ENCOUNTER — Other Ambulatory Visit (INDEPENDENT_AMBULATORY_CARE_PROVIDER_SITE_OTHER): Payer: Medicare Other

## 2016-12-24 ENCOUNTER — Encounter: Payer: Self-pay | Admitting: Internal Medicine

## 2016-12-24 VITALS — BP 132/66 | HR 70 | Temp 97.4°F | Ht 66.0 in | Wt 163.0 lb

## 2016-12-24 DIAGNOSIS — Z0001 Encounter for general adult medical examination with abnormal findings: Secondary | ICD-10-CM

## 2016-12-24 DIAGNOSIS — Z0000 Encounter for general adult medical examination without abnormal findings: Secondary | ICD-10-CM

## 2016-12-24 DIAGNOSIS — E785 Hyperlipidemia, unspecified: Secondary | ICD-10-CM

## 2016-12-24 DIAGNOSIS — G8929 Other chronic pain: Secondary | ICD-10-CM

## 2016-12-24 DIAGNOSIS — R7989 Other specified abnormal findings of blood chemistry: Secondary | ICD-10-CM | POA: Diagnosis not present

## 2016-12-24 DIAGNOSIS — N320 Bladder-neck obstruction: Secondary | ICD-10-CM

## 2016-12-24 DIAGNOSIS — R7309 Other abnormal glucose: Secondary | ICD-10-CM

## 2016-12-24 DIAGNOSIS — M25561 Pain in right knee: Secondary | ICD-10-CM | POA: Diagnosis not present

## 2016-12-24 LAB — HEMOGLOBIN A1C: HEMOGLOBIN A1C: 6.4 % (ref 4.6–6.5)

## 2016-12-24 LAB — BASIC METABOLIC PANEL
BUN: 18 mg/dL (ref 6–23)
CHLORIDE: 101 meq/L (ref 96–112)
CO2: 30 mEq/L (ref 19–32)
CREATININE: 1.11 mg/dL (ref 0.40–1.50)
Calcium: 9.5 mg/dL (ref 8.4–10.5)
GFR: 66.87 mL/min (ref >60.00)
Glucose, Bld: 108 mg/dL — ABNORMAL HIGH (ref 70–99)
POTASSIUM: 4.4 meq/L (ref 3.5–5.1)
SODIUM: 139 meq/L (ref 135–145)

## 2016-12-24 LAB — CBC WITH DIFFERENTIAL/PLATELET
BASOS ABS: 0.1 10*3/uL (ref 0.0–0.1)
Basophils Relative: 0.9 % (ref 0.0–3.0)
EOS ABS: 0.2 10*3/uL (ref 0.0–0.7)
Eosinophils Relative: 2.9 % (ref 0.0–5.0)
HCT: 38.1 % — ABNORMAL LOW (ref 39.0–52.0)
Hemoglobin: 12.8 g/dL — ABNORMAL LOW (ref 13.0–17.0)
Lymphocytes Relative: 15.3 % (ref 12.0–46.0)
Lymphs Abs: 1.3 10*3/uL (ref 0.7–4.0)
MCHC: 33.5 g/dL (ref 30.0–36.0)
MCV: 84.2 fl (ref 78.0–100.0)
MONO ABS: 0.6 10*3/uL (ref 0.1–1.0)
Monocytes Relative: 7.5 % (ref 3.0–12.0)
NEUTROS ABS: 6 10*3/uL (ref 1.4–7.7)
NEUTROS PCT: 73.4 % (ref 43.0–77.0)
PLATELETS: 224 10*3/uL (ref 150.0–400.0)
RBC: 4.53 Mil/uL (ref 4.22–5.81)
RDW: 15.4 % (ref 11.5–15.5)
WBC: 8.2 10*3/uL (ref 4.0–10.5)

## 2016-12-24 LAB — LIPID PANEL
CHOL/HDL RATIO: 4
CHOLESTEROL: 101 mg/dL (ref 0–200)
HDL: 27.4 mg/dL — ABNORMAL LOW (ref >39.00)
NonHDL: 73.47
Triglycerides: 209 mg/dL — ABNORMAL HIGH (ref 0.0–149.0)
VLDL: 41.8 mg/dL — AB (ref 0.0–40.0)

## 2016-12-24 LAB — HEPATIC FUNCTION PANEL
ALK PHOS: 105 U/L (ref 39–117)
ALT: 14 U/L (ref 0–53)
AST: 15 U/L (ref 0–37)
Albumin: 4.3 g/dL (ref 3.5–5.2)
Bilirubin, Direct: 0.2 mg/dL (ref 0.0–0.3)
Total Bilirubin: 0.9 mg/dL (ref 0.2–1.2)
Total Protein: 6.9 g/dL (ref 6.0–8.3)

## 2016-12-24 LAB — URINALYSIS
Bilirubin Urine: NEGATIVE
HGB URINE DIPSTICK: NEGATIVE
Ketones, ur: NEGATIVE
Leukocytes, UA: NEGATIVE
Nitrite: NEGATIVE
Specific Gravity, Urine: <=1.005 — AB (ref 1.000–1.030)
TOTAL PROTEIN, URINE-UPE24: NEGATIVE
URINE GLUCOSE: NEGATIVE
Urobilinogen, UA: 0.2 (ref 0.0–1.0)
pH: 6.5 (ref 5.0–8.0)

## 2016-12-24 LAB — LDL CHOLESTEROL, DIRECT: LDL DIRECT: 35 mg/dL

## 2016-12-24 LAB — PSA: PSA: 3.11 ng/mL (ref 0.10–4.00)

## 2016-12-24 LAB — TSH: TSH: 3.71 u[IU]/mL (ref 0.35–4.50)

## 2016-12-24 MED ORDER — METHYLPREDNISOLONE ACETATE 40 MG/ML IJ SUSP
40.0000 mg | Freq: Once | INTRAMUSCULAR | Status: AC
  Administered 2016-12-24: 40 mg via INTRA_ARTICULAR

## 2016-12-24 MED ORDER — METHYLPREDNISOLONE ACETATE 40 MG/ML IJ SUSP: 40.0000 mg | Freq: Once | INTRAMUSCULAR | Status: DC

## 2016-12-24 NOTE — Assessment & Plan Note (Signed)

## 2016-12-24 NOTE — Progress Notes (Signed)
Subjective:  Patient ID: [name] [name], male    DOB: 17-Mar-1929  Age: 83 y.o. MRN: [id number]  CC: No chief complaint on file.   HPI [name] [name] presents for a well exam C/o R knee pain x weeks  Outpatient Medications Prior to Visit  Medication Sig Dispense Refill  . aspirin 81 MG tablet Take 1 tablet (81 mg total) by mouth daily. 30 tablet 11  . atorvastatin (LIPITOR) 40 MG tablet TAKE 1 TABLET DAILY 90 tablet 3  . carvedilol (COREG) 25 MG tablet Take 0.5 tablets (12.5 mg total) by mouth 2 (two) times daily. 90 tablet 3  . Cholecalciferol 1000 UNITS tablet Take 1,000 Units by mouth daily.      . clopidogrel (PLAVIX) 75 MG tablet Take 1 tablet (75 mg total) by mouth daily. 90 tablet 3  . digoxin (LANOXIN) 0.125 MG tablet TAKE ONE-HALF TABLET (0.0625 MG) DAILY 45 tablet 3  . famotidine (PEPCID) 40 MG tablet Take 1 tablet (40 mg total) by mouth daily. For wasp stings 5 tablet 0  . hydrochlorothiazide (MICROZIDE) 12.5 MG capsule Take 1 capsule (12.5 mg total) by mouth daily. 90 capsule 3  . isosorbide mononitrate (IMDUR) 30 MG 24 hr tablet Take 15 mg by mouth daily.    Marland Kitchen loratadine (CLARITIN) 10 MG tablet Take 1 tablet (10 mg total) by mouth daily. 10 tablet 0  . losartan (COZAAR) 100 MG tablet Take 0.5 tablets (50 mg total) by mouth daily.    Marland Kitchen losartan (COZAAR) 100 MG tablet Take 0.5 tablets (50 mg total) by mouth daily. 45 tablet 2  . Multiple Vitamins-Minerals (PRESERVISION AREDS 2) CAPS Take 1 capsule by mouth 2 (two) times daily.    . nitroGLYCERIN (NITROLINGUAL) 0.4 MG/SPRAY spray Place 1 spray under the tongue every 5 (five) minutes x 3 doses as needed for chest pain.      No facility-administered medications prior to visit.     ROS Review of Systems  Constitutional: Negative for appetite change, fatigue and unexpected weight change.  HENT: Negative for congestion, nosebleeds, sneezing, sore throat and trouble swallowing.   Eyes: Negative for itching and visual disturbance.    Respiratory: Negative for cough.   Cardiovascular: Negative for chest pain, palpitations and leg swelling.  Gastrointestinal: Negative for abdominal distention, blood in stool, diarrhea and nausea.  Genitourinary: Negative for frequency and hematuria.  Musculoskeletal: Positive for arthralgias and gait problem. Negative for back pain, joint swelling and neck pain.  Skin: Negative for rash.  Neurological: Negative for dizziness, tremors, speech difficulty and weakness.  Psychiatric/Behavioral: Negative for agitation, dysphoric mood and sleep disturbance. The patient is not nervous/anxious.     Objective:  There were no vitals taken for this visit.  BP Readings from Last 3 Encounters:  09/18/16 110/66  09/01/16 116/72  08/01/16 116/60    Wt Readings from Last 3 Encounters:  09/18/16 166 lb 12.8 oz (75.7 kg)  09/01/16 165 lb (74.8 kg)  08/01/16 164 lb 6.4 oz (74.6 kg)    Physical Exam  Constitutional: He is oriented to person, place, and time. He appears well-developed. No distress.  NAD  HENT:  Mouth/Throat: Oropharynx is clear and moist.  Eyes: Conjunctivae are normal. Pupils are equal, round, and reactive to light.  Neck: Normal range of motion. No JVD present. No thyromegaly present.  Cardiovascular: Normal rate, regular rhythm, normal heart sounds and intact distal pulses.  Exam reveals no gallop and no friction rub.   No murmur heard. Pulmonary/Chest: Effort  normal and breath sounds normal. No respiratory distress. He has no wheezes. He has no rales. He exhibits no tenderness.  Abdominal: Soft. Bowel sounds are normal. He exhibits no distension and no mass. There is no tenderness. There is no rebound and no guarding.  Musculoskeletal: Normal range of motion. He exhibits tenderness. He exhibits no edema.  Lymphadenopathy:    He has no cervical adenopathy.  Neurological: He is alert and oriented to person, place, and time. He has normal reflexes. No cranial nerve deficit. He  exhibits normal muscle tone. He displays a negative Romberg sign. Coordination and gait normal.  Skin: Skin is warm and dry. No rash noted.  Psychiatric: He has a normal mood and affect. His behavior is normal. Judgment and thought content normal.  R knee hurts w/ROM Pt declined rectal exam   Procedure Note :     Procedure : Joint Injection, R  knee   Indication:  Joint osteoarthritis with refractory  chronic pain.   Risks including unsuccessful procedure , bleeding, infection, bruising, skin atrophy, "steroid flare-up" and others were explained to the patient in detail as well as the benefits. Informed consent was obtained and signed.   Tthe patient was placed in a comfortable position. Lateral approach was used. Skin was prepped with Betadine and alcohol  and anesthetized a cooling spray. Then, a 5 cc syringe with a 1.5 inch long 25-gauge needle was used for a joint injection.. The needle was advanced  Into the knee joint cavity. I aspirated a small amount of intra-articular fluid to confirm correct placement of the needle and injected the joint with 5 mL of 2% lidocaine and 40 mg of Depo-Medrol .  Band-Aid was applied.   Tolerated well. Complications: None. Good pain relief following the procedure.     Lab Results  Component Value Date   WBC 8.1 04/04/2016   HGB 11.9 (L) 04/04/2016   HCT 35.1 (L) 04/04/2016   PLT 227 04/04/2016   GLUCOSE 118 (H) 09/18/2016   CHOL 92 (L) 09/18/2016   TRIG 163 (H) 09/18/2016   HDL 27 (L) 09/18/2016   LDLDIRECT 50.1 04/28/2011   LDLCALC 32 09/18/2016   ALT 17 09/18/2016   AST 17 09/18/2016   NA 140 09/18/2016   K 4.5 09/18/2016   CL 101 09/18/2016   CREATININE 1.27 09/18/2016   BUN 23 09/18/2016   CO2 21 09/18/2016   TSH 2.94 05/04/2015   PSA 2.17 07/29/2013   INR 1.0 03/25/2016   HGBA1C 6.2 02/13/2010    No results found.  Assessment & Plan:   There are no diagnoses linked to this encounter. I am having [name] [name] maintain his  nitroGLYCERIN, Cholecalciferol, aspirin, PRESERVISION AREDS 2, isosorbide mononitrate, loratadine, famotidine, atorvastatin, digoxin, hydrochlorothiazide, losartan, clopidogrel, carvedilol, and losartan.  No orders of the defined types were placed in this encounter.    Follow-up: No Follow-up on file.  Walker Kehr, MD

## 2016-12-24 NOTE — Patient Instructions (Addendum)
Postprocedure instructions :    A Band-Aid should be left on for 12 hours. Injection therapy is not a cure itself. It is used in conjunction with other modalities. You can use nonsteroidal anti-inflammatories like ibuprofen , hot and cold compresses. Rest is recommended in the next 24 hours. You need to report immediately  if fever, chills or any signs of infection develop.   Health Maintenance, Male A healthy lifestyle and preventive care is important for your health and wellness. Ask your health care provider about what schedule of regular examinations is right for you. What should I know about weight and diet?  Eat a Healthy Diet  Eat plenty of vegetables, fruits, whole grains, low-fat dairy products, and lean protein.  Do not eat a lot of foods high in solid fats, added sugars, or salt. Maintain a Healthy Weight  Regular exercise can help you achieve or maintain a healthy weight. You should:  Do at least 150 minutes of exercise each week. The exercise should increase your heart rate and make you sweat (moderate-intensity exercise).  Do strength-training exercises at least twice a week. Watch Your Levels of Cholesterol and Blood Lipids  Have your blood tested for lipids and cholesterol every 5 years starting at 81 years of age. If you are at high risk for heart disease, you should start having your blood tested when you are 81 years old. You may need to have your cholesterol levels checked more often if:  Your lipid or cholesterol levels are high.  You are older than 81 years of age.  You are at high risk for heart disease. What should I know about cancer screening? Many types of cancers can be detected early and may often be prevented. Lung Cancer  You should be screened every year for lung cancer if:  You are a current smoker who has smoked for at least 30 years.  You are a former smoker who has quit within the past 15 years.  Talk to your health care provider about your  screening options, when you should start screening, and how often you should be screened. Colorectal Cancer  Routine colorectal cancer screening usually begins at 81 years of age and should be repeated every 5-10 years until you are 81 years old. You may need to be screened more often if early forms of precancerous polyps or small growths are found. Your health care provider may recommend screening at an earlier age if you have risk factors for colon cancer.  Your health care provider may recommend using home test kits to check for hidden blood in the stool.  A small camera at the end of a tube can be used to examine your colon (sigmoidoscopy or colonoscopy). This checks for the earliest forms of colorectal cancer. Prostate and Testicular Cancer  Depending on your age and overall health, your health care provider may do certain tests to screen for prostate and testicular cancer.  Talk to your health care provider about any symptoms or concerns you have about testicular or prostate cancer. Skin Cancer  Check your skin from head to toe regularly.  Tell your health care provider about any new moles or changes in moles, especially if:  There is a change in a mole's size, shape, or color.  You have a mole that is larger than a pencil eraser.  Always use sunscreen. Apply sunscreen liberally and repeat throughout the day.  Protect yourself by wearing long sleeves, pants, a wide-brimmed hat, and sunglasses when outside. What  should I know about heart disease, diabetes, and high blood pressure?  If you are 22-70 years of age, have your blood pressure checked every 3-5 years. If you are 15 years of age or older, have your blood pressure checked every year. You should have your blood pressure measured twice-once when you are at a hospital or clinic, and once when you are not at a hospital or clinic. Record the average of the two measurements. To check your blood pressure when you are not at a  hospital or clinic, you can use:  An automated blood pressure machine at a pharmacy.  A home blood pressure monitor.  Talk to your health care provider about your target blood pressure.  If you are between 79-29 years old, ask your health care provider if you should take aspirin to prevent heart disease.  Have regular diabetes screenings by checking your fasting blood sugar level.  If you are at a normal weight and have a low risk for diabetes, have this test once every three years after the age of 78.  If you are overweight and have a high risk for diabetes, consider being tested at a younger age or more often.  A one-time screening for abdominal aortic aneurysm (AAA) by ultrasound is recommended for men aged 2-75 years who are current or former smokers. What should I know about preventing infection? Hepatitis B  If you have a higher risk for hepatitis B, you should be screened for this virus. Talk with your health care provider to find out if you are at risk for hepatitis B infection. Hepatitis C  Blood testing is recommended for:  Everyone born from [birth year] through 1963.  Anyone with known risk factors for hepatitis C. Sexually Transmitted Diseases (STDs)  You should be screened each year for STDs including gonorrhea and chlamydia if:  You are sexually active and are younger than 81 years of age.  You are older than 81 years of age and your health care provider tells you that you are at risk for this type of infection.  Your sexual activity has changed since you were last screened and you are at an increased risk for chlamydia or gonorrhea. Ask your health care provider if you are at risk.  Talk with your health care provider about whether you are at high risk of being infected with HIV. Your health care provider may recommend a prescription medicine to help prevent HIV infection. What else can I do?  Schedule regular health, dental, and eye exams.  Stay current with your  vaccines (immunizations).  Do not use any tobacco products, such as cigarettes, chewing tobacco, and e-cigarettes. If you need help quitting, ask your health care provider.  Limit alcohol intake to no more than 2 drinks per day. One drink equals 12 ounces of beer, 5 ounces of wine, or 1 ounces of hard liquor.  Do not use street drugs.  Do not share needles.  Ask your health care provider for help if you need support or information about quitting drugs.  Tell your health care provider if you often feel depressed.  Tell your health care provider if you have ever been abused or do not feel safe at home. This information is not intended to replace advice given to you by your health care provider. Make sure you discuss any questions you have with your health care provider. Document Released: 02/28/2008 Document Revised: 04/30/2016 Document Reviewed: 06/05/2015 Elsevier Interactive Patient Education  2017 [employer].

## 2016-12-24 NOTE — Progress Notes (Signed)
Pre visit review using our clinic review tool, if applicable. No additional management support is needed unless otherwise documented below in the visit note. 

## 2016-12-24 NOTE — Addendum Note (Signed)
Addended by: Karren Cobble on: 12/24/2016 11:25 AM   Modules accepted: Orders

## 2016-12-24 NOTE — Assessment & Plan Note (Signed)
Injection offered 

## 2016-12-25 ENCOUNTER — Telehealth: Payer: Self-pay | Admitting: Internal Medicine

## 2016-12-25 NOTE — Telephone Encounter (Signed)
Patient called back in regard to message left.  Gave patient MD response on labs.

## 2017-01-10 DIAGNOSIS — G4733 Obstructive sleep apnea (adult) (pediatric): Secondary | ICD-10-CM | POA: Diagnosis not present

## 2017-01-15 DIAGNOSIS — G4733 Obstructive sleep apnea (adult) (pediatric): Secondary | ICD-10-CM | POA: Diagnosis not present

## 2017-02-02 ENCOUNTER — Ambulatory Visit (INDEPENDENT_AMBULATORY_CARE_PROVIDER_SITE_OTHER): Payer: Medicare Other | Admitting: *Deleted

## 2017-02-02 DIAGNOSIS — I255 Ischemic cardiomyopathy: Secondary | ICD-10-CM | POA: Diagnosis not present

## 2017-02-02 NOTE — Progress Notes (Signed)
Remote ICD transmission.   

## 2017-02-04 ENCOUNTER — Encounter: Payer: Self-pay | Admitting: Cardiology

## 2017-02-04 LAB — CUP PACEART REMOTE DEVICE CHECK
Battery Remaining Longevity: 61 mo
Brady Statistic AP VS Percent: 90 %
Brady Statistic AS VP Percent: 1 %
Brady Statistic AS VS Percent: 4.8 %
Brady Statistic RV Percent Paced: 4.8 %
Date Time Interrogation Session: 20180521060017
HIGH POWER IMPEDANCE MEASURED VALUE: 82 Ohm
HighPow Impedance: 82 Ohm
Implantable Lead Implant Date: 20080711
Implantable Lead Location: 753860
Implantable Lead Model: 7122
Lead Channel Impedance Value: 350 Ohm
Lead Channel Pacing Threshold Amplitude: 0.5 V
Lead Channel Pacing Threshold Pulse Width: 0.5 ms
Lead Channel Sensing Intrinsic Amplitude: 12 mV
Lead Channel Sensing Intrinsic Amplitude: 2.4 mV
Lead Channel Setting Pacing Amplitude: 1.5 V
Lead Channel Setting Sensing Sensitivity: 0.5 mV
MDC IDC LEAD IMPLANT DT: 20080711
MDC IDC LEAD LOCATION: 753859
MDC IDC MSMT BATTERY REMAINING PERCENTAGE: 71 %
MDC IDC MSMT BATTERY VOLTAGE: 2.95 V
MDC IDC MSMT LEADCHNL RV IMPEDANCE VALUE: 390 Ohm
MDC IDC MSMT LEADCHNL RV PACING THRESHOLD AMPLITUDE: 0.75 V
MDC IDC MSMT LEADCHNL RV PACING THRESHOLD PULSEWIDTH: 0.5 ms
MDC IDC PG IMPLANT DT: 20151007
MDC IDC PG SERIAL: 7225183
MDC IDC SET LEADCHNL RV PACING AMPLITUDE: 2.5 V
MDC IDC SET LEADCHNL RV PACING PULSEWIDTH: 0.5 ms
MDC IDC STAT BRADY AP VP PERCENT: 4.8 %
MDC IDC STAT BRADY RA PERCENT PACED: 94 %

## 2017-02-09 DIAGNOSIS — G4733 Obstructive sleep apnea (adult) (pediatric): Secondary | ICD-10-CM | POA: Diagnosis not present

## 2017-03-03 ENCOUNTER — Other Ambulatory Visit: Payer: Self-pay | Admitting: Physician Assistant

## 2017-03-03 NOTE — Telephone Encounter (Signed)
Refill Request.  

## 2017-03-12 DIAGNOSIS — G4733 Obstructive sleep apnea (adult) (pediatric): Secondary | ICD-10-CM | POA: Diagnosis not present

## 2017-03-16 ENCOUNTER — Telehealth: Payer: Self-pay | Admitting: Cardiovascular Disease

## 2017-03-16 DIAGNOSIS — R0602 Shortness of breath: Secondary | ICD-10-CM

## 2017-03-16 NOTE — Telephone Encounter (Signed)
Spoke with patient who states he has bilateral foot swelling. He states his ankles and lower legs are slightly swollen; states noticed the swelling yesterday. Denies weight gain; states weight has been stable at 167 lb on home scale. He states he tries to walk around to get the swelling to go down and he has pain after walking. He denies decrease in urination and denies SOB. I asked about leg elevation and he states he mostly sits in a recliner. I advised him to elevate his legs above his heart several times through the day to see if this helps. He states he does not wear support stockings. We discussed diet and he reports eating at home except for approximately 3 meals per week which are eaten in a restaurant. He states he monitors salt content. He was not aware that he takes a diuretic (HCTZ). He has not had a recent echo but last EF measured during cardiac cath in 7/17 was 35-45%. I advised that I will forward to Dr. Acie Fredrickson for additional advice. He verbalized understanding and agreement with plan and thanked me for the call.

## 2017-03-16 NOTE — Telephone Encounter (Signed)
New message    Pt c/o swelling: STAT is pt has developed SOB within 24 hours  1. How long have you been experiencing swelling? Since today  2. Where is the swelling located? feet  3.  Are you currently taking a "fluid pill"? no  4.  Are you currently SOB? no  5.  Have you traveled recently? no

## 2017-03-17 NOTE — Telephone Encounter (Signed)
Spoke with patient and reviewed Dr. Elmarie Shiley advice with him. He verbalized understanding and agreement with plan. I scheduled him for echo on 7/16 and advised him to call back if symptoms worsen prior to that appointment. He thanked me for the call.

## 2017-03-17 NOTE — Telephone Encounter (Signed)
Lets get an echo to evaluate his cardiac function He needs to watch his salt closely and elevated his legs more often Will discuss changing the HCTZ to Lasix at his next office visit in august Please have him call sooner if the leg swelling does not improve

## 2017-03-30 ENCOUNTER — Other Ambulatory Visit: Payer: Self-pay

## 2017-03-30 ENCOUNTER — Ambulatory Visit (HOSPITAL_COMMUNITY): Payer: Medicare Other | Attending: Cardiovascular Disease

## 2017-03-30 DIAGNOSIS — I517 Cardiomegaly: Secondary | ICD-10-CM | POA: Diagnosis not present

## 2017-03-30 DIAGNOSIS — I340 Nonrheumatic mitral (valve) insufficiency: Secondary | ICD-10-CM | POA: Insufficient documentation

## 2017-03-30 DIAGNOSIS — R0602 Shortness of breath: Secondary | ICD-10-CM | POA: Insufficient documentation

## 2017-04-11 DIAGNOSIS — G4733 Obstructive sleep apnea (adult) (pediatric): Secondary | ICD-10-CM | POA: Diagnosis not present

## 2017-04-23 DIAGNOSIS — G4733 Obstructive sleep apnea (adult) (pediatric): Secondary | ICD-10-CM | POA: Diagnosis not present

## 2017-04-27 ENCOUNTER — Encounter: Payer: Self-pay | Admitting: *Deleted

## 2017-05-04 ENCOUNTER — Ambulatory Visit (INDEPENDENT_AMBULATORY_CARE_PROVIDER_SITE_OTHER): Payer: Medicare Other | Admitting: *Deleted

## 2017-05-04 DIAGNOSIS — I255 Ischemic cardiomyopathy: Secondary | ICD-10-CM | POA: Diagnosis not present

## 2017-05-05 NOTE — Progress Notes (Signed)
Remote ICD transmission.   

## 2017-05-07 LAB — CUP PACEART REMOTE DEVICE CHECK
Battery Voltage: 2.95 V
Brady Statistic AP VP Percent: 4.7 %
Brady Statistic AS VP Percent: 1 %
Brady Statistic RA Percent Paced: 94 %
Brady Statistic RV Percent Paced: 4.7 %
HighPow Impedance: 81 Ohm
HighPow Impedance: 81 Ohm
Implantable Lead Implant Date: 20080711
Implantable Lead Implant Date: 20080711
Implantable Lead Location: 753859
Implantable Lead Location: 753860
Implantable Lead Model: 7122
Lead Channel Impedance Value: 380 Ohm
Lead Channel Pacing Threshold Pulse Width: 0.5 ms
Lead Channel Sensing Intrinsic Amplitude: 12 mV
Lead Channel Setting Pacing Amplitude: 1.625
Lead Channel Setting Pacing Amplitude: 2.5 V
MDC IDC MSMT BATTERY REMAINING LONGEVITY: 58 mo
MDC IDC MSMT BATTERY REMAINING PERCENTAGE: 69 %
MDC IDC MSMT LEADCHNL RA PACING THRESHOLD AMPLITUDE: 0.625 V
MDC IDC MSMT LEADCHNL RA SENSING INTR AMPL: 1.9 mV
MDC IDC MSMT LEADCHNL RV IMPEDANCE VALUE: 390 Ohm
MDC IDC MSMT LEADCHNL RV PACING THRESHOLD AMPLITUDE: 0.75 V
MDC IDC MSMT LEADCHNL RV PACING THRESHOLD PULSEWIDTH: 0.5 ms
MDC IDC PG IMPLANT DT: 20151007
MDC IDC PG SERIAL: 7225183
MDC IDC SESS DTM: 20180820060015
MDC IDC SET LEADCHNL RV PACING PULSEWIDTH: 0.5 ms
MDC IDC SET LEADCHNL RV SENSING SENSITIVITY: 0.5 mV
MDC IDC STAT BRADY AP VS PERCENT: 91 %
MDC IDC STAT BRADY AS VS PERCENT: 4.6 %

## 2017-05-13 ENCOUNTER — Encounter: Payer: Self-pay | Admitting: Cardiovascular Disease

## 2017-05-13 ENCOUNTER — Encounter (INDEPENDENT_AMBULATORY_CARE_PROVIDER_SITE_OTHER): Payer: Self-pay

## 2017-05-13 ENCOUNTER — Ambulatory Visit (INDEPENDENT_AMBULATORY_CARE_PROVIDER_SITE_OTHER): Payer: Medicare Other | Admitting: Cardiovascular Disease

## 2017-05-13 VITALS — BP 100/50 | HR 70 | Ht 66.0 in | Wt 161.4 lb

## 2017-05-13 DIAGNOSIS — I2510 Atherosclerotic heart disease of native coronary artery without angina pectoris: Secondary | ICD-10-CM | POA: Diagnosis not present

## 2017-05-13 DIAGNOSIS — I5022 Chronic systolic (congestive) heart failure: Secondary | ICD-10-CM

## 2017-05-13 NOTE — Progress Notes (Signed)
Cardiology Office Note   Date:  05/13/2017   ID:  [name] [name], DOB 1929-04-19, MRN [id number]  PCP:  Cassandria Anger, MD  Cardiologist:  Mertie Moores, MD   Problem list 1. Coronary artery disease-status post coronary artery bypass grafting 2. Hyperlipidemia 3. Chronic systolic congestive heart failure - EF 35-40% 4. ICD -  5.  OSA - Fransico Him, MD   Chief Complaint  Patient presents with  . Coronary Artery Disease      History of Present Illness: [name] [name] is a 83 y.o. male who presents today to follow-up coronary disease and ischemic cardiomyopathy and a history of hypertension. He did have an episode of syncope in the past that was related to hypotension. More recently his blood pressure has been elevated. He was hesitant to have his ramapril dose increased because he associated this with his syncopal episode in the past. Losartan was added to his medications. He is getting excellent control and I have left [name] on both. He feels good. There is no chest pain. He is fully active. He has an ICD in place that is followed carefully by electrophysiology.  Retired [employer] ( [place] )   Nov. 1, 2016:  Doing well.  No CP or dyspnea. 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   January 08, 2016:  See for follow up for his CAD / CABG, chronic systolic CHF , HTN Doing well.    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   Stays active  Does not ride anymore.  Has some fatigue , no CP   Jan. 4, 2018:  Still taking care of his horses.   Feeds and hay twice  No CP , no dyspnea.   Aug. 29, 2018:  [name] is doing well from a cardiac standpoint  Has leg pain . Still taking care of his 4 horses ( 2 are [place] from the [employer] )   Past Medical History:  Diagnosis Date  . AICD (automatic cardioverter/defibrillator) present   . Arrhythmia   . Arthritis    "hands, legs" (03/26/2016)  . CAD (coronary artery disease)    hx apical  aneurysm/cath 05/2005, old occluded graft to RCA, no change/ cath 02/2007 no change, myoview 06/2009 old scar, no ischemia EF 43%, EF 35-40%-echo 05/2008 [facility] periapical wall //  LHC 7/17: mLAD 100, oRI 99, pRCA 100, L-LAD ok, S-RI 100, S-RCA 100, EF 35-45% >> PCI: DES x 2 to RI  . Chronic systolic CHF (congestive heart failure) ([place])    a. Echo 9/14: Mild LVH, mild focal basal septal hypertrophy, EF 35-40%, anteroseptal HK, normal diastolic function, mild MR, mild LAE, PASP 34 mmHg  . Chronotropic incompetence    treated w/pacemaker, ICD 7.2008. This was placed after CPX done post 2008 cath. CPX showed only chronotropic incompetence  . History of blood transfusion 1969   "after getting MSO4; it destroys my corpuscles" (03/26/2016)  . History of colon polyps   . Hyperlipidemia   . Hypertension   . Migraine    "just before my bypass" (03/26/2016)  . Mitral regurgitation    mild  . Myocardial infarction ([place]) "several"  . Numbness and tingling of left arm and leg    improved after tx w/prednisone  . OSA (obstructive sleep apnea) 09/01/2016   Severe OSA with AHI 38/hr now on BiPAP at 21/17cm H2O  . Syncopal episodes    relative hypotension  Past Surgical History:  Procedure Laterality Date  . BACK SURGERY    . CARDIAC CATHETERIZATION  1980s-1990s X 4  . CARDIAC CATHETERIZATION N/A 03/26/2016   Procedure: Left Heart Cath and Cors/Grafts Angiography;  Surgeon: [name] Blanks, MD;  Location: [facility];  Service: Cardiovascular;  Laterality: N/A;  . CARDIAC CATHETERIZATION N/A 03/26/2016   Procedure: Coronary Stent Intervention;  Surgeon: [name] Blanks, MD;  Location: [facility];  Service: Cardiovascular;  Laterality: N/A;  . CARDIAC DEFIBRILLATOR PLACEMENT  2008  . CATARACT EXTRACTION W/ INTRAOCULAR LENS  IMPLANT, BILATERAL Bilateral 08/2013 - 09/2013   right - left   . CORONARY ANGIOPLASTY WITH STENT PLACEMENT  ~ 1992; 03/26/2016   "1 + 2"  . CORONARY  ARTERY BYPASS GRAFT  1992   CABG "X4"  . CYSTOSCOPY  1980s  . IMPLANTABLE CARDIOVERTER DEFIBRILLATOR (ICD) GENERATOR CHANGE N/A 06/21/2014   Procedure: ICD GENERATOR CHANGE;  Surgeon: [name] Sprang, MD;  Location: [facility];  Service: Cardiovascular;  Laterality: N/A;  . POSTERIOR LUMBAR FUSION  ~ 1989   "fused 2 discs"  . TIBIA FRACTURE SURGERY Left 1943   "put artificial bone in there"    Patient Active Problem List   Diagnosis Date Noted  . Chronic systolic CHF (congestive heart failure) ([place]) 11/18/2013    Priority: High  . CAD (coronary artery disease)     Priority: High  . Knee pain, right 12/24/2016  . OSA (obstructive sleep apnea) 09/01/2016  . Status post coronary artery stent placement   . Hypertensive heart disease with heart failure ([place])   . Unstable angina ([place])   . Thumb pain 05/04/2015  . Ejection fraction   . Well adult exam 07/13/2012  . Chronic renal insufficiency, stage III (moderate) 07/21/2011  . Ischemic cardiomyopathy   . Hx of CABG   . Chronotropic incompetence   . Syncopal episodes   . Mitral regurgitation   . Numbness and tingling of left arm and leg   . PARESTHESIA 10/22/2009  . TOBACCO USE, QUIT 10/22/2009  . SEBACEOUS CYST 08/27/2009  . Anemia, chronic disease 02/18/2008  . Dyslipidemia 05/03/2007  . Hypertensive heart disease 05/03/2007  . COLONIC POLYPS, HX OF 05/03/2007  . Automatic implantable cardioverter-defibrillator in situ 03/16/2007      Current Outpatient Prescriptions  Medication Sig Dispense Refill  . aspirin 81 MG tablet Take 1 tablet (81 mg total) by mouth daily. 30 tablet 11  . atorvastatin (LIPITOR) 40 MG tablet TAKE 1 TABLET DAILY 90 tablet 3  . carvedilol (COREG) 25 MG tablet Take 0.5 tablets (12.5 mg total) by mouth 2 (two) times daily. 90 tablet 3  . Cholecalciferol 1000 UNITS tablet Take 1,000 Units by mouth daily.      . clopidogrel (PLAVIX) 75 MG tablet Take 1 tablet (75 mg total) by mouth daily. 90 tablet 3  .  digoxin (LANOXIN) 0.125 MG tablet TAKE ONE-HALF TABLET (0.0625 MG) DAILY 45 tablet 3  . hydrochlorothiazide (MICROZIDE) 12.5 MG capsule Take 1 capsule (12.5 mg total) by mouth daily. 90 capsule 3  . isosorbide mononitrate (IMDUR) 30 MG 24 hr tablet TAKE ONE-HALF (1/2) TABLET DAILY 45 tablet 3  . loratadine (CLARITIN) 10 MG tablet Take 1 tablet (10 mg total) by mouth daily. 10 tablet 0  . losartan (COZAAR) 100 MG tablet Take 0.5 tablets (50 mg total) by mouth daily. 45 tablet 2  . Multiple Vitamins-Minerals (PRESERVISION AREDS 2) CAPS Take 1 capsule by mouth 2 (two) times  daily.    . nitroGLYCERIN (NITROLINGUAL) 0.4 MG/SPRAY spray Place 1 spray under the tongue every 5 (five) minutes x 3 doses as needed for chest pain.      No current facility-administered medications for this visit.     Allergies:   Morphine and Ramipril    Social History:  The patient  reports that he has quit smoking. His smoking use included Cigars. He quit after 29.00 years of use. He has never used smokeless tobacco. He reports that he drinks alcohol. He reports that he does not use drugs.   Family History:  The patient's family history includes Colon cancer in his other; Hypertension in his father, mother, and other.    ROS:  Please see the history of present illness.     Patient denies fever, chills, headache, sweats, rash, change in vision, change in hearing, chest pain, cough, nausea or vomiting, urinary symptoms. All other systems are reviewed and are negative.   PHYSICAL EXAM: VS:  BP (!) 100/50   Pulse 70   Ht 5\' 6"  (1.676 m)   Wt 161 lb 6.4 oz (73.2 kg)   SpO2 93%   BMI 26.05 kg/m  , Patient is stable. He looks good. He is oriented to person time and place. Affect is normal. Head is atraumatic. Sclera and conjunctiva are normal. There is no jugular venous distention.  Lungs are clear. Respiratory effort is nonlabored.  Cardiac exam reveals S1 and S2.  Soft systolic murmur  Abdomen is soft and there is no  peripheral edema. There are no musculoskeletal deformities. There are no skin rashes.     Neurologic is grossly intact.  EKG:   EKG is  done today. Aug. 29, 2018:   A paced with prolonged AV conduction.  HR of 70 .   Recent Labs: 12/24/2016: ALT 14; BUN 18; Creatinine, Ser 1.11; Hemoglobin 12.8; Platelets 224.0; Potassium 4.4; Sodium 139; TSH 3.71    Lipid Panel    Component Value Date/Time   CHOL 101 12/24/2016 1103   CHOL 92 (L) 09/18/2016 0844   TRIG 209.0 (H) 12/24/2016 1103   HDL 27.40 (L) 12/24/2016 1103   HDL 27 (L) 09/18/2016 0844   CHOLHDL 4 12/24/2016 1103   VLDL 41.8 (H) 12/24/2016 1103   LDLCALC 32 09/18/2016 0844   LDLDIRECT 35.0 12/24/2016 1103      Wt Readings from Last 3 Encounters:  05/13/17 161 lb 6.4 oz (73.2 kg)  12/24/16 163 lb (73.9 kg)  09/18/16 166 lb 12.8 oz (75.7 kg)      Current medicines are reviewed  The patient understands his medications.   ASSESSMENT AND PLAN:  1. Coronary artery disease-status post coronary artery bypass grafting - no angina .  Continue current meds  2. Hyperlipidemia -  continue atorvastatin , labs in April 2018 look good   3. Chronic systolic congestive heart failure - EF 35-40% - continue meds. On coreg and Losartan   Avoids salt  Will DC digoxin - no longer needed.   4. ICD -  Followed by Dr. Caryl Comes   5. Obstructive sleep apnea - followed by Dr. Ivin Poot, MD  05/13/2017 9:50 AM    [address],  [address] [place], [place]  [address] Pager [phone] Phone: [phone]; Fax: [phone]

## 2017-05-13 NOTE — Patient Instructions (Addendum)
Medication Instructions:  STOP Digoxin (Lanoxin)   Labwork: Your physician recommends that you return for lab work in: 6 months on the day of or a few days before your office visit with Dr. Acie Fredrickson.  You will need to FAST for this appointment - nothing to eat or drink after midnight the night before except water.    Testing/Procedures: None Ordered   Follow-Up: Your physician wants you to follow-up in: 6 months with Dr. Acie Fredrickson.  You will receive a reminder letter in the mail two months in advance. If you don't receive a letter, please call our office to schedule the follow-up appointment.   If you need a refill on your cardiac medications before your next appointment, please call your pharmacy.   Thank you for choosing [facility]! Christen Bame, RN [phone]

## 2017-05-15 ENCOUNTER — Encounter: Payer: Self-pay | Admitting: Cardiology

## 2017-06-04 ENCOUNTER — Telehealth: Payer: Self-pay | Admitting: Cardiovascular Disease

## 2017-06-04 NOTE — Telephone Encounter (Signed)
New message    Pt is calling stating that since his cath he has been having troubles with his left arm. He said it hurts where the cath was done and it's hard to left things with that arm. Please call.

## 2017-06-04 NOTE — Telephone Encounter (Signed)
Agree that this sounds like an orthopedic issue

## 2017-06-04 NOTE — Telephone Encounter (Signed)
I spoke with pt. He reports pain in muscle in left arm below elbow. Started about a month and a half ago.  It hurts when he uses arm and when he writes. Thinks it is getting worse.   No numbness or tingling. Also reports knot near elbow area.  Tylenol helps with pain. As last cath was July 2017 I asked pt to contact primary care regarding this.

## 2017-06-05 ENCOUNTER — Encounter: Payer: Self-pay | Admitting: Internal Medicine

## 2017-06-05 ENCOUNTER — Ambulatory Visit (INDEPENDENT_AMBULATORY_CARE_PROVIDER_SITE_OTHER): Payer: Medicare Other | Admitting: Internal Medicine

## 2017-06-05 ENCOUNTER — Ambulatory Visit (INDEPENDENT_AMBULATORY_CARE_PROVIDER_SITE_OTHER)
Admission: RE | Admit: 2017-06-05 | Discharge: 2017-06-05 | Disposition: A | Discharge status: Home / Self Care | Payer: Medicare Other | Source: Ambulatory Visit | Attending: Internal Medicine | Admitting: Internal Medicine

## 2017-06-05 VITALS — BP 112/68 | HR 76 | Temp 98.1°F | Ht 66.0 in | Wt 165.0 lb

## 2017-06-05 DIAGNOSIS — I2510 Atherosclerotic heart disease of native coronary artery without angina pectoris: Secondary | ICD-10-CM | POA: Diagnosis not present

## 2017-06-05 DIAGNOSIS — M7989 Other specified soft tissue disorders: Secondary | ICD-10-CM | POA: Diagnosis not present

## 2017-06-05 DIAGNOSIS — E785 Hyperlipidemia, unspecified: Secondary | ICD-10-CM | POA: Diagnosis not present

## 2017-06-05 DIAGNOSIS — M79632 Pain in left forearm: Secondary | ICD-10-CM | POA: Diagnosis not present

## 2017-06-05 DIAGNOSIS — Z23 Encounter for immunization: Secondary | ICD-10-CM

## 2017-06-05 DIAGNOSIS — I255 Ischemic cardiomyopathy: Secondary | ICD-10-CM | POA: Diagnosis not present

## 2017-06-05 DIAGNOSIS — M79602 Pain in left arm: Secondary | ICD-10-CM | POA: Diagnosis not present

## 2017-06-05 NOTE — Assessment & Plan Note (Addendum)
?  etiology  L forearm pain, L wrist pain, numbness x months. He is L handed: it is hard to eat. It feels colder too. He thinks it started in July 2017 when he had a heart cath done There are two subcutaneous lumps - one distally over radius head on palmar surface and same on prox ulnar forearm.  Sports med ref - hope [facility] [place] will help to clarify the issue

## 2017-06-05 NOTE — Assessment & Plan Note (Signed)
Lipitor 

## 2017-06-05 NOTE — Assessment & Plan Note (Signed)
Imdur, Lipitor, Coreg, ASA No CP

## 2017-06-05 NOTE — Progress Notes (Addendum)
Subjective:  Patient ID: [name] [name], male    DOB: 12/04/1928  Age: 83 y.o. MRN: [id number]  CC: No chief complaint on file.   HPI JAYCION TREML presents for L forearm pain, L wrist pain, numbness x months. He is L handed: it is hard to eat. It feels colder too. He thinks it started in July 2017 when he had a heart cath done. F/u CAD, dyslipidemia  Outpatient Medications Prior to Visit  Medication Sig Dispense Refill  . aspirin 81 MG tablet Take 1 tablet (81 mg total) by mouth daily. 30 tablet 11  . atorvastatin (LIPITOR) 40 MG tablet TAKE 1 TABLET DAILY 90 tablet 3  . carvedilol (COREG) 25 MG tablet Take 0.5 tablets (12.5 mg total) by mouth 2 (two) times daily. 90 tablet 3  . Cholecalciferol 1000 UNITS tablet Take 1,000 Units by mouth daily.      . clopidogrel (PLAVIX) 75 MG tablet Take 1 tablet (75 mg total) by mouth daily. 90 tablet 3  . hydrochlorothiazide (MICROZIDE) 12.5 MG capsule Take 1 capsule (12.5 mg total) by mouth daily. 90 capsule 3  . isosorbide mononitrate (IMDUR) 30 MG 24 hr tablet TAKE ONE-HALF (1/2) TABLET DAILY 45 tablet 3  . loratadine (CLARITIN) 10 MG tablet Take 1 tablet (10 mg total) by mouth daily. 10 tablet 0  . losartan (COZAAR) 100 MG tablet Take 0.5 tablets (50 mg total) by mouth daily. 45 tablet 2  . Multiple Vitamins-Minerals (PRESERVISION AREDS 2) CAPS Take 1 capsule by mouth 2 (two) times daily.    . nitroGLYCERIN (NITROLINGUAL) 0.4 MG/SPRAY spray Place 1 spray under the tongue every 5 (five) minutes x 3 doses as needed for chest pain.      No facility-administered medications prior to visit.     ROS Review of Systems  Constitutional: Negative for appetite change, fatigue and unexpected weight change.  HENT: Negative for congestion, nosebleeds, sneezing, sore throat and trouble swallowing.   Eyes: Negative for itching and visual disturbance.  Respiratory: Negative for cough.   Cardiovascular: Negative for chest pain, palpitations and leg swelling.    Gastrointestinal: Negative for abdominal distention, blood in stool, diarrhea and nausea.  Genitourinary: Negative for frequency and hematuria.  Musculoskeletal: Positive for arthralgias. Negative for back pain, gait problem, joint swelling and neck pain.  Skin: Negative for color change and rash.  Neurological: Negative for dizziness, tremors, speech difficulty and weakness.  Psychiatric/Behavioral: Negative for agitation, dysphoric mood and sleep disturbance. The patient is not nervous/anxious.     Objective:  BP 112/68 (BP Location: Right Arm, Patient Position: Sitting, Cuff Size: Large)   Pulse 76   Temp 98.1 F (36.7 C) (Oral)   Ht 5\' 6"  (1.676 m)   Wt 165 lb (74.8 kg)   SpO2 99%   BMI 26.63 kg/m   BP Readings from Last 3 Encounters:  06/05/17 112/68  05/13/17 (!) 100/50  12/24/16 132/66    Wt Readings from Last 3 Encounters:  06/05/17 165 lb (74.8 kg)  05/13/17 161 lb 6.4 oz (73.2 kg)  12/24/16 163 lb (73.9 kg)    Physical Exam  Constitutional: He is oriented to person, place, and time. He appears well-developed. No distress.  NAD  HENT:  Mouth/Throat: Oropharynx is clear and moist.  Eyes: Pupils are equal, round, and reactive to light. Conjunctivae are normal.  Neck: Normal range of motion. No JVD present. No thyromegaly present.  Cardiovascular: Normal rate, regular rhythm, normal heart sounds and intact distal pulses.  Exam reveals no gallop and no friction rub.   No murmur heard. Pulmonary/Chest: Effort normal and breath sounds normal. No respiratory distress. He has no wheezes. He has no rales. He exhibits no tenderness.  Abdominal: Soft. Bowel sounds are normal. He exhibits no distension and no mass. There is no tenderness. There is no rebound and no guarding.  Musculoskeletal: Normal range of motion. He exhibits tenderness. He exhibits no edema.  Lymphadenopathy:    He has no cervical adenopathy.  Neurological: He is alert and oriented to person, place, and  time. He has normal reflexes. No cranial nerve deficit. He exhibits normal muscle tone. He displays a negative Romberg sign. Coordination and gait normal.  Skin: Skin is warm and dry. No rash noted.  Psychiatric: He has a normal mood and affect. His behavior is normal. Judgment and thought content normal.  There are two subcutaneous lumps 2x3 cm - one distally over radius head on palmar surface and same on prox ulnar forearm.  Lab Results  Component Value Date   WBC 8.2 12/24/2016   HGB 12.8 (L) 12/24/2016   HCT 38.1 (L) 12/24/2016   PLT 224.0 12/24/2016   GLUCOSE 108 (H) 12/24/2016   CHOL 101 12/24/2016   TRIG 209.0 (H) 12/24/2016   HDL 27.40 (L) 12/24/2016   LDLDIRECT 35.0 12/24/2016   LDLCALC 32 09/18/2016   ALT 14 12/24/2016   AST 15 12/24/2016   NA 139 12/24/2016   K 4.4 12/24/2016   CL 101 12/24/2016   CREATININE 1.11 12/24/2016   BUN 18 12/24/2016   CO2 30 12/24/2016   TSH 3.71 12/24/2016   PSA 3.11 12/24/2016   INR 1.0 03/25/2016   HGBA1C 6.4 12/24/2016    No results found.  Assessment & Plan:   There are no diagnoses linked to this encounter. I am having Mr. Tucholski maintain his nitroGLYCERIN, Cholecalciferol, aspirin, PRESERVISION AREDS 2, loratadine, atorvastatin, hydrochlorothiazide, clopidogrel, carvedilol, losartan, and isosorbide mononitrate.  No orders of the defined types were placed in this encounter.    Follow-up: No Follow-up on file.  Walker Kehr, MD

## 2017-06-05 NOTE — Addendum Note (Signed)
Addended by: Karren Cobble on: 06/05/2017 01:53 PM   Modules accepted: Orders

## 2017-06-08 DIAGNOSIS — H33321 Round hole, right eye: Secondary | ICD-10-CM | POA: Diagnosis not present

## 2017-06-08 DIAGNOSIS — H524 Presbyopia: Secondary | ICD-10-CM | POA: Diagnosis not present

## 2017-06-16 ENCOUNTER — Telehealth: Payer: Self-pay | Admitting: Cardiovascular Disease

## 2017-06-16 DIAGNOSIS — H31092 Other chorioretinal scars, left eye: Secondary | ICD-10-CM | POA: Diagnosis not present

## 2017-06-16 DIAGNOSIS — H353132 Nonexudative age-related macular degeneration, bilateral, intermediate dry stage: Secondary | ICD-10-CM | POA: Diagnosis not present

## 2017-06-16 DIAGNOSIS — H33011 Retinal detachment with single break, right eye: Secondary | ICD-10-CM | POA: Diagnosis not present

## 2017-06-16 DIAGNOSIS — H43813 Vitreous degeneration, bilateral: Secondary | ICD-10-CM | POA: Diagnosis not present

## 2017-06-16 NOTE — Telephone Encounter (Signed)
.  [email or web address]

## 2017-06-16 NOTE — Telephone Encounter (Signed)
OK to hold Plavix and ASA for 3 days prior to retinal  surgery

## 2017-06-16 NOTE — Telephone Encounter (Signed)
° °  [facility] Pre-operative Risk Assessment    Request for surgical clearance:  1. What type of surgery is being performed? Retina   2. When is this surgery scheduled?  06/19/17  3. Are there any medications that need to be held prior to surgery and how long? Plavix (Patient's wife, Tawanna Sat, wanted to know if he could stop his Plavix for 3 days starting tomorrow, 06/17/14.)  4. Name of physician performing surgery?   5. What is your office phone and fax number?   6. Anesthesia type (None, local, MAC, general) ?   The patient's wife walked in and asked me for Plavix clearance while I was covering the greeter window this morning. I apologize for not having the answers to all of these questions - I was unable to open the SmartPhrase at the time she was in the office. I tried to call them back to ask questions 4-6 and they didn't answer.  Crystal D Kuhn 06/16/2017, 12:18 PM  _________________________________________________________________   (provider comments below)

## 2017-06-16 NOTE — Telephone Encounter (Signed)
    Chart reviewed 06/16/2017 in reference to pre-operative Plavix and ASA discontinuation for pending retinal surgery.  [name] [name] was last seen on 05/13/17 by Dr Acie Fredrickson. The pt was doing well at that time. He has a history of remote CABG in the 80's. His last PCI was a DES July 2017. I will rout this to Dr Acie Fredrickson for clearance to stop his ASA and Plavix pre op.   Kerin Ransom, PA-C 06/16/2017, 3:58 PM

## 2017-06-16 NOTE — Telephone Encounter (Signed)
Follow up     [facility] is calling to find out about clearance. She said pt wife came in earlier and was told to have them call.     [facility] Pre-operative Risk Assessment    Request for surgical clearance:  1. What type of surgery is being performed? Retinal detachment surgery   2. When is this surgery scheduled? 06/18/17   3. Are there any medications that need to be held prior to surgery and how long? Hold plavix and aspirin    4. Name of physician performing surgery? Dr. Sherlynn Stalls   5. What is your office phone and fax number? Phone-[phone] fax-[phone]  6. Anesthesia type (None, local, MAC, general) ? MAC   Ashland P Edwards 06/16/2017, 4:00 PM  _________________________________________________________________   (provider comments below)

## 2017-06-16 NOTE — Telephone Encounter (Signed)
    Chart reviewed as part of pre-operative protocol coverage. Patient contaced 06/16/2017 in reference to pre-operative risk assessment for pending surgery as outlined above.  FOUAD TAUL was last seen on 05/13/17 by Dr Acie Fredrickson.  Since that day, SABINO DENNING has done well, no chest pain.  Therefore, based on ACC/AHA guidelines, the patient would be at acceptable risk for the planned procedure without further cardiovascular testing. Dr Acie Fredrickson feels he can hold his Plavix three days pre op and resume ASAP post op.   Kerin Ransom, PA-C 06/16/2017, 4:46 PM

## 2017-06-17 ENCOUNTER — Encounter (HOSPITAL_COMMUNITY): Payer: Self-pay | Admitting: *Deleted

## 2017-06-17 ENCOUNTER — Other Ambulatory Visit: Payer: Self-pay | Admitting: Ophthalmology

## 2017-06-17 NOTE — Progress Notes (Addendum)
Spoke with pt for pre-op call. Pt has a cardiac history with CABG and last year having 2 stents placed. Pt denies chest pain or sob. Pt has cardiac clearance noted in EPIC. He was instructed to hold his Plavix and Aspirin for 3 days. Last dose was 06/16/17. Pt states he's not a diabetic.

## 2017-06-18 ENCOUNTER — Ambulatory Visit (HOSPITAL_COMMUNITY): Payer: Medicare Other | Admitting: Certified Registered Nurse Anesthetist

## 2017-06-18 ENCOUNTER — Encounter (HOSPITAL_COMMUNITY): Payer: Self-pay | Admitting: *Deleted

## 2017-06-18 ENCOUNTER — Encounter (HOSPITAL_COMMUNITY)
Admission: RE | Disposition: A | Discharge status: Home / Self Care | Payer: Self-pay | Source: Ambulatory Visit | Attending: Ophthalmology

## 2017-06-18 ENCOUNTER — Ambulatory Visit (HOSPITAL_COMMUNITY)
Admission: RE | Admit: 2017-06-18 | Discharge: 2017-06-18 | Disposition: A | Discharge status: Home / Self Care | Payer: Medicare Other | Source: Ambulatory Visit | Attending: Ophthalmology | Admitting: Ophthalmology

## 2017-06-18 DIAGNOSIS — Z951 Presence of aortocoronary bypass graft: Secondary | ICD-10-CM | POA: Diagnosis not present

## 2017-06-18 DIAGNOSIS — Z7982 Long term (current) use of aspirin: Secondary | ICD-10-CM | POA: Diagnosis not present

## 2017-06-18 DIAGNOSIS — Z955 Presence of coronary angioplasty implant and graft: Secondary | ICD-10-CM | POA: Diagnosis not present

## 2017-06-18 DIAGNOSIS — I2510 Atherosclerotic heart disease of native coronary artery without angina pectoris: Secondary | ICD-10-CM | POA: Insufficient documentation

## 2017-06-18 DIAGNOSIS — E785 Hyperlipidemia, unspecified: Secondary | ICD-10-CM | POA: Diagnosis not present

## 2017-06-18 DIAGNOSIS — Z79899 Other long term (current) drug therapy: Secondary | ICD-10-CM | POA: Insufficient documentation

## 2017-06-18 DIAGNOSIS — L723 Sebaceous cyst: Secondary | ICD-10-CM | POA: Diagnosis not present

## 2017-06-18 DIAGNOSIS — G4733 Obstructive sleep apnea (adult) (pediatric): Secondary | ICD-10-CM | POA: Insufficient documentation

## 2017-06-18 DIAGNOSIS — I252 Old myocardial infarction: Secondary | ICD-10-CM | POA: Insufficient documentation

## 2017-06-18 DIAGNOSIS — Z95810 Presence of automatic (implantable) cardiac defibrillator: Secondary | ICD-10-CM | POA: Insufficient documentation

## 2017-06-18 DIAGNOSIS — I10 Essential (primary) hypertension: Secondary | ICD-10-CM | POA: Insufficient documentation

## 2017-06-18 DIAGNOSIS — H3321 Serous retinal detachment, right eye: Secondary | ICD-10-CM | POA: Insufficient documentation

## 2017-06-18 DIAGNOSIS — Z87891 Personal history of nicotine dependence: Secondary | ICD-10-CM | POA: Diagnosis not present

## 2017-06-18 DIAGNOSIS — H33011 Retinal detachment with single break, right eye: Secondary | ICD-10-CM | POA: Diagnosis not present

## 2017-06-18 HISTORY — PX: GAS/FLUID EXCHANGE: SHX5334

## 2017-06-18 HISTORY — PX: PARS PLANA VITRECTOMY: SHX2166

## 2017-06-18 HISTORY — PX: PHOTOCOAGULATION WITH LASER: SHX6027

## 2017-06-18 LAB — BASIC METABOLIC PANEL
Anion gap: 9 (ref 5–15)
BUN: 17 mg/dL (ref 6–20)
CALCIUM: 9.1 mg/dL (ref 8.9–10.3)
CO2: 27 mmol/L (ref 22–32)
Chloride: 97 mmol/L — ABNORMAL LOW (ref 101–111)
Creatinine, Ser: 1.17 mg/dL (ref 0.61–1.24)
GFR, EST NON AFRICAN AMERICAN: 55 mL/min — AB (ref >60)
Glucose, Bld: 101 mg/dL — ABNORMAL HIGH (ref 65–99)
POTASSIUM: 3.8 mmol/L (ref 3.5–5.1)
SODIUM: 133 mmol/L — AB (ref 135–145)

## 2017-06-18 LAB — CBC
HCT: 38.2 % — ABNORMAL LOW (ref 39.0–52.0)
Hemoglobin: 12.1 g/dL — ABNORMAL LOW (ref 13.0–17.0)
MCH: 27.9 pg (ref 26.0–34.0)
MCHC: 31.7 g/dL (ref 30.0–36.0)
MCV: 88.2 fL (ref 78.0–100.0)
PLATELETS: 266 10*3/uL (ref 150–400)
RBC: 4.33 MIL/uL (ref 4.22–5.81)
RDW: 14.8 % (ref 11.5–15.5)
WBC: 8.7 10*3/uL (ref 4.0–10.5)

## 2017-06-18 SURGERY — PARS PLANA VITRECTOMY WITH 25 GAUGE
Anesthesia: Monitor Anesthesia Care | Site: Eye | Laterality: Right

## 2017-06-18 MED ORDER — HYALURONIDASE HUMAN 150 UNIT/ML IJ SOLN
INTRAMUSCULAR | Status: AC
  Filled 2017-06-18: qty 1

## 2017-06-18 MED ORDER — DEXAMETHASONE SODIUM PHOSPHATE 10 MG/ML IJ SOLN
INTRAMUSCULAR | Status: AC
  Filled 2017-06-18: qty 1

## 2017-06-18 MED ORDER — CEFAZOLIN SUBCONJUNCTIVAL INJECTION 100 MG/0.5 ML
100.0000 mg | INJECTION | SUBCONJUNCTIVAL | Status: DC
Start: 2017-06-18 — End: 2017-06-18
  Filled 2017-06-18: qty 5

## 2017-06-18 MED ORDER — EPINEPHRINE PF 1 MG/ML IJ SOLN
INTRAOCULAR | Status: DC | PRN
  Administered 2017-06-18: 15:00:00

## 2017-06-18 MED ORDER — ATROPINE SULFATE 1 % OP SOLN
1.0000 [drp] | OPHTHALMIC | Status: AC
  Administered 2017-06-18 (×3): 1 [drp] via OPHTHALMIC
  Filled 2017-06-18: qty 5

## 2017-06-18 MED ORDER — ACETAZOLAMIDE SODIUM 500 MG IJ SOLR
INTRAMUSCULAR | Status: AC
  Filled 2017-06-18: qty 500

## 2017-06-18 MED ORDER — POLYMYXIN B SULFATE 500000 UNITS IJ SOLR
INTRAMUSCULAR | Status: AC
  Filled 2017-06-18: qty 500000

## 2017-06-18 MED ORDER — SODIUM CHLORIDE 0.9 % IJ SOLN
INTRAMUSCULAR | Status: AC
  Filled 2017-06-18: qty 10

## 2017-06-18 MED ORDER — LIDOCAINE 2% (20 MG/ML) 5 ML SYRINGE
INTRAMUSCULAR | Status: DC | PRN
  Administered 2017-06-18: 70 mg via INTRAVENOUS

## 2017-06-18 MED ORDER — ACETAMINOPHEN 160 MG/5ML PO SOLN: 325.0000 mg | ORAL | Status: DC | PRN

## 2017-06-18 MED ORDER — ATROPINE SULFATE 1 % OP SOLN
OPHTHALMIC | Status: DC | PRN
  Administered 2017-06-18: 1 [drp] via OPHTHALMIC

## 2017-06-18 MED ORDER — FENTANYL CITRATE (PF) 100 MCG/2ML IJ SOLN: 25.0000 ug | INTRAMUSCULAR | Status: DC | PRN

## 2017-06-18 MED ORDER — HYPROMELLOSE (GONIOSCOPIC) 2.5 % OP SOLN
OPHTHALMIC | Status: AC
  Filled 2017-06-18: qty 15

## 2017-06-18 MED ORDER — BSS PLUS IO SOLN
INTRAOCULAR | Status: AC
  Filled 2017-06-18: qty 500

## 2017-06-18 MED ORDER — BACITRACIN-POLYMYXIN B 500-10000 UNIT/GM OP OINT
TOPICAL_OINTMENT | OPHTHALMIC | Status: AC
  Filled 2017-06-18: qty 3.5

## 2017-06-18 MED ORDER — FENTANYL CITRATE (PF) 250 MCG/5ML IJ SOLN
INTRAMUSCULAR | Status: AC
  Filled 2017-06-18: qty 5

## 2017-06-18 MED ORDER — BUPIVACAINE-EPINEPHRINE (PF) 0.25% -1:200000 IJ SOLN
INTRAMUSCULAR | Status: AC
  Filled 2017-06-18: qty 30

## 2017-06-18 MED ORDER — TOBRAMYCIN-DEXAMETHASONE 0.3-0.1 % OP OINT
TOPICAL_OINTMENT | OPHTHALMIC | Status: DC | PRN
  Administered 2017-06-18: 1 via OPHTHALMIC

## 2017-06-18 MED ORDER — SODIUM HYALURONATE 10 MG/ML IO SOLN
INTRAOCULAR | Status: AC
  Filled 2017-06-18: qty 0.85

## 2017-06-18 MED ORDER — STERILE WATER FOR INJECTION IJ SOLN
INTRAMUSCULAR | Status: AC
  Filled 2017-06-18: qty 20

## 2017-06-18 MED ORDER — ATROPINE SULFATE 1 % OP SOLN
OPHTHALMIC | Status: AC
  Filled 2017-06-18: qty 5

## 2017-06-18 MED ORDER — SODIUM CHLORIDE 0.9 % IV SOLN
INTRAVENOUS | Status: DC
  Administered 2017-06-18 (×2): via INTRAVENOUS

## 2017-06-18 MED ORDER — ONDANSETRON HCL 4 MG/2ML IJ SOLN
INTRAMUSCULAR | Status: DC | PRN
  Administered 2017-06-18: 4 mg via INTRAVENOUS

## 2017-06-18 MED ORDER — TRIAMCINOLONE ACETONIDE 40 MG/ML IJ SUSP
INTRAMUSCULAR | Status: AC
  Filled 2017-06-18: qty 5

## 2017-06-18 MED ORDER — 0.9 % SODIUM CHLORIDE (POUR BTL) OPTIME
TOPICAL | Status: DC | PRN
  Administered 2017-06-18: 200 mL

## 2017-06-18 MED ORDER — PROPOFOL 10 MG/ML IV BOLUS
INTRAVENOUS | Status: DC | PRN
  Administered 2017-06-18: 30 mg via INTRAVENOUS

## 2017-06-18 MED ORDER — CEFTAZIDIME 1 G IJ SOLR
INTRAMUSCULAR | Status: AC
  Filled 2017-06-18: qty 1

## 2017-06-18 MED ORDER — FENTANYL CITRATE (PF) 100 MCG/2ML IJ SOLN
INTRAMUSCULAR | Status: DC | PRN
  Administered 2017-06-18: 50 ug via INTRAVENOUS

## 2017-06-18 MED ORDER — LIDOCAINE HCL 2 % IJ SOLN
INTRAMUSCULAR | Status: AC
  Filled 2017-06-18: qty 20

## 2017-06-18 MED ORDER — LIDOCAINE HCL 2 % IJ SOLN
INTRAMUSCULAR | Status: DC | PRN
  Administered 2017-06-18: 7 mL via RETROBULBAR

## 2017-06-18 MED ORDER — BUPIVACAINE HCL (PF) 0.75 % IJ SOLN
INTRAMUSCULAR | Status: AC
  Filled 2017-06-18: qty 10

## 2017-06-18 MED ORDER — STERILE WATER FOR IRRIGATION IR SOLN
Status: DC | PRN
  Administered 2017-06-18: 200 mL

## 2017-06-18 MED ORDER — PROPOFOL 10 MG/ML IV BOLUS
INTRAVENOUS | Status: AC
  Filled 2017-06-18: qty 20

## 2017-06-18 MED ORDER — TOBRAMYCIN-DEXAMETHASONE 0.3-0.1 % OP OINT
TOPICAL_OINTMENT | OPHTHALMIC | Status: AC
  Filled 2017-06-18: qty 3.5

## 2017-06-18 MED ORDER — MEPERIDINE HCL 25 MG/ML IJ SOLN: 6.2500 mg | INTRAMUSCULAR | Status: DC | PRN

## 2017-06-18 MED ORDER — DEXAMETHASONE SODIUM PHOSPHATE 10 MG/ML IJ SOLN
INTRAMUSCULAR | Status: DC | PRN
  Administered 2017-06-18: 10 mg

## 2017-06-18 MED ORDER — ACETAMINOPHEN 325 MG PO TABS: 325.0000 mg | ORAL_TABLET | ORAL | Status: DC | PRN

## 2017-06-18 MED ORDER — ONDANSETRON HCL 4 MG/2ML IJ SOLN: 4.0000 mg | Freq: Once | INTRAMUSCULAR | Status: DC | PRN

## 2017-06-18 MED ORDER — HYPROMELLOSE (GONIOSCOPIC) 2.5 % OP SOLN
OPHTHALMIC | Status: DC | PRN
  Administered 2017-06-18: 2 [drp] via OPHTHALMIC

## 2017-06-18 MED ORDER — CEFAZOLIN SODIUM 1 G IJ SOLR
INTRAMUSCULAR | Status: DC | PRN
  Administered 2017-06-18: 1 g via INTRAMUSCULAR

## 2017-06-18 MED ORDER — BSS IO SOLN
INTRAOCULAR | Status: AC
  Filled 2017-06-18: qty 15

## 2017-06-18 MED ORDER — PHENYLEPHRINE HCL 2.5 % OP SOLN
1.0000 [drp] | OPHTHALMIC | Status: AC
  Administered 2017-06-18 (×3): 1 [drp] via OPHTHALMIC
  Filled 2017-06-18: qty 2

## 2017-06-18 MED ORDER — EPINEPHRINE PF 1 MG/ML IJ SOLN
INTRAMUSCULAR | Status: AC
  Filled 2017-06-18: qty 1

## 2017-06-18 SURGICAL SUPPLY — 66 items
BLADE MVR KNIFE 20G (BLADE) IMPLANT
CANNULA ANT CHAM MAIN (OPHTHALMIC RELATED) IMPLANT
CANNULA ANTERIOR CHAMBER 27GA (MISCELLANEOUS) IMPLANT
CANNULA DUAL BORE 23G (CANNULA) IMPLANT
CANNULA DUALBORE 25G (CANNULA) IMPLANT
CANNULA VLV SOFT TIP 25G (OPHTHALMIC) ×1 IMPLANT
CANNULA VLV SOFT TIP 25GA (OPHTHALMIC) ×3 IMPLANT
CAUTERY EYE LOW TEMP 1300F FIN (OPHTHALMIC RELATED) IMPLANT
CLOSURE STERI-STRIP 1/2X4 (GAUZE/BANDAGES/DRESSINGS) ×1
CLSR STERI-STRIP ANTIMIC 1/2X4 (GAUZE/BANDAGES/DRESSINGS) ×2 IMPLANT
CORD BIPOLAR FORCEPS 12FT (ELECTRODE) ×3 IMPLANT
COVER MAYO STAND STRL (DRAPES) IMPLANT
DRAPE HALF SHEET 40X57 (DRAPES) ×1 IMPLANT
DRAPE INCISE 51X51 W/FILM STRL (DRAPES) IMPLANT
DRAPE RETRACTOR (MISCELLANEOUS) ×3 IMPLANT
ERASER HMR WETFIELD 23G BP (MISCELLANEOUS) IMPLANT
FILTER BLUE MILLIPORE (MISCELLANEOUS) IMPLANT
FORCEPS ECKARDT ILM 25G SERR (OPHTHALMIC RELATED) IMPLANT
FORCEPS GRIESHABER ILM 25G A (INSTRUMENTS) IMPLANT
GAS AUTO FILL CONSTEL (OPHTHALMIC) ×3
GAS AUTO FILL CONSTELLATION (OPHTHALMIC) IMPLANT
GAS OPHTHALMIC (MISCELLANEOUS) ×2 IMPLANT
GLOVE BIO SURGEON STRL SZ7.5 (GLOVE) ×3 IMPLANT
GLOVE SURG SS PI 6.0 STRL IVOR (GLOVE) ×4 IMPLANT
GOWN STRL REUS W/ TWL LRG LVL3 (GOWN DISPOSABLE) ×2 IMPLANT
GOWN STRL REUS W/TWL LRG LVL3 (GOWN DISPOSABLE) ×9
HANDLE PNEUMATIC FOR CONSTEL (OPHTHALMIC) IMPLANT
KIT BASIN OR (CUSTOM PROCEDURE TRAY) ×3 IMPLANT
KIT ROOM TURNOVER OR (KITS) IMPLANT
LENS BIOM SUPER VIEW SET DISP (OPHTHALMIC RELATED) ×3 IMPLANT
MICROPICK 25G (MISCELLANEOUS)
NDL 18GX1X1/2 (RX/OR ONLY) (NEEDLE) ×1 IMPLANT
NDL 25GX 5/8IN NON SAFETY (NEEDLE) ×1 IMPLANT
NDL FILTER BLUNT 18X1 1/2 (NEEDLE) IMPLANT
NDL HYPO 25GX1X1/2 BEV (NEEDLE) IMPLANT
NDL HYPO 30X.5 LL (NEEDLE) ×1 IMPLANT
NDL RETROBULBAR 25GX1.5 (NEEDLE) ×1 IMPLANT
NEEDLE 18GX1X1/2 (RX/OR ONLY) (NEEDLE) ×6 IMPLANT
NEEDLE 25GX 5/8IN NON SAFETY (NEEDLE) ×3 IMPLANT
NEEDLE FILTER BLUNT 18X 1/2SAF (NEEDLE) ×2
NEEDLE FILTER BLUNT 18X1 1/2 (NEEDLE) ×1 IMPLANT
NEEDLE HYPO 25GX1X1/2 BEV (NEEDLE) IMPLANT
NEEDLE HYPO 30X.5 LL (NEEDLE) IMPLANT
NEEDLE RETROBULBAR 25GX1.5 (NEEDLE) ×6 IMPLANT
NS IRRIG 1000ML POUR BTL (IV SOLUTION) ×3 IMPLANT
PAD ARMBOARD 7.5X6 YLW CONV (MISCELLANEOUS) ×6 IMPLANT
PAK PIK VITRECTOMY CVS 25GA (OPHTHALMIC) ×3 IMPLANT
PAK VITRECTOMY PIK 25 GA (OPHTHALMIC RELATED) ×3 IMPLANT
PENCIL BIPOLAR 25GA STR DISP (OPHTHALMIC RELATED) ×2 IMPLANT
PICK MICROPICK 25G (MISCELLANEOUS) IMPLANT
PROBE LASER ILLUM FLEX CVD 25G (OPHTHALMIC) ×4 IMPLANT
ROLLS DENTAL (MISCELLANEOUS) IMPLANT
SCRAPER DIAMOND 25GA (OPHTHALMIC RELATED) IMPLANT
STOCKINETTE IMPERVIOUS 9X36 MD (GAUZE/BANDAGES/DRESSINGS) ×6 IMPLANT
STOPCOCK 4 WAY LG BORE MALE ST (IV SETS) IMPLANT
SUT ETHILON 8 0 TG100 8 (SUTURE) IMPLANT
SUT VICRYL 7 0 TG140 8 (SUTURE) IMPLANT
SUT VICRYL 8 0 TG140 8 (SUTURE) IMPLANT
SUT VICRYL ABS 6-0 S29 18IN (SUTURE) IMPLANT
SYR 10ML LL (SYRINGE) IMPLANT
SYR 20CC LL (SYRINGE) ×1 IMPLANT
SYR 5ML LL (SYRINGE) ×1 IMPLANT
SYR TB 1ML LUER SLIP (SYRINGE) IMPLANT
TUBE CONNECTING 12'X1/4 (SUCTIONS)
TUBE CONNECTING 12X1/4 (SUCTIONS) IMPLANT
WATER STERILE IRR 1000ML POUR (IV SOLUTION) ×3 IMPLANT

## 2017-06-18 NOTE — Progress Notes (Signed)
Dr Baird Cancer explained discharge instructions to patient and wife prior to surgery. Dr Baird Cancer at bedside post op to reinforce discharge instructions. Patient and wife able to recant discharge info at time of discharge. No further questions at this time.

## 2017-06-18 NOTE — Transfer of Care (Signed)
Immediate Anesthesia Transfer of Care Note  Patient: [name] [name]  Procedure(s) Performed: PARS PLANA VITRECTOMY WITH 25 GAUGE (Right Eye) GAS/FLUID EXCHANGE (C3F8) (Right Eye) PHOTOCOAGULATION WITH LASER- ENDO LASER (Right Eye)  Patient Location: PACU  Anesthesia Type:MAC  Level of Consciousness: awake, alert , oriented and patient cooperative  Airway & Oxygen Therapy: Patient Spontanous Breathing  Post-op Assessment: Report given to RN, Post -op Vital signs reviewed and stable and Patient moving all extremities X 4  Post vital signs: Reviewed and stable Pt placed left side down per Dr Baird Cancer request.  Last Vitals:  Vitals:   06/18/17 1238  BP: (!) 177/78  Pulse: 70  Resp: 19  Temp: 37.1 C  SpO2: 100%    Last Pain:  Vitals:   06/18/17 1238  TempSrc: Oral         Complications: No apparent anesthesia complications

## 2017-06-18 NOTE — H&P (Signed)
[name] [name] is an 83 y.o. male.   Chief Complaint: vision loss OD  HPI: diagnosed with retinal detachment OD, here for surgery.   Past Medical History:  Diagnosis Date  . AICD (automatic cardioverter/defibrillator) present   . Arrhythmia   . Arthritis    "hands, legs" (03/26/2016)  . 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 Chronic systolic CHF (congestive heart failure) ([place])    a. Echo 9/14: Mild LVH, mild focal basal septal hypertrophy, EF 35-40%, anteroseptal HK, normal diastolic function, mild MR, mild LAE, PASP 34 mmHg  . Chronotropic incompetence    treated w/pacemaker, ICD 7.2008. This was placed after CPX done post 2008 cath. CPX showed only chronotropic incompetence  . History of blood transfusion 1969   "after getting MSO4; it destroys my corpuscles" (03/26/2016)  . History of colon polyps   . Hyperlipidemia   . Hypertension   . Migraine    "just before my bypass" (03/26/2016)  . Mitral regurgitation    mild  . Myocardial infarction ([place]) "several"  . Numbness and tingling of left arm and leg    improved after tx w/prednisone  . OSA (obstructive sleep apnea) 09/01/2016   Severe OSA with AHI 38/hr now on BiPAP at 21/17cm H2O  . Syncopal episodes    relative hypotension    Past Surgical History:  Procedure Laterality Date  . BACK SURGERY    . CARDIAC CATHETERIZATION  1980s-1990s X 4  . CARDIAC CATHETERIZATION N/A 03/26/2016   Procedure: Left Heart Cath and Cors/Grafts Angiography;  Surgeon: Burnell Blanks, MD;  Location: [facility];  Service: Cardiovascular;  Laterality: N/A;  . CARDIAC CATHETERIZATION N/A 03/26/2016   Procedure: Coronary Stent Intervention;  Surgeon:  Burnell Blanks, MD;  Location: [facility];  Service: Cardiovascular;  Laterality: N/A;  . CARDIAC DEFIBRILLATOR PLACEMENT  2008  . CATARACT EXTRACTION W/ INTRAOCULAR LENS  IMPLANT, BILATERAL Bilateral 08/2013 - 09/2013   right - left   . CORONARY ANGIOPLASTY WITH STENT PLACEMENT  ~ 1992; 03/26/2016   "1 + 2"  . CORONARY ARTERY BYPASS GRAFT  1992   CABG "X4"  . CYSTOSCOPY  1980s  . IMPLANTABLE CARDIOVERTER DEFIBRILLATOR (ICD) GENERATOR CHANGE N/A 06/21/2014   Procedure: ICD GENERATOR CHANGE;  Surgeon: Deboraha Sprang, MD;  Location: [facility];  Service: Cardiovascular;  Laterality: N/A;  . POSTERIOR LUMBAR FUSION  ~ 1989   "fused 2 discs"  . TIBIA FRACTURE SURGERY Left 1943   "put artificial bone in there"    Family History  Problem Relation Age of Onset  . Hypertension Mother   . Hypertension Father   . Hypertension Other   . Colon cancer Other        1st degree relative <50   Social History:  reports that he has quit smoking. His smoking use included Cigars. He quit after 29.00 years of use. He has never used smokeless tobacco. He reports that he drinks alcohol. He reports that he does not use drugs.  Allergies:  Allergies  Allergen Reactions  . Morphine     "increased white corpuscles"  . Ramipril Other (See Comments)    REACTION:  cough if dose is over 2.63m    Medications Prior to Admission  Medication Sig Dispense Refill  . aspirin 81 MG tablet Take 1 tablet (81 mg total) by mouth daily. 30 tablet 11  . atorvastatin (LIPITOR) 40 MG tablet TAKE 1 TABLET DAILY 90 tablet 3  . carvedilol (COREG) 25 MG tablet Take 0.5 tablets (12.5 mg total) by mouth 2 (two) times daily. 90 tablet 3  . Cholecalciferol 1000 UNITS tablet Take 1,000 Units by mouth daily.      . ciprofloxacin (CILOXAN) 0.3 % ophthalmic solution Place 1 drop into the right eye 4 (four) times daily.    . clopidogrel (PLAVIX) 75 MG tablet Take 1 tablet (75 mg total) by mouth daily. 90 tablet 3  .  DUREZOL 0.05 % EMUL Place 1 drop into the right eye 2 (two) times daily. START AFTER SURGERY    . hydrochlorothiazide (MICROZIDE) 12.5 MG capsule Take 1 capsule (12.5 mg total) by mouth daily. 90 capsule 3  . isosorbide mononitrate (IMDUR) 30 MG 24 hr tablet TAKE ONE-HALF (1/2) TABLET DAILY 45 tablet 3  . loratadine (CLARITIN) 10 MG tablet Take 1 tablet (10 mg total) by mouth daily. 10 tablet 0  . losartan (COZAAR) 100 MG tablet Take 0.5 tablets (50 mg total) by mouth daily. 45 tablet 2  . Multiple Vitamins-Minerals (PRESERVISION AREDS 2) CAPS Take 1 capsule by mouth daily.     . nitroGLYCERIN (NITROLINGUAL) 0.4 MG/SPRAY spray Place 1 spray under the tongue every 5 (five) minutes x 3 doses as needed for chest pain.       Results for orders placed or performed during the hospital encounter of 06/18/17 (from the past 48 hour(s))  CBC     Status: Abnormal   Collection Time: 06/18/17 12:37 PM  Result Value Ref Range   WBC 8.7 4.0 - 10.5 K/uL   RBC 4.33 4.22 - 5.81 MIL/uL   Hemoglobin 12.1 (L) 13.0 - 17.0 g/dL   HCT 38.2 (L) 39.0 - 52.0 %   MCV 88.2 78.0 - 100.0 fL   MCH 27.9 26.0 - 34.0 pg   MCHC 31.7 30.0 - 36.0 g/dL   RDW 14.8 11.5 - 15.5 %   Platelets 266 150 - 400 K/uL  Basic metabolic panel     Status: Abnormal   Collection Time: 06/18/17 12:37 PM  Result Value Ref Range   Sodium 133 (L) 135 - 145 mmol/L   Potassium 3.8 3.5 - 5.1 mmol/L   Chloride 97 (L) 101 - 111 mmol/L   CO2 27 22 - 32 mmol/L   Glucose, Bld 101 (H) 65 - 99 mg/dL   BUN 17 6 - 20 mg/dL   Creatinine, Ser 1.17 0.61 - 1.24 mg/dL   Calcium 9.1 8.9 - 10.3 mg/dL   GFR calc non Af Amer 55 (L) >60 mL/min   GFR calc Af Amer >60 >60 mL/min    Comment: (NOTE) The eGFR has been calculated using the CKD EPI equation. This calculation has not been validated in all clinical situations. eGFR's persistently <60 mL/min signify possible Chronic Kidney Disease.    Anion gap 9 5 - 15   No results found.  ROS  Blood  pressure (!) 177/78, pulse 70, temperature 98.7 F (37.1 C), temperature source Oral, resp. rate 19, height _0  (1.676 m), weight 74.4 kg (164 lb), SpO2 100 %. Physical Exam   Assessment/Plan 1. Mac on RD OD: PPV/EC/EL/GFX OD.  SCorliss Parish MD 06/18/2017, 3:48 PM

## 2017-06-18 NOTE — Anesthesia Preprocedure Evaluation (Addendum)
Anesthesia Evaluation  Patient identified by MRN, date of birth, ID band Patient awake    Reviewed: Allergy & Precautions, NPO status , Patient's Chart, lab work & pertinent test results  Airway Mallampati: I  TM Distance: >3 FB Neck ROM: Full    Dental  (+) Lower Dentures, Upper Dentures   Pulmonary former smoker,    Pulmonary exam normal breath sounds clear to auscultation       Cardiovascular hypertension, + CAD, + Past MI, + Cardiac Stents and +CHF   Rhythm:Regular Rate:Normal + Systolic murmurs    Neuro/Psych    GI/Hepatic   Endo/Other    Renal/GU      Musculoskeletal   Abdominal Normal abdominal exam  (+)   Peds  Hematology   Anesthesia Other Findings [place]  Order# [id number]  Ordering physician: [name] Sprang, MD Study date: 05/07/2017 Conclusion  [name] [name]  Implantable device - remote  Order# [id number]  Ordering physician: [name] Sprang, MD Study date: 12/26/2014 Conclusion   [name] [name] Result Report   [facility] Statistic RA Percent Paced:  92 %    Brady Statistic RV Percent Paced:  6.1 %   Eval Rhythm:  ApVs     HighPow Impedance:  71 ohm   Implantable Pulse Generator Manufacturer:  [employer]     Implantable Pulse Generator Model:  [id number]    Implantable Pulse Generator Serial Number:  [id number]     Lead Channel Impedance Value:  360 ohm   Lead Channel Impedance Value:  390 ohm    Lead Channel Pacing Threshold Amplitude:  0.5 V   Lead Channel Pacing Threshold Pulse Width:  0.5 ms    Lead Channel Sensing Intrinsic Amplitude:  2.4 mV   Lead Channel Setting Pacing Amplitude:  1.5 V    Lead Channel Setting Pacing Amplitude:  2.5 V   Lead Channel Setting Pacing Pulse Width:  0.5 ms    Lead Channel Setting Sensing Sensitivity:  0.5 mV   Miscellaneous Comment ICD remote received. Sensing,  impedances, auto capture thresholds consistent with previous device measurements. Histograms appropriate for patient and level of activity. 5 mode switch episodes recorded--longest was 12 seconds. No ventricular arrhythmias  recorded. All other diagnostic data reviewed and is appropriate and stable for patient. Real time EGM demonstrates appropriate sensing and capture. CorVue increase around beginning of February x 10 days but stable at this time. Estimated longevity 6.2 to  6.8 years. Merlin 03-28-15 and ROV in October with SK.  [name] [name]  ECHO COMPLETE WO IMAGING ENHANCING AGENT  Order# [id number]  Reading physician: Thayer Headings, MD Ordering physician: Thayer Headings, MD Study date: 03/30/17 Result Notes   Notes recorded by Emmaline Life, RN on 03/31/2017 at 4:14 PM EDT Results reviewed with patient who verbalized understanding ------  Notes recorded by Nahser, Wonda Cheng, MD on 03/31/2017 at 10:40 AM EDT LV EF is about the same as previous echo. Was recorded as slightly lower but not significantly different . Continue current meds.    Study Result   Result status: Final result                          [name] [name] [facility]                        [address]  [place], [place] [address]                            [phone]  ------------------------------------------------------------------- Transthoracic Echocardiography  Patient:    [name] [name], [name] [name] MR #:       [id number] Study Date: 03/30/2017 Gender:     M Age:        83 Height:     167.6 cm Weight:     73.9 kg BSA:        1.87 m^2 Pt. Status: Room:   REFERRING    Mertie Moores, M.D.  SONOGRAPHER  Bourg, Will  REFERRING    Plotnikov, [facility], Outpatient  ATTENDING    Nahser, Peggyann Juba     Nahser, Jr  cc:  ------------------------------------------------------------------- LV EF: 30% -    35%  ------------------------------------------------------------------- Indications:      (R06.02).  ------------------------------------------------------------------- History:   PMH:  Ischemic cardiomyopathy. Acquired from the patient and from the patient&'s chart.  Dyspnea.  Coronary artery disease. Congestive heart failure.  Risk factors:  Former tobacco use. Hypertension. Dyslipidemia.  ------------------------------------------------------------------- Study Conclusions  - Left ventricle: The cavity size was normal. Wall thickness was   increased in a pattern of moderate to severe LVH. There was focal   basal hypertrophy. Systolic function was moderately to severely   reduced. The estimated ejection fraction was in the range of 30%   to 35%. Features are consistent with a pseudonormal left   ventricular filling pattern, with concomitant abnormal relaxation   and increased filling pressure (grade 2 diastolic dysfunction). - Aortic valve: Moderately calcified annulus. Moderately thickened,   moderately calcified leaflets. There was trivial regurgitation. - Mitral valve: There was mild to moderate regurgitation. - Right ventricle: Systolic function was mildly to moderately   reduced. - Pulmonary arteries: Systolic pressure was mildly increased.  ------------------------------------------------------------------- Labs, prior tests, procedures, and surgery:   [place]      Normal remote reviewed. 16 AMS episodes (<1%)    Reproductive/Obstetrics                            Anesthesia Physical Anesthesia Plan  ASA: III  Anesthesia Plan: MAC   Post-op Pain Management:    Induction: Intravenous  PONV Risk Score and Plan: 1 and Ondansetron, Dexamethasone and Treatment may vary due to age or medical condition  Airway Management Planned: Natural Airway, Nasal Cannula and Simple Face Mask  Additional Equipment:   Intra-op Plan:    Post-operative Plan:   Informed Consent: I have reviewed the patients History and Physical, chart, labs and discussed the procedure including the risks, benefits and alternatives for the proposed anesthesia with the patient or authorized representative who has indicated his/her understanding and acceptance.     Plan Discussed with: CRNA and Surgeon  Anesthesia Plan Comments:         Anesthesia Quick Evaluation

## 2017-06-18 NOTE — Brief Op Note (Signed)
06/18/2017  5:55 PM  PATIENT:  [name]  83 y.o. male  PRE-OPERATIVE DIAGNOSIS:  Retinal detachment right eye  POST-OPERATIVE DIAGNOSIS:  Retinal detachment right eye  PROCEDURE:  Procedure(s): PARS PLANA VITRECTOMY WITH 25 GAUGE (Right) GAS/FLUID EXCHANGE (C3F8) (Right) PHOTOCOAGULATION WITH LASER- ENDO LASER (Right)  SURGEON:  Surgeon(s) and Role:    Sherlynn Stalls, MD - Primary  PHYSICIAN ASSISTANT:   ASSISTANTS: none   ANESTHESIA:   MAC  EBL:  Total I/O In: 600 [I.V.:600] Out: -   BLOOD ADMINISTERED:none  DRAINS: none   LOCAL MEDICATIONS USED:  BUPIVICAINE   SPECIMEN:  No Specimen  DISPOSITION OF SPECIMEN:  N/A  COUNTS:  YES  TOURNIQUET:  * No tourniquets in log *  DICTATION: .Other Dictation: Dictation Number [id number]  PLAN OF CARE: Discharge to home after PACU  PATIENT DISPOSITION:  PACU - hemodynamically stable.   Delay start of Pharmacological VTE agent (>24hrs) due to surgical blood loss or risk of bleeding: not applicable

## 2017-06-18 NOTE — Anesthesia Postprocedure Evaluation (Signed)
Anesthesia Post Note  Patient: [name] [name]  Procedure(s) Performed: PARS PLANA VITRECTOMY WITH 25 GAUGE (Right Eye) GAS/FLUID EXCHANGE (C3F8) (Right Eye) PHOTOCOAGULATION WITH LASER- ENDO LASER (Right Eye)     Patient location during evaluation: PACU Anesthesia Type: MAC Level of consciousness: awake, awake and alert and oriented Pain management: pain level controlled Vital Signs Assessment: post-procedure vital signs reviewed and stable Respiratory status: spontaneous breathing, nonlabored ventilation and respiratory function stable Cardiovascular status: blood pressure returned to baseline Anesthetic complications: no    Last Vitals:  Vitals:   06/18/17 1825 06/18/17 1840  BP: (!) 143/76 (!) 151/72  Pulse: 73 (!) 59  Resp: (!) 21 (!) 21  Temp:  36.7 C  SpO2: 96% 96%    Last Pain:  Vitals:   06/18/17 1238  TempSrc: Oral                 [name],[name]

## 2017-06-19 ENCOUNTER — Encounter (HOSPITAL_COMMUNITY): Payer: Self-pay | Admitting: Ophthalmology

## 2017-06-19 DIAGNOSIS — H33011 Retinal detachment with single break, right eye: Secondary | ICD-10-CM | POA: Diagnosis not present

## 2017-06-19 NOTE — Op Note (Signed)
NAME:  [name] [name], [name] [name]                       ACCOUNT NO.:  MEDICAL RECORD NO.:  [id number]  LOCATION:                                 FACILITY:  PHYSICIAN:  Corliss Parish, MD         DATE OF BIRTH:  DATE OF PROCEDURE:  06/18/2017 DATE OF DISCHARGE:                              OPERATIVE REPORT   PREOPERATIVE DIAGNOSIS:  Retinal detachment, right eye.  POSTOPERATIVE DIAGNOSIS:  Retinal detachment, right eye.  OPERATION:  Pars plana vitrectomy with endo cautery, endolaser, air- fluid exchange, and gas-fluid exchange using 14% C3F8 gas, right eye.  SURGEON:  Corliss Parish, MD  ANESTHESIA:  MAC.  COMPLICATIONS:  None.  FINDINGS:  There was a macula-on retinal detachment and a temporal retinal break.  ESTIMATED BLOOD LOSS:  Less than 5 mL.  DESCRIPTION OF PROCEDURE:  The patient was identified in the preoperative holding area.  He was then taken to the operating room where he was sedated by the Anesthesia team.  At that point in time, the right eye was anesthetized using a retrobulbar block consisting of a 1:1 mixture of 0.75% bupivacaine and 1% lidocaine and 150 units of Hylenex. After the retrobulbar block was administered, the needle was removed. After excellent akinesia and anesthesia was obtained in the right eye, the right eye was prepped and draped in usual sterile fashion for ocular surgery.  Next, a Lieberman speculum was placed between the right upper and lower eyelids for exposure.  A 25-gauge trocar was used to introduce transconjunctival cannulas in the inferotemporal, superotemporal, supranasal quadrants.  The trocar was removed leaving the cannula in place.  Once the cannulas were in place, an infusion cannula was placed in the inferior temporal cannula location.  The infusion cannula was confirmed to be in the correct position prior to turning on the infusion.  Once the infusion was on, the eye underwent a core vitrectomy with the vitreous cutter and light  pipe.  Vitreous was trimmed out to the periphery.  The eye was then inspected with scleral depression.  A retinal break was located in the 1 o'clock location over non-detached retina.  This was encircled using endolaser photocoagulation.  After this was completed, the only other retinal breaks were the before- noticed retinal breaks causing the temporal retinal detachment at approximately 9:30.  These retinal breaks were marked using endo cautery.  The endo cautery probe was used to mark the location of the retinal break and then removed.  Once the retinal breaks were marked, a soft-tipped extrusion cannula was used to perform an air-fluid exchange. The soft-tipped extrusion cannula was used to first remove subretinal fluid through the existing retinal break and then removed the remaining vitreal fluid.  Once this was complete, there was a small residual amount of posterior subretinal fluid in the subretinal space. Therefore, an endo cautery probe was used to create a small retinotomy to access the subretinal fluid.  Once this was completed, the retinotomy was treated with confluent laser to close the retinotomy.  Laser was also applied to the retinal break.  At this point in time, the retina was completely  flat and the eye was filled with air.  Next, the eye was flushed with a 14% concentration of C3F8 gas.  Once this was completed, the cannulas were removed from the sclerotomies.  The sclerotomies were inspected and found to be watertight.  Therefore, the eye was treated with subconjunctival injections of 15 mg of Ancef and 1 mg of dexamethasone.  The eye was then treated topically with 1 drop of 1% atropine and TobraDex ointment.  The speculum was removed, and the eyelids were cleaned and closed.  Eye was then patched and shielded. The patient was taken to the recovery room in excellent condition having tolerated the procedure very well.     Corliss Parish, MD     JBS/MEDQ   D:  06/18/2017  T:  06/19/2017  Job:  [id number]

## 2017-06-23 ENCOUNTER — Ambulatory Visit: Payer: Medicare Other | Admitting: Family Medicine

## 2017-06-25 ENCOUNTER — Other Ambulatory Visit: Payer: Self-pay | Admitting: Cardiovascular Disease

## 2017-06-28 NOTE — Progress Notes (Signed)
Cardiology Office Note Date:  06/30/2017  Patient ID:  [name] [name], [name] [name] 1929/03/09, MRN [id number] PCP:  Cassandria Anger, MD  Cardiologist:  Dr. Acie Fredrickson Electrophysiologist: Dr. Caryl Comes OSA: Dr. Radford Pax   Chief Complaint: annual device visit  History of Present Illness: [name] [name] is a 83 y.o. male with history of CAD (CABG > PCI w/DES x2 in July 2017), HLD, HTN, ICM w/ICD, chronic CHF (systolic), OSA w/CPAP.  He comes to be seen for Dr. Caryl Comes, last seen by him Nov 2017, at that time coreg was reduced 2/2 lower BP, question of SOB 2/2 Brilinta.  More recently underwent eye surgery 2/2 retinal detachment 06/18/17.  He is doing quite well.  Denies any kind of cardiac awareness, no CP, palpitations or SOB.  He has [place] acres, cares for 5 horses, a few dogs and cats as well, he and his wife care for their home, he denies any DOE or exertional incapacities.  No dizziness, near syncope or syncope,  Device information: SJM dual chamber ICD, implanted 03/26/07, gen change 06/21/14, Dr. Caryl Comes   Past Medical History:  Diagnosis Date  . AICD (automatic cardioverter/defibrillator) present   . Arrhythmia   . Arthritis    "hands, legs" (03/26/2016)  . 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 Chronic systolic CHF (congestive heart failure) ([place])    a. Echo 9/14: Mild LVH, mild focal basal septal hypertrophy, EF 35-40%, anteroseptal HK, normal diastolic function, mild MR, mild LAE, PASP 34 mmHg  . Chronotropic incompetence    treated w/pacemaker, ICD 7.2008. This was placed after CPX done post 2008 cath. CPX showed only chronotropic incompetence  . History of blood transfusion 1969   "after getting MSO4; it destroys  my corpuscles" (03/26/2016)  . History of colon polyps   . Hyperlipidemia   . Hypertension   . Migraine    "just before my bypass" (03/26/2016)  . Mitral regurgitation    mild  . Myocardial infarction ([place]) "several"  . Numbness and tingling of left arm and leg    improved after tx w/prednisone  . OSA (obstructive sleep apnea) 09/01/2016   Severe OSA with AHI 38/hr now on BiPAP at 21/17cm H2O  . Syncopal episodes    relative hypotension    Past Surgical History:  Procedure Laterality Date  . BACK SURGERY    . CARDIAC CATHETERIZATION  1980s-1990s X 4  . CARDIAC CATHETERIZATION N/A 03/26/2016   Procedure: Left Heart Cath and Cors/Grafts Angiography;  Surgeon: Burnell Blanks, MD;  Location: [facility];  Service: Cardiovascular;  Laterality: N/A;  . CARDIAC CATHETERIZATION N/A 03/26/2016   Procedure: Coronary Stent Intervention;  Surgeon: Burnell Blanks, MD;  Location: [facility];  Service: Cardiovascular;  Laterality: N/A;  . CARDIAC DEFIBRILLATOR PLACEMENT  2008  . CATARACT EXTRACTION W/ INTRAOCULAR LENS  IMPLANT, BILATERAL Bilateral 08/2013 - 09/2013   right - left   . CORONARY ANGIOPLASTY WITH STENT PLACEMENT  ~ 1992; 03/26/2016   "1 + 2"  . CORONARY ARTERY BYPASS GRAFT  1992   CABG "X4"  . CYSTOSCOPY  1980s  . GAS/FLUID EXCHANGE Right 06/18/2017   Procedure: GAS/FLUID EXCHANGE (C3F8);  Surgeon: Sherlynn Stalls, MD;  Location: [facility];  Service: Ophthalmology;  Laterality: Right;  . IMPLANTABLE CARDIOVERTER DEFIBRILLATOR (ICD) GENERATOR CHANGE N/A 06/21/2014   Procedure: ICD GENERATOR CHANGE;  Surgeon: Deboraha Sprang, MD;  [facility]: [facility];  Service: Cardiovascular;  Laterality: N/A;  . PARS PLANA VITRECTOMY Right 06/18/2017   Procedure: PARS PLANA VITRECTOMY WITH 25 GAUGE;  Surgeon: Sherlynn Stalls, MD;  Location: [place];  Service: Ophthalmology;  Laterality: Right;  . PHOTOCOAGULATION WITH LASER Right 06/18/2017   Procedure: PHOTOCOAGULATION WITH LASER-  ENDO LASER;  Surgeon: Sherlynn Stalls, MD;  Location: [place];  Service: Ophthalmology;  Laterality: Right;  . POSTERIOR LUMBAR FUSION  ~ 1989   "fused 2 discs"  . TIBIA FRACTURE SURGERY Left 1943   "put artificial bone in there"    Current Outpatient Prescriptions  Medication Sig Dispense Refill  . aspirin 81 MG tablet Take 1 tablet (81 mg total) by mouth daily. 30 tablet 11  . atorvastatin (LIPITOR) 40 MG tablet TAKE 1 TABLET DAILY 90 tablet 3  . carvedilol (COREG) 25 MG tablet Take 0.5 tablets (12.5 mg total) by mouth 2 (two) times daily. 90 tablet 3  . Cholecalciferol 1000 UNITS tablet Take 1,000 Units by mouth daily.      . ciprofloxacin (CILOXAN) 0.3 % ophthalmic solution Place 1 drop into the right eye 4 (four) times daily.    . clopidogrel (PLAVIX) 75 MG tablet Take 1 tablet (75 mg total) by mouth daily. 90 tablet 3  . DUREZOL 0.05 % EMUL Place 1 drop into the right eye 2 (two) times daily. START AFTER SURGERY    . hydrochlorothiazide (MICROZIDE) 12.5 MG capsule Take 1 capsule (12.5 mg total) by mouth daily. 90 capsule 3  . isosorbide mononitrate (IMDUR) 30 MG 24 hr tablet TAKE ONE-HALF (1/2) TABLET DAILY 45 tablet 3  . loratadine (CLARITIN) 10 MG tablet Take 1 tablet (10 mg total) by mouth daily. 10 tablet 0  . losartan (COZAAR) 100 MG tablet Take 0.5 tablets (50 mg total) by mouth daily. 45 tablet 2  . Multiple Vitamins-Minerals (PRESERVISION AREDS 2) CAPS Take 1 capsule by mouth daily.     . nitroGLYCERIN (NITROLINGUAL) 0.4 MG/SPRAY spray Place 1 spray under the tongue every 5 (five) minutes x 3 doses as needed for chest pain.      No current facility-administered medications for this visit.     Allergies:   Morphine and Ramipril   Social History:  The patient  reports that he has quit smoking. His smoking use included Cigars. He quit after 29.00 years of use. He has never used smokeless tobacco. He reports that he drinks alcohol. He reports that he does not use drugs.   Family  History:  The patient's family history includes Colon cancer in his other; Hypertension in his father, mother, and other.  ROS:  Please see the history of present illness.  All other systems are reviewed and otherwise negative.   PHYSICAL EXAM:  VS:  BP 117/68   Pulse 70   Ht 5\' 6"  (1.676 m)   Wt 165 lb (74.8 kg)   BMI 26.63 kg/m  BMI: Body mass index is 26.63 kg/m. Well nourished, well developed, in no acute distress  HEENT: normocephalic, atraumatic  Neck: no JVD, carotid bruits or masses Cardiac:  RRR; no significant murmurs, no rubs, or gallops Lungs: CTA b/l, no wheezing, rhonchi or rales  Abd: soft, nontender MS: no deformity, age appropriate atrophy Ext: no edema  Skin: warm  and dry, no rash Neuro:  No gross deficits appreciated Psych: euthymic mood, full affect  ICD site (R) is stable, no tethering or discomfort   EKG:  Not done today ICD interrogation done today and reviewed by myself: battery and lead measurements are good, one AMS is brief ATach, one NSVT (11 seconds may last year)  03/30/17: TTE Study Conclusions - Left ventricle: The cavity size was normal. Wall thickness was   increased in a pattern of moderate to severe LVH. There was focal   basal hypertrophy. Systolic function was moderately to severely   reduced. The estimated ejection fraction was in the range of 30%   to 35%. Features are consistent with a pseudonormal left   ventricular filling pattern, with concomitant abnormal relaxation   and increased filling pressure (grade 2 diastolic dysfunction). - Aortic valve: Moderately calcified annulus. Moderately thickened,   moderately calcified leaflets. There was trivial regurgitation. - Mitral valve: There was mild to moderate regurgitation. - Right ventricle: Systolic function was mildly to moderately   reduced. - Pulmonary arteries: Systolic pressure was mildly increased.  03/26/16: LHC/PCI  Prox RCA lesion, 100% stenosed.  Mid LAD lesion, 100%  stenosed.  LIMA was injected is normal in caliber, and is anatomically normal.  SVG was injected .  There is severe disease in the graft.  Occluded.  SVG was not injected .  Known to be occluded.  Origin lesion, 100% stenosed.  Origin lesion, 100% stenosed.  There is moderate left ventricular systolic dysfunction.  Ost Ramus to Ramus lesion, 99% stenosed. Post intervention, there is a 0% residual stenosis. 1. Severe triple vessel s/p 3V CABG with 1/3 patent bypass grafts.  2. Occluded mid LAD with patent LIMA to LAD 3. Severe stenosis in the proximal segment of the moderate caliber Ramus branch. The vein graft to this branch is now occluded.  4. Chronic occlusion mid RCA. The vein graft to this branch is known to be occluded.  5. Moderate LV systolic dysfunction, chronic.  6. Successful PTCA/DES x 2 Ramus intermediate branch.  Recommendations: Will continue ASA and Brilinta for now. He will be randomized in the TWILIGHT study (ASA/Brlinta for 3 months, then at 3 months, he will either stop ASA and continue Brilinta alone or continue both meds). Continue statin and beta blocker.    Recent Labs: 12/24/2016: ALT 14; TSH 3.71 06/18/2017: BUN 17; Creatinine, Ser 1.17; Hemoglobin 12.1; Platelets 266; Potassium 3.8; Sodium 133  09/18/2016: LDL Calculated 32 12/24/2016: Cholesterol 101; Direct LDL 35.0; HDL 27.40; Total CHOL/HDL Ratio 4; Triglycerides 209.0; VLDL 41.8   Estimated Creatinine Clearance: 41.7 mL/min (by C-G formula based on SCr of 1.17 mg/dL).   Wt Readings from Last 3 Encounters:  06/30/17 165 lb (74.8 kg)  06/18/17 164 lb (74.4 kg)  06/05/17 165 lb (74.8 kg)     Other studies reviewed: Additional studies/records reviewed today include: summarized above  ASSESSMENT AND PLAN:  1. ICD     Intact function, cyber security upgrade was completed, no programming changes were made  2. ICM, chronic CHF     No exam findings or symptoms to suggest fluid OL     CorVue is  at threshold     Weight is stable     On BB/ARB, diuretic, BMET this month looked Ok  3. CAD     No anginal c/o     On ASA, plavix, statin, BB     C/w dr. Acie Fredrickson, sees him in feb  4. HTN  Looks good, no changes    Disposition: F/u with q 3 month remotes, EP visit in 1 year sooner if needed.  Current medicines are reviewed at length with the patient today.  The patient did not have any concerns regarding medicines.  Venetia Night, PA-C 06/30/2017 11:51 AM     [address] [place] [address] [phone] (office)  [phone] (fax)

## 2017-06-30 ENCOUNTER — Encounter (INDEPENDENT_AMBULATORY_CARE_PROVIDER_SITE_OTHER): Payer: Self-pay

## 2017-06-30 ENCOUNTER — Ambulatory Visit (INDEPENDENT_AMBULATORY_CARE_PROVIDER_SITE_OTHER): Payer: Medicare Other | Admitting: Physician Assistant

## 2017-06-30 VITALS — BP 117/68 | HR 70 | Ht 66.0 in | Wt 165.0 lb

## 2017-06-30 DIAGNOSIS — I10 Essential (primary) hypertension: Secondary | ICD-10-CM

## 2017-06-30 DIAGNOSIS — Z95810 Presence of automatic (implantable) cardiac defibrillator: Secondary | ICD-10-CM

## 2017-06-30 DIAGNOSIS — I2510 Atherosclerotic heart disease of native coronary artery without angina pectoris: Secondary | ICD-10-CM

## 2017-06-30 DIAGNOSIS — I5022 Chronic systolic (congestive) heart failure: Secondary | ICD-10-CM | POA: Diagnosis not present

## 2017-06-30 NOTE — Patient Instructions (Addendum)
Medication Instructions:   Your physician recommends that you continue on your current medications as directed. Please refer to the Current Medication list given to you today.   If you need a refill on your cardiac medications before your next appointment, please call your pharmacy.  Labwork: NONE ORDERED  TODAY    Testing/Procedures: NONE ORDERED  TODAY    Follow-Up: Your physician wants you to follow-up in: [place] will receive a reminder letter in the mail two months in advance. If you don't receive a letter, please call our office to schedule the follow-up appointment.     Remote monitoring is used to monitor your Pacemaker of ICD from home. This monitoring reduces the number of office visits required to check your device to one time per year. It allows [place] to keep an eye on the functioning of your device to ensure it is working properly. You are scheduled for a device check from home on .  07-24-2017 You may send your transmission at any time that day. If you have a wireless device, the transmission will be sent automatically. After your physician reviews your transmission, you will receive a postcard with your next transmission date.     Any Other Special Instructions Will Be Listed Below (If Applicable).

## 2017-07-01 NOTE — Progress Notes (Signed)
[name] [address], [place] [address] Phone: [phone] Subjective:    I'm seeing this patient by the request  of:  Plotnikov, Evie Lacks, MD   CC: Right arm pain  IRS:[id number]  [name] [name] is a 83 y.o. male coming in with complaint of arm pain. He has a catheterization last July and 4 months later he started having pain from his elbow down to this hand. He states that he has some numbness on the posterior aspect of his hand. He hasn't used his arm for a while so he said it feels tight. He states that he also do not have a lot of strength in his arm as he feels weak as it is hard to hold up a pot of coffee Patient rates the severity of pain a 6 out of 10.   history of degenerative disc disease and facet disease of the cervical spine. Last x-rays were in 2011. These were independently visualized by me  Past Medical History:  Diagnosis Date  . AICD (automatic cardioverter/defibrillator) present   . Arrhythmia   . Arthritis    "hands, legs" (03/26/2016)  . 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 Chronic systolic CHF (congestive heart failure) ([place])    a. Echo 9/14: Mild LVH, mild focal basal septal hypertrophy, EF 35-40%, anteroseptal HK, normal diastolic function, mild MR, mild LAE, PASP 34 mmHg  . Chronotropic incompetence    treated w/pacemaker, ICD 7.2008. This was placed after CPX done post 2008 cath. CPX showed only chronotropic incompetence  . History of blood transfusion 1969   "after getting MSO4; it destroys my corpuscles" (03/26/2016)  . History of colon polyps   . Hyperlipidemia   . Hypertension   . Migraine    "just before my bypass" (03/26/2016)    . Mitral regurgitation    mild  . Myocardial infarction ([place]) "several"  . Numbness and tingling of left arm and leg    improved after tx w/prednisone  . OSA (obstructive sleep apnea) 09/01/2016   Severe OSA with AHI 38/hr now on BiPAP at 21/17cm H2O  . Syncopal episodes    relative hypotension   Past Surgical History:  Procedure Laterality Date  . BACK SURGERY    . CARDIAC CATHETERIZATION  1980s-1990s X 4  . CARDIAC CATHETERIZATION N/A 03/26/2016   Procedure: Left Heart Cath and Cors/Grafts Angiography;  Surgeon: Burnell Blanks, MD;  Location: [facility];  Service: Cardiovascular;  Laterality: N/A;  . CARDIAC CATHETERIZATION N/A 03/26/2016   Procedure: Coronary Stent Intervention;  Surgeon: Burnell Blanks, MD;  Location: [facility];  Service: Cardiovascular;  Laterality: N/A;  . CARDIAC DEFIBRILLATOR PLACEMENT  2008  . CATARACT EXTRACTION W/ INTRAOCULAR LENS  IMPLANT, BILATERAL Bilateral 08/2013 - 09/2013   right - left   . CORONARY ANGIOPLASTY WITH STENT PLACEMENT  ~ 1992; 03/26/2016   "1 + 2"  . CORONARY ARTERY BYPASS GRAFT  1992   CABG "X4"  . CYSTOSCOPY  1980s  . GAS/FLUID EXCHANGE Right 06/18/2017   Procedure: GAS/FLUID EXCHANGE (C3F8);  Surgeon: Sherlynn Stalls, MD;  Location: [facility];  Service: Ophthalmology;  Laterality: Right;  . IMPLANTABLE CARDIOVERTER DEFIBRILLATOR (ICD) GENERATOR CHANGE N/A 06/21/2014   Procedure: ICD GENERATOR CHANGE;  Surgeon: Deboraha Sprang, MD;  Location: [facility];  Service: Cardiovascular;  Laterality: N/A;  . PARS PLANA VITRECTOMY Right 06/18/2017   Procedure: PARS PLANA VITRECTOMY WITH 25 GAUGE;  Surgeon: Sherlynn Stalls, MD;  Location: [facility];  Service: Ophthalmology;  Laterality: Right;  . PHOTOCOAGULATION WITH LASER Right 06/18/2017   Procedure: PHOTOCOAGULATION WITH LASER- ENDO LASER;  Surgeon: Sherlynn Stalls, MD;  Location: [facility];  Service: Ophthalmology;  Laterality: Right;  . POSTERIOR LUMBAR FUSION  ~ 1989    "fused 2 discs"  . TIBIA FRACTURE SURGERY Left 1943   "put artificial bone in there"   Social History   Social History  . Marital status: Married    Spouse name: N/A  . Number of children: N/A  . Years of education: N/A   Occupational History  . Retired    Social History Main Topics  . Smoking status: Former Smoker    Years: 29.00    Types: Cigars  . Smokeless tobacco: Never Used     Comment: quit smoking cigars in 1973  . Alcohol use Yes     Comment: "quit drinking in 1973"  . Drug use: No  . Sexual activity: Yes   Other Topics Concern  . Not on file   Social History Narrative   Widowed - 06/2010   Allergies  Allergen Reactions  . Morphine     "increased white corpuscles"  . Ramipril Other (See Comments)    REACTION: cough if dose is over 2.5mg    Family History  Problem Relation Age of Onset  . Hypertension Mother   . Hypertension Father   . Hypertension Other   . Colon cancer Other        1st degree relative <50     Past medical history, social, surgical and family history all reviewed in electronic medical record.  No pertanent information unless stated regarding to the chief complaint.   Review of Systems:Review of systems updated and as accurate as of 07/01/17  No headache, visual changes, nausea, vomiting, diarrhea, constipation, dizziness, abdominal pain, skin rash, fevers, chills, night sweats, weight loss, swollen lymph nodes, body aches, joint swelling, muscle aches, chest pain, shortness of breath, mood changes.   Objective  There were no vitals taken for this visit. Systems examined below as of 07/01/17   General: No apparent distress alert and oriented x3 mood and affect normal, dressed appropriately.  HEENT: Pupils Abnormal but just had surgery for retinal detachment Respiratory: Patient's speak in full sentences and does not appear short of breath  Cardiovascular: No lower extremity edema, non tender, no erythema  Skin: Warm dry intact with  no signs of infection or rash on extremities or on axial skeleton.  Abdomen: Soft nontender  Neuro: Cranial nerves II through XII are intact, neurovascularly intact in all extremities with 2+ DTRs and 2+ pulses.  Lymph: No lymphadenopathy of posterior or anterior cervical chain or axillae bilaterally.  Gait Antalgic gait MSK:  Non tender with full range of motion and good stability and symmetric strength and tone of shoulders, , wrist, hip, knee and ankles bilaterally. Moderate changes of multiple joints Elbow: Left Unremarkable to inspection. Mild decrease in range of motion lacking the last 5 of supination. Stable to varus, valgus stress. Negative moving valgus stress test. Pain over the lateral epicondylar region Ulnar nerve does not sublux. Negative cubital tunnel Tinel's. Contralateral elbow  unremarkable  Musculoskeletal ultrasound was performed and interpreted by Charlann Boxer D.O.   Elbow:  Lateral epicondyle and common extensor tendon origin visualized. Articular side tear noted with calcific changes. Mild retraction. Hypoechoic changes noted as well as increasing Doppler flow.  IMPRESSION:  Severe lateral epicondylitis with partial tendon tear    [id number]; 15 additional minutes spent for Therapeutic exercises as stated in above notes.  This included exercises focusing on stretching, strengthening, with significant focus on eccentric aspects.   Long term goals include an improvement in range of motion, strength, endurance as well as avoiding reinjury. Patient's frequency would include in 1-2 times a day, 3-5 times a week for a duration of 6-12 weeks. Lateral Epicondylitis: Elbow anatomy was reviewed, and tendinopathy was explained.  Pt. given a formal rehab program. Series of concentric and eccentric exercises should be done starting with no weight, work up to 1 lb, hammer, etc.  Use counterforce strap if working or using hands.  Formal PT would be beneficial. Emphasized stretching  an cross-friction massage Emphasized proper palms up lifting biomechanics to unload ECRB theraband given    Proper technique shown and discussed handout in great detail with ATC.  All questions were discussed and answered.   Impression and Recommendations:     This case required medical decision making of moderate complexity.      Note: This dictation was prepared with Dragon dictation along with smaller phrase technology. Any transcriptional errors that result from this process are unintentional.

## 2017-07-02 ENCOUNTER — Ambulatory Visit: Payer: Self-pay

## 2017-07-02 ENCOUNTER — Ambulatory Visit (INDEPENDENT_AMBULATORY_CARE_PROVIDER_SITE_OTHER): Payer: Medicare Other | Admitting: Family Medicine

## 2017-07-02 ENCOUNTER — Encounter: Payer: Self-pay | Admitting: Family Medicine

## 2017-07-02 VITALS — BP 120/60 | HR 80 | Ht 66.0 in | Wt 166.0 lb

## 2017-07-02 DIAGNOSIS — M7712 Lateral epicondylitis, left elbow: Secondary | ICD-10-CM | POA: Diagnosis not present

## 2017-07-02 DIAGNOSIS — M79602 Pain in left arm: Secondary | ICD-10-CM | POA: Diagnosis not present

## 2017-07-02 NOTE — Assessment & Plan Note (Signed)
Patient does have more of a lateral colitis. Discussed with patient at great length. Home exercises given. Discussed wrist immobilization. Topical anti-inflammatories given. Worsening symptoms we'll consider injection. Patient is a greater with the plan will follow-up again in 4 weeks

## 2017-07-02 NOTE — Patient Instructions (Signed)
Good to see you.  Ice 20 minutes 2 times daily. Usually after activity and before bed. Exercises 3 times a week.  pennsaid pinkie amount topically 2 times daily as needed.  Avoid overhand lifting Wear wrist brace day and night for 1 week then nightly for 2 weeks.  No heavy lifting for a while.  Compression sleeve could help  See me again in 4 weeks.

## 2017-07-03 ENCOUNTER — Other Ambulatory Visit: Payer: Self-pay | Admitting: Internal Medicine

## 2017-07-03 DIAGNOSIS — Z95810 Presence of automatic (implantable) cardiac defibrillator: Secondary | ICD-10-CM

## 2017-07-03 DIAGNOSIS — I5022 Chronic systolic (congestive) heart failure: Secondary | ICD-10-CM

## 2017-07-03 DIAGNOSIS — I4589 Other specified conduction disorders: Secondary | ICD-10-CM

## 2017-07-03 DIAGNOSIS — I255 Ischemic cardiomyopathy: Secondary | ICD-10-CM

## 2017-07-12 ENCOUNTER — Other Ambulatory Visit: Payer: Self-pay | Admitting: Internal Medicine

## 2017-07-12 ENCOUNTER — Other Ambulatory Visit: Payer: Self-pay | Admitting: Cardiovascular Disease

## 2017-07-27 DIAGNOSIS — G4733 Obstructive sleep apnea (adult) (pediatric): Secondary | ICD-10-CM | POA: Diagnosis not present

## 2017-07-28 ENCOUNTER — Encounter: Payer: Self-pay | Admitting: Cardiology

## 2017-07-30 ENCOUNTER — Ambulatory Visit (INDEPENDENT_AMBULATORY_CARE_PROVIDER_SITE_OTHER): Payer: Medicare Other | Admitting: Family Medicine

## 2017-07-30 DIAGNOSIS — M7712 Lateral epicondylitis, left elbow: Secondary | ICD-10-CM | POA: Diagnosis not present

## 2017-07-30 NOTE — Patient Instructions (Signed)
Good to see you  Ice is still your friend Continue the compression while working with the horses Try to do my exercises 2 times a week  pennsaid pinkie amount topically 2 times daily as needed.  See me again in 6-8 weeks if not perfect

## 2017-07-30 NOTE — Progress Notes (Signed)
[name] [address] [place], [place] [address] Phone: [phone] Subjective:     CC: Elbow pain follow-up  WEX:[id number]  [name] [name] is a 83 y.o. male coming in with complaint of elbow pain.  Patient was seen 1 month ago he did have severe lateral epicondylitis with a partial tear of the common extensor tendon.  Patient was to do home exercises, icing regimen, topical anti-inflammatories and wearing a wrist brace.  Patient states  He is feeling 80-90% better.  Still some mild discomfort from time to time.  No longer having any swelling.  States that the strength seems to be coming back as well     Past Medical History:  Diagnosis Date  . AICD (automatic cardioverter/defibrillator) present   . Arrhythmia   . Arthritis    "hands, legs" (03/26/2016)  . 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 Chronic systolic CHF (congestive heart failure) ([place])    a. Echo 9/14: Mild LVH, mild focal basal septal hypertrophy, EF 35-40%, anteroseptal HK, normal diastolic function, mild MR, mild LAE, PASP 34 mmHg  . Chronotropic incompetence    treated w/pacemaker, ICD 7.2008. This was placed after CPX done post 2008 cath. CPX showed only chronotropic incompetence  . History of blood transfusion 1969   "after getting MSO4; it destroys my corpuscles" (03/26/2016)  . History of colon polyps   . Hyperlipidemia   . Hypertension   . Migraine    "just before my bypass" (03/26/2016)  . Mitral regurgitation    mild  . Myocardial infarction ([place]) "several"  . Numbness and tingling of left arm and leg    improved after tx w/prednisone  . OSA (obstructive sleep apnea) 09/01/2016   Severe OSA  with AHI 38/hr now on BiPAP at 21/17cm H2O  . Syncopal episodes    relative hypotension   Past Surgical History:  Procedure Laterality Date  . BACK SURGERY    . CARDIAC CATHETERIZATION  1980s-1990s X 4  . CARDIAC CATHETERIZATION N/A 03/26/2016   Procedure: Left Heart Cath and Cors/Grafts Angiography;  Surgeon: Burnell Blanks, MD;  Location: [facility];  Service: Cardiovascular;  Laterality: N/A;  . CARDIAC CATHETERIZATION N/A 03/26/2016   Procedure: Coronary Stent Intervention;  Surgeon: Burnell Blanks, MD;  Location: [facility];  Service: Cardiovascular;  Laterality: N/A;  . CARDIAC DEFIBRILLATOR PLACEMENT  2008  . CATARACT EXTRACTION W/ INTRAOCULAR LENS  IMPLANT, BILATERAL Bilateral 08/2013 - 09/2013   right - left   . CORONARY ANGIOPLASTY WITH STENT PLACEMENT  ~ 1992; 03/26/2016   "1 + 2"  . CORONARY ARTERY BYPASS GRAFT  1992   CABG "X4"  . CYSTOSCOPY  1980s  . GAS/FLUID EXCHANGE Right 06/18/2017   Procedure: GAS/FLUID EXCHANGE (C3F8);  Surgeon: Sherlynn Stalls, MD;  Location: [facility];  Service: Ophthalmology;  Laterality: Right;  . IMPLANTABLE CARDIOVERTER DEFIBRILLATOR (ICD) GENERATOR CHANGE N/A 06/21/2014   Procedure: ICD GENERATOR CHANGE;  Surgeon: Deboraha Sprang, MD;  Location: [facility];  Service: Cardiovascular;  Laterality: N/A;  . PARS PLANA VITRECTOMY Right 06/18/2017   Procedure: PARS PLANA VITRECTOMY WITH 25 GAUGE;  Surgeon: Baird Cancer,  [name] Cornea, MD;  Location: [facility];  Service: Ophthalmology;  Laterality: Right;  . PHOTOCOAGULATION WITH LASER Right 06/18/2017   Procedure: PHOTOCOAGULATION WITH LASER- ENDO LASER;  Surgeon: Sherlynn Stalls, MD;  Location: [facility];  Service: Ophthalmology;  Laterality: Right;  . POSTERIOR LUMBAR FUSION  ~ 1989   "fused 2 discs"  . TIBIA FRACTURE SURGERY Left 1943   "put artificial bone in there"   Social History   Socioeconomic History  . Marital status: Married    Spouse name: Not on file  . Number of children: Not on  file  . Years of education: Not on file  . Highest education level: Not on file  Social Needs  . Financial resource strain: Not on file  . Food insecurity - worry: Not on file  . Food insecurity - inability: Not on file  . Transportation needs - medical: Not on file  . Transportation needs - non-medical: Not on file  Occupational History  . Occupation: Retired  Tobacco Use  . Smoking status: Former Smoker    Years: 29.00    Types: Cigars  . Smokeless tobacco: Never Used  . Tobacco comment: quit smoking cigars in 1973  Substance and Sexual Activity  . Alcohol use: Yes    Comment: "quit drinking in 1973"  . Drug use: No  . Sexual activity: Yes  Other Topics Concern  . Not on file  Social History Narrative   Widowed - 06/2010   Allergies  Allergen Reactions  . Morphine     "increased white corpuscles"  . Ramipril Other (See Comments)    REACTION: cough if dose is over 2.5mg    Family History  Problem Relation Age of Onset  . Hypertension Mother   . Hypertension Father   . Hypertension Other   . Colon cancer Other        1st degree relative <50     Past medical history, social, surgical and family history all reviewed in electronic medical record.  No pertanent information unless stated regarding to the chief complaint.   Review of Systems:Review of systems updated and as accurate as of 07/30/17  No headache, visual changes, nausea, vomiting, diarrhea, constipation, dizziness, abdominal pain, skin rash, fevers, chills, night sweats, weight loss, swollen lymph nodes, body aches, joint swelling,  chest pain, shortness of breath, mood changes.  Positive muscle aches  Objective  Blood pressure 115/60, pulse 72, height 5\' 6"  (1.676 m), weight 167 lb (75.8 kg), SpO2 97 %. Systems examined below as of 07/30/17   General: No apparent distress alert and oriented x3 mood and affect normal, dressed appropriately.  HEENT: Pupils equal, extraocular movements intact  Respiratory:  Patient's speak in full sentences and does not appear short of breath  Cardiovascular: No lower extremity edema, non tender, no erythema  Skin: Warm dry intact with no signs of infection or rash on extremities or on axial skeleton.  Abdomen: Soft nontender  Neuro: Cranial nerves II through XII are intact, neurovascularly intact in all extremities with 2+ DTRs and 2+ pulses.  Lymph: No lymphadenopathy of posterior or anterior cervical chain or axillae bilaterally.  Gait normal with good balance and coordination.  MSK:  Non tender with full range of motion and good stability and symmetric strength and tone of shoulders,  wrist, hip, knee and ankles bilaterally.  Elbow: Left Mild swelling noted Range of motion full pronation, supination, flexion, extension. Strength is full to all of the above directions Stable to varus, valgus stress. Negative moving valgus  stress test. Mild discomfort over the lateral epicondylar region Ulnar nerve does not sublux. Negative cubital tunnel Tinel's.      Impression and Recommendations:     This case required medical decision making of moderate complexity.      Note: This dictation was prepared with Dragon dictation along with smaller phrase technology. Any transcriptional errors that result from this process are unintentional.

## 2017-07-30 NOTE — Assessment & Plan Note (Signed)
Improving at this time.  No change in management.  Patient has worsening symptoms could consider injections.  Patient has many other health comorbidities that will monitor.  Patient will follow up with me in 6-8 weeks.

## 2017-08-03 ENCOUNTER — Ambulatory Visit (INDEPENDENT_AMBULATORY_CARE_PROVIDER_SITE_OTHER): Payer: Medicare Other | Admitting: *Deleted

## 2017-08-03 DIAGNOSIS — I255 Ischemic cardiomyopathy: Secondary | ICD-10-CM | POA: Diagnosis not present

## 2017-08-04 NOTE — Progress Notes (Signed)
Remote ICD transmission.   

## 2017-08-05 LAB — CUP PACEART REMOTE DEVICE CHECK
Battery Remaining Percentage: 66 %
Battery Voltage: 2.95 V
Brady Statistic AP VP Percent: 4.5 %
Brady Statistic RV Percent Paced: 4.6 %
HighPow Impedance: 77 Ohm
HighPow Impedance: 77 Ohm
Implantable Lead Implant Date: 20080711
Implantable Lead Implant Date: 20080711
Implantable Lead Location: 753860
Implantable Pulse Generator Implant Date: 20151007
Lead Channel Impedance Value: 380 Ohm
Lead Channel Pacing Threshold Amplitude: 0.5 V
Lead Channel Pacing Threshold Pulse Width: 0.5 ms
Lead Channel Pacing Threshold Pulse Width: 0.5 ms
Lead Channel Sensing Intrinsic Amplitude: 3.4 mV
Lead Channel Setting Sensing Sensitivity: 0.5 mV
MDC IDC LEAD LOCATION: 753859
MDC IDC MSMT BATTERY REMAINING LONGEVITY: 56 mo
MDC IDC MSMT LEADCHNL RA IMPEDANCE VALUE: 390 Ohm
MDC IDC MSMT LEADCHNL RV PACING THRESHOLD AMPLITUDE: 0.75 V
MDC IDC MSMT LEADCHNL RV SENSING INTR AMPL: 12 mV
MDC IDC SESS DTM: 20181119070020
MDC IDC SET LEADCHNL RA PACING AMPLITUDE: 1.5 V
MDC IDC SET LEADCHNL RV PACING AMPLITUDE: 2.5 V
MDC IDC SET LEADCHNL RV PACING PULSEWIDTH: 0.5 ms
MDC IDC STAT BRADY AP VS PERCENT: 91 %
MDC IDC STAT BRADY AS VP PERCENT: 1 %
MDC IDC STAT BRADY AS VS PERCENT: 4.8 %
MDC IDC STAT BRADY RA PERCENT PACED: 94 %
Pulse Gen Serial Number: 7225183

## 2017-08-07 ENCOUNTER — Other Ambulatory Visit: Payer: Self-pay | Admitting: Internal Medicine

## 2017-08-13 ENCOUNTER — Encounter: Payer: Self-pay | Admitting: Cardiology

## 2017-08-25 ENCOUNTER — Ambulatory Visit: Payer: Medicare Other | Admitting: Internal Medicine

## 2017-09-14 ENCOUNTER — Ambulatory Visit: Payer: Medicare Other | Admitting: Internal Medicine

## 2017-09-24 ENCOUNTER — Other Ambulatory Visit (INDEPENDENT_AMBULATORY_CARE_PROVIDER_SITE_OTHER): Payer: Medicare Other

## 2017-09-24 ENCOUNTER — Ambulatory Visit (INDEPENDENT_AMBULATORY_CARE_PROVIDER_SITE_OTHER): Payer: Medicare Other | Admitting: Internal Medicine

## 2017-09-24 ENCOUNTER — Ambulatory Visit: Payer: Medicare Other | Admitting: Cardiology

## 2017-09-24 ENCOUNTER — Encounter: Payer: Self-pay | Admitting: Internal Medicine

## 2017-09-24 VITALS — BP 118/66 | HR 74 | Temp 98.4°F | Ht 66.0 in | Wt 170.0 lb

## 2017-09-24 DIAGNOSIS — I110 Hypertensive heart disease with heart failure: Secondary | ICD-10-CM

## 2017-09-24 DIAGNOSIS — I255 Ischemic cardiomyopathy: Secondary | ICD-10-CM | POA: Diagnosis not present

## 2017-09-24 DIAGNOSIS — Z23 Encounter for immunization: Secondary | ICD-10-CM

## 2017-09-24 DIAGNOSIS — I5020 Unspecified systolic (congestive) heart failure: Secondary | ICD-10-CM | POA: Diagnosis not present

## 2017-09-24 DIAGNOSIS — I2510 Atherosclerotic heart disease of native coronary artery without angina pectoris: Secondary | ICD-10-CM | POA: Diagnosis not present

## 2017-09-24 LAB — BASIC METABOLIC PANEL
BUN: 27 mg/dL — AB (ref 6–23)
CO2: 28 meq/L (ref 19–32)
CREATININE: 1.21 mg/dL (ref 0.40–1.50)
Calcium: 9.2 mg/dL (ref 8.4–10.5)
Chloride: 103 mEq/L (ref 96–112)
GFR: 60.43 mL/min (ref >60.00)
GLUCOSE: 94 mg/dL (ref 70–99)
POTASSIUM: 4.5 meq/L (ref 3.5–5.1)
Sodium: 138 mEq/L (ref 135–145)

## 2017-09-24 LAB — HEPATIC FUNCTION PANEL
ALK PHOS: 109 U/L (ref 39–117)
ALT: 17 U/L (ref 0–53)
AST: 14 U/L (ref 0–37)
Albumin: 4.2 g/dL (ref 3.5–5.2)
BILIRUBIN DIRECT: 0.2 mg/dL (ref 0.0–0.3)
BILIRUBIN TOTAL: 0.7 mg/dL (ref 0.2–1.2)
TOTAL PROTEIN: 7 g/dL (ref 6.0–8.3)

## 2017-09-24 NOTE — Patient Instructions (Addendum)
MC well w/Jill  Shingrix vaccine

## 2017-09-24 NOTE — Assessment & Plan Note (Signed)
Imdur, Lipitor, Coreg, ASA

## 2017-09-24 NOTE — Progress Notes (Signed)
Subjective:  Patient ID: [name] [name], male    DOB: 06-Sep-1929  Age: 84 y.o. MRN: [id number]  CC: No chief complaint on file.   HPI [name] [name] presents for CAD, HTN, dyslipidemia f/u He cares for 4 horses  Outpatient Medications Prior to Visit  Medication Sig Dispense Refill  . aspirin 81 MG tablet Take 1 tablet (81 mg total) by mouth daily. 30 tablet 11  . atorvastatin (LIPITOR) 40 MG tablet TAKE 1 TABLET DAILY 90 tablet 3  . carvedilol (COREG) 25 MG tablet TAKE ONE-HALF (1/2) TABLET TWICE A DAY 90 tablet 2  . Cholecalciferol 1000 UNITS tablet Take 1,000 Units by mouth daily.      . clopidogrel (PLAVIX) 75 MG tablet Take 1 tablet (75 mg total) by mouth daily. 90 tablet 3  . hydrochlorothiazide (MICROZIDE) 12.5 MG capsule TAKE 1 CAPSULE DAILY 90 capsule 3  . isosorbide mononitrate (IMDUR) 30 MG 24 hr tablet TAKE ONE-HALF (1/2) TABLET DAILY 45 tablet 3  . loratadine (CLARITIN) 10 MG tablet Take 1 tablet (10 mg total) by mouth daily. 10 tablet 0  . losartan (COZAAR) 100 MG tablet TAKE ONE-HALF (1/2) TABLET DAILY 45 tablet 3  . Multiple Vitamins-Minerals (PRESERVISION AREDS 2) CAPS Take 1 capsule by mouth daily.     . nitroGLYCERIN (NITROLINGUAL) 0.4 MG/SPRAY spray Place 1 spray under the tongue every 5 (five) minutes x 3 doses as needed for chest pain.     . ciprofloxacin (CILOXAN) 0.3 % ophthalmic solution Place 1 drop into the right eye 4 (four) times daily.    . DUREZOL 0.05 % EMUL Place 1 drop into the right eye 2 (two) times daily. START AFTER SURGERY     No facility-administered medications prior to visit.     ROS Review of Systems  Constitutional: Negative for appetite change, fatigue and unexpected weight change.  HENT: Negative for congestion, nosebleeds, sneezing, sore throat and trouble swallowing.   Eyes: Negative for itching and visual disturbance.  Respiratory: Negative for cough.   Cardiovascular: Negative for chest pain, palpitations and leg swelling.    Gastrointestinal: Negative for abdominal distention, blood in stool, diarrhea and nausea.  Genitourinary: Negative for frequency and hematuria.  Musculoskeletal: Positive for arthralgias. Negative for back pain, gait problem, joint swelling and neck pain.  Skin: Negative for rash.  Neurological: Negative for dizziness, tremors, speech difficulty and weakness.  Psychiatric/Behavioral: Negative for agitation, dysphoric mood and sleep disturbance. The patient is not nervous/anxious.     Objective:  BP 118/66 (BP Location: Left Arm, Patient Position: Sitting, Cuff Size: Large)   Pulse 74   Temp 98.4 F (36.9 C) (Oral)   Ht 5\' 6"  (1.676 m)   Wt 170 lb (77.1 kg)   SpO2 98%   BMI 27.44 kg/m   BP Readings from Last 3 Encounters:  09/24/17 118/66  07/30/17 115/60  07/02/17 120/60    Wt Readings from Last 3 Encounters:  09/24/17 170 lb (77.1 kg)  07/30/17 167 lb (75.8 kg)  07/02/17 166 lb (75.3 kg)    Physical Exam  Constitutional: He is oriented to person, place, and time. He appears well-developed. No distress.  NAD  HENT:  Mouth/Throat: Oropharynx is clear and moist.  Eyes: Conjunctivae are normal. Pupils are equal, round, and reactive to light.  Neck: Normal range of motion. No JVD present. No thyromegaly present.  Cardiovascular: Normal rate, regular rhythm, normal heart sounds and intact distal pulses. Exam reveals no gallop and no friction rub.  No  murmur heard. Pulmonary/Chest: Effort normal and breath sounds normal. No respiratory distress. He has no wheezes. He has no rales. He exhibits no tenderness.  Abdominal: Soft. Bowel sounds are normal. He exhibits no distension and no mass. There is no tenderness. There is no rebound and no guarding.  Musculoskeletal: Normal range of motion. He exhibits no edema or tenderness.  Lymphadenopathy:    He has no cervical adenopathy.  Neurological: He is alert and oriented to person, place, and time. He has normal reflexes. No cranial  nerve deficit. He exhibits normal muscle tone. He displays a negative Romberg sign. Coordination and gait normal.  Skin: Skin is warm and dry. No rash noted.  Psychiatric: He has a normal mood and affect. His behavior is normal. Judgment and thought content normal.    Lab Results  Component Value Date   WBC 8.7 06/18/2017   HGB 12.1 (L) 06/18/2017   HCT 38.2 (L) 06/18/2017   PLT 266 06/18/2017   GLUCOSE 101 (H) 06/18/2017   CHOL 101 12/24/2016   TRIG 209.0 (H) 12/24/2016   HDL 27.40 (L) 12/24/2016   LDLDIRECT 35.0 12/24/2016   LDLCALC 32 09/18/2016   ALT 14 12/24/2016   AST 15 12/24/2016   NA 133 (L) 06/18/2017   K 3.8 06/18/2017   CL 97 (L) 06/18/2017   CREATININE 1.17 06/18/2017   BUN 17 06/18/2017   CO2 27 06/18/2017   TSH 3.71 12/24/2016   PSA 3.11 12/24/2016   INR 1.0 03/25/2016   HGBA1C 6.4 12/24/2016    No results found.  Assessment & Plan:   There are no diagnoses linked to this encounter. I have discontinued Shanon Brow B. Cowens's ciprofloxacin and DUREZOL. I am also having him maintain his nitroGLYCERIN, Cholecalciferol, aspirin, PRESERVISION AREDS 2, loratadine, isosorbide mononitrate, atorvastatin, hydrochlorothiazide, losartan, clopidogrel, and carvedilol.  No orders of the defined types were placed in this encounter.    Follow-up: No Follow-up on file.  Walker Kehr, MD

## 2017-09-24 NOTE — Addendum Note (Signed)
Addended by: Karren Cobble on: 09/24/2017 12:22 PM   Modules accepted: Orders

## 2017-09-24 NOTE — Assessment & Plan Note (Signed)
Imdur, Lipitor, Coreg, ASA No CP

## 2017-09-24 NOTE — Assessment & Plan Note (Signed)
Ramipril, Losartan, Coreg per Cardiology

## 2017-10-01 IMAGING — DX DG FOREARM 2V*L*
2 series · 2 of 2 positions shown · non-contrast
Comparison: None available

CLINICAL DATA: Left forearm pain and swelling

EXAM:
LEFT FOREARM - 2 VIEW

[forearm ap]
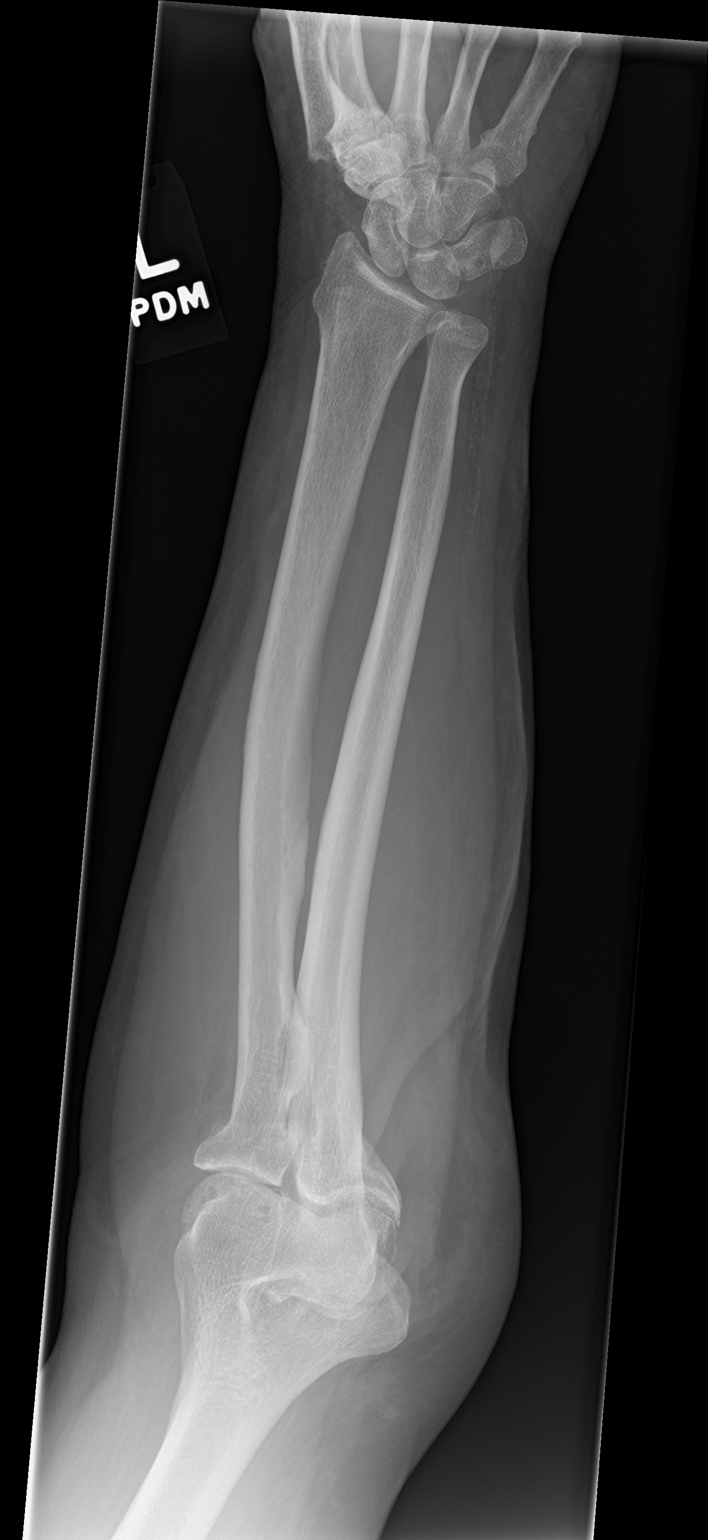

[forearm lat]
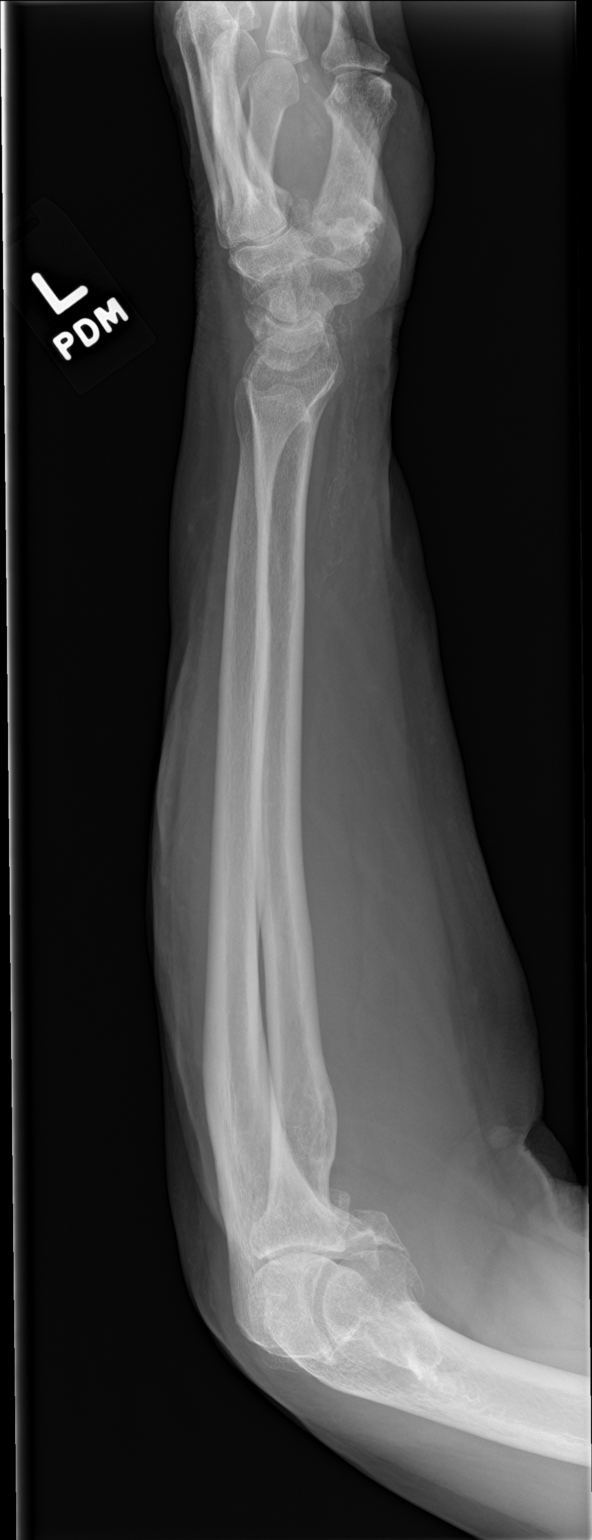

[2 of 2 positions shown; findings below may reference images not displayed]

FINDINGS: Radius and ulna appear intact with normal alignment. No acute
osseous finding or fracture. Degenerative changes of the left elbow
and wrist. No definite soft tissue abnormality. Peripheral
atherosclerosis noted.
IMPRESSION: No acute osseous finding.

## 2017-10-29 ENCOUNTER — Encounter: Payer: Self-pay | Admitting: Cardiology

## 2017-10-29 ENCOUNTER — Telehealth: Payer: Self-pay | Admitting: *Deleted

## 2017-10-29 ENCOUNTER — Ambulatory Visit (INDEPENDENT_AMBULATORY_CARE_PROVIDER_SITE_OTHER): Payer: Medicare Other | Admitting: Cardiology

## 2017-10-29 VITALS — BP 122/72 | HR 70 | Ht 66.0 in | Wt 167.0 lb

## 2017-10-29 DIAGNOSIS — G4733 Obstructive sleep apnea (adult) (pediatric): Secondary | ICD-10-CM | POA: Diagnosis not present

## 2017-10-29 DIAGNOSIS — I255 Ischemic cardiomyopathy: Secondary | ICD-10-CM

## 2017-10-29 NOTE — Progress Notes (Signed)
Cardiology Office Note:    Date:  10/29/2017   ID:  [name], DOB 04-Oct-1928, MRN [id number]  PCP:  Cassandria Anger, MD  Cardiologist:  No primary care provider on file.    Referring MD: Cassandria Anger, MD   Chief Complaint  Patient presents with  . Sleep Apnea    History of Present Illness:    [name] [name] is a 84 y.o. male with a hx of severe OSA with an AHI of 38.5/hr and is now on BiPAP. He is doing well with his PAP device and thinks that He has gotten used to it.  He tolerates the mask and feels the pressure is adequate.  He says that his full face mask has been leaking and he is getting a new one.  He gets up to use the restroom at night and so his sleep is erratic.  He does not nap during the day.   He has some dry mouth in the am.    Past Medical History:  Diagnosis Date  . AICD (automatic cardioverter/defibrillator) present   . Arrhythmia   . Arthritis    "hands, legs" (03/26/2016)  . 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 Chronic systolic CHF (congestive heart failure) ([place])    a. Echo 9/14: Mild LVH, mild focal basal septal hypertrophy, EF 35-40%, anteroseptal HK, normal diastolic function, mild MR, mild LAE, PASP 34 mmHg  . Chronotropic incompetence    treated w/pacemaker, ICD 7.2008. This was placed after CPX done post 2008 cath. CPX showed only chronotropic incompetence  . History of blood transfusion 1969   "after getting MSO4; it destroys my corpuscles" (03/26/2016)  . History of colon polyps   . Hyperlipidemia   . Hypertension   . Migraine    "just before my bypass" (03/26/2016)  . Mitral regurgitation    mild  . Myocardial infarction ([place]) "several"  . Numbness  and tingling of left arm and leg    improved after tx w/prednisone  . OSA (obstructive sleep apnea) 09/01/2016   Severe OSA with AHI 38/hr now on BiPAP at 21/17cm H2O  . Syncopal episodes    relative hypotension    Past Surgical History:  Procedure Laterality Date  . BACK SURGERY    . CARDIAC CATHETERIZATION  1980s-1990s X 4  . CARDIAC CATHETERIZATION N/A 03/26/2016   Procedure: Left Heart Cath and Cors/Grafts Angiography;  Surgeon: Burnell Blanks, MD;  Location: [facility];  Service: Cardiovascular;  Laterality: N/A;  . CARDIAC CATHETERIZATION N/A 03/26/2016   Procedure: Coronary Stent Intervention;  Surgeon: Burnell Blanks, MD;  Location: [facility];  Service: Cardiovascular;  Laterality: N/A;  . CARDIAC DEFIBRILLATOR PLACEMENT  2008  . CATARACT EXTRACTION W/ INTRAOCULAR LENS  IMPLANT, BILATERAL Bilateral 08/2013 - 09/2013   right - left   . CORONARY ANGIOPLASTY WITH STENT PLACEMENT  ~ 1992; 03/26/2016   "1 + 2"  . CORONARY ARTERY BYPASS GRAFT  1992   CABG "X4"  . CYSTOSCOPY  1980s  . GAS/FLUID EXCHANGE Right 06/18/2017   Procedure: GAS/FLUID EXCHANGE (C3F8);  Surgeon: Sherlynn Stalls, MD;  Location: [facility];  Service: Ophthalmology;  Laterality: Right;  . IMPLANTABLE CARDIOVERTER DEFIBRILLATOR (ICD) GENERATOR CHANGE N/A 06/21/2014   Procedure: ICD GENERATOR CHANGE;  Surgeon: Deboraha Sprang, MD;  Location: [facility];  Service: Cardiovascular;  Laterality: N/A;  . PARS PLANA VITRECTOMY Right 06/18/2017   Procedure: PARS PLANA VITRECTOMY WITH 25 GAUGE;  Surgeon: Sherlynn Stalls, MD;  Location: [facility];  Service: Ophthalmology;  Laterality: Right;  . PHOTOCOAGULATION WITH LASER Right 06/18/2017   Procedure: PHOTOCOAGULATION WITH LASER- ENDO LASER;  Surgeon: Sherlynn Stalls, MD;  Location: [facility];  Service: Ophthalmology;  Laterality: Right;  . POSTERIOR LUMBAR FUSION  ~ 1989   "fused 2 discs"  . TIBIA FRACTURE SURGERY Left 1943   "put artificial bone in there"     Current Medications: Current Meds  Medication Sig  . aspirin 81 MG tablet Take 1 tablet (81 mg total) by mouth daily.  Marland Kitchen atorvastatin (LIPITOR) 40 MG tablet TAKE 1 TABLET DAILY  . carvedilol (COREG) 25 MG tablet TAKE ONE-HALF (1/2) TABLET TWICE A DAY  . Cholecalciferol 1000 UNITS tablet Take 1,000 Units by mouth daily.    . clopidogrel (PLAVIX) 75 MG tablet Take 1 tablet (75 mg total) by mouth daily.  . hydrochlorothiazide (MICROZIDE) 12.5 MG capsule TAKE 1 CAPSULE DAILY  . isosorbide mononitrate (IMDUR) 30 MG 24 hr tablet TAKE ONE-HALF (1/2) TABLET DAILY  . loratadine (CLARITIN) 10 MG tablet Take 1 tablet (10 mg total) by mouth daily.  Marland Kitchen losartan (COZAAR) 100 MG tablet TAKE ONE-HALF (1/2) TABLET DAILY  . Multiple Vitamins-Minerals (PRESERVISION AREDS 2) CAPS Take 1 capsule by mouth daily.   . nitroGLYCERIN (NITROLINGUAL) 0.4 MG/SPRAY spray Place 1 spray under the tongue every 5 (five) minutes x 3 doses as needed for chest pain.      Allergies:   Morphine and Ramipril   Social History   Socioeconomic History  . Marital status: Married    Spouse name: None  . Number of children: None  . Years of education: None  . Highest education level: None  Social Needs  . Financial resource strain: None  . Food insecurity - worry: None  . Food insecurity - inability: None  . Transportation needs - medical: None  . Transportation needs - non-medical: None  Occupational History  . Occupation: Retired  Tobacco Use  . Smoking status: Former Smoker    Years: 29.00    Types: Cigars  . Smokeless tobacco: Never Used  . Tobacco comment: quit smoking cigars in 1973  Substance and Sexual Activity  . Alcohol use: Yes    Comment: "quit drinking in 1973"  . Drug use: No  . Sexual activity: Yes  Other Topics Concern  . None  Social History Narrative   Widowed - 06/2010     Family History: The patient's family history includes Colon cancer in his other; Hypertension in his father,  mother, and other.  ROS:   Please see the history of present illness.    ROS  All other systems reviewed and negative.   EKGs/Labs/Other Studies Reviewed:    The following studies were reviewed today: PAP download  EKG:  EKG is not ordered today.   Recent Labs: 12/24/2016: TSH 3.71 06/18/2017: Hemoglobin 12.1; Platelets 266 09/24/2017: ALT 17; BUN 27; Creatinine, Ser 1.21; Potassium 4.5; Sodium 138   Recent Lipid Panel    Component Value Date/Time   CHOL 101 12/24/2016 1103   CHOL 92 (L) 09/18/2016 0844   TRIG 209.0 (H) 12/24/2016 1103   HDL 27.40 (L) 12/24/2016 1103  HDL 27 (L) 09/18/2016 0844   CHOLHDL 4 12/24/2016 1103   VLDL 41.8 (H) 12/24/2016 1103   LDLCALC 32 09/18/2016 0844   LDLDIRECT 35.0 12/24/2016 1103    Physical Exam:    VS:  BP 122/72 (BP Location: Left Arm, Patient Position: Sitting, Cuff Size: Normal)   Pulse 70   Ht 5\' 6"  (1.676 m)   Wt 167 lb (75.8 kg)   SpO2 96%   BMI 26.95 kg/m     Wt Readings from Last 3 Encounters:  10/29/17 167 lb (75.8 kg)  09/24/17 170 lb (77.1 kg)  07/30/17 167 lb (75.8 kg)     GEN:  Well nourished, well developed in no acute distress HEENT: Normal NECK: No JVD; No carotid bruits LYMPHATICS: No lymphadenopathy CARDIAC: RRR, no murmurs, rubs, gallops RESPIRATORY:  Clear to auscultation without rales, wheezing or rhonchi  ABDOMEN: Soft, non-tender, non-distended MUSCULOSKELETAL:  No edema; No deformity  SKIN: Warm and dry NEUROLOGIC:  Alert and oriented x 3 PSYCHIATRIC:  Normal affect   ASSESSMENT:    1. OSA (obstructive sleep apnea)   2. Ischemic cardiomyopathy    PLAN:    In order of problems listed above:  1. OSA - the patient is tolerating PAP therapy well without any problems. The PAP download was reviewed today and showed an AHI of 0.6/hr on 21/17 cm H2O with 100% compliance in using more than 4 hours nightly.  The patient has been using and benefiting from PAP use and will continue to benefit from  therapy. Since his AHI is so low I am going to try to decrease the pressure to 20/16cm H2O and repeat download in 2 weeks.   2.  HTN - BP is well controlled on exam today.  He will continue on losartan 100mg  daily, HCTZ and carvedilol.    Medication Adjustments/Labs and Tests Ordered: Current medicines are reviewed at length with the patient today.  Concerns regarding medicines are outlined above.  No orders of the defined types were placed in this encounter.  No orders of the defined types were placed in this encounter.   Signed, Fransico Him, MD  10/29/2017 9:39 AM    [place]

## 2017-10-29 NOTE — Patient Instructions (Signed)
Medication Instructions:  Your physician recommends that you continue on your current medications as directed. Please refer to the Current Medication list given to you today.  If you need a refill on your cardiac medications, please contact your pharmacy first.  Labwork: None ordered   Testing/Procedures: None ordered   Follow-Up: Your physician wants you to follow-up in: 1 year with Dr. Turner. You will receive a reminder letter in the mail two months in advance. If you don't receive a letter, please call our office to schedule the follow-up appointment.  Any Other Special Instructions Will Be Listed Below (If Applicable).   Thank you for choosing [facility]    Rena Angelette Ganus, RN  [phone]  If you need a refill on your cardiac medications before your next appointment, please call your pharmacy.   

## 2017-10-29 NOTE — Telephone Encounter (Signed)
Order sent to  [facility] via community message to Decrease BIPAP to 20/16 cm H2O recheck D/L in 2 weeks.

## 2017-10-29 NOTE — Telephone Encounter (Signed)
-----   Message from Teressa Senter, RN sent at 10/29/2017  9:46 AM EST ----- Regarding: DME order  DME order placed   Thanks  Rena

## 2017-11-02 ENCOUNTER — Ambulatory Visit (INDEPENDENT_AMBULATORY_CARE_PROVIDER_SITE_OTHER): Payer: Medicare Other | Admitting: *Deleted

## 2017-11-02 DIAGNOSIS — I255 Ischemic cardiomyopathy: Secondary | ICD-10-CM

## 2017-11-02 DIAGNOSIS — G4733 Obstructive sleep apnea (adult) (pediatric): Secondary | ICD-10-CM | POA: Diagnosis not present

## 2017-11-03 NOTE — Progress Notes (Signed)
Remote ICD transmission.   

## 2017-11-04 LAB — CUP PACEART REMOTE DEVICE CHECK
Battery Remaining Longevity: 55 mo
Brady Statistic AP VP Percent: 4.5 %
Brady Statistic AP VS Percent: 90 %
Brady Statistic AS VP Percent: 1 %
Brady Statistic AS VS Percent: 5.1 %
Date Time Interrogation Session: 20190218070015
HighPow Impedance: 73 Ohm
HighPow Impedance: 73 Ohm
Implantable Lead Implant Date: 20080711
Implantable Lead Location: 753860
Lead Channel Impedance Value: 410 Ohm
Lead Channel Pacing Threshold Amplitude: 0.625 V
Lead Channel Pacing Threshold Pulse Width: 0.5 ms
Lead Channel Sensing Intrinsic Amplitude: 12 mV
Lead Channel Setting Pacing Amplitude: 1.625
Lead Channel Setting Sensing Sensitivity: 0.5 mV
MDC IDC LEAD IMPLANT DT: 20080711
MDC IDC LEAD LOCATION: 753859
MDC IDC MSMT BATTERY REMAINING PERCENTAGE: 64 %
MDC IDC MSMT BATTERY VOLTAGE: 2.93 V
MDC IDC MSMT LEADCHNL RA SENSING INTR AMPL: 3 mV
MDC IDC MSMT LEADCHNL RV IMPEDANCE VALUE: 390 Ohm
MDC IDC MSMT LEADCHNL RV PACING THRESHOLD AMPLITUDE: 0.75 V
MDC IDC MSMT LEADCHNL RV PACING THRESHOLD PULSEWIDTH: 0.5 ms
MDC IDC PG IMPLANT DT: 20151007
MDC IDC PG SERIAL: 7225183
MDC IDC SET LEADCHNL RV PACING AMPLITUDE: 2.5 V
MDC IDC SET LEADCHNL RV PACING PULSEWIDTH: 0.5 ms
MDC IDC STAT BRADY RA PERCENT PACED: 93 %
MDC IDC STAT BRADY RV PERCENT PACED: 4.5 %

## 2017-11-05 ENCOUNTER — Encounter: Payer: Self-pay | Admitting: Cardiology

## 2017-11-09 ENCOUNTER — Ambulatory Visit (INDEPENDENT_AMBULATORY_CARE_PROVIDER_SITE_OTHER): Payer: Medicare Other | Admitting: Cardiovascular Disease

## 2017-11-09 ENCOUNTER — Encounter: Payer: Self-pay | Admitting: Cardiovascular Disease

## 2017-11-09 VITALS — BP 98/46 | HR 74 | Ht 66.0 in | Wt 166.1 lb

## 2017-11-09 DIAGNOSIS — I2510 Atherosclerotic heart disease of native coronary artery without angina pectoris: Secondary | ICD-10-CM

## 2017-11-09 DIAGNOSIS — E785 Hyperlipidemia, unspecified: Secondary | ICD-10-CM

## 2017-11-09 DIAGNOSIS — I5022 Chronic systolic (congestive) heart failure: Secondary | ICD-10-CM

## 2017-11-09 LAB — LIPID PANEL
Chol/HDL Ratio: 2.6 ratio (ref 0.0–5.0)
Cholesterol, Total: 91 mg/dL — ABNORMAL LOW (ref 100–199)
HDL: 35 mg/dL — AB (ref >39)
LDL Calculated: 32 mg/dL (ref 0–99)
TRIGLYCERIDES: 121 mg/dL (ref 0–149)
VLDL CHOLESTEROL CAL: 24 mg/dL (ref 5–40)

## 2017-11-09 NOTE — Progress Notes (Signed)
Cardiology Office Note   Date:  11/09/2017   ID:  ELDRICK PENICK, DOB 1929/02/07, MRN [id number]  PCP:  Cassandria Anger, MD  Cardiologist:  Mertie Moores, MD   Problem list 1. Coronary artery disease-status post coronary artery bypass grafting 2. Hyperlipidemia 3. Chronic systolic congestive heart failure - EF 35-40% 4. ICD -  5.  OSA - Fransico Him, MD   Chief Complaint  Patient presents with  . Coronary Artery Disease  . Congestive Heart Failure      Notes prior to 2016  KIEON LAWHORN is a 84 y.o. male who presents today to follow-up coronary disease and ischemic cardiomyopathy and a history of hypertension. He did have an episode of syncope in the past that was related to hypotension. More recently his blood pressure has been elevated. He was hesitant to have his ramapril dose increased because he associated this with his syncopal episode in the past. Losartan was added to his medications. He is getting excellent control and I have left him on both. He feels good. There is no chest pain. He is fully active. He has an ICD in place that is followed carefully by electrophysiology.  Retired [employer] [place]   Nov. 1, 2016:  Doing well.  No CP or dyspnea. 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   January 08, 2016:  See for follow up for his CAD / CABG, chronic systolic CHF , HTN Doing well.    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   Stays active  Does not ride anymore.  Has some fatigue , no CP   Jan. 4, 2018:  Still taking care of his horses.   Feeds and hay twice  No CP , no dyspnea.   Aug. 29, 2018:  Mr. Dethloff is doing well from a cardiac standpoint  Has leg pain . 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 25, 2019:  Doing well .  Gets fatigued when he goes down to take care of the horses.  No CP or dyspnea .  Some DOE with working   Past Medical History:  Diagnosis Date  .  AICD (automatic cardioverter/defibrillator) present   . Arrhythmia   . Arthritis    "hands, legs" (03/26/2016)  . 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 Chronic systolic CHF (congestive heart failure) ([place])    a. Echo 9/14: Mild LVH, mild focal basal septal hypertrophy, EF 35-40%, anteroseptal HK, normal diastolic function, mild MR, mild LAE, PASP 34 mmHg  . Chronotropic incompetence    treated w/pacemaker, ICD 7.2008. This was placed after CPX done post 2008 cath. CPX showed only chronotropic incompetence  . History of blood transfusion 1969   "after getting MSO4; it destroys my corpuscles" (03/26/2016)  . History of colon polyps   . Hyperlipidemia   . Hypertension   . Migraine    "just before my bypass" (03/26/2016)  . Mitral regurgitation    mild  . Myocardial infarction ([place]) "several"  . Numbness and tingling of left arm  and leg    improved after tx w/prednisone  . OSA (obstructive sleep apnea) 09/01/2016   Severe OSA with AHI 38/hr now on BiPAP at 21/17cm H2O  . Syncopal episodes    relative hypotension    Past Surgical History:  Procedure Laterality Date  . BACK SURGERY    . CARDIAC CATHETERIZATION  1980s-1990s X 4  . CARDIAC CATHETERIZATION N/A 03/26/2016   Procedure: Left Heart Cath and Cors/Grafts Angiography;  Surgeon: Burnell Blanks, MD;  Location: [facility];  Service: Cardiovascular;  Laterality: N/A;  . CARDIAC CATHETERIZATION N/A 03/26/2016   Procedure: Coronary Stent Intervention;  Surgeon: Burnell Blanks, MD;  Location: [facility];  Service: Cardiovascular;  Laterality: N/A;  . CARDIAC DEFIBRILLATOR PLACEMENT  2008  . CATARACT EXTRACTION W/ INTRAOCULAR  LENS  IMPLANT, BILATERAL Bilateral 08/2013 - 09/2013   right - left   . CORONARY ANGIOPLASTY WITH STENT PLACEMENT  ~ 1992; 03/26/2016   "1 + 2"  . CORONARY ARTERY BYPASS GRAFT  1992   CABG "X4"  . CYSTOSCOPY  1980s  . GAS/FLUID EXCHANGE Right 06/18/2017   Procedure: GAS/FLUID EXCHANGE (C3F8);  Surgeon: Sherlynn Stalls, MD;  Location: [facility];  Service: Ophthalmology;  Laterality: Right;  . IMPLANTABLE CARDIOVERTER DEFIBRILLATOR (ICD) GENERATOR CHANGE N/A 06/21/2014   Procedure: ICD GENERATOR CHANGE;  Surgeon: Deboraha Sprang, MD;  Location: [facility] C[facility];  Service: Cardiovascular;  Laterality: N/A;  . PARS PLANA VITRECTOMY Right 06/18/2017   Procedure: PARS PLANA VITRECTOMY WITH 25 GAUGE;  Surgeon: Sherlynn Stalls, MD;  Location: [facility];  Service: Ophthalmology;  Laterality: Right;  . PHOTOCOAGULATION WITH LASER Right 06/18/2017   Procedure: PHOTOCOAGULATION WITH LASER- ENDO LASER;  Surgeon: Sherlynn Stalls, MD;  Location: [facility];  Service: Ophthalmology;  Laterality: Right;  . POSTERIOR LUMBAR FUSION  ~ 1989   "fused 2 discs"  . TIBIA FRACTURE SURGERY Left 1943   "put artificial bone in there"    Patient Active Problem List   Diagnosis Date Noted  . Chronic systolic CHF (congestive heart failure) ([place]) 11/18/2013    Priority: High  . CAD (coronary artery disease)     Priority: High  . Left lateral epicondylitis 07/02/2017  . Knee pain, right 12/24/2016  . OSA (obstructive sleep apnea) 09/01/2016  . Status post coronary artery stent placement   . Hypertensive heart disease with heart failure ([place])   . Unstable angina ([place])   . Thumb pain 05/04/2015  . Ejection fraction   . Well adult exam 07/13/2012  . Chronic renal insufficiency, stage III (moderate) ([place]) 07/21/2011  . Ischemic cardiomyopathy   . Hx of CABG   . Chronotropic incompetence   . Syncopal episodes   . Mitral regurgitation   . Numbness and tingling of left arm and leg   . Arm pain, diffuse, left 10/22/2009  . PARESTHESIA  10/22/2009  . TOBACCO USE, QUIT 10/22/2009  . SEBACEOUS CYST 08/27/2009  . Anemia, chronic disease 02/18/2008  . Dyslipidemia 05/03/2007  . Hypertensive heart disease 05/03/2007  . COLONIC POLYPS, HX OF 05/03/2007  . Automatic implantable cardioverter-defibrillator in situ 03/16/2007      Current Outpatient Medications  Medication Sig Dispense Refill  . aspirin 81 MG tablet Take 1 tablet (81 mg total) by mouth daily. 30 tablet 11  . atorvastatin (LIPITOR) 40 MG tablet TAKE 1 TABLET DAILY 90 tablet 3  . carvedilol (COREG) 25 MG tablet TAKE ONE-HALF (1/2) TABLET TWICE A DAY 90 tablet 2  . Cholecalciferol 1000  UNITS tablet Take 1,000 Units by mouth daily.      . clopidogrel (PLAVIX) 75 MG tablet Take 1 tablet (75 mg total) by mouth daily. 90 tablet 3  . hydrochlorothiazide (MICROZIDE) 12.5 MG capsule TAKE 1 CAPSULE DAILY 90 capsule 3  . isosorbide mononitrate (IMDUR) 30 MG 24 hr tablet TAKE ONE-HALF (1/2) TABLET DAILY 45 tablet 3  . loratadine (CLARITIN) 10 MG tablet Take 1 tablet (10 mg total) by mouth daily. 10 tablet 0  . losartan (COZAAR) 100 MG tablet TAKE ONE-HALF (1/2) TABLET DAILY 45 tablet 3  . Multiple Vitamins-Minerals (PRESERVISION AREDS 2) CAPS Take 1 capsule by mouth daily.     . nitroGLYCERIN (NITROLINGUAL) 0.4 MG/SPRAY spray Place 1 spray under the tongue every 5 (five) minutes x 3 doses as needed for chest pain.      No current facility-administered medications for this visit.     Allergies:   Morphine and Ramipril    Social History:  The patient  reports that he has quit smoking. His smoking use included cigars. He quit after 29.00 years of use. he has never used smokeless tobacco. He reports that he drinks alcohol. He reports that he does not use drugs.   Family History:  The patient's family history includes Colon cancer in his other; Hypertension in his father, mother, and other.    ROS:  Please see the history of present illness.     Patient denies fever,  chills, headache, sweats, rash, change in vision, change in hearing, chest pain, cough, nausea or vomiting, urinary symptoms. All other systems are reviewed and are negative.   Physical Exam: Blood pressure (!) 98/46, pulse 74, height 5\' 6"  (1.676 m), weight 166 lb 1.9 oz (75.4 kg), SpO2 94 %.  GEN:  Well nourished, well developed in no acute distress HEENT: Normal NECK: No JVD; No carotid bruits LYMPHATICS: No lymphadenopathy CARDIAC: RR, no murmurs, rubs, gallops RESPIRATORY:  Clear to auscultation without rales, wheezing or rhonchi  ABDOMEN: Soft, non-tender, non-distended MUSCULOSKELETAL:  No edema; No deformity  SKIN: Warm and dry NEUROLOGIC:  Alert and oriented x 3   EKG:   EKG is  done today. Aug. 29, 2018:   A paced with prolonged AV conduction.  HR of 70 .   Recent Labs: 12/24/2016: TSH 3.71 06/18/2017: Hemoglobin 12.1; Platelets 266 09/24/2017: ALT 17; BUN 27; Creatinine, Ser 1.21; Potassium 4.5; Sodium 138    Lipid Panel    Component Value Date/Time   CHOL 101 12/24/2016 1103   CHOL 92 (L) 09/18/2016 0844   TRIG 209.0 (H) 12/24/2016 1103   HDL 27.40 (L) 12/24/2016 1103   HDL 27 (L) 09/18/2016 0844   CHOLHDL 4 12/24/2016 1103   VLDL 41.8 (H) 12/24/2016 1103   LDLCALC 32 09/18/2016 0844   LDLDIRECT 35.0 12/24/2016 1103      Wt Readings from Last 3 Encounters:  11/09/17 166 lb 1.9 oz (75.4 kg)  10/29/17 167 lb (75.8 kg)  09/24/17 170 lb (77.1 kg)      Current medicines are reviewed  The patient understands his medications.   ASSESSMENT AND PLAN:  1. Coronary artery disease-status post coronary artery bypass grafting - no angina .  Has mild DOE,  Doubt this is angina   2. Hyperlipidemia -   Liver enz look good Lipids a year ago look great.   Will check today   3. Chronic systolic congestive heart failure -    Has mild DOE, no rales on exam   4.  ICD -  Followed by Dr. Caryl Comes   5. Obstructive sleep apnea - followed by Dr. Ivin Poot, MD    11/09/2017 10:38 AM    [facility],  [address] [place], [place]  [address] Pager [phone] Phone: [phone]; Fax: [phone]

## 2017-11-09 NOTE — Patient Instructions (Signed)
Medication Instructions:  Your physician recommends that you continue on your current medications as directed. Please refer to the Current Medication list given to you today.   Labwork: TODAY: LIPIDS  Testing/Procedures: None ordered  Follow-Up: Your physician wants you to follow-up in: 6 months with Dr. Acie Fredrickson. You will receive a reminder letter in the mail two months in advance. If you don't receive a letter, please call our office to schedule the follow-up appointment.   Any Other Special Instructions Will Be Listed Below (If Applicable).     If you need a refill on your cardiac medications before your next appointment, please call your pharmacy.

## 2017-11-15 ENCOUNTER — Encounter: Payer: Self-pay | Admitting: Cardiology

## 2017-11-25 ENCOUNTER — Telehealth: Payer: Self-pay | Admitting: *Deleted

## 2017-11-25 ENCOUNTER — Emergency Department (HOSPITAL_COMMUNITY)
Admission: EM | Admit: 2017-11-25 | Discharge: 2017-11-25 | Disposition: A | Discharge status: Home / Self Care | Payer: Medicare Other | Attending: Emergency Medicine | Admitting: Emergency Medicine

## 2017-11-25 ENCOUNTER — Other Ambulatory Visit: Payer: Self-pay

## 2017-11-25 ENCOUNTER — Encounter (HOSPITAL_COMMUNITY): Payer: Self-pay | Admitting: Emergency Medicine

## 2017-11-25 DIAGNOSIS — I2510 Atherosclerotic heart disease of native coronary artery without angina pectoris: Secondary | ICD-10-CM | POA: Diagnosis not present

## 2017-11-25 DIAGNOSIS — Z7902 Long term (current) use of antithrombotics/antiplatelets: Secondary | ICD-10-CM | POA: Diagnosis not present

## 2017-11-25 DIAGNOSIS — I5022 Chronic systolic (congestive) heart failure: Secondary | ICD-10-CM | POA: Diagnosis not present

## 2017-11-25 DIAGNOSIS — R509 Fever, unspecified: Secondary | ICD-10-CM | POA: Diagnosis not present

## 2017-11-25 DIAGNOSIS — I110 Hypertensive heart disease with heart failure: Secondary | ICD-10-CM | POA: Insufficient documentation

## 2017-11-25 DIAGNOSIS — Z951 Presence of aortocoronary bypass graft: Secondary | ICD-10-CM | POA: Diagnosis not present

## 2017-11-25 DIAGNOSIS — R1110 Vomiting, unspecified: Secondary | ICD-10-CM | POA: Diagnosis not present

## 2017-11-25 DIAGNOSIS — Z79899 Other long term (current) drug therapy: Secondary | ICD-10-CM | POA: Insufficient documentation

## 2017-11-25 DIAGNOSIS — Z87891 Personal history of nicotine dependence: Secondary | ICD-10-CM | POA: Diagnosis not present

## 2017-11-25 DIAGNOSIS — K529 Noninfective gastroenteritis and colitis, unspecified: Secondary | ICD-10-CM | POA: Diagnosis not present

## 2017-11-25 DIAGNOSIS — Z7982 Long term (current) use of aspirin: Secondary | ICD-10-CM | POA: Diagnosis not present

## 2017-11-25 LAB — COMPREHENSIVE METABOLIC PANEL
ALBUMIN: 4.4 g/dL (ref 3.5–5.0)
ALK PHOS: 117 U/L (ref 38–126)
ALT: 27 U/L (ref 17–63)
ANION GAP: 11 (ref 5–15)
AST: 23 U/L (ref 15–41)
BILIRUBIN TOTAL: 1.5 mg/dL — AB (ref 0.3–1.2)
BUN: 26 mg/dL — ABNORMAL HIGH (ref 6–20)
CALCIUM: 9.2 mg/dL (ref 8.9–10.3)
CO2: 22 mmol/L (ref 22–32)
Chloride: 103 mmol/L (ref 101–111)
Creatinine, Ser: 1.03 mg/dL (ref 0.61–1.24)
GFR calc non Af Amer: >60 mL/min (ref >60)
GLUCOSE: 143 mg/dL — AB (ref 65–99)
Potassium: 4.3 mmol/L (ref 3.5–5.1)
Sodium: 136 mmol/L (ref 135–145)
TOTAL PROTEIN: 7.4 g/dL (ref 6.5–8.1)

## 2017-11-25 LAB — CBC WITH DIFFERENTIAL/PLATELET
Basophils Absolute: 0 10*3/uL (ref 0.0–0.1)
Basophils Relative: 0 %
EOS PCT: 1 %
Eosinophils Absolute: 0.1 10*3/uL (ref 0.0–0.7)
HCT: 40.6 % (ref 39.0–52.0)
HEMOGLOBIN: 12.8 g/dL — AB (ref 13.0–17.0)
LYMPHS ABS: 0.7 10*3/uL (ref 0.7–4.0)
LYMPHS PCT: 6 %
MCH: 27.9 pg (ref 26.0–34.0)
MCHC: 31.5 g/dL (ref 30.0–36.0)
MCV: 88.5 fL (ref 78.0–100.0)
Monocytes Absolute: 0.5 10*3/uL (ref 0.1–1.0)
Monocytes Relative: 5 %
Neutro Abs: 9.9 10*3/uL — ABNORMAL HIGH (ref 1.7–7.7)
Neutrophils Relative %: 88 %
PLATELETS: 213 10*3/uL (ref 150–400)
RBC: 4.59 MIL/uL (ref 4.22–5.81)
RDW: 14.8 % (ref 11.5–15.5)
WBC: 11.3 10*3/uL — AB (ref 4.0–10.5)

## 2017-11-25 MED ORDER — ONDANSETRON 4 MG PO TBDP: ORAL_TABLET | ORAL | 0 refills | Status: DC

## 2017-11-25 MED ORDER — SODIUM CHLORIDE 0.9 % IV BOLUS (SEPSIS)
1000.0000 mL | Freq: Once | INTRAVENOUS | Status: AC
  Administered 2017-11-25: 1000 mL via INTRAVENOUS

## 2017-11-25 MED ORDER — ONDANSETRON HCL 4 MG/2ML IJ SOLN
4.0000 mg | Freq: Once | INTRAMUSCULAR | Status: AC
  Administered 2017-11-25: 4 mg via INTRAVENOUS
  Filled 2017-11-25: qty 2

## 2017-11-25 NOTE — Telephone Encounter (Signed)
Informed patient of compliance results and verbalized understanding was indicated. Patient understands his apnea events are good but his mask has a high leak. Patient agrees his mask is leaking.

## 2017-11-25 NOTE — ED Provider Notes (Signed)
[facility] EMERGENCY DEPARTMENT Provider Note   CSN: [id number] Arrival date & time: 11/25/17  0707     History   Chief Complaint Chief Complaint  Patient presents with  . Emesis    HPI [name] [name] is a 84 y.o. male.  Patient complains of vomiting and diarrhea.  He also complains of some fever aches.   The history is provided by the patient. No language interpreter was used.  Emesis   This is a new problem. The current episode started more than 2 days ago. The problem occurs 2 to 4 times per day. The problem has not changed since onset.The emesis has an appearance of stomach contents. The fever has been present for 1 to 2 days. Pertinent negatives include no abdominal pain, no chills, no cough, no diarrhea and no headaches.    Past Medical History:  Diagnosis Date  . AICD (automatic cardioverter/defibrillator) present   . Arrhythmia   . Arthritis    "hands, legs" (03/26/2016)  . 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 Chronic systolic CHF (congestive heart failure) ([place])    a. Echo 9/14: Mild LVH, mild focal basal septal hypertrophy, EF 35-40%, anteroseptal HK, normal diastolic function, mild MR, mild LAE, PASP 34 mmHg  . Chronotropic incompetence    treated w/pacemaker, ICD 7.2008. This was placed after CPX done post 2008 cath. CPX showed only chronotropic incompetence  . History of blood transfusion 1969   "after getting MSO4; it destroys my corpuscles" (03/26/2016)  . History of colon polyps   . Hyperlipidemia   . Hypertension   . Migraine    "just before my bypass" (03/26/2016)  . Mitral regurgitation    mild  . Myocardial infarction ([place]) "several"  . Numbness and tingling of left  arm and leg    improved after tx w/prednisone  . OSA (obstructive sleep apnea) 09/01/2016   Severe OSA with AHI 38/hr now on BiPAP at 21/17cm H2O  . Syncopal episodes    relative hypotension    Patient Active Problem List   Diagnosis Date Noted  . HLD (hyperlipidemia) 11/09/2017  . Left lateral epicondylitis 07/02/2017  . Knee pain, right 12/24/2016  . OSA (obstructive sleep apnea) 09/01/2016  . Status post coronary artery stent placement   . Hypertensive heart disease with heart failure ([place])   . Unstable angina ([place])   . Thumb pain 05/04/2015  . Chronic systolic CHF (congestive heart failure) ([place]) 11/18/2013  . Ejection fraction   . CAD (coronary artery disease)   . Well adult exam 07/13/2012  . Chronic renal insufficiency, stage III (moderate) ([place]) 07/21/2011  . Ischemic cardiomyopathy   . Hx of CABG   . Chronotropic incompetence   . Syncopal episodes   . Mitral regurgitation   . Numbness and tingling of left arm and leg   . Arm pain, diffuse, left 10/22/2009  . PARESTHESIA 10/22/2009  . TOBACCO USE, QUIT 10/22/2009  . SEBACEOUS CYST 08/27/2009  . Anemia, chronic disease 02/18/2008  . Dyslipidemia 05/03/2007  . Hypertensive heart disease 05/03/2007  . COLONIC POLYPS, HX OF 05/03/2007  . Automatic implantable cardioverter-defibrillator in situ 03/16/2007    Past Surgical History:  Procedure Laterality Date  . BACK SURGERY    . CARDIAC CATHETERIZATION  1980s-1990s X 4  . CARDIAC CATHETERIZATION N/A 03/26/2016   Procedure: Left Heart Cath and Cors/Grafts Angiography;  Surgeon: Burnell Blanks, MD;  Location: [facility];  Service: Cardiovascular;  Laterality: N/A;  . CARDIAC CATHETERIZATION N/A 03/26/2016   Procedure: Coronary Stent Intervention;  Surgeon: Burnell Blanks, MD;  Location: [facility];  Service: Cardiovascular;  Laterality: N/A;  . CARDIAC DEFIBRILLATOR PLACEMENT  2008  . CATARACT EXTRACTION W/ INTRAOCULAR LENS  IMPLANT,  BILATERAL Bilateral 08/2013 - 09/2013   right - left   . CORONARY ANGIOPLASTY WITH STENT PLACEMENT  ~ 1992; 03/26/2016   "1 + 2"  . CORONARY ARTERY BYPASS GRAFT  1992   CABG "X4"  . CYSTOSCOPY  1980s  . GAS/FLUID EXCHANGE Right 06/18/2017   Procedure: GAS/FLUID EXCHANGE (C3F8);  Surgeon: Sherlynn Stalls, MD;  Location: [facility];  Service: Ophthalmology;  Laterality: Right;  . IMPLANTABLE CARDIOVERTER DEFIBRILLATOR (ICD) GENERATOR CHANGE N/A 06/21/2014   Procedure: ICD GENERATOR CHANGE;  Surgeon: Deboraha Sprang, MD;  Location: [facility];  Service: Cardiovascular;  Laterality: N/A;  . PARS PLANA VITRECTOMY Right 06/18/2017   Procedure: PARS PLANA VITRECTOMY WITH 25 GAUGE;  Surgeon: Sherlynn Stalls, MD;  Location: [facility];  Service: Ophthalmology;  Laterality: Right;  . PHOTOCOAGULATION WITH LASER Right 06/18/2017   Procedure: PHOTOCOAGULATION WITH LASER- ENDO LASER;  Surgeon: Sherlynn Stalls, MD;  Location: [facility];  Service: Ophthalmology;  Laterality: Right;  . POSTERIOR LUMBAR FUSION  ~ 1989   "fused 2 discs"  . TIBIA FRACTURE SURGERY Left 1943   "put artificial bone in there"       Home Medications    Prior to Admission medications   Medication Sig Start Date End Date Taking? Authorizing Provider  aspirin 81 MG tablet Take 1 tablet (81 mg total) by mouth daily. 07/22/12  Yes Deboraha Sprang, MD  atorvastatin (LIPITOR) 40 MG tablet Take 20 mg by mouth 2 (two) times daily.   Yes [provider]  carvedilol (COREG) 25 MG tablet TAKE ONE-HALF (1/2) TABLET TWICE A DAY 08/10/17  Yes Deboraha Sprang, MD  Cholecalciferol 1000 UNITS tablet Take 1,000 Units by mouth daily.     Yes [provider]  clopidogrel (PLAVIX) 75 MG tablet Take 1 tablet (75 mg total) by mouth daily. 07/13/17  Yes Burnell Blanks, MD  hydrochlorothiazide (MICROZIDE) 12.5 MG capsule TAKE 1 CAPSULE DAILY 07/03/17  Yes Deboraha Sprang, MD  isosorbide mononitrate (IMDUR) 30 MG 24 hr tablet TAKE ONE-HALF (1/2)  TABLET DAILY 03/03/17  Yes Almyra Deforest, PA  loratadine (CLARITIN) 10 MG tablet Take 1 tablet (10 mg total) by mouth daily. 05/03/16  Yes Lily Kocher, PA-C  losartan (COZAAR) 100 MG tablet TAKE ONE-HALF (1/2) TABLET DAILY 07/13/17  Yes Deboraha Sprang, MD  Multiple Vitamins-Minerals (PRESERVISION AREDS 2) CAPS Take 1 capsule by mouth daily.    Yes [provider]  nitroGLYCERIN (NITROLINGUAL) 0.4 MG/SPRAY spray Place 1 spray under the tongue every 5 (five) minutes x 3 doses as needed for chest pain.    Yes [provider]  ondansetron (ZOFRAN ODT) 4 MG disintegrating tablet 4mg  ODT q4 hours prn nausea/vomit 11/25/17   Milton Ferguson, MD    Family History Family History  Problem Relation Age of Onset  . Hypertension Mother   . Hypertension Father   . Hypertension Other   . Colon cancer Other  1st degree relative <50    Social History Social History   Tobacco Use  . Smoking status: Former Smoker    Years: 29.00    Types: Cigars  . Smokeless tobacco: Never Used  . Tobacco comment: quit smoking cigars in 1973  Substance Use Topics  . Alcohol use: Yes    Comment: "quit drinking in 1973"  . Drug use: No     Allergies   Morphine and Ramipril   Review of Systems Review of Systems  Constitutional: Negative for appetite change, chills and fatigue.  HENT: Negative for congestion, ear discharge and sinus pressure.   Eyes: Negative for discharge.  Respiratory: Negative for cough.   Cardiovascular: Negative for chest pain.  Gastrointestinal: Positive for vomiting. Negative for abdominal pain and diarrhea.  Genitourinary: Negative for frequency and hematuria.  Musculoskeletal: Negative for back pain.  Skin: Negative for rash.  Neurological: Negative for seizures and headaches.  Psychiatric/Behavioral: Negative for hallucinations.     Physical Exam Updated Vital Signs BP (!) 110/57   Pulse 72   Temp 97.9 F (36.6 C) (Oral)   Resp 15   Ht 5\' 6"  (1.676  m)   Wt 74.8 kg (165 lb)   SpO2 94%   BMI 26.63 kg/m   Physical Exam  Constitutional: He is oriented to person, place, and time. He appears well-developed.  HENT:  Head: Normocephalic.  Mild dry mucous membranes  Eyes: Conjunctivae and EOM are normal. No scleral icterus.  Neck: Neck supple. No thyromegaly present.  Cardiovascular: Normal rate and regular rhythm. Exam reveals no gallop and no friction rub.  No murmur heard. Pulmonary/Chest: No stridor. He has no wheezes. He has no rales. He exhibits no tenderness.  Abdominal: He exhibits no distension. There is no tenderness. There is no rebound.  Musculoskeletal: Normal range of motion. He exhibits no edema.  Lymphadenopathy:    He has no cervical adenopathy.  Neurological: He is oriented to person, place, and time. He exhibits normal muscle tone. Coordination normal.  Skin: No rash noted. No erythema.  Psychiatric: He has a normal mood and affect. His behavior is normal.     ED Treatments / Results  Labs (all labs ordered are listed, but only abnormal results are displayed) Labs Reviewed  CBC WITH DIFFERENTIAL/PLATELET - Abnormal; Notable for the following components:      Result Value   WBC 11.3 (*)    Hemoglobin 12.8 (*)    Neutro Abs 9.9 (*)    All other components within normal limits  COMPREHENSIVE METABOLIC PANEL - Abnormal; Notable for the following components:   Glucose, Bld 143 (*)    BUN 26 (*)    Total Bilirubin 1.5 (*)    All other components within normal limits    EKG  EKG Interpretation None       Radiology No results found.  Procedures Procedures (including critical care time)  Medications Ordered in ED Medications  ondansetron (ZOFRAN) injection 4 mg (4 mg Intravenous Given 11/25/17 0808)  sodium chloride 0.9 % bolus 1,000 mL (0 mLs Intravenous Stopped 11/25/17 0947)     Initial Impression / Assessment and Plan / ED Course  I have reviewed the triage vital signs and the nursing  notes.  Pertinent labs & imaging results that were available during my care of the patient were reviewed by me and considered in my medical decision making (see chart for details). Patient with gastroenteritis.  Patient improved with fluids and nausea medicine.  He will be  sent home with Zofran.      Final Clinical Impressions(s) / ED Diagnoses   Final diagnoses:  Gastroenteritis    ED Discharge Orders        Ordered    ondansetron (ZOFRAN ODT) 4 MG disintegrating tablet     11/25/17 1120       Milton Ferguson, MD 11/25/17 1124

## 2017-11-25 NOTE — ED Triage Notes (Signed)
Pt reports sudden onset emesis and diarrhea since 2am this am.

## 2017-11-25 NOTE — Telephone Encounter (Signed)
-----   Message from Sueanne Margarita, MD sent at 11/23/2017  4:44 PM EDT ----- AHI looks good but high mask lead- please find out if mask is leaking per patient

## 2017-11-25 NOTE — Discharge Instructions (Signed)
Drink plenty of fluids.  Take some Imodium for diarrhea.  Follow-up with your family doctor next week for recheck.  Tylenol or Motrin for aches and fever

## 2017-11-26 NOTE — Telephone Encounter (Signed)
Patient called back today to say he started with a small mask but it was too small.  Then he went to a medium mask that seemed to work but his wife says she can hear it whistling at night when he is sleeping. Patient says he will try to tighten his mask a little more to see if that helps.

## 2017-12-29 ENCOUNTER — Encounter: Payer: Self-pay | Admitting: Internal Medicine

## 2017-12-29 ENCOUNTER — Ambulatory Visit (INDEPENDENT_AMBULATORY_CARE_PROVIDER_SITE_OTHER)
Admission: RE | Admit: 2017-12-29 | Discharge: 2017-12-29 | Disposition: A | Discharge status: Home / Self Care | Payer: Medicare Other | Source: Ambulatory Visit | Attending: Internal Medicine | Admitting: Internal Medicine

## 2017-12-29 ENCOUNTER — Ambulatory Visit (INDEPENDENT_AMBULATORY_CARE_PROVIDER_SITE_OTHER): Payer: Medicare Other | Admitting: Internal Medicine

## 2017-12-29 DIAGNOSIS — J309 Allergic rhinitis, unspecified: Secondary | ICD-10-CM | POA: Insufficient documentation

## 2017-12-29 DIAGNOSIS — J301 Allergic rhinitis due to pollen: Secondary | ICD-10-CM | POA: Diagnosis not present

## 2017-12-29 DIAGNOSIS — R05 Cough: Secondary | ICD-10-CM

## 2017-12-29 DIAGNOSIS — R059 Cough, unspecified: Secondary | ICD-10-CM

## 2017-12-29 DIAGNOSIS — I5022 Chronic systolic (congestive) heart failure: Secondary | ICD-10-CM | POA: Diagnosis not present

## 2017-12-29 DIAGNOSIS — I2510 Atherosclerotic heart disease of native coronary artery without angina pectoris: Secondary | ICD-10-CM

## 2017-12-29 DIAGNOSIS — I110 Hypertensive heart disease with heart failure: Secondary | ICD-10-CM | POA: Diagnosis not present

## 2017-12-29 MED ORDER — METHYLPREDNISOLONE ACETATE 80 MG/ML IJ SUSP
80.0000 mg | Freq: Once | INTRAMUSCULAR | Status: AC
  Administered 2017-12-29: 80 mg via INTRAMUSCULAR

## 2017-12-29 MED ORDER — LORATADINE 10 MG PO TABS: 10.0000 mg | ORAL_TABLET | Freq: Every day | ORAL | 3 refills | Status: DC

## 2017-12-29 MED ORDER — PROMETHAZINE-CODEINE 6.25-10 MG/5ML PO SYRP: 5.0000 mL | ORAL_SOLUTION | ORAL | 0 refills | Status: DC | PRN

## 2017-12-29 NOTE — Assessment & Plan Note (Signed)
Imdur, Lipitor, Coreg, ASA No CP

## 2017-12-29 NOTE — Assessment & Plan Note (Signed)
CXR

## 2017-12-29 NOTE — Assessment & Plan Note (Signed)
CXR On meds

## 2017-12-29 NOTE — Progress Notes (Signed)
Subjective:  Patient ID: [name] [name], male    DOB: 06-25-1929  Age: 84 y.o. MRN: [id number]  CC: No chief complaint on file.   HPI BRIAN ZEITLIN presents for allergies and cough x 1 month F/u HTN, CAD  Outpatient Medications Prior to Visit  Medication Sig Dispense Refill  . aspirin 81 MG tablet Take 1 tablet (81 mg total) by mouth daily. 30 tablet 11  . atorvastatin (LIPITOR) 40 MG tablet Take 20 mg by mouth 2 (two) times daily.    . carvedilol (COREG) 25 MG tablet TAKE ONE-HALF (1/2) TABLET TWICE A DAY 90 tablet 2  . Cholecalciferol 1000 UNITS tablet Take 1,000 Units by mouth daily.      . clopidogrel (PLAVIX) 75 MG tablet Take 1 tablet (75 mg total) by mouth daily. 90 tablet 3  . hydrochlorothiazide (MICROZIDE) 12.5 MG capsule TAKE 1 CAPSULE DAILY 90 capsule 3  . isosorbide mononitrate (IMDUR) 30 MG 24 hr tablet TAKE ONE-HALF (1/2) TABLET DAILY 45 tablet 3  . loratadine (CLARITIN) 10 MG tablet Take 1 tablet (10 mg total) by mouth daily. 10 tablet 0  . losartan (COZAAR) 100 MG tablet TAKE ONE-HALF (1/2) TABLET DAILY 45 tablet 3  . Multiple Vitamins-Minerals (PRESERVISION AREDS 2) CAPS Take 1 capsule by mouth daily.     . nitroGLYCERIN (NITROLINGUAL) 0.4 MG/SPRAY spray Place 1 spray under the tongue every 5 (five) minutes x 3 doses as needed for chest pain.     Marland Kitchen ondansetron (ZOFRAN ODT) 4 MG disintegrating tablet 4mg  ODT q4 hours prn nausea/vomit 12 tablet 0   No facility-administered medications prior to visit.     ROS Review of Systems  Constitutional: Negative for appetite change, fatigue, fever and unexpected weight change.  HENT: Positive for congestion, postnasal drip and rhinorrhea. Negative for nosebleeds, sneezing, sore throat and trouble swallowing.   Eyes: Negative for itching and visual disturbance.  Respiratory: Positive for cough.   Cardiovascular: Negative for chest pain, palpitations and leg swelling.  Gastrointestinal: Negative for abdominal distention, blood  in stool, diarrhea and nausea.  Genitourinary: Negative for frequency and hematuria.  Musculoskeletal: Negative for back pain, gait problem, joint swelling and neck pain.  Skin: Negative for rash.  Neurological: Negative for dizziness, tremors, speech difficulty and weakness.  Psychiatric/Behavioral: Negative for agitation, dysphoric mood and sleep disturbance. The patient is not nervous/anxious.     Objective:  BP 126/64 (BP Location: Left Arm, Patient Position: Sitting, Cuff Size: Large)   Pulse 78   Temp 97.7 F (36.5 C) (Oral)   Ht 5\' 6"  (1.676 m)   Wt 165 lb (74.8 kg)   SpO2 99%   BMI 26.63 kg/m   BP Readings from Last 3 Encounters:  12/29/17 126/64  11/25/17 (!) 119/55  11/09/17 (!) 98/46    Wt Readings from Last 3 Encounters:  12/29/17 165 lb (74.8 kg)  11/25/17 165 lb (74.8 kg)  11/09/17 166 lb 1.9 oz (75.4 kg)    Physical Exam  Constitutional: He is oriented to person, place, and time. He appears well-developed. No distress.  NAD  HENT:  Mouth/Throat: Oropharynx is clear and moist.  Eyes: Pupils are equal, round, and reactive to light. Conjunctivae are normal.  Neck: Normal range of motion. No JVD present. No thyromegaly present.  Cardiovascular: Normal rate, regular rhythm, normal heart sounds and intact distal pulses. Exam reveals no gallop and no friction rub.  No murmur heard. Pulmonary/Chest: Effort normal and breath sounds normal. No respiratory distress. He has  no wheezes. He has no rales. He exhibits no tenderness.  Abdominal: Soft. Bowel sounds are normal. He exhibits no distension and no mass. There is no tenderness. There is no rebound and no guarding.  Musculoskeletal: Normal range of motion. He exhibits no edema or tenderness.  Lymphadenopathy:    He has no cervical adenopathy.  Neurological: He is alert and oriented to person, place, and time. He has normal reflexes. No cranial nerve deficit. He exhibits normal muscle tone. He displays a negative  Romberg sign. Coordination and gait normal.  Skin: Skin is warm and dry. No rash noted.  Psychiatric: He has a normal mood and affect. His behavior is normal. Judgment and thought content normal.  swollen nasal mucosa  Lab Results  Component Value Date   WBC 11.3 (H) 11/25/2017   HGB 12.8 (L) 11/25/2017   HCT 40.6 11/25/2017   PLT 213 11/25/2017   GLUCOSE 143 (H) 11/25/2017   CHOL 91 (L) 11/09/2017   TRIG 121 11/09/2017   HDL 35 (L) 11/09/2017   LDLDIRECT 35.0 12/24/2016   LDLCALC 32 11/09/2017   ALT 27 11/25/2017   AST 23 11/25/2017   NA 136 11/25/2017   K 4.3 11/25/2017   CL 103 11/25/2017   CREATININE 1.03 11/25/2017   BUN 26 (H) 11/25/2017   CO2 22 11/25/2017   TSH 3.71 12/24/2016   PSA 3.11 12/24/2016   INR 1.0 03/25/2016   HGBA1C 6.4 12/24/2016    No results found.  Assessment & Plan:   There are no diagnoses linked to this encounter. I have discontinued Shanon Brow B. Shed's ondansetron. I am also having him maintain his nitroGLYCERIN, Cholecalciferol, aspirin, PRESERVISION AREDS 2, loratadine, isosorbide mononitrate, hydrochlorothiazide, losartan, clopidogrel, carvedilol, and atorvastatin.  No orders of the defined types were placed in this encounter.    Follow-up: No follow-ups on file.  Walker Kehr, MD

## 2017-12-29 NOTE — Assessment & Plan Note (Signed)
Depo-medrol 80 mg Claritin

## 2017-12-29 NOTE — Addendum Note (Signed)
Addended by: Karren Cobble on: 12/29/2017 02:21 PM   Modules accepted: Orders

## 2017-12-29 NOTE — Assessment & Plan Note (Signed)
x 3-4 weeks ?etiology CXR Depo-medrol 80 mg

## 2018-02-01 ENCOUNTER — Ambulatory Visit (INDEPENDENT_AMBULATORY_CARE_PROVIDER_SITE_OTHER): Payer: Medicare Other | Admitting: *Deleted

## 2018-02-01 DIAGNOSIS — I255 Ischemic cardiomyopathy: Secondary | ICD-10-CM

## 2018-02-01 DIAGNOSIS — G4733 Obstructive sleep apnea (adult) (pediatric): Secondary | ICD-10-CM | POA: Diagnosis not present

## 2018-02-01 NOTE — Progress Notes (Signed)
Remote ICD transmission.   

## 2018-02-02 ENCOUNTER — Encounter: Payer: Self-pay | Admitting: Internal Medicine

## 2018-02-02 ENCOUNTER — Encounter: Payer: Self-pay | Admitting: Cardiology

## 2018-02-02 ENCOUNTER — Ambulatory Visit (INDEPENDENT_AMBULATORY_CARE_PROVIDER_SITE_OTHER): Payer: Medicare Other | Admitting: Internal Medicine

## 2018-02-02 DIAGNOSIS — E785 Hyperlipidemia, unspecified: Secondary | ICD-10-CM

## 2018-02-02 DIAGNOSIS — I2510 Atherosclerotic heart disease of native coronary artery without angina pectoris: Secondary | ICD-10-CM | POA: Diagnosis not present

## 2018-02-02 DIAGNOSIS — I8393 Asymptomatic varicose veins of bilateral lower extremities: Secondary | ICD-10-CM | POA: Diagnosis not present

## 2018-02-02 LAB — CUP PACEART REMOTE DEVICE CHECK
Brady Statistic AP VP Percent: 4.4 %
Brady Statistic AP VS Percent: 90 %
Brady Statistic AS VS Percent: 5.6 %
Brady Statistic RA Percent Paced: 93 %
Brady Statistic RV Percent Paced: 4.4 %
HIGH POWER IMPEDANCE MEASURED VALUE: 77 Ohm
HighPow Impedance: 77 Ohm
Implantable Lead Implant Date: 20080711
Implantable Lead Location: 753859
Implantable Lead Model: 7122
Implantable Pulse Generator Implant Date: 20151007
Lead Channel Pacing Threshold Amplitude: 0.75 V
Lead Channel Pacing Threshold Amplitude: 0.75 V
Lead Channel Pacing Threshold Pulse Width: 0.5 ms
Lead Channel Sensing Intrinsic Amplitude: 2.8 mV
Lead Channel Setting Pacing Amplitude: 2.5 V
MDC IDC LEAD IMPLANT DT: 20080711
MDC IDC LEAD LOCATION: 753860
MDC IDC MSMT BATTERY REMAINING LONGEVITY: 52 mo
MDC IDC MSMT BATTERY REMAINING PERCENTAGE: 61 %
MDC IDC MSMT BATTERY VOLTAGE: 2.93 V
MDC IDC MSMT LEADCHNL RA IMPEDANCE VALUE: 380 Ohm
MDC IDC MSMT LEADCHNL RA PACING THRESHOLD PULSEWIDTH: 0.5 ms
MDC IDC MSMT LEADCHNL RV IMPEDANCE VALUE: 380 Ohm
MDC IDC MSMT LEADCHNL RV SENSING INTR AMPL: 12 mV
MDC IDC SESS DTM: 20190520070029
MDC IDC SET LEADCHNL RA PACING AMPLITUDE: 1.75 V
MDC IDC SET LEADCHNL RV PACING PULSEWIDTH: 0.5 ms
MDC IDC SET LEADCHNL RV SENSING SENSITIVITY: 0.5 mV
MDC IDC STAT BRADY AS VP PERCENT: 1 %
Pulse Gen Serial Number: 7225183

## 2018-02-02 NOTE — Patient Instructions (Signed)
Elastic knee support (tube like)    Varicose Veins Varicose veins are veins that have become enlarged and twisted. They are usually seen in the legs but can occur in other parts of the body as well. What are the causes? This condition is the result of valves in the veins not working properly. Valves in the veins help to return blood from the leg to the heart. If these valves are damaged, blood flows backward and backs up into the veins in the leg near the skin. This causes the veins to become larger. What increases the risk? People who are on their feet a lot, who are pregnant, or who are overweight are more likely to develop varicose veins. What are the signs or symptoms?  Bulging, twisted-appearing, bluish veins, most commonly found on the legs.  Leg pain or a feeling of heaviness. These symptoms may be worse at the end of the day.  Leg swelling.  Changes in skin color. How is this diagnosed? A health care provider can usually diagnose varicose veins by examining your legs. Your health care provider may also recommend an ultrasound of your leg veins. How is this treated? Most varicose veins can be treated at home.However, other treatments are available for people who have persistent symptoms or want to improve the cosmetic appearance of the varicose veins. These treatment options include:  Sclerotherapy. A solution is injected into the vein to close it off.  Laser treatment. A laser is used to heat the vein to close it off.  Radiofrequency vein ablation. An electrical current produced by radio waves is used to close off the vein.  Phlebectomy. The vein is surgically removed through small incisions made over the varicose vein.  Vein ligation and stripping. The vein is surgically removed through incisions made over the varicose vein after the vein has been tied (ligated).  Follow these instructions at home:  Do not stand or sit in one position for long periods of time. Do not sit  with your legs crossed. Rest with your legs raised during the day.  Wear compression stockings as directed by your health care provider. These stockings help to prevent blood clots and reduce swelling in your legs.  Do not wear other tight, encircling garments around your legs, pelvis, or waist.  Walk as much as possible to increase blood flow.  Raise the foot of your bed at night with 2-inch blocks.  If you get a cut in the skin over the vein and the vein bleeds, lie down with your leg raised and press on it with a clean cloth until the bleeding stops. Then place a bandage (dressing) on the cut. See your health care provider if it continues to bleed. Contact a health care provider if:  The skin around your ankle starts to break down.  You have pain, redness, tenderness, or hard swelling in your leg over a vein.  You are uncomfortable because of leg pain. This information is not intended to replace advice given to you by your health care provider. Make sure you discuss any questions you have with your health care provider. Document Released: 06/11/2005 Document Revised: 02/07/2016 Document Reviewed: 03/04/2016 Elsevier Interactive Patient Education  2017 [employer].

## 2018-02-02 NOTE — Progress Notes (Signed)
Letter  

## 2018-02-02 NOTE — Progress Notes (Signed)
Subjective:  Patient ID: [name] [name], male    DOB: 30-Jan-1929  Age: 84 y.o. MRN: [id number]  CC: No chief complaint on file.   HPI [name] [name] presents for lumps on the knees xlong time. They burn when walking a lot.. F/u CAD, dyslipidemia  Outpatient Medications Prior to Visit  Medication Sig Dispense Refill  . aspirin 81 MG tablet Take 1 tablet (81 mg total) by mouth daily. 30 tablet 11  . atorvastatin (LIPITOR) 40 MG tablet Take 20 mg by mouth 2 (two) times daily.    . carvedilol (COREG) 25 MG tablet TAKE ONE-HALF (1/2) TABLET TWICE A DAY 90 tablet 2  . Cholecalciferol 1000 UNITS tablet Take 1,000 Units by mouth daily.      . clopidogrel (PLAVIX) 75 MG tablet Take 1 tablet (75 mg total) by mouth daily. 90 tablet 3  . hydrochlorothiazide (MICROZIDE) 12.5 MG capsule TAKE 1 CAPSULE DAILY 90 capsule 3  . isosorbide mononitrate (IMDUR) 30 MG 24 hr tablet TAKE ONE-HALF (1/2) TABLET DAILY 45 tablet 3  . loratadine (CLARITIN) 10 MG tablet Take 1 tablet (10 mg total) by mouth daily. 100 tablet 3  . losartan (COZAAR) 100 MG tablet TAKE ONE-HALF (1/2) TABLET DAILY 45 tablet 3  . Multiple Vitamins-Minerals (PRESERVISION AREDS 2) CAPS Take 1 capsule by mouth daily.     . nitroGLYCERIN (NITROLINGUAL) 0.4 MG/SPRAY spray Place 1 spray under the tongue every 5 (five) minutes x 3 doses as needed for chest pain.     . promethazine-codeine (PHENERGAN WITH CODEINE) 6.25-10 MG/5ML syrup Take 5 mLs by mouth every 4 (four) hours as needed. 300 mL 0   No facility-administered medications prior to visit.     ROS Review of Systems  Constitutional: Negative for appetite change, fatigue and unexpected weight change.  HENT: Negative for congestion, nosebleeds, sneezing, sore throat and trouble swallowing.   Eyes: Negative for itching and visual disturbance.  Respiratory: Negative for cough.   Cardiovascular: Negative for chest pain, palpitations and leg swelling.  Gastrointestinal: Negative for  abdominal distention, blood in stool, diarrhea and nausea.  Genitourinary: Negative for frequency and hematuria.  Musculoskeletal: Positive for arthralgias. Negative for back pain, gait problem, joint swelling and neck pain.  Skin: Negative for rash.  Neurological: Negative for dizziness, tremors, speech difficulty and weakness.  Psychiatric/Behavioral: Negative for agitation, dysphoric mood, sleep disturbance and suicidal ideas. The patient is not nervous/anxious.     Objective:  BP 128/66 (BP Location: Left Arm, Patient Position: Sitting, Cuff Size: Normal)   Pulse 90   Temp (!) 97.5 F (36.4 C) (Oral)   Ht 5\' 6"  (1.676 m)   Wt 164 lb (74.4 kg)   SpO2 98%   BMI 26.47 kg/m   BP Readings from Last 3 Encounters:  02/02/18 128/66  12/29/17 126/64  11/25/17 (!) 119/55    Wt Readings from Last 3 Encounters:  02/02/18 164 lb (74.4 kg)  12/29/17 165 lb (74.8 kg)  11/25/17 165 lb (74.8 kg)    Physical Exam  Constitutional: He is oriented to person, place, and time. He appears well-developed. No distress.  NAD  HENT:  Mouth/Throat: Oropharynx is clear and moist.  Eyes: Pupils are equal, round, and reactive to light. Conjunctivae are normal.  Neck: Normal range of motion. No JVD present. No thyromegaly present.  Cardiovascular: Normal rate, regular rhythm, normal heart sounds and intact distal pulses. Exam reveals no gallop and no friction rub.  No murmur heard. Pulmonary/Chest: Effort normal and breath  sounds normal. No respiratory distress. He has no wheezes. He has no rales. He exhibits no tenderness.  Abdominal: Soft. Bowel sounds are normal. He exhibits no distension and no mass. There is no tenderness. There is no rebound and no guarding.  Musculoskeletal: Normal range of motion. He exhibits no edema or tenderness.  Lymphadenopathy:    He has no cervical adenopathy.  Neurological: He is alert and oriented to person, place, and time. He has normal reflexes. No cranial nerve  deficit. He exhibits normal muscle tone. He displays a negative Romberg sign. Coordination and gait normal.  Skin: Skin is warm and dry. No rash noted.  Psychiatric: He has a normal mood and affect. His behavior is normal. Judgment and thought content normal.      knots ov sq varic veins L>>R around knees, NT  Lab Results  Component Value Date   WBC 11.3 (H) 11/25/2017   HGB 12.8 (L) 11/25/2017   HCT 40.6 11/25/2017   PLT 213 11/25/2017   GLUCOSE 143 (H) 11/25/2017   CHOL 91 (L) 11/09/2017   TRIG 121 11/09/2017   HDL 35 (L) 11/09/2017   LDLDIRECT 35.0 12/24/2016   LDLCALC 32 11/09/2017   ALT 27 11/25/2017   AST 23 11/25/2017   NA 136 11/25/2017   K 4.3 11/25/2017   CL 103 11/25/2017   CREATININE 1.03 11/25/2017   BUN 26 (H) 11/25/2017   CO2 22 11/25/2017   TSH 3.71 12/24/2016   PSA 3.11 12/24/2016   INR 1.0 03/25/2016   HGBA1C 6.4 12/24/2016    Dg Chest 2 View  Result Date: 12/29/2017 CLINICAL DATA:  Cough for the past month. EXAM: CHEST - 2 VIEW COMPARISON:  Chest x-ray dated September 16, 2014. FINDINGS: Unchanged right chest wall AICD. Stable borderline cardiomegaly status post CABG. Normal pulmonary vascularity. No focal consolidation, pleural effusion, or pneumothorax. No acute osseous abnormality. IMPRESSION: No active cardiopulmonary disease. Electronically Signed   By: Titus Dubin M.D.   On: 12/29/2017 15:43    Assessment & Plan:   There are no diagnoses linked to this encounter. I am having [name] [name] maintain his nitroGLYCERIN, Cholecalciferol, aspirin, PRESERVISION AREDS 2, isosorbide mononitrate, hydrochlorothiazide, losartan, clopidogrel, carvedilol, atorvastatin, loratadine, and promethazine-codeine.  No orders of the defined types were placed in this encounter.    Follow-up: No follow-ups on file.  Walker Kehr, MD

## 2018-02-02 NOTE — Assessment & Plan Note (Signed)
Imdur, Lipitor, Coreg, ASA, Plavix No angina

## 2018-02-02 NOTE — Assessment & Plan Note (Signed)
Lipitor 

## 2018-02-02 NOTE — Assessment & Plan Note (Signed)
L>>R around knees Elastic brace w/walking Other options discussed

## 2018-03-09 ENCOUNTER — Other Ambulatory Visit: Payer: Self-pay | Admitting: *Deleted

## 2018-03-09 MED ORDER — ISOSORBIDE MONONITRATE ER 30 MG PO TB24: 15.0000 mg | ORAL_TABLET | Freq: Every day | ORAL | 3 refills | Status: DC

## 2018-04-02 ENCOUNTER — Encounter: Payer: Self-pay | Admitting: Internal Medicine

## 2018-04-02 ENCOUNTER — Ambulatory Visit (INDEPENDENT_AMBULATORY_CARE_PROVIDER_SITE_OTHER): Payer: Medicare Other | Admitting: Internal Medicine

## 2018-04-02 ENCOUNTER — Other Ambulatory Visit (INDEPENDENT_AMBULATORY_CARE_PROVIDER_SITE_OTHER): Payer: Medicare Other

## 2018-04-02 DIAGNOSIS — I110 Hypertensive heart disease with heart failure: Secondary | ICD-10-CM | POA: Diagnosis not present

## 2018-04-02 DIAGNOSIS — N183 Chronic kidney disease, stage 3 unspecified: Secondary | ICD-10-CM

## 2018-04-02 DIAGNOSIS — I255 Ischemic cardiomyopathy: Secondary | ICD-10-CM

## 2018-04-02 DIAGNOSIS — I5020 Unspecified systolic (congestive) heart failure: Secondary | ICD-10-CM | POA: Diagnosis not present

## 2018-04-02 DIAGNOSIS — I2510 Atherosclerotic heart disease of native coronary artery without angina pectoris: Secondary | ICD-10-CM | POA: Diagnosis not present

## 2018-04-02 DIAGNOSIS — E785 Hyperlipidemia, unspecified: Secondary | ICD-10-CM | POA: Diagnosis not present

## 2018-04-02 DIAGNOSIS — N2889 Other specified disorders of kidney and ureter: Secondary | ICD-10-CM

## 2018-04-02 DIAGNOSIS — I5022 Chronic systolic (congestive) heart failure: Secondary | ICD-10-CM | POA: Diagnosis not present

## 2018-04-02 DIAGNOSIS — I8393 Asymptomatic varicose veins of bilateral lower extremities: Secondary | ICD-10-CM

## 2018-04-02 LAB — CBC WITH DIFFERENTIAL/PLATELET
BASOS ABS: 0.1 10*3/uL (ref 0.0–0.1)
BASOS PCT: 1.2 % (ref 0.0–3.0)
EOS ABS: 0.2 10*3/uL (ref 0.0–0.7)
EOS PCT: 3 % (ref 0.0–5.0)
HEMATOCRIT: 34.8 % — AB (ref 39.0–52.0)
HEMOGLOBIN: 11.5 g/dL — AB (ref 13.0–17.0)
LYMPHS PCT: 11.7 % — AB (ref 12.0–46.0)
Lymphs Abs: 0.8 10*3/uL (ref 0.7–4.0)
MCHC: 33.1 g/dL (ref 30.0–36.0)
MCV: 86 fl (ref 78.0–100.0)
MONOS PCT: 7.5 % (ref 3.0–12.0)
Monocytes Absolute: 0.5 10*3/uL (ref 0.1–1.0)
NEUTROS ABS: 5.1 10*3/uL (ref 1.4–7.7)
Neutrophils Relative %: 76.6 % (ref 43.0–77.0)
Platelets: 266 10*3/uL (ref 150.0–400.0)
RBC: 4.05 Mil/uL — ABNORMAL LOW (ref 4.22–5.81)
RDW: 15.4 % (ref 11.5–15.5)
WBC: 6.6 10*3/uL (ref 4.0–10.5)

## 2018-04-02 LAB — BASIC METABOLIC PANEL
BUN: 27 mg/dL — AB (ref 6–23)
CHLORIDE: 103 meq/L (ref 96–112)
CO2: 29 meq/L (ref 19–32)
CREATININE: 1.14 mg/dL (ref 0.40–1.50)
Calcium: 9 mg/dL (ref 8.4–10.5)
GFR: 64.65 mL/min (ref >60.00)
GLUCOSE: 132 mg/dL — AB (ref 70–99)
POTASSIUM: 4.3 meq/L (ref 3.5–5.1)
Sodium: 138 mEq/L (ref 135–145)

## 2018-04-02 NOTE — Progress Notes (Signed)
Subjective:  Patient ID: [name] [name], male    DOB: 18-Nov-1928  Age: 84 y.o. MRN: [id number]  CC: No chief complaint on file.   HPI [name] [name] presents for CAD, HTN, allergies f/u  Outpatient Medications Prior to Visit  Medication Sig Dispense Refill  . aspirin 81 MG tablet Take 1 tablet (81 mg total) by mouth daily. 30 tablet 11  . atorvastatin (LIPITOR) 40 MG tablet Take 20 mg by mouth 2 (two) times daily.    . carvedilol (COREG) 25 MG tablet TAKE ONE-HALF (1/2) TABLET TWICE A DAY 90 tablet 2  . Cholecalciferol 1000 UNITS tablet Take 1,000 Units by mouth daily.      . clopidogrel (PLAVIX) 75 MG tablet Take 1 tablet (75 mg total) by mouth daily. 90 tablet 3  . hydrochlorothiazide (MICROZIDE) 12.5 MG capsule TAKE 1 CAPSULE DAILY 90 capsule 3  . isosorbide mononitrate (IMDUR) 30 MG 24 hr tablet Take 0.5 tablets (15 mg total) by mouth daily. 45 tablet 3  . loratadine (CLARITIN) 10 MG tablet Take 1 tablet (10 mg total) by mouth daily. 100 tablet 3  . losartan (COZAAR) 100 MG tablet TAKE ONE-HALF (1/2) TABLET DAILY 45 tablet 3  . Multiple Vitamins-Minerals (PRESERVISION AREDS 2) CAPS Take 1 capsule by mouth daily.     . nitroGLYCERIN (NITROLINGUAL) 0.4 MG/SPRAY spray Place 1 spray under the tongue every 5 (five) minutes x 3 doses as needed for chest pain.     . promethazine-codeine (PHENERGAN WITH CODEINE) 6.25-10 MG/5ML syrup Take 5 mLs by mouth every 4 (four) hours as needed. 300 mL 0   No facility-administered medications prior to visit.     ROS: Review of Systems  Constitutional: Negative for appetite change, fatigue and unexpected weight change.  HENT: Negative for congestion, nosebleeds, sneezing, sore throat and trouble swallowing.   Eyes: Negative for itching and visual disturbance.  Respiratory: Negative for cough.   Cardiovascular: Negative for chest pain, palpitations and leg swelling.  Gastrointestinal: Negative for abdominal distention, blood in stool, diarrhea and  nausea.  Genitourinary: Negative for frequency and hematuria.  Musculoskeletal: Positive for arthralgias. Negative for back pain, gait problem, joint swelling and neck pain.  Skin: Negative for rash.  Neurological: Negative for dizziness, tremors, speech difficulty and weakness.  Psychiatric/Behavioral: Negative for agitation, dysphoric mood and sleep disturbance. The patient is not nervous/anxious.     Objective:  BP 124/66 (BP Location: Left Arm, Patient Position: Sitting, Cuff Size: Large)   Pulse 79   Temp 98 F (36.7 C) (Oral)   Ht 5\' 6"  (1.676 m)   Wt 163 lb (73.9 kg)   SpO2 94%   BMI 26.31 kg/m   BP Readings from Last 3 Encounters:  04/02/18 124/66  02/02/18 128/66  12/29/17 126/64    Wt Readings from Last 3 Encounters:  04/02/18 163 lb (73.9 kg)  02/02/18 164 lb (74.4 kg)  12/29/17 165 lb (74.8 kg)    Physical Exam  Constitutional: He is oriented to person, place, and time. He appears well-developed. No distress.  NAD  HENT:  Mouth/Throat: Oropharynx is clear and moist.  Eyes: Pupils are equal, round, and reactive to light. Conjunctivae are normal.  Neck: Normal range of motion. No JVD present. No thyromegaly present.  Cardiovascular: Normal rate, regular rhythm, normal heart sounds and intact distal pulses. Exam reveals no gallop and no friction rub.  No murmur heard. Pulmonary/Chest: Effort normal and breath sounds normal. No respiratory distress. He has no wheezes. He has  no rales. He exhibits no tenderness.  Abdominal: Soft. Bowel sounds are normal. He exhibits no distension and no mass. There is no tenderness. There is no rebound and no guarding.  Musculoskeletal: Normal range of motion. He exhibits no edema or tenderness.  Lymphadenopathy:    He has no cervical adenopathy.  Neurological: He is alert and oriented to person, place, and time. He has normal reflexes. No cranial nerve deficit. He exhibits normal muscle tone. He displays a negative Romberg sign.  Coordination and gait normal.  Skin: Skin is warm and dry. No rash noted.  Psychiatric: He has a normal mood and affect. His behavior is normal. Judgment and thought content normal.    Lab Results  Component Value Date   WBC 11.3 (H) 11/25/2017   HGB 12.8 (L) 11/25/2017   HCT 40.6 11/25/2017   PLT 213 11/25/2017   GLUCOSE 143 (H) 11/25/2017   CHOL 91 (L) 11/09/2017   TRIG 121 11/09/2017   HDL 35 (L) 11/09/2017   LDLDIRECT 35.0 12/24/2016   LDLCALC 32 11/09/2017   ALT 27 11/25/2017   AST 23 11/25/2017   NA 136 11/25/2017   K 4.3 11/25/2017   CL 103 11/25/2017   CREATININE 1.03 11/25/2017   BUN 26 (H) 11/25/2017   CO2 22 11/25/2017   TSH 3.71 12/24/2016   PSA 3.11 12/24/2016   INR 1.0 03/25/2016   HGBA1C 6.4 12/24/2016    Dg Chest 2 View  Result Date: 12/29/2017 CLINICAL DATA:  Cough for the past month. EXAM: CHEST - 2 VIEW COMPARISON:  Chest x-ray dated September 16, 2014. FINDINGS: Unchanged right chest wall AICD. Stable borderline cardiomegaly status post CABG. Normal pulmonary vascularity. No focal consolidation, pleural effusion, or pneumothorax. No acute osseous abnormality. IMPRESSION: No active cardiopulmonary disease. Electronically Signed   By: Titus Dubin M.D.   On: 12/29/2017 15:43    Assessment & Plan:   There are no diagnoses linked to this encounter.   No orders of the defined types were placed in this encounter.    Follow-up: No follow-ups on file.  Walker Kehr, MD

## 2018-04-02 NOTE — Assessment & Plan Note (Addendum)
Good hydration 

## 2018-04-02 NOTE — Assessment & Plan Note (Addendum)
Isosorbide, Losartan, Coreg, HCTZ

## 2018-04-02 NOTE — Assessment & Plan Note (Addendum)
Chronic  ASA, Lipitor, Plavix Isosorbide, Losartan, Coreg, HCTZ

## 2018-04-02 NOTE — Assessment & Plan Note (Signed)
No change 

## 2018-04-02 NOTE — Assessment & Plan Note (Addendum)
   ASA Isosorbide, Losartan, Coreg, HCTZ

## 2018-04-02 NOTE — Assessment & Plan Note (Signed)
ASA, Lipitor Isosorbide, Losartan, Coreg, HCTZ

## 2018-04-02 NOTE — Assessment & Plan Note (Signed)
Lipitor 

## 2018-04-26 IMAGING — DX DG CHEST 2V
2 series · 2 of 2 positions shown · non-contrast
Comparison: Chest x-ray dated September 16, 2014.

CLINICAL DATA: Cough for the past month.

EXAM:
CHEST - 2 VIEW

[chest pa]
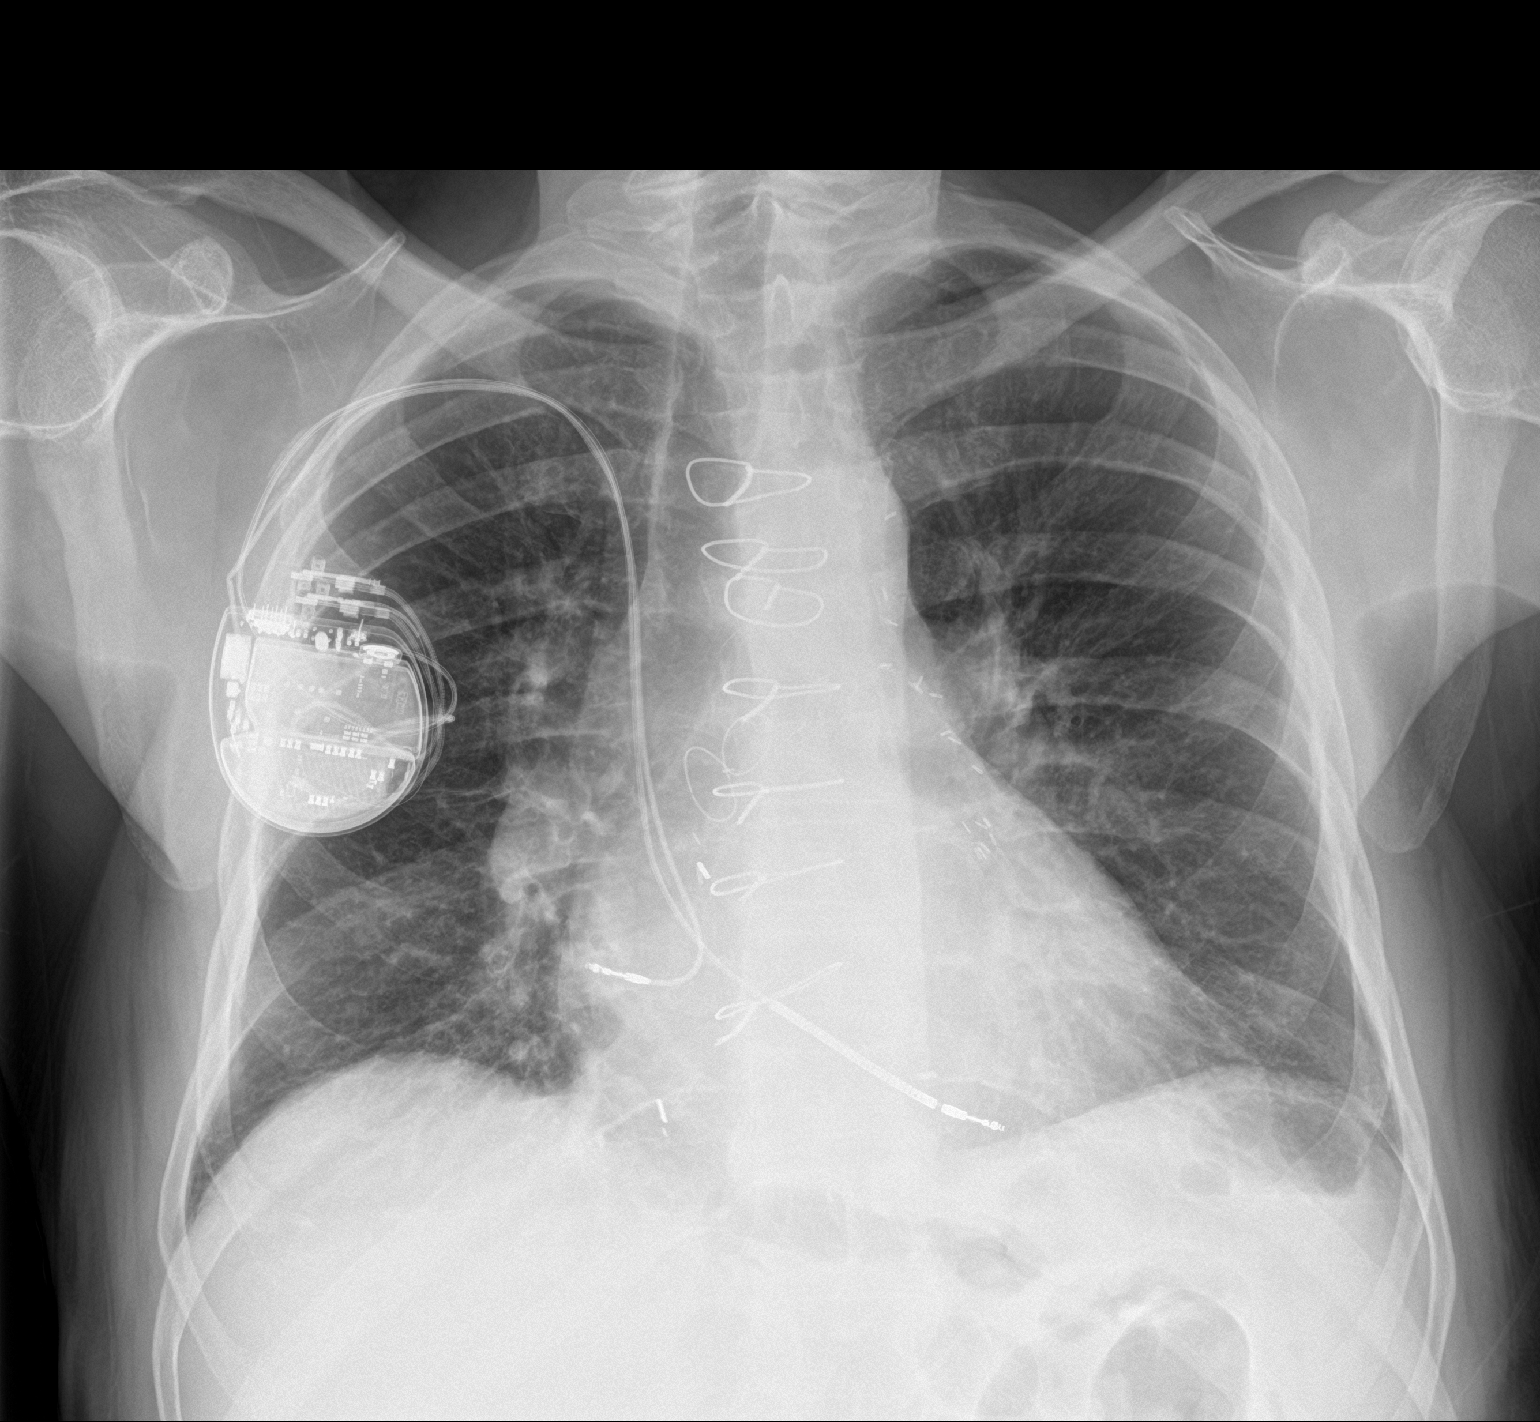

[chest lat]
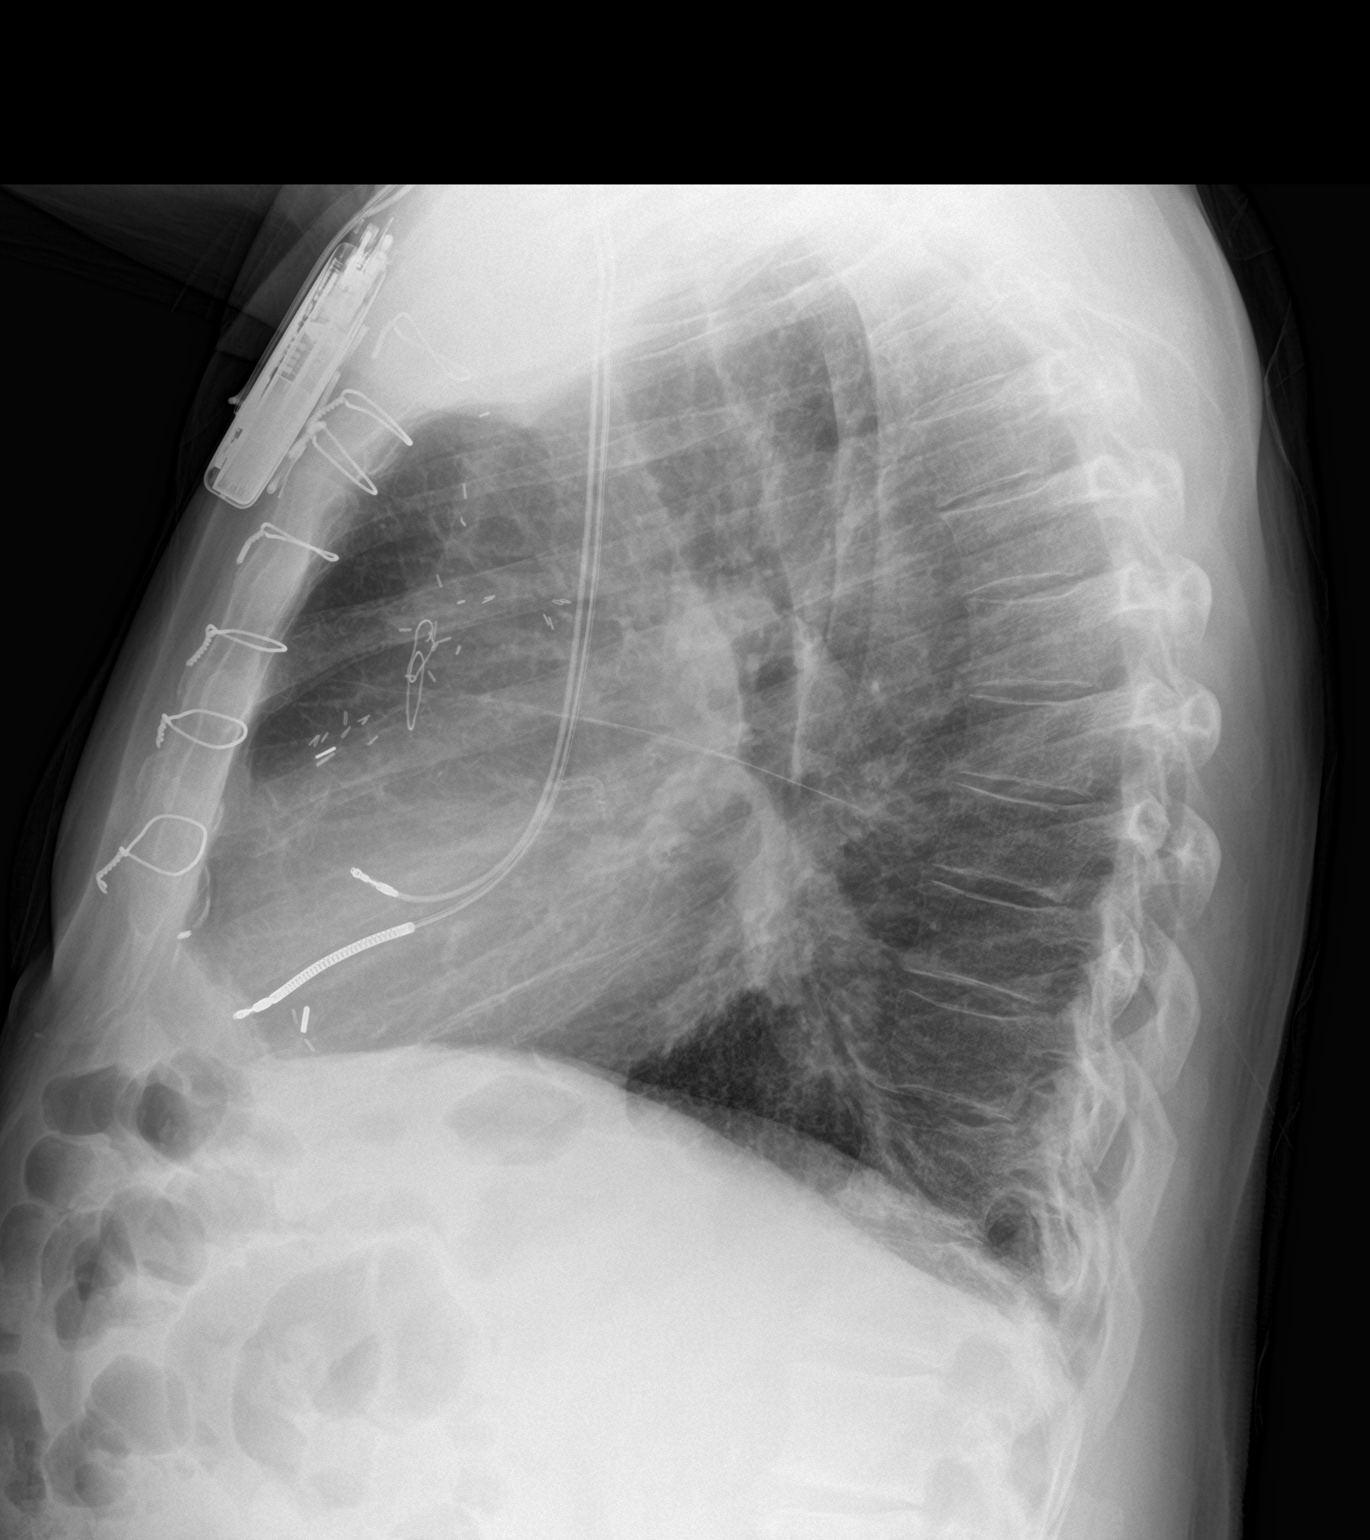

[2 of 2 positions shown; findings below may reference images not displayed]

FINDINGS: Unchanged right chest wall AICD. Stable borderline cardiomegaly
status post CABG. Normal pulmonary vascularity. No focal
consolidation, pleural effusion, or pneumothorax. No acute osseous
abnormality.
IMPRESSION: No active cardiopulmonary disease.

## 2018-05-03 ENCOUNTER — Ambulatory Visit (INDEPENDENT_AMBULATORY_CARE_PROVIDER_SITE_OTHER): Payer: Medicare Other | Admitting: *Deleted

## 2018-05-03 DIAGNOSIS — I255 Ischemic cardiomyopathy: Secondary | ICD-10-CM | POA: Diagnosis not present

## 2018-05-04 ENCOUNTER — Other Ambulatory Visit: Payer: Self-pay | Admitting: Internal Medicine

## 2018-05-04 NOTE — Progress Notes (Signed)
Remote ICD transmission.   

## 2018-05-07 DIAGNOSIS — G4733 Obstructive sleep apnea (adult) (pediatric): Secondary | ICD-10-CM | POA: Diagnosis not present

## 2018-05-25 ENCOUNTER — Encounter: Payer: Self-pay | Admitting: Cardiovascular Disease

## 2018-05-25 ENCOUNTER — Ambulatory Visit (INDEPENDENT_AMBULATORY_CARE_PROVIDER_SITE_OTHER): Payer: Medicare Other | Admitting: Cardiovascular Disease

## 2018-05-25 VITALS — BP 114/60 | HR 70 | Ht 66.0 in | Wt 168.0 lb

## 2018-05-25 DIAGNOSIS — I2510 Atherosclerotic heart disease of native coronary artery without angina pectoris: Secondary | ICD-10-CM | POA: Diagnosis not present

## 2018-05-25 MED ORDER — ISOSORBIDE MONONITRATE ER 30 MG PO TB24: 30.0000 mg | ORAL_TABLET | Freq: Every day | ORAL | 3 refills | Status: DC

## 2018-05-25 MED ORDER — NITROGLYCERIN 0.4 MG/SPRAY TL SOLN: 1.0000 | 3 refills | Status: DC | PRN

## 2018-05-25 NOTE — Progress Notes (Signed)
Cardiology Office Note   Date:  05/25/2018   ID:  [name] COMMISSO, DOB [birth year], MRN [id number]  PCP:  Cassandria Anger, MD  Cardiologist:  Mertie Moores, MD   Problem list 1. Coronary artery disease-status post coronary artery bypass grafting 2. Hyperlipidemia 3. Chronic systolic congestive heart failure - EF 35-40% 4. ICD -  5.  OSA - Fransico Him, MD   Chief Complaint  Patient presents with  . Coronary Artery Disease      Notes prior to 2016  [name] [name] is a 84 y.o. male who presents today to follow-up coronary disease and ischemic cardiomyopathy and a history of hypertension. He did have an episode of syncope in the past that was related to hypotension. More recently his blood pressure has been elevated. He was hesitant to have his ramapril dose increased because he associated this with his syncopal episode in the past. Losartan was added to his medications. He is getting excellent control and I have left [name] on both. He feels good. There is no chest pain. He is fully active. He has an ICD in place that is followed carefully by electrophysiology.  Retired [employer] ( [place] )   Nov. 1, 2016:  Doing well.  No CP or dyspnea. 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   January 08, 2016:  See for follow up for his CAD / CABG, chronic systolic CHF , HTN Doing well.    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   Stays active  Does not ride anymore.  Has some fatigue , no CP   Jan. 4, 2018:  Still taking care of his horses.   Feeds and hay twice  No CP , no dyspnea.   Aug. 29, 2018:  [name] is doing well from a cardiac standpoint  Has leg pain . 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 25, 2019:  Doing well .  Gets fatigued when he goes down to take care of the horses.  No CP or dyspnea .  Some DOE with working   May 25, 2018: Seen for follow  Still having some chest pain .    Lasted 30 minutes.    Took NTG with relief. Chest tightness with activity Has known severe CAD  With patent LIMA. Had 2 DES stents to his ramus Int. In 2017.  His symptoms are not nearly as severe as they were in 2017 when he was sent to the hospital and had stents placed in the ramus intermediate branch.   Past Medical History:  Diagnosis Date  . AICD (automatic cardioverter/defibrillator) present   . Arrhythmia   . Arthritis    "hands, legs" (03/26/2016)  . 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 Chronic systolic CHF (congestive heart failure) ([place])    a. Echo 9/14: Mild LVH, mild focal basal septal hypertrophy, EF 35-40%, anteroseptal HK, normal diastolic function, mild MR, mild LAE, PASP 34 mmHg  . Chronotropic incompetence    treated w/pacemaker, ICD 7.2008. This was placed after CPX done  post 2008 cath. CPX showed only chronotropic incompetence  . History of blood transfusion 1969   "after getting MSO4; it destroys my corpuscles" (03/26/2016)  . History of colon polyps   . Hyperlipidemia   . Hypertension   . Migraine    "just before my bypass" (03/26/2016)  . Mitral regurgitation    mild  . Myocardial infarction ([place]) "several"  . Numbness and tingling of left arm and leg    improved after tx w/prednisone  . OSA (obstructive sleep apnea) 09/01/2016   Severe OSA with AHI 38/hr now on BiPAP at 21/17cm H2O  . Syncopal episodes    relative hypotension    Past Surgical History:  Procedure Laterality Date  . BACK SURGERY    . CARDIAC CATHETERIZATION  1980s-1990s X 4  . CARDIAC CATHETERIZATION N/A 03/26/2016   Procedure: Left Heart Cath and Cors/Grafts Angiography;  Surgeon: Burnell Blanks, MD;  Location: [facility];  Service: Cardiovascular;  Laterality: N/A;  . CARDIAC CATHETERIZATION N/A 03/26/2016   Procedure: Coronary Stent Intervention;  Surgeon: Burnell Blanks, MD;  Location: [place] [facility];  Service: Cardiovascular;  Laterality: N/A;  . CARDIAC DEFIBRILLATOR PLACEMENT  2008  . CATARACT EXTRACTION W/ INTRAOCULAR LENS  IMPLANT, BILATERAL Bilateral 08/2013 - 09/2013   right - left   . CORONARY ANGIOPLASTY WITH STENT PLACEMENT  ~ 1992; 03/26/2016   "1 + 2"  . CORONARY ARTERY BYPASS GRAFT  1992   CABG "X4"  . CYSTOSCOPY  1980s  . GAS/FLUID EXCHANGE Right 06/18/2017   Procedure: GAS/FLUID EXCHANGE (C3F8);  Surgeon: Sherlynn Stalls, MD;  Location: [place];  Service: Ophthalmology;  Laterality: Right;  . IMPLANTABLE CARDIOVERTER DEFIBRILLATOR (ICD) GENERATOR CHANGE N/A 06/21/2014   Procedure: ICD GENERATOR CHANGE;  Surgeon: Deboraha Sprang, MD;  [facility];  Service: Cardiovascular;  Laterality: N/A;  . PARS PLANA VITRECTOMY Right 06/18/2017   Procedure: PARS PLANA VITRECTOMY WITH 25 GAUGE;  Surgeon: Sherlynn Stalls, MD;  Location: [place];  Service: Ophthalmology;  Laterality: Right;  . PHOTOCOAGULATION WITH LASER Right 06/18/2017   Procedure: PHOTOCOAGULATION WITH LASER- ENDO LASER;  Surgeon: Sherlynn Stalls, MD;  Location: [place];  Service: Ophthalmology;  Laterality: Right;  . POSTERIOR LUMBAR FUSION  ~ 1989   "fused 2 discs"  . TIBIA FRACTURE SURGERY Left 1943   "put artificial bone in there"    Patient Active Problem List   Diagnosis Date Noted  . Chronic systolic CHF (congestive heart failure) ([place]) 11/18/2013    Priority: High  . CAD (coronary artery disease)     Priority: High  . Varicose veins of both lower extremities without ulcer or inflammation 02/02/2018  . Allergic rhinitis 12/29/2017  . HLD (hyperlipidemia) 11/09/2017  . Left lateral epicondylitis 07/02/2017  . Knee pain, right 12/24/2016  . OSA (obstructive sleep apnea)  09/01/2016  . Status post coronary artery stent placement   . Hypertensive heart disease with heart failure ([place])   . Unstable angina ([place])   . Thumb pain 05/04/2015  . Ejection fraction   . Well adult exam 07/13/2012  . Chronic renal insufficiency, stage III (moderate) ([place]) 07/21/2011  . Ischemic cardiomyopathy   . Hx of CABG   . Chronotropic incompetence   . Syncopal episodes   . Mitral regurgitation   . Numbness and tingling of left arm and leg   . Arm pain, diffuse, left 10/22/2009  . PARESTHESIA 10/22/2009  . TOBACCO USE, QUIT 10/22/2009  . SEBACEOUS CYST 08/27/2009  .  Cough 02/28/2009  . Anemia, chronic disease 02/18/2008  . Dyslipidemia 05/03/2007  . Hypertensive heart disease 05/03/2007  . COLONIC POLYPS, HX OF 05/03/2007  . Automatic implantable cardioverter-defibrillator in situ 03/16/2007      Current Outpatient Medications  Medication Sig Dispense Refill  . atorvastatin (LIPITOR) 40 MG tablet Take 40 mg by mouth daily.    . Cholecalciferol 1000 UNITS tablet Take 1,000 Units by mouth daily.      . clopidogrel (PLAVIX) 75 MG tablet Take 1 tablet (75 mg total) by mouth daily. 90 tablet 3  . hydrochlorothiazide (MICROZIDE) 12.5 MG capsule Take 12.5 mg by mouth daily.    . isosorbide mononitrate (IMDUR) 30 MG 24 hr tablet Take 0.5 tablets (15 mg total) by mouth daily. 45 tablet 3  . loratadine (CLARITIN) 10 MG tablet Take 1 tablet (10 mg total) by mouth daily. 100 tablet 3  . losartan (COZAAR) 100 MG tablet Take 50 mg by mouth daily.    . Multiple Vitamins-Minerals (PRESERVISION AREDS 2) CAPS Take 1 capsule by mouth daily.     . nitroGLYCERIN (NITROLINGUAL) 0.4 MG/SPRAY spray Place 1 spray under the tongue every 5 (five) minutes x 3 doses as needed for chest pain.      No current facility-administered medications for this visit.     Allergies:   Morphine and Ramipril    Social History:  The patient  reports that he has quit smoking. His smoking use included  cigars. He quit after 29.00 years of use. He has never used smokeless tobacco. He reports that he drinks alcohol. He reports that he does not use drugs.   Family History:  The patient's family history includes Colon cancer in his other; Hypertension in his father, mother, and other.    ROS: Noted in current history, otherwise review of systems is negative.   Physical Exam: Blood pressure 114/60, pulse 70, height 5\' 6"  (1.676 m), weight 168 lb (76.2 kg), SpO2 95 %.  GEN:  Well nourished, well developed in no acute distress HEENT: Normal NECK: No JVD; No carotid bruit LYMPHATICS: No lymphadenopathy CARDIAC: RR, no murmurs, rubs, gallops RESPIRATORY:  Clear to auscultation without rales, wheezing or rhonchi  ABDOMEN: Soft, non-tender, non-distended MUSCULOSKELETAL:  No edema; No deformity  SKIN: Warm and dry NEUROLOGIC:  Alert and oriented x 3   EKG:   May 25, 2018.  A paced with prolonged AV conduction.  Recent Labs: 11/25/2017: ALT 27 04/02/2018: BUN 27; Creatinine, Ser 1.14; Hemoglobin 11.5; Platelets 266.0; Potassium 4.3; Sodium 138    Lipid Panel    Component Value Date/Time   CHOL 91 (L) 11/09/2017 1052   TRIG 121 11/09/2017 1052   HDL 35 (L) 11/09/2017 1052   CHOLHDL 2.6 11/09/2017 1052   CHOLHDL 4 12/24/2016 1103   VLDL 41.8 (H) 12/24/2016 1103   LDLCALC 32 11/09/2017 1052   LDLDIRECT 35.0 12/24/2016 1103      Wt Readings from Last 3 Encounters:  05/25/18 168 lb (76.2 kg)  04/02/18 163 lb (73.9 kg)  02/02/18 164 lb (74.4 kg)      Current medicines are reviewed  The patient understands his medications.   ASSESSMENT AND PLAN:  1. Coronary artery disease-status post coronary artery bypass grafting he has only 1 of 3 grafts are patent-his LIMA to LAD.  He has chronic stable angina.  Sounds like his symptoms are not any worse than they were previously. Increase the isosorbide to 30 mg a day.  I have encouraged [name] to continue  taking nitroglycerin as  needed. Symptoms are not nearly as severe as they were asleep prior to his last PCI.  2. Hyperlipidemia -     lipids are fairly well controlled.  His total cholesterol is 91.  HDL is 35.  LDL is 32.  3. Chronic systolic congestive heart failure -   Stable   4. ICD -    5. Obstructive sleep apnea - followed by Dr. Ivin Poot, MD  05/25/2018 11:07 AM    [address],  [place], [place]  [address] Pager [phone] Phone: [phone]; Fax: [phone]

## 2018-05-25 NOTE — Patient Instructions (Addendum)
Medication Instructions:  Your physician has recommended you make the following change in your medication:   INCREASE Imdur (Isosorbide mononitrate) to 30 mg once daily STOP HCTZ (hydrochlorothiazide)  Labwork: None Ordered   Testing/Procedures: None Ordered   Follow-Up: Your physician wants you to follow-up in: 6 months with Truitt Merle, NP. You will receive a reminder letter in the mail two months in advance. If you don't receive a letter, please call our office to schedule the follow-up appointment.   If you need a refill on your cardiac medications before your next appointment, please call your pharmacy.   Thank you for choosing [facility]! Christen Bame, RN [phone]

## 2018-06-07 LAB — CUP PACEART REMOTE DEVICE CHECK
Battery Remaining Longevity: 50 mo
Battery Remaining Percentage: 59 %
Battery Voltage: 2.93 V
Brady Statistic AP VP Percent: 4.4 %
Brady Statistic AS VP Percent: 1 %
Brady Statistic AS VS Percent: 5.8 %
HIGH POWER IMPEDANCE MEASURED VALUE: 70 Ohm
HighPow Impedance: 70 Ohm
Implantable Lead Implant Date: 20080711
Implantable Lead Implant Date: 20080711
Implantable Lead Location: 753860
Implantable Lead Model: 7122
Lead Channel Impedance Value: 390 Ohm
Lead Channel Pacing Threshold Amplitude: 0.625 V
Lead Channel Pacing Threshold Pulse Width: 0.5 ms
Lead Channel Pacing Threshold Pulse Width: 0.5 ms
Lead Channel Sensing Intrinsic Amplitude: 12 mV
MDC IDC LEAD LOCATION: 753859
MDC IDC MSMT LEADCHNL RA SENSING INTR AMPL: 2 mV
MDC IDC MSMT LEADCHNL RV IMPEDANCE VALUE: 390 Ohm
MDC IDC MSMT LEADCHNL RV PACING THRESHOLD AMPLITUDE: 0.75 V
MDC IDC PG IMPLANT DT: 20151007
MDC IDC SESS DTM: 20190819153742
MDC IDC SET LEADCHNL RA PACING AMPLITUDE: 1.625
MDC IDC SET LEADCHNL RV PACING AMPLITUDE: 2.5 V
MDC IDC SET LEADCHNL RV PACING PULSEWIDTH: 0.5 ms
MDC IDC SET LEADCHNL RV SENSING SENSITIVITY: 0.5 mV
MDC IDC STAT BRADY AP VS PERCENT: 90 %
MDC IDC STAT BRADY RA PERCENT PACED: 93 %
MDC IDC STAT BRADY RV PERCENT PACED: 4.4 %
Pulse Gen Serial Number: 7225183

## 2018-06-20 ENCOUNTER — Other Ambulatory Visit: Payer: Self-pay | Admitting: Cardiovascular Disease

## 2018-07-06 ENCOUNTER — Ambulatory Visit (INDEPENDENT_AMBULATORY_CARE_PROVIDER_SITE_OTHER): Payer: Medicare Other | Admitting: Internal Medicine

## 2018-07-06 ENCOUNTER — Encounter: Payer: Self-pay | Admitting: Internal Medicine

## 2018-07-06 VITALS — BP 132/80 | HR 70 | Ht 66.0 in | Wt 167.0 lb

## 2018-07-06 DIAGNOSIS — R0602 Shortness of breath: Secondary | ICD-10-CM | POA: Diagnosis not present

## 2018-07-06 DIAGNOSIS — I10 Essential (primary) hypertension: Secondary | ICD-10-CM | POA: Diagnosis not present

## 2018-07-06 DIAGNOSIS — Z95810 Presence of automatic (implantable) cardiac defibrillator: Secondary | ICD-10-CM

## 2018-07-06 DIAGNOSIS — I255 Ischemic cardiomyopathy: Secondary | ICD-10-CM

## 2018-07-06 MED ORDER — ISOSORBIDE MONONITRATE ER 60 MG PO TB24: 60.0000 mg | ORAL_TABLET | Freq: Every day | ORAL | 3 refills | Status: DC

## 2018-07-06 NOTE — Patient Instructions (Signed)
Medication Instructions:   Your physician has recommended you make the following change in your medication:   1. Increase your Imdur to 60mg , 2 tablets of your current dose at home, once per day.  Labwork: None ordered.  Testing/Procedures: None ordered.   Follow-Up:  Your physician wants you to follow-up in: One Year with Dr Caryl Comes. You will receive a reminder letter in the mail two months in advance. If you don't receive a letter, please call our office to schedule the follow-up appointment.  Remote monitoring is used to monitor your Pacemaker of ICD from home. This monitoring reduces the number of office visits required to check your device to one time per year. It allows [place] to keep an eye on the functioning of your device to ensure it is working properly. You are scheduled for a device check from home on 08/02/2018. You may send your transmission at any time that day. If you have a wireless device, the transmission will be sent automatically. After your physician reviews your transmission, you will receive a postcard with your next transmission date.    Any Other Special Instructions Will Be Listed Below (If Applicable).     If you need a refill on your cardiac medications before your next appointment, please call your pharmacy.

## 2018-07-06 NOTE — Progress Notes (Signed)
Patient Care Team: Plotnikov, Evie Lacks, MD as PCP - General Nahser, Wonda Cheng, MD as PCP - Cardiology (Cardiology) Deboraha Sprang, MD as PCP - Electrophysiology (Cardiology)   HPI  [name] [name] is a 84 y.o. male seen in followup for an ICD implanted for primary prevention in the setting of ischemic heart disease with prior bypass surgery. He underwent  generator replacement  10/15. He underwent device generator replacement 10/15 he underwent catheterization 7/17 for unstable angina and had DES 2 to his RI. EF 35-45%   the chart was reviewed.  He had seen the PA, with complaints of chest pain and shortness of breath.  He is now having worsening problems with dyspnea on exertion.  There is no significant chest discomfort apart from an intermittent sharp pain.  No rest dyspnea.  Reminds him as best as he can recall his symptoms prior to his last catheterization..          Echo 9/14 EF 35-40%   Date Cr K  7/19 1.14 4.3           Past Medical History:  Diagnosis Date  . AICD (automatic cardioverter/defibrillator) present   . Arrhythmia   . Arthritis    "hands, legs" (03/26/2016)  . 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 Chronic systolic CHF (congestive heart failure) ([place])    a. Echo 9/14: Mild LVH, mild focal basal septal hypertrophy, EF 35-40%, anteroseptal HK, normal diastolic function, mild MR, mild LAE, PASP 34 mmHg  . Chronotropic incompetence    treated w/pacemaker, ICD 7.2008. This was placed after CPX done post 2008 cath. CPX showed only chronotropic incompetence  . History of blood transfusion 1969   "after getting MSO4; it destroys my corpuscles" (03/26/2016)  . History of  colon polyps   . Hyperlipidemia   . Hypertension   . Migraine    "just before my bypass" (03/26/2016)  . Mitral regurgitation    mild  . Myocardial infarction ([place]) "several"  . Numbness and tingling of left arm and leg    improved after tx w/prednisone  . OSA (obstructive sleep apnea) 09/01/2016   Severe OSA with AHI 38/hr now on BiPAP at 21/17cm H2O  . Syncopal episodes    relative hypotension    Past Surgical History:  Procedure Laterality Date  . BACK SURGERY    . CARDIAC CATHETERIZATION  1980s-1990s X 4  . CARDIAC CATHETERIZATION N/A 03/26/2016   Procedure: Left Heart Cath and Cors/Grafts Angiography;  Surgeon: Burnell Blanks, MD;  Location: [place] [facility];  Service: Cardiovascular;  Laterality: N/A;  . CARDIAC CATHETERIZATION N/A 03/26/2016   Procedure: Coronary Stent Intervention;  Surgeon: Burnell Blanks, MD;  Location: [place];  Service: Cardiovascular;  Laterality: N/A;  . CARDIAC DEFIBRILLATOR PLACEMENT  2008  . CATARACT EXTRACTION W/ INTRAOCULAR LENS  IMPLANT, BILATERAL Bilateral 08/2013 - 09/2013   right - left   . CORONARY ANGIOPLASTY WITH STENT PLACEMENT  ~ 1992; 03/26/2016   "1 + 2"  . CORONARY ARTERY BYPASS GRAFT  1992   CABG "X4"  . CYSTOSCOPY  1980s  . GAS/FLUID EXCHANGE Right 06/18/2017  Procedure: GAS/FLUID EXCHANGE (C3F8);  Surgeon: Sherlynn Stalls, MD;  Location: [place];  Service: Ophthalmology;  Laterality: Right;  . IMPLANTABLE CARDIOVERTER DEFIBRILLATOR (ICD) GENERATOR CHANGE N/A 06/21/2014   Procedure: ICD GENERATOR CHANGE;  Surgeon: Deboraha Sprang, MD;  Location: [facility];  Service: Cardiovascular;  Laterality: N/A;  . PARS PLANA VITRECTOMY Right 06/18/2017   Procedure: PARS PLANA VITRECTOMY WITH 25 GAUGE;  Surgeon: Sherlynn Stalls, MD;  Location: [facility];  Service: Ophthalmology;  Laterality: Right;  . PHOTOCOAGULATION WITH LASER Right 06/18/2017   Procedure: PHOTOCOAGULATION WITH LASER- ENDO LASER;  Surgeon: Sherlynn Stalls, MD;   Location: [place] [facility];  Service: Ophthalmology;  Laterality: Right;  . POSTERIOR LUMBAR FUSION  ~ 1989   "fused 2 discs"  . TIBIA FRACTURE SURGERY Left 1943   "put artificial bone in there"    Current Outpatient Medications  Medication Sig Dispense Refill  . atorvastatin (LIPITOR) 40 MG tablet TAKE 1 TABLET DAILY 90 tablet 3  . Cholecalciferol 1000 UNITS tablet Take 1,000 Units by mouth daily.      . clopidogrel (PLAVIX) 75 MG tablet Take 1 tablet (75 mg total) by mouth daily. 90 tablet 3  . isosorbide mononitrate (IMDUR) 60 MG 24 hr tablet Take 1 tablet (60 mg total) by mouth daily. 90 tablet 3  . loratadine (CLARITIN) 10 MG tablet Take 1 tablet (10 mg total) by mouth daily. 100 tablet 3  . losartan (COZAAR) 100 MG tablet Take 50 mg by mouth daily.    . Multiple Vitamins-Minerals (PRESERVISION AREDS 2) CAPS Take 1 capsule by mouth daily.     . nitroGLYCERIN (NITROLINGUAL) 0.4 MG/SPRAY spray Place 1 spray under the tongue every 5 (five) minutes x 3 doses as needed for chest pain. 12 g 3   No current facility-administered medications for this visit.     Allergies  Allergen Reactions  . Morphine     "increased white corpuscles" Can take codeine  . Ramipril Other (See Comments)    REACTION: cough if dose is over 2.5mg     Review of Systems negative except from HPI and PMH  Physical Exam BP 132/80   Pulse 70   Ht 5\' 6"  (1.676 m)   Wt 167 lb (75.8 kg)   SpO2 96%   BMI 26.95 kg/m  Well developed and nourished in no acute distress HENT normal Neck supple with JVP 6-7 he has follow-up with Dr. Jerelene Redden really belongs to him here today thinking ureter Clear Regular rate and rhythm, no murmurs or gallops Abd-soft with active BS No Clubbing cyanosis edema Skin-warm and dry A & Oriented  Grossly normal sensory and motor function  ECG demonstrates Apacing 70 22/11/*41 Assessment and  Plan  Ischemic cardiomyopathy s/p CABG   Recent PCI  Sinus Node  dysfunction  Dyspnea  Hypertension  Lightheadedness   HFrEF  Implantable Defibrillator  [employer] Jude The patient's device was interrogated.  The information was reviewed. No changes were made in the programming.     Pt w recurrent dyspnea that has been progressive over the last couple of months.  He saw Dr. Georg Ruddle 9/19 and there is some concern about whether this could be ischemia.  It was elected to follow it.  I think for now, we will increase his indoor from 30--60.  I will reach out to Dr. Acie Fredrickson to see whether he wants to push ahead with evaluation of his coronary bed.  Euvolemic continue current meds With

## 2018-07-07 LAB — CUP PACEART INCLINIC DEVICE CHECK
Battery Remaining Longevity: 50 mo
Brady Statistic RV Percent Paced: 4.3 %
HighPow Impedance: 67.5 Ohm
Implantable Lead Location: 753860
Implantable Lead Model: 7122
Implantable Pulse Generator Implant Date: 20151007
Lead Channel Sensing Intrinsic Amplitude: 12 mV
Lead Channel Sensing Intrinsic Amplitude: 2.1 mV
Lead Channel Setting Pacing Amplitude: 2.5 V
Lead Channel Setting Pacing Pulse Width: 0.5 ms
MDC IDC LEAD IMPLANT DT: 20080711
MDC IDC LEAD IMPLANT DT: 20080711
MDC IDC LEAD LOCATION: 753859
MDC IDC MSMT LEADCHNL RA IMPEDANCE VALUE: 387.5 Ohm
MDC IDC MSMT LEADCHNL RA PACING THRESHOLD AMPLITUDE: 0.75 V
MDC IDC MSMT LEADCHNL RA PACING THRESHOLD PULSEWIDTH: 0.5 ms
MDC IDC MSMT LEADCHNL RV IMPEDANCE VALUE: 362.5 Ohm
MDC IDC MSMT LEADCHNL RV PACING THRESHOLD AMPLITUDE: 0.75 V
MDC IDC MSMT LEADCHNL RV PACING THRESHOLD PULSEWIDTH: 0.5 ms
MDC IDC PG SERIAL: 7225183
MDC IDC SESS DTM: 20191022175745
MDC IDC SET LEADCHNL RA PACING AMPLITUDE: 1.75 V
MDC IDC SET LEADCHNL RV SENSING SENSITIVITY: 0.5 mV
MDC IDC STAT BRADY RA PERCENT PACED: 93 %

## 2018-07-08 ENCOUNTER — Other Ambulatory Visit: Payer: Self-pay | Admitting: Internal Medicine

## 2018-07-08 ENCOUNTER — Other Ambulatory Visit: Payer: Self-pay | Admitting: Cardiovascular Disease

## 2018-08-02 ENCOUNTER — Other Ambulatory Visit: Payer: Self-pay | Admitting: Internal Medicine

## 2018-08-02 ENCOUNTER — Ambulatory Visit (INDEPENDENT_AMBULATORY_CARE_PROVIDER_SITE_OTHER): Payer: Medicare Other

## 2018-08-02 DIAGNOSIS — I255 Ischemic cardiomyopathy: Secondary | ICD-10-CM | POA: Diagnosis not present

## 2018-08-02 DIAGNOSIS — I5022 Chronic systolic (congestive) heart failure: Secondary | ICD-10-CM

## 2018-08-02 NOTE — Progress Notes (Signed)
Remote ICD transmission.   

## 2018-08-02 NOTE — Telephone Encounter (Signed)
Pt's pharmacy is requesting a refill on carvedilol 25 mg tablet. This medication was D/C, but I do not see where a provider D/C this medication. Does pt suppose to still be taking this medication? Please address

## 2018-08-04 MED ORDER — CARVEDILOL 12.5 MG PO TABS: 12.5000 mg | ORAL_TABLET | Freq: Two times a day (BID) | ORAL | 3 refills | Status: DC

## 2018-08-04 NOTE — Addendum Note (Signed)
Addended by: Emmaline Life on: 08/04/2018 04:37 PM   Modules accepted: Orders

## 2018-08-04 NOTE — Telephone Encounter (Signed)
Called pt and pt stated that he is still taking carvedilol. Please address

## 2018-08-04 NOTE — Telephone Encounter (Signed)
I cannot find reason for stopping except patient reported not taking at September ov with Dr. Acie Fredrickson. Please call patient to find out if he is taking the carvedilol and if so what dose

## 2018-08-05 ENCOUNTER — Encounter: Payer: Self-pay | Admitting: Cardiology

## 2018-08-05 NOTE — Progress Notes (Signed)
Letter  

## 2018-08-10 DIAGNOSIS — G4733 Obstructive sleep apnea (adult) (pediatric): Secondary | ICD-10-CM | POA: Diagnosis not present

## 2018-09-03 ENCOUNTER — Encounter: Payer: Self-pay | Admitting: Cardiovascular Disease

## 2018-09-03 ENCOUNTER — Ambulatory Visit (INDEPENDENT_AMBULATORY_CARE_PROVIDER_SITE_OTHER): Payer: Medicare Other | Admitting: Cardiovascular Disease

## 2018-09-03 VITALS — BP 142/80 | HR 74 | Ht 66.0 in | Wt 168.8 lb

## 2018-09-03 DIAGNOSIS — I5022 Chronic systolic (congestive) heart failure: Secondary | ICD-10-CM | POA: Diagnosis not present

## 2018-09-03 DIAGNOSIS — I2510 Atherosclerotic heart disease of native coronary artery without angina pectoris: Secondary | ICD-10-CM | POA: Diagnosis not present

## 2018-09-03 MED ORDER — FUROSEMIDE 40 MG PO TABS: 40.0000 mg | ORAL_TABLET | Freq: Every day | ORAL | 11 refills | Status: DC

## 2018-09-03 MED ORDER — POTASSIUM CHLORIDE ER 10 MEQ PO TBCR: 10.0000 meq | EXTENDED_RELEASE_TABLET | Freq: Every day | ORAL | 11 refills | Status: DC

## 2018-09-03 NOTE — Patient Instructions (Addendum)
Medication Instructions:  Your physician has recommended you make the following change in your medication:  START Kdur (Potassium supplement) 10 mEq once daily START Lasix (Furosemide) 40 mg once daily  If you need a refill on your cardiac medications before your next appointment, please call your pharmacy.    Lab work: Your physician recommends that you return for lab work on Tuesday January 7 You may come in anytime between 7:30 am and 4:30 pm and you do not have to fast    Testing/Procedures: None Ordered    Follow-Up: Your physician recommends that you return for a follow-up appointment on Monday March 23 at 2:00 pm

## 2018-09-03 NOTE — Progress Notes (Signed)
Cardiology Office Note   Date:  09/03/2018   ID:  [name] [name], DOB 1929/03/21, MRN [id number]  PCP:  Cassandria Anger, MD  Cardiologist:  Mertie Moores, MD   Problem list 1. Coronary artery disease-status post coronary artery bypass grafting 2. Hyperlipidemia 3. Chronic systolic congestive heart failure - EF 35-40% 4. ICD -  5.  OSA - Fransico Him, MD   Chief Complaint  Patient presents with  . Coronary Artery Disease      Notes prior to 2016  [name] [name] is a 84 y.o. male who presents today to follow-up coronary disease and ischemic cardiomyopathy and a history of hypertension. He did have an episode of syncope in the past that was related to hypotension. More recently his blood pressure has been elevated. He was hesitant to have his ramapril dose increased because he associated this with his syncopal episode in the past. Losartan was added to his medications. He is getting excellent control and I have left him on both. He feels good. There is no chest pain. He is fully active. He has an ICD in place that is followed carefully by electrophysiology.  Retired [employer] [place]   Nov. 1, 2016:  Doing well.  No CP or dyspnea. 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   January 08, 2016:  See for follow up for his CAD / CABG, chronic systolic CHF , HTN Doing well.    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   Stays active  Does not ride anymore.  Has some fatigue , no CP   Jan. 4, 2018:  Still taking care of his horses.   Feeds and hay twice  No CP , no dyspnea.   Aug. 29, 2018:  [name] [name] is doing well from a cardiac standpoint  Has leg pain . 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 25, 2019:  Doing well .  Gets fatigued when he goes down to take care of the horses.  No CP or dyspnea .  Some DOE with working   May 25, 2018: Seen for follow  Still having some chest pain .    Lasted 30 minutes.    Took NTG with relief. Chest tightness with activity Has known severe CAD  With patent LIMA. Had 2 DES stents to his ramus Int. In 2017.  His symptoms are not nearly as severe as they were in 2017 when he was sent to the hospital and had stents placed in the ramus intermediate branch.  September 03, 2018: [name] [name] is seen today for follow-up of his coronary artery disease He has had an ICD placed for primary prevention. Was having some dyspnea,   Dr. Caryl Comes increased Imdur to 60  Mg a day which has helped.   Has swelling o  Past Medical History:  Diagnosis Date  . AICD (automatic cardioverter/defibrillator) present   . Arrhythmia   . Arthritis    "hands, legs" (03/26/2016)  . 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 Chronic systolic CHF (congestive  heart failure) ([place])    a. Echo 9/14: Mild LVH, mild focal basal septal hypertrophy, EF 35-40%, anteroseptal HK, normal diastolic function, mild MR, mild LAE, PASP 34 mmHg  . Chronotropic incompetence    treated w/pacemaker, ICD 7.2008. This was placed after CPX done post 2008 cath. CPX showed only chronotropic incompetence  . History of blood transfusion 1969   "after getting MSO4; it destroys my corpuscles" (03/26/2016)  . History of colon polyps   . Hyperlipidemia   . Hypertension   . Migraine    "just before my bypass" (03/26/2016)  . Mitral regurgitation    mild  . Myocardial infarction ([place]) "several"  . Numbness and tingling of left arm and leg    improved after tx w/prednisone  . OSA (obstructive sleep apnea) 09/01/2016   Severe OSA with AHI 38/hr now on BiPAP at 21/17cm H2O  . Syncopal episodes    relative hypotension    Past  Surgical History:  Procedure Laterality Date  . BACK SURGERY    . CARDIAC CATHETERIZATION  1980s-1990s X 4  . CARDIAC CATHETERIZATION N/A 03/26/2016   Procedure: Left Heart Cath and Cors/Grafts Angiography;  Surgeon: Burnell Blanks, MD;  Location: [facility];  Service: Cardiovascular;  Laterality: N/A;  . CARDIAC CATHETERIZATION N/A 03/26/2016   Procedure: Coronary Stent Intervention;  Surgeon: Burnell Blanks, MD;  Location: [facility];  Service: Cardiovascular;  Laterality: N/A;  . CARDIAC DEFIBRILLATOR PLACEMENT  2008  . CATARACT EXTRACTION W/ INTRAOCULAR LENS  IMPLANT, BILATERAL Bilateral 08/2013 - 09/2013   right - left   . CORONARY ANGIOPLASTY WITH STENT PLACEMENT  ~ 1992; 03/26/2016   "1 + 2"  . CORONARY ARTERY BYPASS GRAFT  1992   CABG "X4"  . CYSTOSCOPY  1980s  . GAS/FLUID EXCHANGE Right 06/18/2017   Procedure: GAS/FLUID EXCHANGE (C3F8);  Surgeon: Sherlynn Stalls, MD;  Location: [facility];  Service: Ophthalmology;  Laterality: Right;  . IMPLANTABLE CARDIOVERTER DEFIBRILLATOR (ICD) GENERATOR CHANGE N/A 06/21/2014   Procedure: ICD GENERATOR CHANGE;  Surgeon: Deboraha Sprang, MD;  Location: [facility];  Service: Cardiovascular;  Laterality: N/A;  . PARS PLANA VITRECTOMY Right 06/18/2017   Procedure: PARS PLANA VITRECTOMY WITH 25 GAUGE;  Surgeon: Sherlynn Stalls, MD;  Location: [facility];  Service: Ophthalmology;  Laterality: Right;  . PHOTOCOAGULATION WITH LASER Right 06/18/2017   Procedure: PHOTOCOAGULATION WITH LASER- ENDO LASER;  Surgeon: Sherlynn Stalls, MD;  Location: [facility];  Service: Ophthalmology;  Laterality: Right;  . POSTERIOR LUMBAR FUSION  ~ 1989   "fused 2 discs"  . TIBIA FRACTURE SURGERY Left 1943   "put artificial bone in there"    Patient Active Problem List   Diagnosis Date Noted  . Chronic systolic CHF (congestive heart failure) ([place]) 11/18/2013    Priority: High  . CAD (coronary artery disease)     Priority: High  . Varicose veins of both lower  extremities without ulcer or inflammation 02/02/2018  . Allergic rhinitis 12/29/2017  . HLD (hyperlipidemia) 11/09/2017  . Left lateral epicondylitis 07/02/2017  . Knee pain, right 12/24/2016  . OSA (obstructive sleep apnea) 09/01/2016  . Status post coronary artery stent placement   . Hypertensive heart disease with heart failure ([place])   . Unstable angina ([place])   . Thumb pain 05/04/2015  . Ejection fraction   . Well adult exam 07/13/2012  . Chronic renal insufficiency, stage III (moderate) ([place]) 07/21/2011  . Ischemic cardiomyopathy   . Hx of CABG   .  Chronotropic incompetence   . Syncopal episodes   . Mitral regurgitation   . Numbness and tingling of left arm and leg   . Arm pain, diffuse, left 10/22/2009  . PARESTHESIA 10/22/2009  . TOBACCO USE, QUIT 10/22/2009  . SEBACEOUS CYST 08/27/2009  . Cough 02/28/2009  . Anemia, chronic disease 02/18/2008  . Dyslipidemia 05/03/2007  . Hypertensive heart disease 05/03/2007  . COLONIC POLYPS, HX OF 05/03/2007  . Automatic implantable cardioverter-defibrillator in situ 03/16/2007      Current Outpatient Medications  Medication Sig Dispense Refill  . atorvastatin (LIPITOR) 40 MG tablet TAKE 1 TABLET DAILY 90 tablet 3  . carvedilol (COREG) 12.5 MG tablet Take 1 tablet (12.5 mg total) by mouth 2 (two) times daily. 180 tablet 3  . Cholecalciferol 1000 UNITS tablet Take 1,000 Units by mouth daily.      . clopidogrel (PLAVIX) 75 MG tablet Take 1 tablet (75 mg total) by mouth daily. 90 tablet 4  . isosorbide mononitrate (IMDUR) 60 MG 24 hr tablet Take 1 tablet (60 mg total) by mouth daily. 90 tablet 3  . losartan (COZAAR) 100 MG tablet Take 0.5 tablets (50 mg total) by mouth daily. 45 tablet 4  . Multiple Vitamins-Minerals (PRESERVISION AREDS 2) CAPS Take 1 capsule by mouth daily.     . nitroGLYCERIN (NITROLINGUAL) 0.4 MG/SPRAY spray Place 1 spray under the tongue every 5 (five) minutes x 3 doses as needed for chest pain. 12 g 3   No  current facility-administered medications for this visit.     Allergies:   Morphine and Ramipril    Social History:  The patient  reports that he has quit smoking. His smoking use included cigars. He quit after 29.00 years of use. He has never used smokeless tobacco. He reports current alcohol use. He reports that he does not use drugs.   Family History:  The patient's family history includes Colon cancer in an other family member; Hypertension in his father, mother, and another family member.    ROS: Noted in current history, otherwise review of systems is negative.   Physical Exam: Blood pressure (!) 142/80, pulse 74, height 5\' 6"  (1.676 m), weight 168 lb 12.8 oz (76.6 kg), SpO2 97 %.  GEN: Elderly gentleman, no acute distress HEENT: Normal NECK: No JVD; No carotid bruits LYMPHATICS: No lymphadenopathy CARDIAC: RRR , no murmurs, rubs, gallops RESPIRATORY:  Clear to auscultation without rales, wheezing or rhonchi  ABDOMEN: Soft, non-tender, non-distended MUSCULOSKELETAL: Trace leg edema SKIN: Warm and dry NEUROLOGIC:  Alert and oriented x 3   EKG:      Recent Labs: 11/25/2017: ALT 27 04/02/2018: BUN 27; Creatinine, Ser 1.14; Hemoglobin 11.5; Platelets 266.0; Potassium 4.3; Sodium 138    Lipid Panel    Component Value Date/Time   CHOL 91 (L) 11/09/2017 1052   TRIG 121 11/09/2017 1052   HDL 35 (L) 11/09/2017 1052   CHOLHDL 2.6 11/09/2017 1052   CHOLHDL 4 12/24/2016 1103   VLDL 41.8 (H) 12/24/2016 1103   LDLCALC 32 11/09/2017 1052   LDLDIRECT 35.0 12/24/2016 1103      Wt Readings from Last 3 Encounters:  09/03/18 168 lb 12.8 oz (76.6 kg)  07/06/18 167 lb (75.8 kg)  05/25/18 168 lb (76.2 kg)      Current medicines are reviewed  The patient understands his medications.   ASSESSMENT AND PLAN:  1. Coronary artery disease-status post coronary artery bypass grafting he has only 1 of 3 grafts are patent-his LIMA to LAD.  He has chronic stable angina.     2.  Hyperlipidemia -     Following, stable   3. Chronic systolic congestive heart failure -   his symptoms are slightly better after increasing his isosorbide to 60 mg a day.  He still eats a fair amount of salty foods including hot dogs on a weekly basis.  We will start him on Lasix 40 mg a day and potassium chloride 10 mg a day.  We will check a basic metabolic profile in 3 weeks.  I will see him again in 3 months.  4. ICD -  Managed by Dr. Caryl Comes   5. Obstructive sleep apnea -   Mertie Moores, MD  09/03/2018 10:48 AM    [facility] [address],  [place], [place]  [address] Pager [phone] Phone: [phone]; Fax: [phone]

## 2018-09-21 ENCOUNTER — Other Ambulatory Visit: Payer: Medicare Other | Admitting: *Deleted

## 2018-09-21 DIAGNOSIS — I2510 Atherosclerotic heart disease of native coronary artery without angina pectoris: Secondary | ICD-10-CM | POA: Diagnosis not present

## 2018-09-21 DIAGNOSIS — I5022 Chronic systolic (congestive) heart failure: Secondary | ICD-10-CM

## 2018-09-21 LAB — BASIC METABOLIC PANEL
BUN/Creatinine Ratio: 21 (ref 10–24)
BUN: 26 mg/dL (ref 8–27)
CHLORIDE: 98 mmol/L (ref 96–106)
CO2: 24 mmol/L (ref 20–29)
Calcium: 9.2 mg/dL (ref 8.6–10.2)
Creatinine, Ser: 1.23 mg/dL (ref 0.76–1.27)
GFR calc Af Amer: 61 mL/min/{1.73_m2} (ref >59)
GFR, EST NON AFRICAN AMERICAN: 52 mL/min/{1.73_m2} — AB (ref >59)
Glucose: 110 mg/dL — ABNORMAL HIGH (ref 65–99)
POTASSIUM: 4.4 mmol/L (ref 3.5–5.2)
Sodium: 135 mmol/L (ref 134–144)

## 2018-09-23 ENCOUNTER — Telehealth: Payer: Self-pay | Admitting: *Deleted

## 2018-09-23 NOTE — Telephone Encounter (Signed)
-----   Message from Ermelinda Das sent at 08/24/2018  3:05 PM EST ----- Regarding: yearly sleep follow up  Patient needs yearly follow up appt with Dr. Radford Pax in February.  However, she does not have anything available.  I have place patient on the wait list. I want to make sure patient stays compliant.

## 2018-09-23 NOTE — Telephone Encounter (Signed)
Appt. Made for 12/27/2018 .Pt is aware and agreeable to treatment.

## 2018-09-28 LAB — CUP PACEART REMOTE DEVICE CHECK
Battery Remaining Longevity: 47 mo
Battery Remaining Percentage: 56 %
Battery Voltage: 2.93 V
Brady Statistic AP VP Percent: 2.9 %
Brady Statistic AS VS Percent: 3.3 %
HIGH POWER IMPEDANCE MEASURED VALUE: 65 Ohm
HighPow Impedance: 65 Ohm
Implantable Lead Location: 753859
Implantable Lead Location: 753860
Implantable Lead Model: 7122
Implantable Pulse Generator Implant Date: 20151007
Lead Channel Impedance Value: 350 Ohm
Lead Channel Pacing Threshold Amplitude: 0.625 V
Lead Channel Pacing Threshold Pulse Width: 0.5 ms
Lead Channel Sensing Intrinsic Amplitude: 12 mV
Lead Channel Setting Pacing Amplitude: 1.625
Lead Channel Setting Sensing Sensitivity: 0.5 mV
MDC IDC LEAD IMPLANT DT: 20080711
MDC IDC LEAD IMPLANT DT: 20080711
MDC IDC MSMT LEADCHNL RA IMPEDANCE VALUE: 360 Ohm
MDC IDC MSMT LEADCHNL RA SENSING INTR AMPL: 2.1 mV
MDC IDC MSMT LEADCHNL RV PACING THRESHOLD AMPLITUDE: 0.75 V
MDC IDC MSMT LEADCHNL RV PACING THRESHOLD PULSEWIDTH: 0.5 ms
MDC IDC SESS DTM: 20191118080847
MDC IDC SET LEADCHNL RV PACING AMPLITUDE: 2.5 V
MDC IDC SET LEADCHNL RV PACING PULSEWIDTH: 0.5 ms
MDC IDC STAT BRADY AP VS PERCENT: 93 %
MDC IDC STAT BRADY AS VP PERCENT: 1 %
MDC IDC STAT BRADY RA PERCENT PACED: 95 %
MDC IDC STAT BRADY RV PERCENT PACED: 2.9 %
Pulse Gen Serial Number: 7225183

## 2018-10-01 ENCOUNTER — Ambulatory Visit (INDEPENDENT_AMBULATORY_CARE_PROVIDER_SITE_OTHER): Payer: Medicare Other | Admitting: Family

## 2018-10-01 ENCOUNTER — Ambulatory Visit (INDEPENDENT_AMBULATORY_CARE_PROVIDER_SITE_OTHER)
Admission: RE | Admit: 2018-10-01 | Discharge: 2018-10-01 | Disposition: A | Discharge status: Home / Self Care | Payer: Medicare Other | Source: Ambulatory Visit | Attending: Family | Admitting: Family

## 2018-10-01 ENCOUNTER — Encounter: Payer: Self-pay | Admitting: Family

## 2018-10-01 VITALS — BP 138/76 | HR 70 | Temp 97.8°F | Ht 66.0 in | Wt 162.0 lb

## 2018-10-01 DIAGNOSIS — M545 Low back pain, unspecified: Secondary | ICD-10-CM

## 2018-10-01 MED ORDER — METHYLPREDNISOLONE ACETATE 40 MG/ML IJ SUSP
40.0000 mg | Freq: Once | INTRAMUSCULAR | Status: AC
  Administered 2018-10-01: 40 mg via INTRAMUSCULAR

## 2018-10-01 MED ORDER — BACLOFEN 10 MG PO TABS: 10.0000 mg | ORAL_TABLET | Freq: Three times a day (TID) | ORAL | 0 refills | Status: DC

## 2018-10-01 NOTE — Progress Notes (Signed)
ARLOW SPIERS is a 85 y.o. male with the following history as recorded in EpicCare:  Patient Active Problem List   Diagnosis Date Noted  . Varicose veins of both lower extremities without ulcer or inflammation 02/02/2018  . Allergic rhinitis 12/29/2017  . HLD (hyperlipidemia) 11/09/2017  . Left lateral epicondylitis 07/02/2017  . Knee pain, right 12/24/2016  . OSA (obstructive sleep apnea) 09/01/2016  . Status post coronary artery stent placement   . Hypertensive heart disease with heart failure ([place])   . Unstable angina ([place])   . Thumb pain 05/04/2015  . Chronic systolic CHF (congestive heart failure) ([place]) 11/18/2013  . Ejection fraction   . CAD (coronary artery disease)   . Well adult exam 07/13/2012  . Chronic renal insufficiency, stage III (moderate) ([place]) 07/21/2011  . Ischemic cardiomyopathy   . Hx of CABG   . Chronotropic incompetence   . Syncopal episodes   . Mitral regurgitation   . Numbness and tingling of left arm and leg   . Arm pain, diffuse, left 10/22/2009  . PARESTHESIA 10/22/2009  . TOBACCO USE, QUIT 10/22/2009  . SEBACEOUS CYST 08/27/2009  . Cough 02/28/2009  . Anemia, chronic disease 02/18/2008  . Dyslipidemia 05/03/2007  . Hypertensive heart disease 05/03/2007  . COLONIC POLYPS, HX OF 05/03/2007  . Automatic implantable cardioverter-defibrillator in situ 03/16/2007    Current Outpatient Medications  Medication Sig Dispense Refill  . atorvastatin (LIPITOR) 40 MG tablet TAKE 1 TABLET DAILY 90 tablet 3  . carvedilol (COREG) 12.5 MG tablet Take 1 tablet (12.5 mg total) by mouth 2 (two) times daily. 180 tablet 3  . Cholecalciferol 1000 UNITS tablet Take 1,000 Units by mouth daily.      . clopidogrel (PLAVIX) 75 MG tablet Take 1 tablet (75 mg total) by mouth daily. 90 tablet 4  . furosemide (LASIX) 40 MG tablet Take 1 tablet (40 mg total) by mouth daily. 30 tablet 11  . isosorbide mononitrate (IMDUR) 60 MG 24 hr tablet Take 1 tablet (60 mg total) by mouth  daily. 90 tablet 3  . losartan (COZAAR) 100 MG tablet Take 0.5 tablets (50 mg total) by mouth daily. 45 tablet 4  . Multiple Vitamins-Minerals (PRESERVISION AREDS 2) CAPS Take 1 capsule by mouth daily.     . nitroGLYCERIN (NITROLINGUAL) 0.4 MG/SPRAY spray Place 1 spray under the tongue every 5 (five) minutes x 3 doses as needed for chest pain. 12 g 3  . potassium chloride (K-DUR) 10 MEQ tablet Take 1 tablet (10 mEq total) by mouth daily. 30 tablet 11  . potassium chloride (K-DUR,KLOR-CON) 10 MEQ tablet Take 10 mEq by mouth daily.    . baclofen (LIORESAL) 10 MG tablet Take 1 tablet (10 mg total) by mouth 3 (three) times daily. 20 each 0   No current facility-administered medications for this visit.     Allergies: Morphine and Ramipril  Past Medical History:  Diagnosis Date  . AICD (automatic cardioverter/defibrillator) present   . Arrhythmia   . Arthritis    "hands, legs" (03/26/2016)  . 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 Chronic systolic CHF (congestive heart failure) ([place])    a. Echo 9/14: Mild LVH,  mild focal basal septal hypertrophy, EF 35-40%, anteroseptal HK, normal diastolic function, mild MR, mild LAE, PASP 34 mmHg  . Chronotropic incompetence    treated w/pacemaker, ICD 7.2008. This was placed after CPX done post 2008 cath. CPX showed only chronotropic incompetence  . History of blood transfusion 1969   "after getting MSO4; it destroys my corpuscles" (03/26/2016)  . History of colon polyps   . Hyperlipidemia   . Hypertension   . Migraine    "just before my bypass" (03/26/2016)  . Mitral regurgitation    mild  . Myocardial infarction ([place]) "several"  . Numbness and tingling of left arm and leg     improved after tx w/prednisone  . OSA (obstructive sleep apnea) 09/01/2016   Severe OSA with AHI 38/hr now on BiPAP at 21/17cm H2O  . Syncopal episodes    relative hypotension    Past Surgical History:  Procedure Laterality Date  . BACK SURGERY    . CARDIAC CATHETERIZATION  1980s-1990s X 4  . CARDIAC CATHETERIZATION N/A 03/26/2016   Procedure: Left Heart Cath and Cors/Grafts Angiography;  Surgeon: Burnell Blanks, MD;  Location: [facility];  Service: Cardiovascular;  Laterality: N/A;  . CARDIAC CATHETERIZATION N/A 03/26/2016   Procedure: Coronary Stent Intervention;  Surgeon: Burnell Blanks, MD;  Location: [facility];  Service: Cardiovascular;  Laterality: N/A;  . CARDIAC DEFIBRILLATOR PLACEMENT  2008  . CATARACT EXTRACTION W/ INTRAOCULAR LENS  IMPLANT, BILATERAL Bilateral 08/2013 - 09/2013   right - left   . CORONARY ANGIOPLASTY WITH STENT PLACEMENT  ~ 1992; 03/26/2016   "1 + 2"  . CORONARY ARTERY BYPASS GRAFT  1992   CABG "X4"  . CYSTOSCOPY  1980s  . GAS/FLUID EXCHANGE Right 06/18/2017   Procedure: GAS/FLUID EXCHANGE (C3F8);  Surgeon: Sherlynn Stalls, MD;  Location: [facility];  Service: Ophthalmology;  Laterality: Right;  . IMPLANTABLE CARDIOVERTER DEFIBRILLATOR (ICD) GENERATOR CHANGE N/A 06/21/2014   Procedure: ICD GENERATOR CHANGE;  Surgeon: Deboraha Sprang, MD;  Location: [facility];  Service: Cardiovascular;  Laterality: N/A;  . PARS PLANA VITRECTOMY Right 06/18/2017   Procedure: PARS PLANA VITRECTOMY WITH 25 GAUGE;  Surgeon: Sherlynn Stalls, MD;  Location: [facility];  Service: Ophthalmology;  Laterality: Right;  . PHOTOCOAGULATION WITH LASER Right 06/18/2017   Procedure: PHOTOCOAGULATION WITH LASER- ENDO LASER;  Surgeon: Sherlynn Stalls, MD;  Location: [facility];  Service: Ophthalmology;  Laterality: Right;  . POSTERIOR LUMBAR FUSION  ~ 1989   "fused 2 discs"  . TIBIA FRACTURE SURGERY Left 1943   "put artificial bone in there"    Family History  Problem Relation  Age of Onset  . Hypertension Mother   . Hypertension Father   . Hypertension Other   . Colon cancer Other        1st degree relative <50    Social History   Tobacco Use  . Smoking status: Former Smoker    Years: 29.00    Types: Cigars  . Smokeless tobacco: Never Used  . Tobacco comment: quit smoking cigars in 1973  Substance Use Topics  . Alcohol use: Yes    Comment: "quit drinking in 1973"    Subjective:  Low back pain x 3 days; no known injury or trauma; symptoms worse with sitting for extended period of time/ bending makes pain worse; pain worse with changing positions; no numbness or tingling; no change in bowel or bladder habits; limited relief with Tylenol; on Plavix, cannot take NSAID; Has a working  farm-very active- 4 horses, 2 donkeys.   Objective:  Vitals:   10/01/18 1620  BP: 138/76  Pulse: 70  Temp: 97.8 F (36.6 C)  TempSrc: Oral  SpO2: 96%  Weight: 162 lb (73.5 kg)  Height: 5\' 6"  (1.676 m)    General: Well developed, well nourished, in no acute distress  Skin : Warm and dry.  Head: Normocephalic and atraumatic  Eyes: Sclera and conjunctiva clear; pupils round and reactive to light; extraocular movements intact  Ears: External normal; canals clear; tympanic membranes normal  Oropharynx: Pink, supple. No suspicious lesions  Neck: Supple without thyromegaly, adenopathy  Lungs: Respirations unlabored; Musculoskeletal: No deformities; no active joint inflammation  Extremities: No edema, cyanosis, clubbing  Vessels: Symmetric bilaterally  Neurologic: Alert and oriented; speech intact; face symmetrical; moves all extremities well; CNII-XII intact without focal deficit  Assessment:  1. Acute right-sided low back pain without sciatica     Plan:  Update lumbar spine X-ray; Depo- Medrol IM 40 mg given in office today; trial of Baclofen 10 mg tid prn; follow-up to be determined.  No follow-ups on file.  Orders Placed This Encounter  Procedures  . DG Lumbar  Spine 2-3 Views    Standing Status:   Future    Number of Occurrences:   1    Standing Expiration Date:   11/30/2019    Order Specific Question:   Reason for Exam (SYMPTOM  OR DIAGNOSIS REQUIRED)    Answer:   low back pain    Order Specific Question:   Preferred imaging location?    Answer:   [facility]    Order Specific Question:   Radiology Contrast Protocol - do NOT remove file path    Answer:   [email or web address]    Requested Prescriptions   Signed Prescriptions Disp Refills  . baclofen (LIORESAL) 10 MG tablet 20 each 0    Sig: Take 1 tablet (10 mg total) by mouth 3 (three) times daily.

## 2018-10-07 ENCOUNTER — Encounter: Payer: Self-pay | Admitting: Cardiology

## 2018-10-07 ENCOUNTER — Ambulatory Visit (INDEPENDENT_AMBULATORY_CARE_PROVIDER_SITE_OTHER): Payer: Medicare Other | Admitting: Cardiology

## 2018-10-07 VITALS — BP 112/54 | HR 70 | Ht 66.0 in | Wt 163.2 lb

## 2018-10-07 DIAGNOSIS — I110 Hypertensive heart disease with heart failure: Secondary | ICD-10-CM

## 2018-10-07 DIAGNOSIS — I5089 Other heart failure: Secondary | ICD-10-CM | POA: Diagnosis not present

## 2018-10-07 DIAGNOSIS — G4733 Obstructive sleep apnea (adult) (pediatric): Secondary | ICD-10-CM | POA: Diagnosis not present

## 2018-10-07 NOTE — Progress Notes (Signed)
Cardiology Office Note:    Date:  10/07/2018   ID:  [name] [name], DOB 1928-12-29, MRN [id number]  PCP:  Cassandria Anger, MD  Cardiologist:  Mertie Moores, MD    Referring MD: Cassandria Anger, MD   Chief Complaint  Patient presents with  . Sleep Apnea    History of Present Illness:    [name] [name] is a 85 y.o. male with a hx of  severe OSA with an AHI of 38.5/hr and is now on BiPAP.  He is doing well with his PAP device and thinks that he has gotten used to it.  He tolerates the mask and feels the pressure is adequate.  Since going on PAP he feels rested in the am and has no significant daytime sleepiness.  He denies any significant mouth or nasal dryness or nasal congestion.  He does not think that he snores.     Past Medical History:  Diagnosis Date  . AICD (automatic cardioverter/defibrillator) present   . Arrhythmia   . Arthritis    "hands, legs" (03/26/2016)  . 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 Chronic systolic CHF (congestive heart failure) ([place])    a. Echo 9/14: Mild LVH, mild focal basal septal hypertrophy, EF 35-40%, anteroseptal HK, normal diastolic function, mild MR, mild LAE, PASP 34 mmHg  . Chronotropic incompetence    treated w/pacemaker, ICD 7.2008. This was placed after CPX done post 2008 cath. CPX showed only chronotropic incompetence  . History of blood transfusion 1969   "after getting MSO4; it destroys my corpuscles" (03/26/2016)  . History of colon polyps   . Hyperlipidemia   . Hypertension   . Migraine    "just before my bypass" (03/26/2016)  . Mitral regurgitation    mild  . Myocardial infarction ([place]) "several"  . Numbness and tingling of left arm and leg      improved after tx w/prednisone  . OSA (obstructive sleep apnea) 09/01/2016   Severe OSA with AHI 38/hr now on BiPAP at 21/17cm H2O  . Syncopal episodes    relative hypotension    Past Surgical History:  Procedure Laterality Date  . BACK SURGERY    . CARDIAC CATHETERIZATION  1980s-1990s X 4  . CARDIAC CATHETERIZATION N/A 03/26/2016   Procedure: Left Heart Cath and Cors/Grafts Angiography;  Surgeon: Burnell Blanks, MD;  Location: [facility];  Service: Cardiovascular;  Laterality: N/A;  . CARDIAC CATHETERIZATION N/A 03/26/2016   Procedure: Coronary Stent Intervention;  Surgeon: Burnell Blanks, MD;  Location: [facility];  Service: Cardiovascular;  Laterality: N/A;  . CARDIAC DEFIBRILLATOR PLACEMENT  2008  . CATARACT EXTRACTION W/ INTRAOCULAR LENS  IMPLANT, BILATERAL Bilateral 08/2013 - 09/2013   right - left   . CORONARY ANGIOPLASTY WITH STENT PLACEMENT  ~ 1992; 03/26/2016   "1 + 2"  . CORONARY ARTERY BYPASS GRAFT  1992   CABG "X4"  . CYSTOSCOPY  1980s  . GAS/FLUID EXCHANGE Right 06/18/2017   Procedure: GAS/FLUID EXCHANGE (C3F8);  Surgeon: Sherlynn Stalls, MD;  Location: [facility];  Service: Ophthalmology;  Laterality: Right;  . IMPLANTABLE CARDIOVERTER DEFIBRILLATOR (ICD) GENERATOR CHANGE N/A 06/21/2014  Procedure: ICD GENERATOR CHANGE;  Surgeon: Deboraha Sprang, MD;  Location: [facility];  Service: Cardiovascular;  Laterality: N/A;  . PARS PLANA VITRECTOMY Right 06/18/2017   Procedure: PARS PLANA VITRECTOMY WITH 25 GAUGE;  Surgeon: Sherlynn Stalls, MD;  Location: [facility];  Service: Ophthalmology;  Laterality: Right;  . PHOTOCOAGULATION WITH LASER Right 06/18/2017   Procedure: PHOTOCOAGULATION WITH LASER- ENDO LASER;  Surgeon: Sherlynn Stalls, MD;  Location: [facility];  Service: Ophthalmology;  Laterality: Right;  . POSTERIOR LUMBAR FUSION  ~ 1989   "fused 2 discs"  . TIBIA FRACTURE SURGERY Left 1943   "put artificial bone in there"    Current Medications: Current  Meds  Medication Sig  . atorvastatin (LIPITOR) 40 MG tablet TAKE 1 TABLET DAILY  . baclofen (LIORESAL) 10 MG tablet Take 1 tablet (10 mg total) by mouth 3 (three) times daily.  . carvedilol (COREG) 12.5 MG tablet Take 1 tablet (12.5 mg total) by mouth 2 (two) times daily.  . Cholecalciferol 1000 UNITS tablet Take 1,000 Units by mouth daily.    . clopidogrel (PLAVIX) 75 MG tablet Take 1 tablet (75 mg total) by mouth daily.  . furosemide (LASIX) 40 MG tablet Take 1 tablet (40 mg total) by mouth daily.  . isosorbide mononitrate (IMDUR) 60 MG 24 hr tablet Take 1 tablet (60 mg total) by mouth daily.  Marland Kitchen losartan (COZAAR) 100 MG tablet Take 0.5 tablets (50 mg total) by mouth daily.  . Multiple Vitamins-Minerals (PRESERVISION AREDS 2) CAPS Take 1 capsule by mouth daily.   . nitroGLYCERIN (NITROLINGUAL) 0.4 MG/SPRAY spray Place 1 spray under the tongue every 5 (five) minutes x 3 doses as needed for chest pain.  . potassium chloride (K-DUR) 10 MEQ tablet Take 1 tablet (10 mEq total) by mouth daily.     Allergies:   Morphine and Ramipril   Social History   Socioeconomic History  . Marital status: Married    Spouse name: Not on file  . Number of children: Not on file  . Years of education: Not on file  . Highest education level: Not on file  Occupational History  . Occupation: Retired  Scientific laboratory technician  . Financial resource strain: Not on file  . Food insecurity:    Worry: Not on file    Inability: Not on file  . Transportation needs:    Medical: Not on file    Non-medical: Not on file  Tobacco Use  . Smoking status: Former Smoker    Years: 29.00    Types: Cigars  . Smokeless tobacco: Never Used  . Tobacco comment: quit smoking cigars in 1973  Substance and Sexual Activity  . Alcohol use: Yes    Comment: "quit drinking in 1973"  . Drug use: No  . Sexual activity: Yes  Lifestyle  . Physical activity:    Days per week: Not on file    Minutes per session: Not on file  . Stress: Not on  file  Relationships  . Social connections:    Talks on phone: Not on file    Gets together: Not on file    Attends religious service: Not on file    Active member of club or organization: Not on file    Attends meetings of clubs or organizations: Not on file    Relationship status: Not on file  Other Topics Concern  . Not on file  Social History Narrative   Widowed - 06/2010     Family History: The patient's family  history includes Colon cancer in an other family member; Hypertension in his father, mother, and another family member.  ROS:   Please see the history of present illness.    ROS  All other systems reviewed and negative.   EKGs/Labs/Other Studies Reviewed:    The following studies were reviewed today: PAP download  EKG:  EKG is not ordered today.    Recent Labs: 11/25/2017: ALT 27 04/02/2018: Hemoglobin 11.5; Platelets 266.0 09/21/2018: BUN 26; Creatinine, Ser 1.23; Potassium 4.4; Sodium 135   Recent Lipid Panel    Component Value Date/Time   CHOL 91 (L) 11/09/2017 1052   TRIG 121 11/09/2017 1052   HDL 35 (L) 11/09/2017 1052   CHOLHDL 2.6 11/09/2017 1052   CHOLHDL 4 12/24/2016 1103   VLDL 41.8 (H) 12/24/2016 1103   LDLCALC 32 11/09/2017 1052   LDLDIRECT 35.0 12/24/2016 1103    Physical Exam:    VS:  BP (!) 112/54   Pulse 70   Ht 5\' 6"  (1.676 m)   Wt 163 lb 3.2 oz (74 kg)   SpO2 95%   BMI 26.34 kg/m     Wt Readings from Last 3 Encounters:  10/07/18 163 lb 3.2 oz (74 kg)  10/01/18 162 lb (73.5 kg)  09/03/18 168 lb 12.8 oz (76.6 kg)     GEN:  Well nourished, well developed in no acute distress HEENT: Normal NECK: No JVD; No carotid bruits LYMPHATICS: No lymphadenopathy CARDIAC: RRR, no murmurs, rubs, gallops RESPIRATORY:  Clear to auscultation without rales, wheezing or rhonchi  ABDOMEN: Soft, non-tender, non-distended MUSCULOSKELETAL:  No edema; No deformity  SKIN: Warm and dry NEUROLOGIC:  Alert and oriented x 3 PSYCHIATRIC:  Normal  affect   ASSESSMENT:    1. OSA (obstructive sleep apnea)   2. Hypertensive heart disease with other congestive heart failure ([place])    PLAN:    In order of problems listed above:  1.  OSA - the patient is tolerating PAP therapy well without any problems. The PAP download was reviewed today and showed an AHI of 2.1/hr on 20/16 cm H2O with 99% compliance in using more than 4 hours nightly.  The patient has been using and benefiting from PAP use and will continue to benefit from therapy.   2.  HTN - BP is controlled one exam today.  He will continue on Carvedilol 12.5mg  BID and Losartan 50mg  BID   Medication Adjustments/Labs and Tests Ordered: Current medicines are reviewed at length with the patient today.  Concerns regarding medicines are outlined above.  No orders of the defined types were placed in this encounter.  No orders of the defined types were placed in this encounter.   Signed, Fransico Him, MD  10/07/2018 1:55 PM    [place]

## 2018-10-07 NOTE — Patient Instructions (Signed)
Medication Instructions:  Your physician recommends that you continue on your current medications as directed. Please refer to the Current Medication list given to you today.  If you need a refill on your cardiac medications before your next appointment, please call your pharmacy.   Lab work: None If you have labs (blood work) drawn today and your tests are completely normal, you will receive your results only by: . MyChart Message (if you have MyChart) OR . A paper copy in the mail If you have any lab test that is abnormal or we need to change your treatment, we will call you to review the results.  Testing/Procedures: None  Follow-Up: At [facility], you and your health needs are our priority.  As part of our continuing mission to provide you with exceptional heart care, we have created designated Provider Care Teams.  These Care Teams include your primary Cardiologist (physician) and Advanced Practice Providers (APPs -  Physician Assistants and Nurse Practitioners) who all work together to provide you with the care you need, when you need it. You will need a follow up appointment in 1 years.  Please call our office 2 months in advance to schedule this appointment.  You may see Dr. Turner or one of the following Advanced Practice Providers on your designated Care Team:     

## 2018-11-01 ENCOUNTER — Ambulatory Visit (INDEPENDENT_AMBULATORY_CARE_PROVIDER_SITE_OTHER): Payer: Medicare Other

## 2018-11-01 DIAGNOSIS — I255 Ischemic cardiomyopathy: Secondary | ICD-10-CM

## 2018-11-02 LAB — CUP PACEART REMOTE DEVICE CHECK
Battery Remaining Longevity: 46 mo
Battery Remaining Percentage: 54 %
Brady Statistic AP VS Percent: 89 %
Brady Statistic RA Percent Paced: 91 %
Brady Statistic RV Percent Paced: 3.2 %
HIGH POWER IMPEDANCE MEASURED VALUE: 70 Ohm
HIGH POWER IMPEDANCE MEASURED VALUE: 70 Ohm
Implantable Lead Implant Date: 20080711
Implantable Lead Location: 753859
Implantable Lead Model: 7122
Implantable Pulse Generator Implant Date: 20151007
Lead Channel Impedance Value: 360 Ohm
Lead Channel Impedance Value: 390 Ohm
Lead Channel Pacing Threshold Amplitude: 0.75 V
Lead Channel Pacing Threshold Pulse Width: 0.5 ms
Lead Channel Sensing Intrinsic Amplitude: 1.9 mV
Lead Channel Setting Pacing Amplitude: 2.5 V
Lead Channel Setting Pacing Pulse Width: 0.5 ms
MDC IDC LEAD IMPLANT DT: 20080711
MDC IDC LEAD LOCATION: 753860
MDC IDC MSMT BATTERY VOLTAGE: 2.92 V
MDC IDC MSMT LEADCHNL RA PACING THRESHOLD AMPLITUDE: 0.625 V
MDC IDC MSMT LEADCHNL RA PACING THRESHOLD PULSEWIDTH: 0.5 ms
MDC IDC MSMT LEADCHNL RV SENSING INTR AMPL: 12 mV
MDC IDC SESS DTM: 20200217070022
MDC IDC SET LEADCHNL RA PACING AMPLITUDE: 1.625
MDC IDC SET LEADCHNL RV SENSING SENSITIVITY: 0.5 mV
MDC IDC STAT BRADY AP VP PERCENT: 3.2 %
MDC IDC STAT BRADY AS VP PERCENT: 1 %
MDC IDC STAT BRADY AS VS PERCENT: 7.5 %
Pulse Gen Serial Number: 7225183

## 2018-11-10 NOTE — Progress Notes (Signed)
Remote ICD transmission.   

## 2018-11-12 DIAGNOSIS — G4733 Obstructive sleep apnea (adult) (pediatric): Secondary | ICD-10-CM | POA: Diagnosis not present

## 2018-12-06 ENCOUNTER — Ambulatory Visit: Payer: Medicare Other | Admitting: Cardiovascular Disease

## 2018-12-14 ENCOUNTER — Ambulatory Visit: Payer: Medicare Other

## 2018-12-27 ENCOUNTER — Ambulatory Visit: Payer: Medicare Other | Admitting: Cardiology

## 2019-01-06 ENCOUNTER — Telehealth: Payer: Self-pay | Admitting: Internal Medicine

## 2019-01-06 MED ORDER — IRBESARTAN 150 MG PO TABS: 150.0000 mg | ORAL_TABLET | Freq: Every day | ORAL | 3 refills | Status: DC

## 2019-01-06 NOTE — Telephone Encounter (Signed)
[facility] mail order pharmacy requesting a alternative for losartan 100 mg tablet, stating that this medication is out of stock and losartan 50 mg tablet are also having availability issues and is not a viable option at this time. Pharmacy would like Dr. Caryl Comes to send in an alternative for this medication. INV# [id number], call back number  [phone]. Please address

## 2019-01-06 NOTE — Telephone Encounter (Signed)
Losartan has been discontinued. Irbasartan, 150mg  tablet qd has been ordered and sent to pt's pharmacy.

## 2019-01-06 NOTE — Telephone Encounter (Signed)
We can use irbesartan 150 mg as a substitute for losartan 100

## 2019-01-12 ENCOUNTER — Telehealth: Payer: Self-pay | Admitting: Cardiovascular Disease

## 2019-01-12 NOTE — Telephone Encounter (Signed)
Spoke with patient who confirmed all demographics. Patient does not have a computer or smart phone.  Will have vitals ready for visit.    Virtual Visit Pre-Appointment Phone Call  "(Name), I am calling you today to discuss your upcoming appointment. We are currently trying to limit exposure to the virus that causes COVID-19 by seeing patients at home rather than in the office."  1. "What is the BEST phone number to call the day of the visit?" - include this in appointment notes  2. Do you have or have access to (through a family member/friend) a smartphone with video capability that we can use for your visit?" a. If yes - list this number in appt notes as cell (if different from BEST phone #) and list the appointment type as a VIDEO visit in appointment notes b. If no - list the appointment type as a PHONE visit in appointment notes  Confirm consent - "In the setting of the current Covid19 crisis, you are scheduled for a (phone or video) visit with your provider on (date) at (time).  Just as we do with many in-office visits, in order for you to participate in this visit, we must obtain consent.  If you'd like, I can send this to your mychart (if signed up) or email for you to review.  Otherwise, I can obtain your verbal consent now.  All virtual visits are billed to your insurance company just like a normal visit would be.  By agreeing to a virtual visit, we'd like you to understand that the technology does not allow for your provider to perform an examination, and thus may limit your provider's ability to fully assess your condition. If your provider identifies any concerns that need to be evaluated in person, we will make arrangements to do so.  Finally, though the technology is pretty good, we cannot assure that it will always work on either your or our end, and in the setting of a video visit, we may have to convert it to a phone-only visit.  In either situation, we cannot ensure that we have a  secure connection.  Are you willing to proceed?"  Patient said "yes".  3. Advise patient to be prepared - "Two hours prior to your appointment, go ahead and check your blood pressure, pulse, oxygen saturation, and your weight (if you have the equipment to check those) and write them all down. When your visit starts, your provider will ask you for this information. If you have an Apple Watch or Kardia device, please plan to have heart rate information ready on the day of your appointment. Please have a pen and paper handy nearby the day of the visit as well."  4. Give patient instructions for MyChart download to smartphone OR Doximity/Doxy.me as below if video visit (depending on what platform provider is using)  5. Inform patient they will receive a phone call 15 minutes prior to their appointment time (may be from unknown caller ID) so they should be prepared to answer    TELEPHONE CALL NOTE  [name] Curia. has been deemed a candidate for a follow-up tele-health visit to limit community exposure during the Covid-19 pandemic. I spoke with the patient via phone to ensure availability of phone/video source, confirm preferred email & phone number, and discuss instructions and expectations.  I reminded [name] Schack. to be prepared with any vital sign and/or heart rhythm information that could potentially be obtained via home monitoring, at the time  of his visit. I reminded [name] Curia. to expect a phone call prior to his visit.  Ardelle Anton 01/12/2019 4:25 PM   INSTRUCTIONS FOR DOWNLOADING THE MYCHART APP TO SMARTPHONE  - The patient must first make sure to have activated MyChart and know their login information - If Apple, go to [employer] and type in MyChart in the search bar and download the app. If Android, ask patient to go to [employer] and type in [place] in the search bar and download the app. The app is free but as with any other app downloads, their phone may require  them to verify saved payment information or Apple/Android password.  - The patient will need to then log into the app with their MyChart username and password, and select [facility] as their healthcare provider to link the account. When it is time for your visit, go to the MyChart app, find appointments, and click Begin Video Visit. Be sure to Select Allow for your device to access the Microphone and Camera for your visit. You will then be connected, and your provider will be with you shortly.  **If they have any issues connecting, or need assistance please contact MyChart service desk [phone])**  **If using a computer, in order to ensure the best quality for their visit they will need to use either of the following Internet Browsers: [employer], or Google Chrome**  IF USING DOXIMITY or DOXY.ME - The patient will receive a link just prior to their visit by text.     FULL LENGTH CONSENT FOR TELE-HEALTH VISIT   I hereby voluntarily request, consent and authorize [facility] and its employed or contracted physicians, physician assistants, nurse practitioners or other licensed health care professionals (the Practitioner), to provide me with telemedicine health care services (the Services") as deemed necessary by the treating Practitioner. I acknowledge and consent to receive the Services by the Practitioner via telemedicine. I understand that the telemedicine visit will involve communicating with the Practitioner through live audiovisual communication technology and the disclosure of certain medical information by electronic transmission. I acknowledge that I have been given the opportunity to request an in-person assessment or other available alternative prior to the telemedicine visit and am voluntarily participating in the telemedicine visit.  I understand that I have the right to withhold or withdraw my consent to the use of telemedicine in the course of my care at any  time, without affecting my right to future care or treatment, and that the Practitioner or I may terminate the telemedicine visit at any time. I understand that I have the right to inspect all information obtained and/or recorded in the course of the telemedicine visit and may receive copies of available information for a reasonable fee.  I understand that some of the potential risks of receiving the Services via telemedicine include:   Delay or interruption in medical evaluation due to technological equipment failure or disruption;  Information transmitted may not be sufficient (e.g. poor resolution of images) to allow for appropriate medical decision making by the Practitioner; and/or   In rare instances, security protocols could fail, causing a breach of personal health information.  Furthermore, I acknowledge that it is my responsibility to provide information about my medical history, conditions and care that is complete and accurate to the best of my ability. I acknowledge that Practitioner's advice, recommendations, and/or decision may be based on factors not within their control, such as incomplete or inaccurate data provided  by me or distortions of diagnostic images or specimens that may result from electronic transmissions. I understand that the practice of medicine is not an exact science and that Practitioner makes no warranties or guarantees regarding treatment outcomes. I acknowledge that I will receive a copy of this consent concurrently upon execution via email to the email address I last provided but may also request a printed copy by calling the office of [facility].    I understand that my insurance will be billed for this visit.   I have read or had this consent read to me.  I understand the contents of this consent, which adequately explains the benefits and risks of the Services being provided via telemedicine.   I have been provided ample opportunity to ask questions  regarding this consent and the Services and have had my questions answered to my satisfaction.  I give my informed consent for the services to be provided through the use of telemedicine in my medical care  By participating in this telemedicine visit I agree to the above.

## 2019-01-13 ENCOUNTER — Encounter

## 2019-01-14 DIAGNOSIS — G4733 Obstructive sleep apnea (adult) (pediatric): Secondary | ICD-10-CM | POA: Diagnosis not present

## 2019-01-25 ENCOUNTER — Encounter: Payer: Self-pay | Admitting: Cardiovascular Disease

## 2019-01-25 ENCOUNTER — Telehealth (INDEPENDENT_AMBULATORY_CARE_PROVIDER_SITE_OTHER): Payer: Medicare Other | Admitting: Cardiovascular Disease

## 2019-01-25 ENCOUNTER — Other Ambulatory Visit: Payer: Self-pay

## 2019-01-25 VITALS — BP 106/53 | HR 78 | Ht 66.0 in | Wt 162.0 lb

## 2019-01-25 DIAGNOSIS — I5022 Chronic systolic (congestive) heart failure: Secondary | ICD-10-CM

## 2019-01-25 DIAGNOSIS — E785 Hyperlipidemia, unspecified: Secondary | ICD-10-CM

## 2019-01-25 DIAGNOSIS — Z95810 Presence of automatic (implantable) cardiac defibrillator: Secondary | ICD-10-CM

## 2019-01-25 DIAGNOSIS — I2510 Atherosclerotic heart disease of native coronary artery without angina pectoris: Secondary | ICD-10-CM

## 2019-01-25 DIAGNOSIS — I10 Essential (primary) hypertension: Secondary | ICD-10-CM

## 2019-01-25 MED ORDER — IRBESARTAN 150 MG PO TABS: 75.0000 mg | ORAL_TABLET | Freq: Every day | ORAL | 3 refills | Status: DC

## 2019-01-25 NOTE — Progress Notes (Signed)
Virtual Visit via Telephone Note   This visit type was conducted due to national recommendations for restrictions regarding the COVID-19 Pandemic (e.g. social distancing) in an effort to limit this patient's exposure and mitigate transmission in our community.  Due to his co-morbid illnesses, this patient is at least at moderate risk for complications without adequate follow up.  This format is felt to be most appropriate for this patient at this time.  The patient did not have access to video technology/had technical difficulties with video requiring transitioning to audio format only (telephone).  All issues noted in this document were discussed and addressed.  No physical exam could be performed with this format.  Please refer to the patient's chart for his  consent to telehealth for [facility].   Date:  01/25/2019   ID:  [name] [name]., DOB 11/27/1928, MRN [id number]  Patient Location: Home Provider Location: Home  PCP:  Cassandria Anger, MD  Cardiologist:  Mertie Moores, MD  Electrophysiologist:  Virl Axe, MD   Evaluation Performed:  Follow-Up Visit  Chief Complaint:  Follow up CAD, CBG, CHF, hyperlipidemia   Problem list 1. Coronary artery disease-status post coronary artery bypass grafting 2. Hyperlipidemia 3. Chronic systolic congestive heart failure - EF 35-40% 4. ICD -  5.  [facility] - Fransico Him, MD      Chief Complaint  Patient presents with  . Coronary Artery Disease      Notes prior to 2016  [name] [name] is a 85 y.o. male who presents today to follow-up coronary disease and ischemic cardiomyopathy and a history of hypertension. He did have an episode of syncope in the past that was related to hypotension. More recently his blood pressure has been elevated. He was hesitant to have his ramapril dose increased because he associated this with his syncopal episode in the past. Losartan was added to his medications. He is getting excellent control and I  have left [name] on both. He feels good. There is no chest pain. He is fully active. He has an ICD in place that is followed carefully by electrophysiology.  Retired [employer] [employer] ( [place]   Nov. 1, 2016:  Doing well.  No CP or dyspnea. 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   January 08, 2016:  See for follow up for his CAD / CABG, chronic systolic CHF , HTN Doing well.    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   Stays active  Does not ride anymore.  Has some fatigue , no CP   Jan. 4, 2018:  Still taking care of his horses.   Feeds and hay twice  No CP , no dyspnea.   Aug. 29, 2018:  Mr. [name] is doing well from a cardiac standpoint  Has leg pain . 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 25, 2019:  Doing well .  Gets fatigued when he goes down to take care of the horses.  No CP or dyspnea .  Some DOE with working   May 25, 2018: Seen for follow  Still having some chest pain .   Lasted 30 minutes.    Took NTG with relief. Chest tightness with activity Has known severe CAD  With patent LIMA. Had 2 DES stents to his ramus Int. In 2017.  His symptoms are not nearly as severe as they  were in 2017 when he was sent to the hospital and had stents placed in the ramus intermediate branch.  September 03, 2018: [name] [name] is seen today for follow-up of his coronary artery disease He has had an ICD placed for primary prevention. Was having some dyspnea,   Dr. Caryl Comes increased Imdur to 60  Mg a day which has helped.    Jan 25, 2019    [name] [name]. is a 85 y.o. male with  CAD, CABG, Chronic combined CHF, ICD   Over all doing well  Still taking care of his 2 horsed and 2 donkeys. Goes to grocery store once a week.  Wears a mask  No CP Breathing is ok    The patient does not have symptoms concerning for COVID-19 infection (fever, chills, cough, or new shortness of breath).    Past  Medical History:  Diagnosis Date  . AICD (automatic cardioverter/defibrillator) present   . Arrhythmia   . Arthritis    "hands, legs" (03/26/2016)  . 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 Chronic systolic CHF (congestive heart failure) ([place])    a. Echo 9/14: Mild LVH, mild focal basal septal hypertrophy, EF 35-40%, anteroseptal HK, normal diastolic function, mild MR, mild LAE, PASP 34 mmHg  . Chronotropic incompetence    treated w/pacemaker, ICD 7.2008. This was placed after CPX done post 2008 cath. CPX showed only chronotropic incompetence  . History of blood transfusion 1969   "after getting MSO4; it destroys my corpuscles" (03/26/2016)  . History of colon polyps   . Hyperlipidemia   . Hypertension   . Migraine    "just before my bypass" (03/26/2016)  . Mitral regurgitation    mild  . Myocardial infarction ([place]) "several"  . Numbness and tingling of left arm and leg    improved after tx w/prednisone  . OSA (obstructive sleep apnea) 09/01/2016   Severe OSA with AHI 38/hr now on BiPAP at 21/17cm H2O  . Syncopal episodes    relative hypotension   Past Surgical History:  Procedure Laterality Date  . BACK SURGERY    . CARDIAC CATHETERIZATION  1980s-1990s X 4  . CARDIAC CATHETERIZATION N/A 03/26/2016   Procedure: Left Heart Cath and Cors/Grafts Angiography;  Surgeon: Burnell Blanks, MD;  Location: [place] [facility];  Service: Cardiovascular;  Laterality: N/A;  . CARDIAC CATHETERIZATION N/A 03/26/2016   Procedure: Coronary Stent Intervention;  Surgeon: Burnell Blanks, MD;  Location: [place] [facility];  Service: Cardiovascular;  Laterality: N/A;  . CARDIAC DEFIBRILLATOR PLACEMENT  2008  .  CATARACT EXTRACTION W/ INTRAOCULAR LENS  IMPLANT, BILATERAL Bilateral 08/2013 - 09/2013   right - left   . CORONARY ANGIOPLASTY WITH STENT PLACEMENT  ~ 1992; 03/26/2016   "1 + 2"  . CORONARY ARTERY BYPASS GRAFT  1992   CABG "X4"  . CYSTOSCOPY  1980s  . GAS/FLUID EXCHANGE Right 06/18/2017   Procedure: GAS/FLUID EXCHANGE (C3F8);  Surgeon: Sherlynn Stalls, MD;  Location: [place];  Service: Ophthalmology;  Laterality: Right;  . IMPLANTABLE CARDIOVERTER DEFIBRILLATOR (ICD) GENERATOR CHANGE N/A 06/21/2014   Procedure: ICD GENERATOR CHANGE;  Surgeon: Deboraha Sprang, MD;  Location: [facility] C[facility]  LAB;  Service: Cardiovascular;  Laterality: N/A;  . PARS PLANA VITRECTOMY Right 06/18/2017   Procedure: PARS PLANA VITRECTOMY WITH 25 GAUGE;  Surgeon: Sherlynn Stalls, MD;  Location: [place];  Service: Ophthalmology;  Laterality: Right;  . PHOTOCOAGULATION WITH LASER Right 06/18/2017   Procedure: PHOTOCOAGULATION WITH LASER- ENDO LASER;  Surgeon: Sherlynn Stalls, MD;  Location: [place];  Service: Ophthalmology;  Laterality: Right;  . POSTERIOR LUMBAR FUSION  ~ 1989   "fused 2 discs"  . TIBIA FRACTURE SURGERY Left 1943   "put artificial bone in there"     Current Meds  Medication Sig  . atorvastatin (LIPITOR) 40 MG tablet TAKE 1 TABLET DAILY  . baclofen (LIORESAL) 10 MG tablet Take 1 tablet (10 mg total) by mouth 3 (three) times daily.  . carvedilol (COREG) 12.5 MG tablet Take 1 tablet (12.5 mg total) by mouth 2 (two) times daily.  . Cholecalciferol 1000 UNITS tablet Take 1,000 Units by mouth daily.    . clopidogrel (PLAVIX) 75 MG tablet Take 1 tablet (75 mg total) by mouth daily.  . furosemide (LASIX) 40 MG tablet Take 1 tablet (40 mg total) by mouth daily.  . isosorbide mononitrate (IMDUR) 60 MG 24 hr tablet Take 1 tablet (60 mg total) by mouth daily.  . Multiple Vitamins-Minerals (PRESERVISION AREDS 2) CAPS Take 1 capsule by mouth daily.   . nitroGLYCERIN (NITROLINGUAL) 0.4 MG/SPRAY spray Place 1 spray under the tongue  every 5 (five) minutes x 3 doses as needed for chest pain.  . potassium chloride (K-DUR) 10 MEQ tablet Take 1 tablet (10 mEq total) by mouth daily.  . [DISCONTINUED] irbesartan (AVAPRO) 150 MG tablet Take 1 tablet (150 mg total) by mouth daily.     Allergies:   Morphine and Ramipril   Social History   Tobacco Use  . Smoking status: Former Smoker    Years: 29.00    Types: Cigars  . Smokeless tobacco: Never Used  . Tobacco comment: quit smoking cigars in 1973  Substance Use Topics  . Alcohol use: Yes    Comment: "quit drinking in 1973"  . Drug use: No     Family Hx: The patient's family history includes Colon cancer in an other family member; Hypertension in his father, mother, and another family member.  ROS:   Please see the history of present illness.     All other systems reviewed and are negative.   Prior CV studies:   The following studies were reviewed today:    Labs/Other Tests and Data Reviewed:    EKG:  No ECG reviewed.  Recent Labs: 04/02/2018: Hemoglobin 11.5; Platelets 266.0 09/21/2018: BUN 26; Creatinine, Ser 1.23; Potassium 4.4; Sodium 135   Recent Lipid Panel Lab Results  Component Value Date/Time   CHOL 91 (L) 11/09/2017 10:52 AM   TRIG 121 11/09/2017 10:52 AM   HDL 35 (L) 11/09/2017 10:52 AM   CHOLHDL 2.6 11/09/2017 10:52 AM   CHOLHDL 4 12/24/2016 11:03 AM   LDLCALC 32 11/09/2017 10:52 AM   LDLDIRECT 35.0 12/24/2016 11:03 AM    Wt Readings from Last 3 Encounters:  01/25/19 162 lb (73.5 kg)  10/07/18 163 lb 3.2 oz (74 kg)  10/01/18 162 lb (73.5 kg)     Objective:    Vital Signs:  BP (!) 106/53 (BP Location: Left Arm, Patient Position: Sitting, Cuff Size: Normal)   Pulse 78   Ht 5\' 6"  (1.676 m)   Wt 162 lb (73.5 kg)   BMI 26.15 kg/m  No exam except for VS - tele medicine visit   ASSESSMENT & PLAN:     1. Coronary artery disease-status post coronary artery bypass grafting - no angina ,  Seems to be doing  2. Hyperlipidemia-    Last labs are from Feb. 2019. On Atorva 40  Will need some lab work in 6 months when   3. Chronic systolic congestive heart failure - EF 35-40% Changed from Losartan to Irbesartan due to back order Will reduce Irbesartan to 75 a day due to low BP    4. ICD -  5.  [facility] - Fransico Him, MD  COVID-19 Education: The signs and symptoms of COVID-19 were discussed with the patient and how to seek care for testing (follow up with PCP or arrange E-visit).  The importance of social distancing was discussed today.  Time:   Today, I have spent  17  minutes with the patient with telehealth technology discussing the above problems.     Medication Adjustments/Labs and Tests Ordered: Current medicines are reviewed at length with the patient today.  Concerns regarding medicines are outlined above.   Tests Ordered: Orders Placed This Encounter  Procedures  . Lipid Profile  . Basic Metabolic Panel (BMET)  . Hepatic function panel    Medication Changes: Meds ordered this encounter  Medications  . irbesartan (AVAPRO) 150 MG tablet    Sig: Take 0.5 tablets (75 mg total) by mouth daily. Take 1/2 of your 150 mg tablets daily    Dispense:  45 tablet    Refill:  3    Disposition:  Follow up in 6 month(s)  Signed, Mertie Moores, MD  01/25/2019 10:39 AM    [facility]

## 2019-01-25 NOTE — Patient Instructions (Signed)
Medication Instructions:  Your physician has recommended you make the following change in your medication:  DECREASE Irbesartan (Avapro) to 75 mg daily (take 1/2 of your 150 mg tablet)  If you need a refill on your cardiac medications before your next appointment, please call your pharmacy.    Lab work: Your physician recommends that you return for lab work in: 6 months on the day of or a few days before your office visit with Dr. Acie Fredrickson.  You will need to FAST for this appointment - nothing to eat or drink after midnight the night before except water.    Testing/Procedures: None Ordered    Follow-Up: At [facility], you and your health needs are our priority.  As part of our continuing mission to provide you with exceptional heart care, we have created designated Provider Care Teams.  These Care Teams include your primary Cardiologist (physician) and Advanced Practice Providers (APPs -  Physician Assistants and Nurse Practitioners) who all work together to provide you with the care you need, when you need it. You will need a follow up appointment in:  6 months.  Please call our office 2 months in advance to schedule this appointment.  You may see Mertie Moores, MD or one of the following Advanced Practice Providers on your designated Care Team: Richardson Dopp, PA-C [place], [place] . Daune Perch, NP

## 2019-01-31 ENCOUNTER — Other Ambulatory Visit: Payer: Self-pay

## 2019-01-31 ENCOUNTER — Ambulatory Visit (INDEPENDENT_AMBULATORY_CARE_PROVIDER_SITE_OTHER): Payer: Medicare Other | Admitting: *Deleted

## 2019-01-31 DIAGNOSIS — I255 Ischemic cardiomyopathy: Secondary | ICD-10-CM | POA: Diagnosis not present

## 2019-02-01 LAB — CUP PACEART REMOTE DEVICE CHECK
Date Time Interrogation Session: 20200519103848
Implantable Lead Implant Date: 20080711
Implantable Lead Implant Date: 20080711
Implantable Lead Location: 753859
Implantable Lead Location: 753860
Implantable Lead Model: 7122
Implantable Pulse Generator Implant Date: 20151007
Pulse Gen Serial Number: 7225183

## 2019-02-04 ENCOUNTER — Ambulatory Visit (INDEPENDENT_AMBULATORY_CARE_PROVIDER_SITE_OTHER): Payer: Medicare Other | Admitting: *Deleted

## 2019-02-04 VITALS — BP 128/78 | HR 69 | Resp 17 | Ht 68.0 in | Wt 165.0 lb

## 2019-02-04 DIAGNOSIS — Z0000 Encounter for general adult medical examination without abnormal findings: Secondary | ICD-10-CM

## 2019-02-04 NOTE — Patient Instructions (Addendum)
Continue doing brain stimulating activities (puzzles, reading, adult coloring books, staying active) to keep memory sharp.   Continue to eat heart healthy diet (full of fruits, vegetables, whole grains, lean protein, water--limit salt, fat, and sugar intake) and increase physical activity as tolerated.   [name] , Thank you for taking time to come for your Medicare Wellness Visit. I appreciate your ongoing commitment to your health goals. Please review the following plan we discussed and let me know if I can assist you in the future.   These are the goals we discussed: Goals    . Patient Stated     Maintain current health status, enjoy traveling when I can.       This is a list of the screening recommended for you and due dates:  Health Maintenance  Topic Date Due  . Flu Shot  04/16/2019  . Tetanus Vaccine  04/18/2024  . Pneumonia vaccines  Completed    Preventive Care 4 Years and Older, Male Preventive care refers to lifestyle choices and visits with your health care provider that can promote health and wellness. What does preventive care include?   A yearly physical exam. This is also called an annual well check.  Dental exams once or twice a year.  Routine eye exams. Ask your health care provider how often you should have your eyes checked.  Personal lifestyle choices, including: ? Daily care of your teeth and gums. ? Regular physical activity. ? Eating a healthy diet. ? Avoiding tobacco and drug use. ? Limiting alcohol use. ? Practicing safe sex. ? Taking low doses of aspirin every day. ? Taking vitamin and mineral supplements as recommended by your health care provider. What happens during an annual well check? The services and screenings done by your health care provider during your annual well check will depend on your age, overall health, lifestyle risk factors, and family history of disease. Counseling Your health care provider may ask you questions about  your:  Alcohol use.  Tobacco use.  Drug use.  Emotional well-being.  Home and relationship well-being.  Sexual activity.  Eating habits.  History of falls.  Memory and ability to understand (cognition).  Work and work Statistician. Screening You may have the following tests or measurements:  Height, weight, and BMI.  Blood pressure.  Lipid and cholesterol levels. These may be checked every 5 years, or more frequently if you are over 72 years old.  Skin check.  Lung cancer screening. You may have this screening every year starting at age 63 if you have a 30-pack-year history of smoking and currently smoke or have quit within the past 15 years.  Colorectal cancer screening. All adults should have this screening starting at age 58 and continuing until age 42. You will have tests every 1-10 years, depending on your results and the type of screening test. People at increased risk should start screening at an earlier age. Screening tests may include: ? Guaiac-based fecal occult blood testing. ? Fecal immunochemical test (FIT). ? Stool DNA test. ? Virtual colonoscopy. ? Sigmoidoscopy. During this test, a flexible tube with a tiny camera (sigmoidoscope) is used to examine your rectum and lower colon. The sigmoidoscope is inserted through your anus into your rectum and lower colon. ? Colonoscopy. During this test, a long, thin, flexible tube with a tiny camera (colonoscope) is used to examine your entire colon and rectum.  Prostate cancer screening. Recommendations will vary depending on your family history and other risks.  Hepatitis  C blood test.  Hepatitis B blood test.  Sexually transmitted disease (STD) testing.  Diabetes screening. This is done by checking your blood sugar (glucose) after you have not eaten for a while (fasting). You may have this done every 1-3 years.  Abdominal aortic aneurysm (AAA) screening. You may need this if you are a current or former  smoker.  Osteoporosis. You may be screened starting at age 2 if you are at high risk. Talk with your health care provider about your test results, treatment options, and if necessary, the need for more tests. Vaccines Your health care provider may recommend certain vaccines, such as:  Influenza vaccine. This is recommended every year.  Tetanus, diphtheria, and acellular pertussis (Tdap, Td) vaccine. You may need a Td booster every 10 years.  Varicella vaccine. You may need this if you have not been vaccinated.  Zoster vaccine. You may need this after age 83.  Measles, mumps, and rubella (MMR) vaccine. You may need at least one dose of MMR if you were born in 1957 or later. You may also need a second dose.  Pneumococcal 13-valent conjugate (PCV13) vaccine. One dose is recommended after age 45.  Pneumococcal polysaccharide (PPSV23) vaccine. One dose is recommended after age 47.  Meningococcal vaccine. You may need this if you have certain conditions.  Hepatitis A vaccine. You may need this if you have certain conditions or if you travel or work in places where you may be exposed to hepatitis A.  Hepatitis B vaccine. You may need this if you have certain conditions or if you travel or work in places where you may be exposed to hepatitis B.  Haemophilus influenzae type b (Hib) vaccine. You may need this if you have certain risk factors. Talk to your health care provider about which screenings and vaccines you need and how often you need them. This information is not intended to replace advice given to you by your health care provider. Make sure you discuss any questions you have with your health care provider. Document Released: 09/28/2015 Document Revised: 10/22/2017 Document Reviewed: 07/03/2015 Elsevier Interactive Patient Education  2019 [employer].

## 2019-02-04 NOTE — Progress Notes (Addendum)
Subjective:   [name] [name]. is a 85 y.o. male who presents for Medicare Annual/Subsequent preventive examination.  Review of Systems:   Cardiac Risk Factors include: advanced age (>77men, >25 women);dyslipidemia;male gender;hypertension Sleep patterns: feels rested on waking, gets up 0-1 times nightly to void and sleeps 6 hours nightly.    Home Safety/Smoke Alarms: Feels safe in home. Smoke alarms in place.  Living environment; residence and Firearm Safety: 1-story house/ trailer. Lives with wife, no needs for DME, good support system Seat Belt Safety/Bike Helmet: Wears seat belt.   PSA-  Lab Results  Component Value Date   PSA 3.11 12/24/2016   PSA 2.17 07/29/2013   PSA 1.96 07/07/2012       Objective:    Vitals: BP 128/78   Pulse 69   Resp 17   Ht 5\' 8"  (1.727 m)   Wt 165 lb (74.8 kg)   SpO2 98%   BMI 25.09 kg/m   Body mass index is 25.09 kg/m.  Advanced Directives 02/04/2019 11/25/2017 06/26/2016 05/11/2016 05/03/2016 03/26/2016 06/21/2014  Does Patient Have a Medical Advance Directive? Yes Yes Yes Yes Yes Yes Yes  Type of Paramedic of [place];Living will [place];Living will [place];Living will;Advance instruction for mental health treatment [place];Living will;Advance instruction for mental health treatment [place];Living will Living will [place];Living will  Does patient want to make changes to medical advance directive? - - No - Patient declined No - Patient declined - No - Patient declined No - Patient declined  Copy of [place] in Chart? No - copy requested - No - copy requested No - copy requested - No - copy requested No - copy requested    Tobacco Social History   Tobacco Use  Smoking Status Former Smoker  . Years: 29.00  . Types: Cigars  Smokeless Tobacco Never Used  Tobacco Comment   quit smoking cigars in  1973     Counseling given: Not Answered Comment: quit smoking cigars in 1973  Past Medical History:  Diagnosis Date  . AICD (automatic cardioverter/defibrillator) present   . Arrhythmia   . Arthritis    "hands, legs" (03/26/2016)  . 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 Chronic systolic CHF (congestive heart failure) ([place])    a. Echo 9/14: Mild LVH, mild focal basal septal hypertrophy, EF 35-40%, anteroseptal HK, normal diastolic function, mild MR, mild LAE, PASP 34 mmHg  . Chronotropic incompetence    treated w/pacemaker, ICD 7.2008. This was placed after CPX done post 2008 cath. CPX showed only chronotropic incompetence  . History of blood transfusion 1969   "after getting MSO4; it destroys my corpuscles" (03/26/2016)  . History of colon polyps   . Hyperlipidemia   . Hypertension   . Migraine    "just before my bypass" (03/26/2016)  . Mitral regurgitation    mild  . Myocardial infarction ([place]) "several"  . Numbness and tingling of left arm and leg    improved after tx w/prednisone  . OSA (obstructive sleep apnea) 09/01/2016   Severe OSA with AHI 38/hr now on BiPAP  at 21/17cm H2O  . Syncopal episodes    relative hypotension   Past Surgical History:  Procedure Laterality Date  . BACK SURGERY    . CARDIAC CATHETERIZATION  1980s-1990s X 4  . CARDIAC CATHETERIZATION N/A 03/26/2016   Procedure: Left Heart Cath and Cors/Grafts Angiography;  Surgeon: Burnell Blanks, MD;  Location: [facility];  Service: Cardiovascular;  Laterality: N/A;  . CARDIAC CATHETERIZATION N/A 03/26/2016   Procedure: Coronary Stent Intervention;  Surgeon: Burnell Blanks, MD;  Location: [facility];   Service: Cardiovascular;  Laterality: N/A;  . CARDIAC DEFIBRILLATOR PLACEMENT  2008  . CATARACT EXTRACTION W/ INTRAOCULAR LENS  IMPLANT, BILATERAL Bilateral 08/2013 - 09/2013   right - left   . CORONARY ANGIOPLASTY WITH STENT PLACEMENT  ~ 1992; 03/26/2016   "1 + 2"  . CORONARY ARTERY BYPASS GRAFT  1992   CABG "X4"  . CYSTOSCOPY  1980s  . GAS/FLUID EXCHANGE Right 06/18/2017   Procedure: GAS/FLUID EXCHANGE (C3F8);  Surgeon: Sherlynn Stalls, MD;  Location: [facility];  Service: Ophthalmology;  Laterality: Right;  . IMPLANTABLE CARDIOVERTER DEFIBRILLATOR (ICD) GENERATOR CHANGE N/A 06/21/2014   Procedure: ICD GENERATOR CHANGE;  Surgeon: Deboraha Sprang, MD;  [facility];  Service: Cardiovascular;  Laterality: N/A;  . PARS PLANA VITRECTOMY Right 06/18/2017   Procedure: PARS PLANA VITRECTOMY WITH 25 GAUGE;  Surgeon: Sherlynn Stalls, MD;  Location: [facility];  Service: Ophthalmology;  Laterality: Right;  . PHOTOCOAGULATION WITH LASER Right 06/18/2017   Procedure: PHOTOCOAGULATION WITH LASER- ENDO LASER;  Surgeon: Sherlynn Stalls, MD;  Location: [facility];  Service: Ophthalmology;  Laterality: Right;  . POSTERIOR LUMBAR FUSION  ~ 1989   "fused 2 discs"  . TIBIA FRACTURE SURGERY Left 1943   "put artificial bone in there"   Family History  Problem Relation Age of Onset  . Hypertension Mother   . Hypertension Father   . Hypertension Other   . Colon cancer Other        1st degree relative <50   Social History   Socioeconomic History  . Marital status: Married    Spouse name: 1 child passed away  . Number of children: 4  . Years of education: Not on file  . Highest education level: Not on file  Occupational History  . Occupation: Retired  Scientific laboratory technician  . Financial resource strain: Not hard at all  . Food insecurity:    Worry: Never true    Inability: Never true  . Transportation needs:    Medical: No    Non-medical: No  Tobacco Use  . Smoking status: Former Smoker    Years: 29.00    Types:  Cigars  . Smokeless tobacco: Never Used  . Tobacco comment: quit smoking cigars in 1973  Substance and Sexual Activity  . Alcohol use: Yes    Comment: "quit drinking in 1973"  . Drug use: No  . Sexual activity: Yes  Lifestyle  . Physical activity:    Days per week: 5 days    Minutes per session: 60 min  . Stress: Not at all  Relationships  . Social connections:    Talks on phone: More than three times a week    Gets together: More than three times a week    Attends religious service: More than 4 times per year    Active member of club or organization: Yes    Attends meetings of clubs or organizations: More than 4 times per year  Relationship status: Married  Other Topics Concern  . Not on file  Social History Narrative   Widowed - 06/2010    Outpatient Encounter Medications as of 02/04/2019  Medication Sig  . atorvastatin (LIPITOR) 40 MG tablet TAKE 1 TABLET DAILY  . baclofen (LIORESAL) 10 MG tablet Take 1 tablet (10 mg total) by mouth 3 (three) times daily.  . carvedilol (COREG) 12.5 MG tablet Take 1 tablet (12.5 mg total) by mouth 2 (two) times daily.  . Cholecalciferol 1000 UNITS tablet Take 1,000 Units by mouth daily.    . clopidogrel (PLAVIX) 75 MG tablet Take 1 tablet (75 mg total) by mouth daily.  . furosemide (LASIX) 40 MG tablet Take 1 tablet (40 mg total) by mouth daily.  . irbesartan (AVAPRO) 150 MG tablet Take 0.5 tablets (75 mg total) by mouth daily. Take 1/2 of your 150 mg tablets daily  . isosorbide mononitrate (IMDUR) 60 MG 24 hr tablet Take 1 tablet (60 mg total) by mouth daily.  . Multiple Vitamins-Minerals (PRESERVISION AREDS 2) CAPS Take 1 capsule by mouth daily.   . nitroGLYCERIN (NITROLINGUAL) 0.4 MG/SPRAY spray Place 1 spray under the tongue every 5 (five) minutes x 3 doses as needed for chest pain.  . potassium chloride (K-DUR) 10 MEQ tablet Take 1 tablet (10 mEq total) by mouth daily.   No facility-administered encounter medications on file as of  02/04/2019.     Activities of Daily Living In your present state of health, do you have any difficulty performing the following activities: 02/04/2019  Hearing? N  Vision? N  Difficulty concentrating or making decisions? N  Walking or climbing stairs? N  Dressing or bathing? N  Doing errands, shopping? N  Preparing Food and eating ? N  Using the Toilet? N  In the past six months, have you accidently leaked urine? N  Do you have problems with loss of bowel control? N  Managing your Medications? N  Managing your Finances? N  Housekeeping or managing your Housekeeping? N  Some recent data might be hidden    Patient Care Team: Plotnikov, Evie Lacks, MD as PCP - General Nahser, Wonda Cheng, MD as PCP - Cardiology (Cardiology) Deboraha Sprang, MD as PCP - Electrophysiology (Cardiology)   Assessment:   This is a routine wellness examination for Christoph. Physical assessment deferred to PCP.  Exercise Activities and Dietary recommendations Current Exercise Habits: Home exercise routine, Type of exercise: walking(maintains a farm), Time (Minutes): 60, Frequency (Times/Week): 6, Weekly Exercise (Minutes/Week): 360, Intensity: Mild, Exercise limited by: None identified Diet (meal preparation, eat out, water intake, caffeinated beverages, dairy products, fruits and vegetables): in general, a "healthy" diet  , well balanced   Reviewed heart healthy diet. Encouraged patient to increase daily water and healthy fluid intake.  Goals    . Patient Stated     Maintain current health status, enjoy traveling when I can.       Fall Risk Fall Risk  02/04/2019 02/02/2018 12/24/2016 06/24/2016 05/04/2015  Falls in the past year? 0 No No No No  Number falls in past yr: 0 - - - -   Depression Screen PHQ 2/9 Scores 02/04/2019 02/02/2018 12/24/2016 06/24/2016  PHQ - 2 Score 0 0 0 0    Cognitive Function       Ad8 score reviewed for issues:  Issues making decisions: no  Less interest in hobbies /  activities: no  Repeats questions, stories (family complaining): no  Trouble using ordinary gadgets (microwave, computer, phone):no  Forgets the month or year: no  Mismanaging finances: no  Remembering appts: no  Daily problems with thinking and/or memory: no Ad8 score is= 0  Immunization History  Administered Date(s) Administered  . Influenza Inj Mdck Quad With Preservative 07/09/2018  . Influenza Split 07/13/2012  . Influenza, High Dose Seasonal PF 06/24/2016, 06/05/2017  . Influenza,inj,Quad PF,6+ Mos 06/15/2014  . Influenza-Unspecified 06/15/2013  . Pneumococcal Conjugate-13 09/24/2017  . Pneumococcal Polysaccharide-23 09/15/2005  . Tetanus 04/18/2014   Screening Tests Health Maintenance  Topic Date Due  . INFLUENZA VACCINE  04/16/2019  . TETANUS/TDAP  04/18/2024  . PNA vac Low Risk Adult  Completed       Plan:    Reviewed health maintenance screenings with patient today and relevant education, vaccines, and/or referrals were provided.   Continue doing brain stimulating activities (puzzles, reading, adult coloring books, staying active) to keep memory sharp.   Continue to eat heart healthy diet (full of fruits, vegetables, whole grains, lean protein, water--limit salt, fat, and sugar intake) and increase physical activity as tolerated.  I have personally reviewed and noted the following in the patient's chart:   . Medical and social history . Use of alcohol, tobacco or illicit drugs  . Current medications and supplements . Functional ability and status . Nutritional status . Physical activity . Advanced directives . Vital signs . List of other physicians . Screenings to include cognitive, depression, and falls . Referrals and appointments  In addition, I have reviewed and discussed with patient certain preventive protocols, quality metrics, and best practice recommendations. A written personalized care plan for preventive services as well as general  preventive health recommendations were provided to patient.     Michiel Cowboy, RN  02/04/2019  Medical screening examination/treatment/procedure(s) were performed by non-physician practitioner and as supervising physician I was immediately available for consultation/collaboration. I agree with above. Lew Dawes, MD

## 2019-02-10 ENCOUNTER — Encounter: Payer: Self-pay | Admitting: Cardiology

## 2019-02-10 NOTE — Progress Notes (Signed)
Remote ICD transmission.   

## 2019-03-04 ENCOUNTER — Telehealth: Payer: Self-pay | Admitting: Cardiovascular Disease

## 2019-03-04 NOTE — Telephone Encounter (Signed)
Spoke with pt. Scheduled w/ Dr. Acie Fredrickson on 6/23. Pt appreciative. He has been prescreened, will wear a mask.  His wife will stay in car.  Reports to me has been getting out of breath and chest tightness when he puts his clothes on in the morning or out feeing the animals.   This is not new but has been worsening over the last month, maybe 2 months... Also his legs feel heavy and they swell.  Has taken NTG on two different occassions for chest tightness and this has helped both times. Continues lasix, states urinates all day long. Denies using salt or eating a high sodium diet.  Aware to call office anytime between now and his appointment if symptoms worsen or do not go away.

## 2019-03-04 NOTE — Telephone Encounter (Signed)
New Message     Pt c/o Shortness Of Breath: STAT if SOB developed within the last 24 hours or pt is noticeably SOB on the phone  1. Are you currently SOB (can you hear that pt is SOB on the phone)? Yes, cant really hear over the phone  2. How long have you been experiencing SOB? 2-3 weeks   3. Are you SOB when sitting or when up moving around? Mostly when up moving around   4. Are you currently experiencing any other symptoms? Legs feel heavy, and some tightness in his chest  Pt would like an in office visit

## 2019-03-08 ENCOUNTER — Ambulatory Visit (INDEPENDENT_AMBULATORY_CARE_PROVIDER_SITE_OTHER): Payer: Medicare Other | Admitting: Cardiovascular Disease

## 2019-03-08 ENCOUNTER — Encounter: Payer: Self-pay | Admitting: Cardiovascular Disease

## 2019-03-08 ENCOUNTER — Other Ambulatory Visit: Payer: Self-pay

## 2019-03-08 VITALS — BP 130/70 | HR 70 | Ht 67.0 in | Wt 160.4 lb

## 2019-03-08 DIAGNOSIS — I2510 Atherosclerotic heart disease of native coronary artery without angina pectoris: Secondary | ICD-10-CM

## 2019-03-08 DIAGNOSIS — E785 Hyperlipidemia, unspecified: Secondary | ICD-10-CM | POA: Diagnosis not present

## 2019-03-08 DIAGNOSIS — I5022 Chronic systolic (congestive) heart failure: Secondary | ICD-10-CM

## 2019-03-08 NOTE — Progress Notes (Signed)
Cardiology Office Note   Date:  03/08/2019   ID:  [name] [name]., DOB July 30, 1929, MRN [id number]  PCP:  Cassandria Anger, MD  Cardiologist:  Mertie Moores, MD   Problem list 1. Coronary artery disease-status post coronary artery bypass grafting 2. Hyperlipidemia 3. Chronic systolic congestive heart failure - EF 35-40% 4. ICD -  5.  OSA - [name] Him, MD   Chief Complaint  Patient presents with  . Coronary Artery Disease  . Congestive Heart Failure      Notes prior to 2016  [name] [name]. is a 85 y.o. male who presents today to follow-up coronary disease and ischemic cardiomyopathy and a history of hypertension. He did have an episode of syncope in the past that was related to hypotension. More recently his blood pressure has been elevated. He was hesitant to have his ramapril dose increased because he associated this with his syncopal episode in the past. Losartan was added to his medications. He is getting excellent control and I have left [name] on both. He feels good. There is no chest pain. He is fully active. He has an ICD in place that is followed carefully by electrophysiology.  Retired [employer] ( [place] )   Nov. 1, 2016:  Doing well.  No CP or dyspnea. 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   January 08, 2016:  See for follow up for his CAD / CABG, chronic systolic CHF , HTN Doing well.    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   Stays active  Does not ride anymore.  Has some fatigue , no CP   Jan. 4, 2018:  Still taking care of his horses.   Feeds and hay twice  No CP , no dyspnea.   Aug. 29, 2018:  [name] [name] is doing well from a cardiac standpoint  Has leg pain . 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 25, 2019:  Doing well .  Gets fatigued when he goes down to take care of the horses.  No CP or dyspnea .  Some DOE with working   May 25, 2018: Seen for  follow  Still having some chest pain .   Lasted 30 minutes.    Took NTG with relief. Chest tightness with activity Has known severe CAD  With patent LIMA. Had 2 DES stents to his ramus Int. In 2017.  His symptoms are not nearly as severe as they were in 2017 when he was sent to the hospital and had stents placed in the ramus intermediate branch.  September 03, 2018: [name] [name] is seen today for follow-up of his coronary artery disease He has had an ICD placed for primary prevention. Was having some dyspnea,   Dr. Caryl Comes increased Imdur to 60  Mg a day which has helped.   Has swelling   March 08, 2019 :  I saw [name] as a telemedicine visit on Jan 25, 2019 .  Having more chest pressure with exertion .  Has marked DOE , when dressing  Legs feel heavy ,  Has leg edema Avoids salty foods.  Takes NTG which has helped the chest tightness.  The IMdur has helped.    Past Medical History:  Diagnosis Date  . AICD (automatic cardioverter/defibrillator) present   . Arrhythmia   . Arthritis    "hands, legs" (03/26/2016)  .  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 Chronic systolic CHF (congestive heart failure) ([place])    a. Echo 9/14: Mild LVH, mild focal basal septal hypertrophy, EF 35-40%, anteroseptal HK, normal diastolic function, mild MR, mild LAE, PASP 34 mmHg  . Chronotropic incompetence    treated w/pacemaker, ICD 7.2008. This was placed after CPX done post 2008 cath. CPX showed only chronotropic incompetence  . History of blood transfusion 1969   "after getting MSO4; it destroys my corpuscles" (03/26/2016)  . History of colon polyps   . Hyperlipidemia   . Hypertension   . Migraine    "just before my bypass"  (03/26/2016)  . Mitral regurgitation    mild  . Myocardial infarction ([place]) "several"  . Numbness and tingling of left arm and leg    improved after tx w/prednisone  . OSA (obstructive sleep apnea) 09/01/2016   Severe OSA with AHI 38/hr now on BiPAP at 21/17cm H2O  . Syncopal episodes    relative hypotension    Past Surgical History:  Procedure Laterality Date  . BACK SURGERY    . CARDIAC CATHETERIZATION  1980s-1990s X 4  . CARDIAC CATHETERIZATION N/A 03/26/2016   Procedure: Left Heart Cath and Cors/Grafts Angiography;  Surgeon: Burnell Blanks, MD;  Location: [facility];  Service: Cardiovascular;  Laterality: N/A;  . CARDIAC CATHETERIZATION N/A 03/26/2016   Procedure: Coronary Stent Intervention;  Surgeon: Burnell Blanks, MD;  Location: [place];  Service: Cardiovascular;  Laterality: N/A;  . CARDIAC DEFIBRILLATOR PLACEMENT  2008  . CATARACT EXTRACTION W/ INTRAOCULAR LENS  IMPLANT, BILATERAL Bilateral 08/2013 - 09/2013   right - left   . CORONARY ANGIOPLASTY WITH STENT PLACEMENT  ~ 1992; 03/26/2016   "1 + 2"  . CORONARY ARTERY BYPASS GRAFT  1992   CABG "X4"  . CYSTOSCOPY  1980s  . GAS/FLUID EXCHANGE Right 06/18/2017   Procedure: GAS/FLUID EXCHANGE (C3F8);  Surgeon: Sherlynn Stalls, MD;  Location: [place];  Service: Ophthalmology;  Laterality: Right;  . IMPLANTABLE CARDIOVERTER DEFIBRILLATOR (ICD) GENERATOR CHANGE N/A 06/21/2014   Procedure: ICD GENERATOR CHANGE;  Surgeon: Deboraha Sprang, MD;  Location: [facility];  Service: Cardiovascular;  Laterality: N/A;  . PARS PLANA VITRECTOMY Right 06/18/2017   Procedure: PARS PLANA VITRECTOMY WITH 25 GAUGE;  Surgeon: Sherlynn Stalls, MD;  Location: [facility];  Service: Ophthalmology;  Laterality: Right;  . PHOTOCOAGULATION WITH LASER Right 06/18/2017   Procedure: PHOTOCOAGULATION WITH LASER- ENDO LASER;  Surgeon: Sherlynn Stalls, MD;  Location: [facility];  Service: Ophthalmology;  Laterality: Right;  . POSTERIOR LUMBAR FUSION   ~ 1989   "fused 2 discs"  . TIBIA FRACTURE SURGERY Left 1943   "put artificial bone in there"    Patient Active Problem List   Diagnosis Date Noted  . Chronic systolic CHF (congestive heart failure) ([place]) 11/18/2013    Priority: High  . CAD (coronary artery disease)     Priority: High  . Varicose veins of both lower extremities without ulcer or inflammation 02/02/2018  . Allergic rhinitis 12/29/2017  . HLD (hyperlipidemia) 11/09/2017  . Left lateral epicondylitis 07/02/2017  . Knee pain, right 12/24/2016  . OSA (obstructive  sleep apnea) 09/01/2016  . Status post coronary artery stent placement   . Hypertensive heart disease with heart failure ([place])   . Unstable angina ([place])   . Thumb pain 05/04/2015  . Ejection fraction   . Well adult exam 07/13/2012  . Chronic renal insufficiency, stage III (moderate) ([place]) 07/21/2011  . Ischemic cardiomyopathy   . Hx of CABG   . Chronotropic incompetence   . Syncopal episodes   . Mitral regurgitation   . Numbness and tingling of left arm and leg   . Arm pain, diffuse, left 10/22/2009  . PARESTHESIA 10/22/2009  . TOBACCO USE, QUIT 10/22/2009  . SEBACEOUS CYST 08/27/2009  . Cough 02/28/2009  . Anemia, chronic disease 02/18/2008  . Dyslipidemia 05/03/2007  . Hypertensive heart disease 05/03/2007  . COLONIC POLYPS, HX OF 05/03/2007  . Automatic implantable cardioverter-defibrillator in situ 03/16/2007      Current Outpatient Medications  Medication Sig Dispense Refill  . atorvastatin (LIPITOR) 40 MG tablet TAKE 1 TABLET DAILY 90 tablet 3  . baclofen (LIORESAL) 10 MG tablet Take 1 tablet (10 mg total) by mouth 3 (three) times daily. 20 each 0  . carvedilol (COREG) 12.5 MG tablet Take 1 tablet (12.5 mg total) by mouth 2 (two) times daily. 180 tablet 3  . Cholecalciferol 1000 UNITS tablet Take 1,000 Units by mouth daily.      . clopidogrel (PLAVIX) 75 MG tablet Take 1 tablet (75 mg total) by mouth daily. 90 tablet 4  . furosemide (LASIX)  40 MG tablet Take 1 tablet (40 mg total) by mouth daily. 30 tablet 11  . irbesartan (AVAPRO) 150 MG tablet Take 0.5 tablets (75 mg total) by mouth daily. Take 1/2 of your 150 mg tablets daily 45 tablet 3  . isosorbide mononitrate (IMDUR) 60 MG 24 hr tablet Take 1 tablet (60 mg total) by mouth daily. 90 tablet 3  . Multiple Vitamins-Minerals (PRESERVISION AREDS 2) CAPS Take 1 capsule by mouth daily.     . nitroGLYCERIN (NITROLINGUAL) 0.4 MG/SPRAY spray Place 1 spray under the tongue every 5 (five) minutes x 3 doses as needed for chest pain. 12 g 3  . potassium chloride (K-DUR) 10 MEQ tablet Take 1 tablet (10 mEq total) by mouth daily. 30 tablet 11   No current facility-administered medications for this visit.     Allergies:   Morphine and Ramipril    Social History:  The patient  reports that he has quit smoking. His smoking use included cigars. He quit after 29.00 years of use. He has never used smokeless tobacco. He reports current alcohol use. He reports that he does not use drugs.   Family History:  The patient's family history includes Colon cancer in an other family member; Hypertension in his father, mother, and another family member.    ROS: Noted in current history, otherwise review of systems is negative.   Physical Exam: Blood pressure 130/70, pulse 70, height 5\' 7"  (1.702 m), weight 160 lb 6.4 oz (72.8 kg), SpO2 98 %.  GEN:  Elderly male,  NAD  HEENT: Normal NECK: No JVD; No carotid bruits LYMPHATICS: No lymphadenopathy CARDIAC: RR,  2-3 / 6 systolic murmur .  RESPIRATORY:  Clear to auscultation without rales, wheezing or rhonchi  ABDOMEN: Soft, non-tender, non-distended MUSCULOSKELETAL:  2+ bilateral calf / ankle edema .  SKIN: Warm and dry NEUROLOGIC:  Alert and oriented x 3   EKG:      Recent Labs: 04/02/2018: Hemoglobin 11.5; Platelets 266.0 09/21/2018: BUN 26;  Creatinine, Ser 1.23; Potassium 4.4; Sodium 135    Lipid Panel    Component Value Date/Time   CHOL  91 (L) 11/09/2017 1052   TRIG 121 11/09/2017 1052   HDL 35 (L) 11/09/2017 1052   CHOLHDL 2.6 11/09/2017 1052   CHOLHDL 4 12/24/2016 1103   VLDL 41.8 (H) 12/24/2016 1103   LDLCALC 32 11/09/2017 1052   LDLDIRECT 35.0 12/24/2016 1103      Wt Readings from Last 3 Encounters:  03/08/19 160 lb 6.4 oz (72.8 kg)  02/04/19 165 lb (74.8 kg)  01/25/19 162 lb (73.5 kg)      Current medicines are reviewed  The patient understands his medications.   ASSESSMENT AND PLAN:  1. Coronary artery disease-status post coronary artery bypass grafting he has only 1 of 3 grafts are patent-his LIMA to LAD.  He has chronic stable angina.     2. Hyperlipidemia -     Following, stable   3. Chronic systolic congestive heart failure -  Still getting too much salt . ( processed dinners, bojangles, soups  Advised salt restriction.  Will get an echo.  ( last one was July 2018)   4. ICD -  Managed by Dr. Caryl Comes   5. Obstructive sleep apnea -   Mertie Moores, MD  03/08/2019 11:55 AM    [facility] [place],  [address], [place]  [id number] Pager [phone] Phone: [phone]; Fax: [phone]

## 2019-03-08 NOTE — Patient Instructions (Addendum)
Medication Instructions:  INCREASE LASIX to 80 mg for 2 days only. Then resume Lasix 40 mg daily  Testing/Procedures: Your provider has requested that you have an echocardiogram. Echocardiography is a painless test that uses sound waves to create images of your heart. It provides your doctor with information about the size and shape of your heart and how well your heart's chambers and valves are working. This procedure takes approximately one hour. There are no restrictions for this procedure. You are scheduled for your echocardiogram Tuesday, March 05, 2019 at 2:05PM. Please arrive 15 minutes early to your appointment.    Follow-Up: You have a follow-up appointment with Dr. Elmarie Shiley assistant, Richardson Dopp, on May 25, 2019 at 10:45AM.    Low-Sodium Eating Plan Sodium, which is an element that makes up salt, helps you maintain a healthy balance of fluids in your body. Too much sodium can increase your blood pressure and cause fluid and waste to be held in your body. Your health care provider or dietitian may recommend following this plan if you have high blood pressure (hypertension), kidney disease, liver disease, or heart failure. Eating less sodium can help lower your blood pressure, reduce swelling, and protect your heart, liver, and kidneys. What are tips for following this plan? General guidelines  Most people on this plan should limit their sodium intake to 1,500-2,000 mg (milligrams) of sodium each day. Reading food labels   The Nutrition Facts label lists the amount of sodium in one serving of the food. If you eat more than one serving, you must multiply the listed amount of sodium by the number of servings.  Choose foods with less than 140 mg of sodium per serving.  Avoid foods with 300 mg of sodium or more per serving. Shopping  Look for lower-sodium products, often labeled as "low-sodium" or "no salt added."  Always check the sodium content even if foods are labeled as  "unsalted" or "no salt added".  Buy fresh foods. ? Avoid canned foods and premade or frozen meals. ? Avoid canned, cured, or processed meats  Buy breads that have less than 80 mg of sodium per slice. Cooking  Eat more home-cooked food and less restaurant, buffet, and fast food.  Avoid adding salt when cooking. Use salt-free seasonings or herbs instead of table salt or sea salt. Check with your health care provider or pharmacist before using salt substitutes.  Cook with plant-based oils, such as canola, sunflower, or olive oil. Meal planning  When eating at a restaurant, ask that your food be prepared with less salt or no salt, if possible.  Avoid foods that contain MSG (monosodium glutamate). MSG is sometimes added to [place] food, bouillon, and some canned foods. What foods are recommended? The items listed may not be a complete list. Talk with your dietitian about what dietary choices are best for you. Grains Low-sodium cereals, including oats, puffed wheat and rice, and shredded wheat. Low-sodium crackers. Unsalted rice. Unsalted pasta. Low-sodium bread. Whole-grain breads and whole-grain pasta. Vegetables Fresh or frozen vegetables. "No salt added" canned vegetables. "No salt added" tomato sauce and paste. Low-sodium or reduced-sodium tomato and vegetable juice. Fruits Fresh, frozen, or canned fruit. Fruit juice. Meats and other protein foods Fresh or frozen (no salt added) meat, poultry, seafood, and fish. Low-sodium canned tuna and salmon. Unsalted nuts. Dried peas, beans, and lentils without added salt. Unsalted canned beans. Eggs. Unsalted nut butters. Dairy Milk. Soy milk. Cheese that is naturally low in sodium, such as ricotta cheese, fresh  mozzarella, or Swiss cheese Low-sodium or reduced-sodium cheese. Cream cheese. Yogurt. Fats and oils Unsalted butter. Unsalted margarine with no trans fat. Vegetable oils such as canola or olive oils. Seasonings and other foods Fresh  and dried herbs and spices. Salt-free seasonings. Low-sodium mustard and ketchup. Sodium-free salad dressing. Sodium-free light mayonnaise. Fresh or refrigerated horseradish. Lemon juice. Vinegar. Homemade, reduced-sodium, or low-sodium soups. Unsalted popcorn and pretzels. Low-salt or salt-free chips. What foods are not recommended? The items listed may not be a complete list. Talk with your dietitian about what dietary choices are best for you. Grains Instant hot cereals. Bread stuffing, pancake, and biscuit mixes. Croutons. Seasoned rice or pasta mixes. Noodle soup cups. Boxed or frozen macaroni and cheese. Regular salted crackers. Self-rising flour. Vegetables Sauerkraut, pickled vegetables, and relishes. Olives. [place] fries. Onion rings. Regular canned vegetables (not low-sodium or reduced-sodium). Regular canned tomato sauce and paste (not low-sodium or reduced-sodium). Regular tomato and vegetable juice (not low-sodium or reduced-sodium). Frozen vegetables in sauces. Meats and other protein foods Meat or fish that is salted, canned, smoked, spiced, or pickled. Bacon, ham, sausage, hotdogs, corned beef, chipped beef, packaged lunch meats, salt pork, jerky, pickled herring, anchovies, regular canned tuna, sardines, salted nuts. Dairy Processed cheese and cheese spreads. Cheese curds. Blue cheese. Feta cheese. String cheese. Regular cottage cheese. Buttermilk. Canned milk. Fats and oils Salted butter. Regular margarine. Ghee. Bacon fat. Seasonings and other foods Onion salt, garlic salt, seasoned salt, table salt, and sea salt. Canned and packaged gravies. Worcestershire sauce. Tartar sauce. Barbecue sauce. Teriyaki sauce. Soy sauce, including reduced-sodium. Steak sauce. Fish sauce. Oyster sauce. Cocktail sauce. Horseradish that you find on the shelf. Regular ketchup and mustard. Meat flavorings and tenderizers. Bouillon cubes. Hot sauce and Tabasco sauce. Premade or packaged marinades. Premade  or packaged taco seasonings. Relishes. Regular salad dressings. Salsa. Potato and tortilla chips. Corn chips and puffs. Salted popcorn and pretzels. Canned or dried soups. Pizza. Frozen entrees and pot pies. Summary  Eating less sodium can help lower your blood pressure, reduce swelling, and protect your heart, liver, and kidneys.  Most people on this plan should limit their sodium intake to 1,500-2,000 mg (milligrams) of sodium each day.  Canned, boxed, and frozen foods are high in sodium. Restaurant foods, fast foods, and pizza are also very high in sodium. You also get sodium by adding salt to food.  Try to cook at home, eat more fresh fruits and vegetables, and eat less fast food, canned, processed, or prepared foods. This information is not intended to replace advice given to you by your health care provider. Make sure you discuss any questions you have with your health care provider. Document Released: 02/21/2002 Document Revised: 08/25/2016 Document Reviewed: 08/25/2016 Elsevier Interactive Patient Education  2019 [employer].

## 2019-03-14 ENCOUNTER — Telehealth (HOSPITAL_COMMUNITY): Payer: Self-pay

## 2019-03-14 NOTE — Telephone Encounter (Signed)

## 2019-03-15 ENCOUNTER — Other Ambulatory Visit: Payer: Self-pay

## 2019-03-15 ENCOUNTER — Ambulatory Visit (HOSPITAL_COMMUNITY): Payer: Medicare Other | Attending: Cardiology

## 2019-03-15 DIAGNOSIS — I5022 Chronic systolic (congestive) heart failure: Secondary | ICD-10-CM | POA: Diagnosis not present

## 2019-03-15 MED ORDER — PERFLUTREN LIPID MICROSPHERE
1.0000 mL | INTRAVENOUS | Status: AC | PRN
  Administered 2019-03-15: 1 mL via INTRAVENOUS

## 2019-04-14 DIAGNOSIS — G4733 Obstructive sleep apnea (adult) (pediatric): Secondary | ICD-10-CM | POA: Diagnosis not present

## 2019-05-02 ENCOUNTER — Ambulatory Visit (INDEPENDENT_AMBULATORY_CARE_PROVIDER_SITE_OTHER): Payer: Medicare Other | Admitting: *Deleted

## 2019-05-02 DIAGNOSIS — I255 Ischemic cardiomyopathy: Secondary | ICD-10-CM | POA: Diagnosis not present

## 2019-05-02 DIAGNOSIS — I5022 Chronic systolic (congestive) heart failure: Secondary | ICD-10-CM

## 2019-05-02 LAB — CUP PACEART REMOTE DEVICE CHECK
Date Time Interrogation Session: 20200817194454
Implantable Lead Implant Date: 20080711
Implantable Lead Implant Date: 20080711
Implantable Lead Location: 753859
Implantable Lead Location: 753860
Implantable Lead Model: 7122
Implantable Pulse Generator Implant Date: 20151007
Pulse Gen Serial Number: 7225183

## 2019-05-10 NOTE — Progress Notes (Signed)
Remote ICD transmission.   

## 2019-05-24 NOTE — Progress Notes (Signed)
Cardiology Office Note:    Date:  05/25/2019   ID:  [name] [name]., DOB 1929-03-08, MRN [id number]  PCP:  Cassandria Anger, MD  Cardiologist:  Mertie Moores, MD  Electrophysiologist:  Virl Axe, MD   Referring MD: Cassandria Anger, MD   Chief Complaint  Patient presents with   Follow-up    CHF, CAD    History of Present Illness:    [name] [name]. is a 85 y.o. male with:   Coronary artery disease   S/p CABG in 1992  [place] 7/17: L-LAD ok, S-Lat ramus + S-RCA 100, severe ostial RI dz >> PCI - DES x 2 to RI  Chronic systolic CHF  Ischemic CM   Echocardiogram 02/2019: EF 30-35, Gr 2 DD, moderate MR  Echocardiogram 7/18: EF 30-35, Gr 2 DD  Mild regurgitation  Chronotropic incompetence s/p pacer/ICD  Hx of syncope  NSVT  Hyperlipidemia   [name] [name] was last seen by Dr. Acie Fredrickson in June 2020.  He had a diet rich in salt at that time and was volume overloaded.  His diuretics were adjusted for a brief amount of time.  Follow-up echocardiogram demonstrated stable LV function with an EF of 30-35% and moderate diastolic dysfunction.  He has moderate mitral regurgitation.  He returns for follow-up.  He notes continued issues with shortness of breath with mild to moderate activities.  He sometimes notes shortness of breath when he is getting ready for the day.  He also notices shortness of breath when he is feeding his horses and donkeys.  He has not had orthopnea, paroxysmal nocturnal dyspnea.  He has been limiting salt in his swelling has decreased.  He uses CPAP at night.  He has chest discomfort described as pressure with certain activities.  This is relieved by rest and nitroglycerin.  His chest discomfort pattern has been stable over the last 2 to 3 years without significant change.  He has not had syncope or palpitations.  Prior CV studies:   The following studies were reviewed today:  Echocardiogram 03/15/2019 EF 30-35, mild LVH, Gr 2 DD, normal RVSF, mod LAE,  mild MAC, mod MR, trivial AI  Echocardiogram 03/30/2017  Mod to severe LVH, EF 30-35, Gr 2 DD, trivial AI, mild to mod MR, mild to mod reduced RVSF, mild increased PASP   [facility] 03/26/16 LAD mid 100% CTO RI ostial 99% LCx okay RCA proximal 100% LIMA-LAD okay SVG-lateral ramus 100% SVG-distal RCA 100% CTO EF 35-45% PCI: 2.25 x 28 and 2.25 x 16 mm Promus Premier DES to the RI 1. Severe triple vessel s/p 3V CABG with 1/3 patent bypass grafts.  2. Occluded mid LAD with patent LIMA to LAD 3. Severe stenosis in the proximal segment of the moderate caliber Ramus branch. The vein graft to this branch is now occluded.  4. Chronic occlusion mid RCA. The vein graft to this branch is known to be occluded.  5. Moderate LV systolic dysfunction, chronic.  6. Successful PTCA/DES x 2 Ramus intermediate branch.           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 Echo 9/14: Mild LVH, mild focal basal septal hypertrophy, EF 35-40%, anteroseptal HK, normal diastolic function, mild MR, mild LAE, PASP 34 mmHg   Chronotropic incompetence    treated w/pacemaker, ICD 7.2008. This was placed after CPX done post 2008 cath. CPX showed only chronotropic incompetence   History of blood transfusion 1969   "after getting MSO4; it destroys my corpuscles" (03/26/2016)   History  of colon polyps    Hyperlipidemia    Hypertension    Migraine    "just before my bypass" (03/26/2016)   Mitral regurgitation    mild   Myocardial infarction [facility]) "several"   Numbness and tingling of left arm and leg    improved after tx w/prednisone   OSA (obstructive sleep apnea) 09/01/2016   Severe OSA with AHI 38/hr now on BiPAP at 21/17cm H2O   Syncopal episodes    relative hypotension   Surgical Hx: The patient  has a past surgical history that includes Coronary artery bypass graft (1992); Cardiac catheterization (8502D-7412I X 4); Cardiac defibrillator placement (2008); Tibia fracture surgery (Left, 1943); Cataract extraction w/ intraocular lens  implant, bilateral (Bilateral, 08/2013 - 09/2013); implantable cardioverter defibrillator (icd) generator change (N/A, 06/21/2014); Back surgery; Posterior lumbar fusion (~ 1989); Coronary angioplasty with stent (~ 1992; 03/26/2016); Cystoscopy (1980s); Cardiac catheterization (N/A, 03/26/2016); Cardiac catheterization (N/A, 03/26/2016); Pars plana vitrectomy (Right, 06/18/2017); Gas/fluid exchange (Right, 06/18/2017); and Photocoagulation with laser (Right, 06/18/2017).   Current Medications: Current Meds  Medication Sig   atorvastatin (LIPITOR) 40 MG tablet TAKE 1 TABLET DAILY   baclofen (LIORESAL) 10 MG tablet Take 1 tablet (10 mg total) by mouth 3 (three) times daily.   carvedilol (COREG) 12.5 MG tablet Take 1 tablet (12.5 mg total) by mouth 2 (two) times daily.   Cholecalciferol 1000 UNITS tablet Take 1,000 Units by mouth daily.     clopidogrel (PLAVIX) 75 MG tablet Take 1 tablet (75 mg total) by mouth daily.   furosemide (LASIX) 40 MG tablet Take 1 tablet (40 mg total) by mouth daily.   irbesartan (AVAPRO) 150 MG tablet Take 0.5 tablets (75 mg total) by mouth daily. Take 1/2 of your 150 mg tablets daily   isosorbide mononitrate (IMDUR) 60 MG 24 hr tablet Take 1.5 tablets (90 mg total) by mouth daily.   Multiple  Vitamins-Minerals (PRESERVISION AREDS 2) CAPS Take 1 capsule by mouth daily.    nitroGLYCERIN (NITROLINGUAL) 0.4 MG/SPRAY spray Place 1 spray under the tongue every 5 (five) minutes x 3 doses as needed for chest pain.   potassium chloride (K-DUR) 10 MEQ tablet Take 1 tablet (10 mEq total) by mouth daily.   [DISCONTINUED] isosorbide mononitrate (IMDUR) 60 MG 24 hr tablet Take 1 tablet (60 mg total) by mouth daily.     Allergies:   Morphine and Ramipril   Social History   Tobacco Use   Smoking status: Former Smoker    Years: 29.00    Types: Cigars   Smokeless tobacco: Never Used   Tobacco comment: quit smoking cigars in 1973  Substance Use Topics   Alcohol use: Yes    Comment: "quit drinking in 1973"   Drug use: No  Family Hx: The patient's family history includes Colon cancer in an other family member; Hypertension in his father, mother, and another family member.  ROS:   Please see the history of present illness.    ROS All other systems reviewed and are negative.   EKGs/Labs/Other Test Reviewed:    EKG:  EKG is  ordered today.  The ekg ordered today demonstrates atrial paced, HR 70, left axis deviation, IVCD, anterior Q waves, inferior Q waves, <1 mm ST depression 1, aVL, V6, similar to prior tracings  Recent Labs: 05/25/2019: ALT 26; BUN 29; Creatinine, Ser 1.30; Hemoglobin 11.3; NT-Pro BNP 1,931; Platelets 229; Potassium 5.1; Sodium 137   Recent Lipid Panel Lab Results  Component Value Date/Time   CHOL 91 (L) 11/09/2017 10:52 AM   TRIG 121 11/09/2017 10:52 AM   HDL 35 (L) 11/09/2017 10:52 AM   CHOLHDL 2.6 11/09/2017 10:52 AM   CHOLHDL 4 12/24/2016 11:03 AM   LDLCALC 32 11/09/2017 10:52 AM   LDLDIRECT 35.0 12/24/2016 11:03 AM     Physical Exam:    VS:  BP (!) 112/50    Pulse 70    Ht _0  (1.702 m)    Wt 161 lb (73 kg)    SpO2 97%    BMI 25.22 kg/m     Wt Readings from Last 3 Encounters:  05/25/19 161 lb (73 kg)  03/08/19 160 lb 6.4 oz (72.8 kg)    02/04/19 165 lb (74.8 kg)     Physical Exam  Constitutional: He is oriented to person, place, and time. He appears well-developed and well-nourished. No distress.  HENT:  Head: Normocephalic and atraumatic.  Eyes: No scleral icterus.  Neck: No JVD present. No thyromegaly present.  Cardiovascular: Normal rate and regular rhythm.  Murmur heard.  Holosystolic murmur is present with a grade of 3/6 at the lower left sternal border radiating to the axilla. Pulmonary/Chest: Effort normal and breath sounds normal. He has no rales.  Abdominal: Soft. There is no hepatomegaly.  Musculoskeletal:        General: Edema (trace-1+ bilat LE edema) present.  Lymphadenopathy:    He has no cervical adenopathy.  Neurological: He is alert and oriented to person, place, and time.  Skin: Skin is warm and dry.  Psychiatric: He has a normal mood and affect.    ASSESSMENT & PLAN:    1. Chronic systolic CHF (congestive heart failure) ([place]) 2. Shortness of breath EF 30-35 by echocardiogram June 2020.  He has moderate diastolic dysfunction.  He describes NYHA 2b-3 symptoms.  On exam, he does not appear to be significantly volume overloaded.  He has some lower extremity swelling.  He also seems to have venous insufficiency with multiple varicose veins on exam.  I suspect his shortness of breath is more related to angina.  We could consider changing his ARB to [facility] or adding spironolactone.  For now, I will adjust his antianginal therapy and obtain labs.  He will be seen back in 4 to 6 weeks for early follow-up.  -Obtain CMET, BNP, CBC today  -Adjust Lasix if BNP significantly elevated  -Consider changing ARB to Entresto versus adding spironolactone at follow-up  3. Coronary artery disease involving native coronary artery of native heart without angina pectoris History of CABG in 1992.  2-3 bypass grafts were occluded at cardiac catheterization July 2017.  At that time, he underwent drug-eluting stent x2 to  the ostial ramus intermediate.  He has chronic angina.  His chest discomfort is  unchanged.  However, he does note increasing shortness of breath.  He does note improved symptoms after taking his isosorbide in the mornings.  Therefore, I will increase his isosorbide to 90 mg daily.  Continue atorvastatin, carvedilol, clopidogrel, as needed nitroglycerin.  Follow-up in 4 to 6 weeks.  If symptoms progress, consider proceeding with cardiac catheterization.  4. Essential hypertension The patient's blood pressure is controlled on his current regimen.  Continue current therapy.   5. Hyperlipidemia, unspecified hyperlipidemia type Continue statin therapy.  Obtain CMET today.  6. ICD (implantable cardioverter-defibrillator) in place 7. NSVT (nonsustained ventricular tachycardia) (HCC) Continue follow-up with EP.  Continue beta-blocker.    Dispo:  Return in about 6 weeks (around 07/06/2019) for Close Follow Up, w/ Dr. Acie Fredrickson, or Richardson Dopp, PA-C.   Medication Adjustments/Labs and Tests Ordered: Current medicines are reviewed at length with the patient today.  Concerns regarding medicines are outlined above.  Tests Ordered: Orders Placed This Encounter  Procedures   Basic Metabolic Panel (BMET)   Hepatic function panel   Pro b natriuretic peptide   CBC   EKG 12-Lead   Medication Changes: Meds ordered this encounter  Medications   isosorbide mononitrate (IMDUR) 60 MG 24 hr tablet    Sig: Take 1.5 tablets (90 mg total) by mouth daily.    Dispense:  120 tablet    Refill:  3    Signed, Richardson Dopp, PA-C  05/25/2019 6:00 PM    [facility] [place], [place], [place]  [address] Phone: [phone]; Fax: [phone]

## 2019-05-25 ENCOUNTER — Encounter: Payer: Self-pay | Admitting: Physician Assistant

## 2019-05-25 ENCOUNTER — Ambulatory Visit (INDEPENDENT_AMBULATORY_CARE_PROVIDER_SITE_OTHER): Payer: Medicare Other | Admitting: Physician Assistant

## 2019-05-25 ENCOUNTER — Other Ambulatory Visit: Payer: Self-pay

## 2019-05-25 VITALS — BP 112/50 | HR 70 | Ht 67.0 in | Wt 161.0 lb

## 2019-05-25 DIAGNOSIS — I10 Essential (primary) hypertension: Secondary | ICD-10-CM

## 2019-05-25 DIAGNOSIS — I2510 Atherosclerotic heart disease of native coronary artery without angina pectoris: Secondary | ICD-10-CM | POA: Diagnosis not present

## 2019-05-25 DIAGNOSIS — Z95810 Presence of automatic (implantable) cardiac defibrillator: Secondary | ICD-10-CM | POA: Diagnosis not present

## 2019-05-25 DIAGNOSIS — E785 Hyperlipidemia, unspecified: Secondary | ICD-10-CM

## 2019-05-25 DIAGNOSIS — I472 Ventricular tachycardia: Secondary | ICD-10-CM

## 2019-05-25 DIAGNOSIS — I4729 Other ventricular tachycardia: Secondary | ICD-10-CM

## 2019-05-25 DIAGNOSIS — R0602 Shortness of breath: Secondary | ICD-10-CM | POA: Diagnosis not present

## 2019-05-25 DIAGNOSIS — I5022 Chronic systolic (congestive) heart failure: Secondary | ICD-10-CM | POA: Diagnosis not present

## 2019-05-25 LAB — CBC
Hematocrit: 33.2 % — ABNORMAL LOW (ref 37.5–51.0)
Hemoglobin: 11.3 g/dL — ABNORMAL LOW (ref 13.0–17.7)
MCH: 29 pg (ref 26.6–33.0)
MCHC: 34 g/dL (ref 31.5–35.7)
MCV: 85 fL (ref 79–97)
Platelets: 229 10*3/uL (ref 150–450)
RBC: 3.9 x10E6/uL — ABNORMAL LOW (ref 4.14–5.80)
RDW: 13.8 % (ref 11.6–15.4)
WBC: 7.1 10*3/uL (ref 3.4–10.8)

## 2019-05-25 LAB — HEPATIC FUNCTION PANEL
ALT: 26 IU/L (ref 0–44)
AST: 20 IU/L (ref 0–40)
Albumin: 4.3 g/dL (ref 3.6–4.6)
Alkaline Phosphatase: 146 IU/L — ABNORMAL HIGH (ref 39–117)
Bilirubin Total: 0.6 mg/dL (ref 0.0–1.2)
Bilirubin, Direct: 0.24 mg/dL (ref 0.00–0.40)
Total Protein: 6.5 g/dL (ref 6.0–8.5)

## 2019-05-25 LAB — BASIC METABOLIC PANEL
BUN/Creatinine Ratio: 22 (ref 10–24)
BUN: 29 mg/dL — ABNORMAL HIGH (ref 8–27)
CO2: 24 mmol/L (ref 20–29)
Calcium: 9.4 mg/dL (ref 8.6–10.2)
Chloride: 102 mmol/L (ref 96–106)
Creatinine, Ser: 1.3 mg/dL — ABNORMAL HIGH (ref 0.76–1.27)
GFR calc Af Amer: 57 mL/min/{1.73_m2} — ABNORMAL LOW (ref >59)
GFR calc non Af Amer: 49 mL/min/{1.73_m2} — ABNORMAL LOW (ref >59)
Glucose: 113 mg/dL — ABNORMAL HIGH (ref 65–99)
Potassium: 5.1 mmol/L (ref 3.5–5.2)
Sodium: 137 mmol/L (ref 134–144)

## 2019-05-25 LAB — PRO B NATRIURETIC PEPTIDE: NT-Pro BNP: 1931 pg/mL — ABNORMAL HIGH (ref 0–486)

## 2019-05-25 MED ORDER — ISOSORBIDE MONONITRATE ER 60 MG PO TB24: 90.0000 mg | ORAL_TABLET | Freq: Every day | ORAL | 3 refills | Status: DC

## 2019-05-25 NOTE — Patient Instructions (Addendum)
Medication Instructions:  Your physician has recommended you make the following change in your medication:  1.  Increase imdur take one and one half tablet (90 mg ) daily.   Labwork: Your physician recommends that you have lab work today: bmet/lft/pro bnp/cbc.   Testing/Procedures: -None  Follow-Up: Your physician recommends that you keep your scheduled  follow-up appointment with Dr. Acie Fredrickson on Oct 13 @ 10:00 am.   Any Other Special Instructions Will Be Listed Below (If Applicable).  Please call elastic therapy store @ [phone] in [place] to get compression hose.    If you need a refill on your cardiac medications before your next appointment, please call your pharmacy.

## 2019-05-26 ENCOUNTER — Other Ambulatory Visit: Payer: Self-pay | Admitting: *Deleted

## 2019-05-26 ENCOUNTER — Telehealth: Payer: Self-pay | Admitting: Physician Assistant

## 2019-05-26 ENCOUNTER — Ambulatory Visit: Payer: Medicare Other | Admitting: Cardiovascular Disease

## 2019-05-26 DIAGNOSIS — R0602 Shortness of breath: Secondary | ICD-10-CM

## 2019-05-26 DIAGNOSIS — I2510 Atherosclerotic heart disease of native coronary artery without angina pectoris: Secondary | ICD-10-CM

## 2019-05-26 DIAGNOSIS — I5022 Chronic systolic (congestive) heart failure: Secondary | ICD-10-CM

## 2019-05-26 MED ORDER — FUROSEMIDE 40 MG PO TABS: 60.0000 mg | ORAL_TABLET | Freq: Every day | ORAL | 11 refills | Status: DC

## 2019-05-26 NOTE — Telephone Encounter (Signed)
New Message ° ° °Patient is returning call in reference to lab results.  °

## 2019-05-31 ENCOUNTER — Other Ambulatory Visit: Payer: Medicare Other

## 2019-05-31 ENCOUNTER — Ambulatory Visit (INDEPENDENT_AMBULATORY_CARE_PROVIDER_SITE_OTHER): Payer: Medicare Other | Admitting: Internal Medicine

## 2019-05-31 ENCOUNTER — Other Ambulatory Visit: Payer: Self-pay

## 2019-05-31 ENCOUNTER — Encounter: Payer: Self-pay | Admitting: Internal Medicine

## 2019-05-31 VITALS — BP 138/68 | HR 89 | Temp 97.5°F | Ht 67.0 in | Wt 159.0 lb

## 2019-05-31 DIAGNOSIS — R609 Edema, unspecified: Secondary | ICD-10-CM | POA: Insufficient documentation

## 2019-05-31 DIAGNOSIS — I5022 Chronic systolic (congestive) heart failure: Secondary | ICD-10-CM

## 2019-05-31 DIAGNOSIS — I2510 Atherosclerotic heart disease of native coronary artery without angina pectoris: Secondary | ICD-10-CM

## 2019-05-31 DIAGNOSIS — R0609 Other forms of dyspnea: Secondary | ICD-10-CM | POA: Diagnosis not present

## 2019-05-31 DIAGNOSIS — Z23 Encounter for immunization: Secondary | ICD-10-CM | POA: Diagnosis not present

## 2019-05-31 DIAGNOSIS — R600 Localized edema: Secondary | ICD-10-CM

## 2019-05-31 DIAGNOSIS — N2889 Other specified disorders of kidney and ureter: Secondary | ICD-10-CM

## 2019-05-31 DIAGNOSIS — N183 Chronic kidney disease, stage 3 unspecified: Secondary | ICD-10-CM

## 2019-05-31 DIAGNOSIS — R0602 Shortness of breath: Secondary | ICD-10-CM

## 2019-05-31 LAB — BASIC METABOLIC PANEL
BUN/Creatinine Ratio: 28 — ABNORMAL HIGH (ref 10–24)
BUN: 37 mg/dL — ABNORMAL HIGH (ref 8–27)
CO2: 23 mmol/L (ref 20–29)
Calcium: 9 mg/dL (ref 8.6–10.2)
Chloride: 101 mmol/L (ref 96–106)
Creatinine, Ser: 1.31 mg/dL — ABNORMAL HIGH (ref 0.76–1.27)
GFR calc Af Amer: 56 mL/min/{1.73_m2} — ABNORMAL LOW (ref >59)
GFR calc non Af Amer: 49 mL/min/{1.73_m2} — ABNORMAL LOW (ref >59)
Glucose: 125 mg/dL — ABNORMAL HIGH (ref 65–99)
Potassium: 5 mmol/L (ref 3.5–5.2)
Sodium: 136 mmol/L (ref 134–144)

## 2019-05-31 LAB — PRO B NATRIURETIC PEPTIDE: NT-Pro BNP: 1027 pg/mL — ABNORMAL HIGH (ref 0–486)

## 2019-05-31 NOTE — Assessment & Plan Note (Signed)
Doing  Lasix 60 mg/d SOB when feeding 2 donkeys and 2 horses

## 2019-05-31 NOTE — Assessment & Plan Note (Signed)
Creat elevation on Lasix - on 60 mg/d now Will monitor labs

## 2019-05-31 NOTE — Assessment & Plan Note (Signed)
ASA, Lipitor Isosorbide, Losartan, Coreg, Lasix 60 mg/d SOB when feeding 2 donkeys and 2 horses

## 2019-05-31 NOTE — Patient Instructions (Addendum)
Elastic support knee highs (travel socks)

## 2019-05-31 NOTE — Assessment & Plan Note (Signed)
Elastic support knee highs (travel socks) On Furosemide

## 2019-05-31 NOTE — Progress Notes (Signed)
Subjective:  Patient ID: [name] Curia., male    DOB: 1929-06-27  Age: 85 y.o. MRN: [id number]  CC: No chief complaint on file.   HPI [name] Artino. presents for CHF, DOE, creat elevation on Lasix - on 60 mg/d now. SOB when feeding 2 donkeys and 2 horses   Outpatient Medications Prior to Visit  Medication Sig Dispense Refill  . atorvastatin (LIPITOR) 40 MG tablet TAKE 1 TABLET DAILY 90 tablet 3  . baclofen (LIORESAL) 10 MG tablet Take 1 tablet (10 mg total) by mouth 3 (three) times daily. 20 each 0  . carvedilol (COREG) 12.5 MG tablet Take 1 tablet (12.5 mg total) by mouth 2 (two) times daily. 180 tablet 3  . Cholecalciferol 1000 UNITS tablet Take 1,000 Units by mouth daily.      . clopidogrel (PLAVIX) 75 MG tablet Take 1 tablet (75 mg total) by mouth daily. 90 tablet 4  . furosemide (LASIX) 40 MG tablet Take 1.5 tablets (60 mg total) by mouth daily. 45 tablet 11  . irbesartan (AVAPRO) 150 MG tablet Take 0.5 tablets (75 mg total) by mouth daily. Take 1/2 of your 150 mg tablets daily 45 tablet 3  . isosorbide mononitrate (IMDUR) 60 MG 24 hr tablet Take 1.5 tablets (90 mg total) by mouth daily. 120 tablet 3  . Multiple Vitamins-Minerals (PRESERVISION AREDS 2) CAPS Take 1 capsule by mouth daily.     . nitroGLYCERIN (NITROLINGUAL) 0.4 MG/SPRAY spray Place 1 spray under the tongue every 5 (five) minutes x 3 doses as needed for chest pain. 12 g 3  . potassium chloride (K-DUR) 10 MEQ tablet Take 1 tablet (10 mEq total) by mouth daily. 30 tablet 11   No facility-administered medications prior to visit.     ROS: Review of Systems  Constitutional: Negative for appetite change, fatigue and unexpected weight change.  HENT: Negative for congestion, nosebleeds, sneezing, sore throat and trouble swallowing.   Eyes: Negative for itching and visual disturbance.  Respiratory: Positive for shortness of breath. Negative for cough.   Cardiovascular: Positive for leg swelling. Negative for chest  pain and palpitations.  Gastrointestinal: Negative for abdominal distention, blood in stool, diarrhea and nausea.  Genitourinary: Negative for frequency and hematuria.  Musculoskeletal: Positive for arthralgias. Negative for back pain, gait problem, joint swelling and neck pain.  Skin: Negative for rash.  Neurological: Negative for dizziness, tremors, speech difficulty and weakness.  Psychiatric/Behavioral: Negative for agitation, dysphoric mood and sleep disturbance. The patient is not nervous/anxious.     Objective:  BP 138/68 (BP Location: Left Arm, Patient Position: Sitting, Cuff Size: Normal)   Pulse 89   Temp (!) 97.5 F (36.4 C) (Oral)   Ht 5\' 7"  (1.702 m)   Wt 159 lb (72.1 kg)   SpO2 97%   BMI 24.90 kg/m   BP Readings from Last 3 Encounters:  05/31/19 138/68  05/25/19 (!) 112/50  03/15/19 138/73    Wt Readings from Last 3 Encounters:  05/31/19 159 lb (72.1 kg)  05/25/19 161 lb (73 kg)  03/08/19 160 lb 6.4 oz (72.8 kg)    Physical Exam Constitutional:      General: [name] [name] is not in acute distress.    Appearance: [name] [name] is well-developed.     Comments: NAD  Eyes:     Conjunctiva/sclera: Conjunctivae normal.     Pupils: Pupils are equal, round, and reactive to light.  Neck:     Musculoskeletal: Normal range of motion.  Thyroid: No thyromegaly.     Vascular: No JVD.  Cardiovascular:     Rate and Rhythm: Normal rate and regular rhythm.     Heart sounds: Normal heart sounds. No murmur. No friction rub. No gallop.   Pulmonary:     Effort: Pulmonary effort is normal. No respiratory distress.     Breath sounds: Normal breath sounds. No wheezing or rales.  Chest:     Chest wall: No tenderness.  Abdominal:     General: Bowel sounds are normal. There is no distension.     Palpations: Abdomen is soft. There is no mass.     Tenderness: There is no abdominal tenderness. There is no guarding or rebound.  Musculoskeletal: Normal range of motion.        General: No  tenderness.     Right lower leg: Edema present.     Left lower leg: Edema present.  Lymphadenopathy:     Cervical: No cervical adenopathy.  Skin:    General: Skin is warm and dry.     Findings: No rash.  Neurological:     Mental Status: [name] [name] is alert and oriented to person, place, and time.     Cranial Nerves: No cranial nerve deficit.     Motor: No abnormal muscle tone.     Coordination: Coordination normal.     Gait: Gait normal.     Deep Tendon Reflexes: Reflexes are normal and symmetric.  Psychiatric:        Behavior: Behavior normal.        Thought Content: Thought content normal.        Judgment: Judgment normal.    Trace edema B  Lab Results  Component Value Date   WBC 7.1 05/25/2019   HGB 11.3 (L) 05/25/2019   HCT 33.2 (L) 05/25/2019   PLT 229 05/25/2019   GLUCOSE 125 (H) 05/31/2019   CHOL 91 (L) 11/09/2017   TRIG 121 11/09/2017   HDL 35 (L) 11/09/2017   LDLDIRECT 35.0 12/24/2016   LDLCALC 32 11/09/2017   ALT 26 05/25/2019   AST 20 05/25/2019   NA 136 05/31/2019   K 5.0 05/31/2019   CL 101 05/31/2019   CREATININE 1.31 (H) 05/31/2019   BUN 37 (H) 05/31/2019   CO2 23 05/31/2019   TSH 3.71 12/24/2016   PSA 3.11 12/24/2016   INR 1.0 03/25/2016   HGBA1C 6.4 12/24/2016    Dg Lumbar Spine 2-3 Views  Result Date: 10/01/2018 CLINICAL DATA:  Low back pain for 3 days, without trauma. EXAM: LUMBAR SPINE - 2-3 VIEW COMPARISON:  None. FINDINGS: Five lumbar type vertebral bodies. Aortic atherosclerosis. Maintenance of vertebral body height and alignment. Multilevel loss of intervertebral disc height at all lumbar levels. Facet arthropathy involves L4-5 and L5-S1. IMPRESSION: Advanced lumbar spondylosis, without acute osseous finding. Aortic Atherosclerosis (ICD10-I70.0). Electronically Signed   By: Abigail Miyamoto M.D.   On: 10/01/2018 23:27    Assessment & Plan:   Diagnoses and all orders for this visit:  Need for influenza vaccination -     Flu Vaccine QUAD High  Dose(Fluad)     No orders of the defined types were placed in this encounter.    Follow-up: No follow-ups on file.  Walker Kehr, MD

## 2019-06-01 ENCOUNTER — Telehealth: Payer: Self-pay | Admitting: *Deleted

## 2019-06-01 NOTE — Telephone Encounter (Signed)
See lab results, pt has been made aware.

## 2019-06-01 NOTE — Telephone Encounter (Signed)
-----   Message from Liliane Shi, [place] sent at 05/31/2019  5:13 PM EDT ----- Creatinine stable.  Potassium normal.  BNP improved PLAN:   -Continue current medications and follow up as planned.  Richardson Dopp, PA-C    05/31/2019 5:12 PM

## 2019-06-01 NOTE — Telephone Encounter (Signed)
Follow up    Patient returning call in reference to lab results.

## 2019-06-01 NOTE — Telephone Encounter (Signed)
Call placed to pt re: lab results, left a message for pt to call back.  

## 2019-06-15 ENCOUNTER — Other Ambulatory Visit: Payer: Self-pay | Admitting: Cardiovascular Disease

## 2019-06-28 ENCOUNTER — Ambulatory Visit (INDEPENDENT_AMBULATORY_CARE_PROVIDER_SITE_OTHER): Payer: Medicare Other | Admitting: Cardiovascular Disease

## 2019-06-28 ENCOUNTER — Other Ambulatory Visit: Payer: Self-pay

## 2019-06-28 ENCOUNTER — Encounter: Payer: Self-pay | Admitting: Cardiovascular Disease

## 2019-06-28 VITALS — BP 120/62 | HR 79 | Ht 66.0 in | Wt 161.1 lb

## 2019-06-28 DIAGNOSIS — I2510 Atherosclerotic heart disease of native coronary artery without angina pectoris: Secondary | ICD-10-CM

## 2019-06-28 DIAGNOSIS — I5022 Chronic systolic (congestive) heart failure: Secondary | ICD-10-CM | POA: Diagnosis not present

## 2019-06-28 DIAGNOSIS — E785 Hyperlipidemia, unspecified: Secondary | ICD-10-CM

## 2019-06-28 LAB — LIPID PANEL
Chol/HDL Ratio: 2.5 ratio (ref 0.0–5.0)
Cholesterol, Total: 90 mg/dL — ABNORMAL LOW (ref 100–199)
HDL: 36 mg/dL — ABNORMAL LOW (ref >39)
LDL Chol Calc (NIH): 32 mg/dL (ref 0–99)
Triglycerides: 119 mg/dL (ref 0–149)
VLDL Cholesterol Cal: 22 mg/dL (ref 5–40)

## 2019-06-28 MED ORDER — ISOSORBIDE MONONITRATE ER 120 MG PO TB24: 120.0000 mg | ORAL_TABLET | Freq: Every day | ORAL | 3 refills | Status: DC

## 2019-06-28 MED ORDER — RANOLAZINE ER 500 MG PO TB12: 500.0000 mg | ORAL_TABLET | Freq: Two times a day (BID) | ORAL | 3 refills | Status: DC

## 2019-06-28 NOTE — Progress Notes (Signed)
Cardiology Office Note   Date:  06/28/2019   ID:  [name] Curia., DOB 22-Aug-1929, MRN [id number]  PCP:  Cassandria Anger, MD  Cardiologist:  Mertie Moores, MD   Problem list 1. Coronary artery disease-status post coronary artery bypass grafting 2. Hyperlipidemia 3. Chronic systolic congestive heart failure - EF 35-40% 4. ICD -  5.  OSA - Fransico Him, MD   No chief complaint on file.     Notes prior to 2016  [name] Scarry. is a 85 y.o. male who presents today to follow-up coronary disease and ischemic cardiomyopathy and a history of hypertension. He did have an episode of syncope in the past that was related to hypotension. More recently his blood pressure has been elevated. He was hesitant to have his ramapril dose increased because he associated this with his syncopal episode in the past. Losartan was added to his medications. He is getting excellent control and I have left him on both. He feels good. There is no chest pain. He is fully active. He has an ICD in place that is followed carefully by electrophysiology.  Retired [employer] ( [place] )   Nov. 1, 2016:  Doing well.  No CP or dyspnea. 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   January 08, 2016:  See for follow up for his CAD / CABG, chronic systolic CHF , HTN Doing well.    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   Stays active  Does not ride anymore.  Has some fatigue , no CP   Jan. 4, 2018:  Still taking care of his horses.   Feeds and hay twice  No CP , no dyspnea.   Aug. 29, 2018:  [name] [name] is doing well from a cardiac standpoint  Has leg pain . 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 25, 2019:  Doing well .  Gets fatigued when he goes down to take care of the horses.  No CP or dyspnea .  Some DOE with working   May 25, 2018: Seen for follow  Still having some chest pain .   Lasted 30 minutes.    Took NTG  with relief. Chest tightness with activity Has known severe CAD  With patent LIMA. Had 2 DES stents to his ramus Int. In 2017.  His symptoms are not nearly as severe as they were in 2017 when he was sent to the hospital and had stents placed in the ramus intermediate branch.  September 03, 2018: [name] [name] is seen today for follow-up of his coronary artery disease He has had an ICD placed for primary prevention. Was having some dyspnea,   Dr. Caryl Comes increased Imdur to 60  Mg a day which has helped.   Has swelling   March 08, 2019 :  I saw [name] [name] as a telemedicine visit on Jan 25, 2019 .  Having more chest pressure with exertion .  Has marked DOE , when dressing  Legs feel heavy ,  Has leg edema Avoids salty foods.  Takes NTG which has helped the chest tightness.  The IMdur has helped.   June 28, 2019:  [name] [name] is seen today for follow-up visit.  He has a history of coronary artery disease and is status post coronary artery bypass grafting.  He has an ICD placed for primary prevention. Gets DOE with  getting dressed and working with the horses.  I reviewed his cardiac catheterization films from 2017.  He has a patent LIMA to the distal LAD.  He has patent circumflex artery.  There is disease in some of the small branches.  That the RCA is occluded.  Saphenous vein grafts are occluded.  Avoids salty foods.   Will increase Imdur to 120 mg BID and will add Ranexa 500 mg PO BID today    Past Medical History:  Diagnosis Date  . AICD (automatic cardioverter/defibrillator) present   . Arrhythmia   . Arthritis    "hands, legs" (03/26/2016)  . 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 Chronic systolic CHF  (congestive heart failure) ([place])    a. Echo 9/14: Mild LVH, mild focal basal septal hypertrophy, EF 35-40%, anteroseptal HK, normal diastolic function, mild MR, mild LAE, PASP 34 mmHg  . Chronotropic incompetence    treated w/pacemaker, ICD 7.2008. This was placed after CPX done post 2008 cath. CPX showed only chronotropic incompetence  . History of blood transfusion 1969   "after getting MSO4; it destroys my corpuscles" (03/26/2016)  . History of colon polyps   . Hyperlipidemia   . Hypertension   . Migraine    "just before my bypass" (03/26/2016)  . Mitral regurgitation    mild  . Myocardial infarction ([place]) "several"  . Numbness and tingling of left arm and leg    improved after tx w/prednisone  . OSA (obstructive sleep apnea) 09/01/2016   Severe OSA with AHI 38/hr now on BiPAP at 21/17cm H2O  . Syncopal episodes    relative hypotension    Past Surgical History:  Procedure Laterality Date  . BACK SURGERY    . CARDIAC CATHETERIZATION  1980s-1990s X 4  . CARDIAC CATHETERIZATION N/A 03/26/2016   Procedure: Left Heart Cath and Cors/Grafts Angiography;  Surgeon: Burnell Blanks, MD;  Location: [facility];  Service: Cardiovascular;  Laterality: N/A;  . CARDIAC CATHETERIZATION N/A 03/26/2016   Procedure: Coronary Stent Intervention;  Surgeon: Burnell Blanks, MD;  Location: [facility];  Service: Cardiovascular;  Laterality: N/A;  . CARDIAC DEFIBRILLATOR PLACEMENT  2008  . CATARACT EXTRACTION W/ INTRAOCULAR LENS  IMPLANT, BILATERAL Bilateral 08/2013 - 09/2013   right - left   . CORONARY ANGIOPLASTY WITH STENT PLACEMENT  ~ 1992; 03/26/2016   "1 + 2"  . CORONARY ARTERY BYPASS GRAFT  1992   CABG "X4"  . CYSTOSCOPY  1980s  . GAS/FLUID EXCHANGE Right 06/18/2017   Procedure: GAS/FLUID EXCHANGE (C3F8);  Surgeon: Sherlynn Stalls, MD;  Location: [facility];  Service: Ophthalmology;  Laterality: Right;  . IMPLANTABLE CARDIOVERTER DEFIBRILLATOR (ICD) GENERATOR CHANGE N/A  06/21/2014   Procedure: ICD GENERATOR CHANGE;  Surgeon: Deboraha Sprang, MD;  [facility];  Service: Cardiovascular;  Laterality: N/A;  . PARS PLANA VITRECTOMY Right 06/18/2017   Procedure: PARS PLANA VITRECTOMY WITH 25 GAUGE;  Surgeon: Sherlynn Stalls, MD;  Location: [facility];  Service: Ophthalmology;  Laterality: Right;  . PHOTOCOAGULATION WITH LASER Right 06/18/2017   Procedure: PHOTOCOAGULATION WITH LASER- ENDO LASER;  Surgeon: Sherlynn Stalls, MD;  Location: [facility];  Service: Ophthalmology;  Laterality: Right;  . POSTERIOR LUMBAR FUSION  ~ 1989   "fused 2 discs"  . TIBIA FRACTURE SURGERY Left 1943   "put artificial bone in there"    Patient Active Problem List   Diagnosis Date Noted  . Chronic systolic CHF (congestive heart failure) ([place]) 11/18/2013    Priority: High  . CAD (coronary artery disease)     Priority: High  . DOE (dyspnea on exertion) 05/31/2019  . Edema 05/31/2019  . Varicose veins of both lower extremities without ulcer or inflammation 02/02/2018  . Allergic rhinitis 12/29/2017  . HLD (hyperlipidemia) 11/09/2017  . Left lateral epicondylitis 07/02/2017  . Knee pain, right 12/24/2016  . OSA (obstructive sleep apnea) 09/01/2016  . Status post coronary artery stent placement   . Hypertensive heart disease with heart failure ([place])   . Unstable angina ([place])   . Thumb pain 05/04/2015  . Ejection fraction   . Well adult exam 07/13/2012  . Chronic renal insufficiency, stage III (moderate) ([place]) 07/21/2011  . Ischemic cardiomyopathy   . Hx of CABG   . Chronotropic incompetence   . Syncopal episodes   . Mitral regurgitation   . Numbness and tingling of left arm and leg   . Arm pain, diffuse, left 10/22/2009  . PARESTHESIA 10/22/2009  . TOBACCO USE, QUIT 10/22/2009  . SEBACEOUS CYST 08/27/2009  . Cough 02/28/2009  . Anemia, chronic disease 02/18/2008  . Dyslipidemia 05/03/2007  . Hypertensive heart disease 05/03/2007  . COLONIC POLYPS, HX OF 05/03/2007  .  Automatic implantable cardioverter-defibrillator in situ 03/16/2007      Current Outpatient Medications  Medication Sig Dispense Refill  . atorvastatin (LIPITOR) 40 MG tablet TAKE 1 TABLET DAILY 90 tablet 3  . carvedilol (COREG) 12.5 MG tablet Take 1 tablet (12.5 mg total) by mouth 2 (two) times daily. 180 tablet 3  . Cholecalciferol 1000 UNITS tablet Take 1,000 Units by mouth daily.      . clopidogrel (PLAVIX) 75 MG tablet Take 1 tablet (75 mg total) by mouth daily. 90 tablet 4  . furosemide (LASIX) 40 MG tablet Take 1.5 tablets (60 mg total) by mouth daily. 45 tablet 11  . irbesartan (AVAPRO) 150 MG tablet Take 0.5 tablets (75 mg total) by mouth daily. Take 1/2 of your 150 mg tablets daily 45 tablet 3  . isosorbide mononitrate (IMDUR) 60 MG 24 hr tablet Take 1.5 tablets (90 mg total) by mouth daily. 120 tablet 3  . Multiple Vitamins-Minerals (PRESERVISION AREDS 2) CAPS Take 1 capsule by mouth daily.     . nitroGLYCERIN (NITROLINGUAL) 0.4 MG/SPRAY spray Place 1 spray under the tongue every 5 (five) minutes x 3 doses as needed for chest pain. 12 g 3  . potassium chloride (K-DUR) 10 MEQ tablet Take 1 tablet (10 mEq total) by mouth daily. 30 tablet 11   No current facility-administered medications for this visit.     Allergies:   Morphine and Ramipril    Social History:  The patient  reports that he has quit smoking. His smoking use included cigars. He quit after 29.00 years of use. He has never used smokeless tobacco. He reports current alcohol use. He reports that he does not use drugs.   Family History:  The patient's family history includes Colon cancer in an other family member; Hypertension in his father, mother, and another family member.    ROS: Noted in current history, otherwise review of systems is negative.   Physical Exam: Blood pressure 120/62, pulse 79, height 5'  6" (1.676 m), weight 161 lb 1.9 oz (73.1 kg), SpO2 97 %.  GEN:  Elderly , frail appearing man  HEENT:  Normal NECK: No JVD; No carotid bruits LYMPHATICS: No lymphadenopathy CARDIAC: RRR , 2/6 systolic mumur  RESPIRATORY:  Clear to auscultation without rales, wheezing or rhonchi  ABDOMEN: Soft, non-tender, non-distended MUSCULOSKELETAL:  No edema; No deformity  SKIN: Warm and dry NEUROLOGIC:  Alert and oriented x 3  EKG:      Recent Labs: 05/25/2019: ALT 26; Hemoglobin 11.3; Platelets 229 05/31/2019: BUN 37; Creatinine, Ser 1.31; NT-Pro BNP 1,027; Potassium 5.0; Sodium 136    Lipid Panel    Component Value Date/Time   CHOL 91 (L) 11/09/2017 1052   TRIG 121 11/09/2017 1052   HDL 35 (L) 11/09/2017 1052   CHOLHDL 2.6 11/09/2017 1052   CHOLHDL 4 12/24/2016 1103   VLDL 41.8 (H) 12/24/2016 1103   LDLCALC 32 11/09/2017 1052   LDLDIRECT 35.0 12/24/2016 1103      Wt Readings from Last 3 Encounters:  06/28/19 161 lb 1.9 oz (73.1 kg)  05/31/19 159 lb (72.1 kg)  05/25/19 161 lb (73 kg)      Current medicines are reviewed  The patient understands his medications.   ASSESSMENT AND PLAN:  1. Coronary artery disease-status post coronary artery bypass grafting he has only 1 of 3 grafts are patent-his LIMA to LAD.  He has shortness of breath with exertion.  We will increase the isosorbide to 120 mg a day.  We will also add Ranexa 5 mg twice a day.  We will have him see an APP in 1 to 2 months for an office visit and EKG.  Assuming that his QT interval is still okay, we will go up to Ranexa 1000 mg twice a day.  I will see him back in 6 months.  2. Hyperlipidemia -     lipids were checked last year.  We will check a lipid profile today.  Liver enzymes and basic metabolic profile were checked several weeks ago.   3. Chronic systolic congestive heart failure -his ejection fraction is 30 to 35%.  Continue with medical therapy.  He avoids any salty foods.  Advised salt restriction.  Will get an echo.  ( last one was July 2018)   4. ICD -  managed by Dr. Caryl Comes   5. Obstructive sleep apnea -    Mertie Moores, MD  06/28/2019 10:15 AM    [facility] [address],  [address] [place], [place]  [address] Pager [phone] Phone: [phone]; Fax: [phone]

## 2019-06-28 NOTE — Patient Instructions (Addendum)
Medication Instructions:  Your physician has recommended you make the following change in your medication:  1- INCREASE Imdur (Isosorbide Mononitrate) 120 mg daily. 2- START Ranexa 500 mg by mouth twice daily  If you need a refill on your cardiac medications before your next appointment, please call your pharmacy.   Lab work: Your physician recommends that you have lab work today- Fasting lipid panel.   If you have labs (blood work) drawn today and your tests are completely normal, you will receive your results only by: Marland Kitchen MyChart Message (if you have MyChart) OR . A paper copy in the mail If you have any lab test that is abnormal or we need to change your treatment, we will call you to review the results.  Testing/Procedures: None ordered today.  Follow-Up: At [facility], you and your health needs are our priority.  As part of our continuing mission to provide you with exceptional heart care, we have created designated Provider Care Teams.  These Care Teams include your primary Cardiologist (physician) and Advanced Practice Providers (APPs -  Physician Assistants and Nurse Practitioners) who all work together to provide you with the care you need, when you need it. You will need a follow up appointment in:  1 to 2 months with an EKG .You may see one of the following Advanced Practice Providers on your designated Care Team: Richardson Dopp, PA-C [employer], [place] . Daune Perch, NP

## 2019-07-04 ENCOUNTER — Other Ambulatory Visit: Payer: Self-pay | Admitting: Cardiovascular Disease

## 2019-07-04 MED ORDER — POTASSIUM CHLORIDE ER 10 MEQ PO TBCR: 10.0000 meq | EXTENDED_RELEASE_TABLET | Freq: Every day | ORAL | 3 refills | Status: DC

## 2019-07-12 ENCOUNTER — Other Ambulatory Visit: Payer: Self-pay | Admitting: Cardiovascular Disease

## 2019-07-16 ENCOUNTER — Encounter (HOSPITAL_COMMUNITY): Payer: Self-pay | Admitting: Emergency Medicine

## 2019-07-16 ENCOUNTER — Other Ambulatory Visit: Payer: Self-pay

## 2019-07-16 ENCOUNTER — Emergency Department (HOSPITAL_COMMUNITY): Payer: Medicare Other

## 2019-07-16 ENCOUNTER — Emergency Department (HOSPITAL_COMMUNITY)
Admission: EM | Admit: 2019-07-16 | Discharge: 2019-07-16 | Disposition: A | Discharge status: Home / Self Care | Payer: Medicare Other | Attending: Emergency Medicine | Admitting: Emergency Medicine

## 2019-07-16 DIAGNOSIS — L03114 Cellulitis of left upper limb: Secondary | ICD-10-CM | POA: Insufficient documentation

## 2019-07-16 DIAGNOSIS — Y929 Unspecified place or not applicable: Secondary | ICD-10-CM | POA: Diagnosis not present

## 2019-07-16 DIAGNOSIS — W5501XA Bitten by cat, initial encounter: Secondary | ICD-10-CM | POA: Diagnosis not present

## 2019-07-16 DIAGNOSIS — I252 Old myocardial infarction: Secondary | ICD-10-CM | POA: Insufficient documentation

## 2019-07-16 DIAGNOSIS — S61452A Open bite of left hand, initial encounter: Secondary | ICD-10-CM | POA: Diagnosis not present

## 2019-07-16 DIAGNOSIS — I25810 Atherosclerosis of coronary artery bypass graft(s) without angina pectoris: Secondary | ICD-10-CM | POA: Insufficient documentation

## 2019-07-16 DIAGNOSIS — Z87891 Personal history of nicotine dependence: Secondary | ICD-10-CM | POA: Insufficient documentation

## 2019-07-16 DIAGNOSIS — I5022 Chronic systolic (congestive) heart failure: Secondary | ICD-10-CM | POA: Diagnosis not present

## 2019-07-16 DIAGNOSIS — L03012 Cellulitis of left finger: Secondary | ICD-10-CM

## 2019-07-16 DIAGNOSIS — I130 Hypertensive heart and chronic kidney disease with heart failure and stage 1 through stage 4 chronic kidney disease, or unspecified chronic kidney disease: Secondary | ICD-10-CM | POA: Insufficient documentation

## 2019-07-16 DIAGNOSIS — Z95810 Presence of automatic (implantable) cardiac defibrillator: Secondary | ICD-10-CM | POA: Insufficient documentation

## 2019-07-16 DIAGNOSIS — Z79899 Other long term (current) drug therapy: Secondary | ICD-10-CM | POA: Insufficient documentation

## 2019-07-16 DIAGNOSIS — N183 Chronic kidney disease, stage 3 unspecified: Secondary | ICD-10-CM | POA: Insufficient documentation

## 2019-07-16 DIAGNOSIS — Y939 Activity, unspecified: Secondary | ICD-10-CM | POA: Diagnosis not present

## 2019-07-16 DIAGNOSIS — Y999 Unspecified external cause status: Secondary | ICD-10-CM | POA: Insufficient documentation

## 2019-07-16 MED ORDER — AMOXICILLIN-POT CLAVULANATE 875-125 MG PO TABS
1.0000 | ORAL_TABLET | Freq: Once | ORAL | Status: AC
  Administered 2019-07-16: 1 via ORAL
  Filled 2019-07-16: qty 1

## 2019-07-16 MED ORDER — AMOXICILLIN-POT CLAVULANATE 875-125 MG PO TABS: 1.0000 | ORAL_TABLET | Freq: Two times a day (BID) | ORAL | 0 refills | Status: AC

## 2019-07-16 NOTE — ED Triage Notes (Signed)
Pt states that he was bitten on the left hand by his cat yesterday. Wound is swollen and red. Pt states the cat is up to date on rabies vaccine.

## 2019-07-16 NOTE — Discharge Instructions (Addendum)
You have been diagnosed today with cat bite of the left hand with cellulitis.  At this time there does not appear to be the presence of an emergent medical condition, however there is always the potential for conditions to change. Please read and follow the below instructions.  Please return to the Emergency Department immediately for any new or worsening symptoms or if your symptoms do not begin to improve within the next 2 days. Please be sure to follow up with your Primary Care Provider within one week regarding your visit today; please call their office to schedule an appointment even if you are feeling better for a follow-up visit. Please take antibiotic Augmentin as prescribed twice daily to treat your infection.  Please call your primary care doctor's office tomorrow to schedule follow-up appointment.  Please have your wound and infection rechecked in 2-3 days to ensure proper healing.  You may have your wound rechecked at your primary care doctor's office, and urgent care or here at the emergency department.  Return to the emergency department immediately if you develop any new or worsening symptoms. You reported today that your cat was up-to-date on its rabies vaccination, and sure this is true with your animals vet.  Observe your animal for any abnormal behavior over the next few weeks, if you notice abnormal behavior discussed this with your vet and primary care provider immediately.  Get help right away if: You have a red streak going away from your wound. You have any of these coming from your wound: Non-clear fluid. More blood. Pus or a bad smell. You have trouble moving your injured area. You lose feeling (have numbness) or feel tingling anywhere on your body. You feel very sleepy. You throw up (vomit) or have watery poop (diarrhea) for a long time. You see red streaks coming from the area. Your red area gets larger. Your red area turns dark in color. You have any new/concerning  or worsening of symptoms.  Please read the additional information packets attached to your discharge summary.  Do not take your medicine if  develop an itchy rash, swelling in your mouth or lips, or difficulty breathing; call 911 and seek immediate emergency medical attention if this occurs.  Note: Portions of this text may have been transcribed using voice recognition software. Every effort was made to ensure accuracy; however, inadvertent computerized transcription errors may still be present.

## 2019-07-16 NOTE — ED Provider Notes (Cosign Needed)
[facility] EMERGENCY DEPARTMENT Provider Note   CSN: [id number] Arrival date & time: 07/16/19  1911     History   Chief Complaint Chief Complaint  Patient presents with  . Animal Bite    HPI [name] [name]. is a 85 y.o. male past medical history of MI, CAD, AICD, CHF, hypertension, hyperlipidemia, mitral regurg, OSA.  Patient reports that yesterday morning he was bitten on the left dorsum hand by his own cat.  Reports since that time he has had increasing pain and swelling a mild throbbing sensation constant worsened with palpation and without alleviating factor, no medication prior to arrival.  Reports mild erythema and swelling increasing since yesterday.  Of note patient reports this is his indoor cat that he has had for over 1 month.  Reports that this animal is up-to-date on all immunizations including rabies, cat is still in their care and they are able to observe the cat for any abnormal behavior.  Patient reports tetanus up-to-date within the last 4 years.     HPI  Past Medical History:  Diagnosis Date  . AICD (automatic cardioverter/defibrillator) present   . Arrhythmia   . Arthritis    "hands, legs" (03/26/2016)  . 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 Chronic systolic CHF (congestive heart failure) ([place])    a. Echo 9/14: Mild LVH, mild focal basal septal hypertrophy, EF 35-40%, anteroseptal HK, normal diastolic function, mild MR, mild LAE, PASP 34 mmHg  . Chronotropic incompetence    treated w/pacemaker, ICD 7.2008. This was placed after CPX done post 2008 cath. CPX showed only chronotropic incompetence  . History of blood transfusion 1969   "after getting MSO4; it destroys my  corpuscles" (03/26/2016)  . History of colon polyps   . Hyperlipidemia   . Hypertension   . Migraine    "just before my bypass" (03/26/2016)  . Mitral regurgitation    mild  . Myocardial infarction ([place]) "several"  . Numbness and tingling of left arm and leg    improved after tx w/prednisone  . OSA (obstructive sleep apnea) 09/01/2016   Severe OSA with AHI 38/hr now on BiPAP at 21/17cm H2O  . Syncopal episodes    relative hypotension    Patient Active Problem List   Diagnosis Date Noted  . DOE (dyspnea on exertion) 05/31/2019  . Edema 05/31/2019  . Varicose veins of both lower extremities without ulcer or inflammation 02/02/2018  . Allergic rhinitis 12/29/2017  . HLD (hyperlipidemia) 11/09/2017  . Left lateral epicondylitis 07/02/2017  . Knee pain, right 12/24/2016  . OSA (obstructive sleep apnea) 09/01/2016  . Status post coronary artery stent placement   . Hypertensive heart disease with heart failure ([place])   . Unstable angina ([place])   . Thumb pain 05/04/2015  . Chronic systolic CHF (congestive heart failure) ([place]) 11/18/2013  . Ejection fraction   . CAD (coronary artery disease)   . Well adult exam 07/13/2012  . Chronic renal insufficiency, stage III (moderate) ([place]) 07/21/2011  . Ischemic cardiomyopathy   . Hx of CABG   . Chronotropic incompetence   . Syncopal episodes   . Mitral regurgitation   . Numbness and tingling  of left arm and leg   . Arm pain, diffuse, left 10/22/2009  . PARESTHESIA 10/22/2009  . TOBACCO USE, QUIT 10/22/2009  . SEBACEOUS CYST 08/27/2009  . Cough 02/28/2009  . Anemia, chronic disease 02/18/2008  . Dyslipidemia 05/03/2007  . Hypertensive heart disease 05/03/2007  . COLONIC POLYPS, HX OF 05/03/2007  . Automatic implantable cardioverter-defibrillator in situ 03/16/2007    Past Surgical History:  Procedure Laterality Date  . BACK SURGERY    . CARDIAC CATHETERIZATION  1980s-1990s X 4  . CARDIAC CATHETERIZATION N/A 03/26/2016   Procedure:  Left Heart Cath and Cors/Grafts Angiography;  Surgeon: Burnell Blanks, MD;  Location: [place] [facility];  Service: Cardiovascular;  Laterality: N/A;  . CARDIAC CATHETERIZATION N/A 03/26/2016   Procedure: Coronary Stent Intervention;  Surgeon: Burnell Blanks, MD;  Location: [place] [facility];  Service: Cardiovascular;  Laterality: N/A;  . CARDIAC DEFIBRILLATOR PLACEMENT  2008  . CATARACT EXTRACTION W/ INTRAOCULAR LENS  IMPLANT, BILATERAL Bilateral 08/2013 - 09/2013   right - left   . CORONARY ANGIOPLASTY WITH STENT PLACEMENT  ~ 1992; 03/26/2016   "1 + 2"  . CORONARY ARTERY BYPASS GRAFT  1992   CABG "X4"  . CYSTOSCOPY  1980s  . GAS/FLUID EXCHANGE Right 06/18/2017   Procedure: GAS/FLUID EXCHANGE (C3F8);  Surgeon: Sherlynn Stalls, MD;  Location: [place];  Service: Ophthalmology;  Laterality: Right;  . IMPLANTABLE CARDIOVERTER DEFIBRILLATOR (ICD) GENERATOR CHANGE N/A 06/21/2014   Procedure: ICD GENERATOR CHANGE;  Surgeon: Deboraha Sprang, MD;  Location: [facility] LAB;  Service: Cardiovascular;  Laterality: N/A;  . PARS PLANA VITRECTOMY Right 06/18/2017   Procedure: PARS PLANA VITRECTOMY WITH 25 GAUGE;  Surgeon: Sherlynn Stalls, MD;  Location: [place];  Service: Ophthalmology;  Laterality: Right;  . PHOTOCOAGULATION WITH LASER Right 06/18/2017   Procedure: PHOTOCOAGULATION WITH LASER- ENDO LASER;  Surgeon: Sherlynn Stalls, MD;  Location: [place];  Service: Ophthalmology;  Laterality: Right;  . POSTERIOR LUMBAR FUSION  ~ 1989   "fused 2 discs"  . TIBIA FRACTURE SURGERY Left 1943   "put artificial bone in there"        Home Medications    Prior to Admission medications   Medication Sig Start Date End Date Taking? Authorizing Provider  amoxicillin-clavulanate (AUGMENTIN) 875-125 MG tablet Take 1 tablet by mouth every 12 (twelve) hours for 10 days. 07/16/19 07/26/19  Nuala Alpha A, PA-C  atorvastatin (LIPITOR) 40 MG tablet TAKE 1 TABLET DAILY 06/16/19   Nahser, Wonda Cheng, MD  carvedilol  (COREG) 12.5 MG tablet TAKE 1 TABLET TWICE A DAY 07/14/19   Nahser, Wonda Cheng, MD  Cholecalciferol 1000 UNITS tablet Take 1,000 Units by mouth daily.      [provider]  clopidogrel (PLAVIX) 75 MG tablet Take 1 tablet (75 mg total) by mouth daily. 07/08/18   Burnell Blanks, MD  furosemide (LASIX) 40 MG tablet Take 1.5 tablets (60 mg total) by mouth daily. 05/26/19   Richardson Dopp T, PA-C  irbesartan (AVAPRO) 150 MG tablet Take 0.5 tablets (75 mg total) by mouth daily. Take 1/2 of your 150 mg tablets daily 01/25/19   Nahser, Wonda Cheng, MD  isosorbide mononitrate (IMDUR) 120 MG 24 hr tablet Take 1 tablet (120 mg total) by mouth daily. 06/28/19 06/22/20  Nahser, Wonda Cheng, MD  Multiple Vitamins-Minerals (PRESERVISION AREDS 2) CAPS Take 1 capsule by mouth daily.     [provider]  nitroGLYCERIN (NITROLINGUAL) 0.4 MG/SPRAY spray Place 1 spray under the tongue every 5 (  five) minutes x 3 doses as needed for chest pain. 05/25/18   Nahser, Wonda Cheng, MD  potassium chloride (KLOR-CON) 10 MEQ tablet Take 1 tablet (10 mEq total) by mouth daily. 07/04/19   Nahser, Wonda Cheng, MD  ranolazine (RANEXA) 500 MG 12 hr tablet Take 1 tablet (500 mg total) by mouth 2 (two) times daily. 06/28/19   Nahser, Wonda Cheng, MD    Family History Family History  Problem Relation Age of Onset  . Hypertension Mother   . Hypertension Father   . Hypertension Other   . Colon cancer Other        1st degree relative <50    Social History Social History   Tobacco Use  . Smoking status: Former Smoker    Years: 29.00    Types: Cigars  . Smokeless tobacco: Never Used  . Tobacco comment: quit smoking cigars in 1973  Substance Use Topics  . Alcohol use: Not Currently    Comment: "quit drinking in 1973"  . Drug use: No     Allergies   Morphine and Ramipril   Review of Systems Review of Systems Ten systems are reviewed and are negative for acute change except as noted in the HPI  Physical Exam  Updated Vital Signs BP (!) 117/55   Pulse 70   Temp 98.2 F (36.8 C)   Resp 16   Ht 5\' 6"  (1.676 m)   Wt 72.1 kg   SpO2 99%   BMI 25.66 kg/m   Physical Exam Constitutional:      General: He is not in acute distress.    Appearance: Normal appearance. He is well-developed. He is not ill-appearing or diaphoretic.     Comments: Elderly  HENT:     Head: Normocephalic and atraumatic.     Right Ear: External ear normal.     Left Ear: External ear normal.     Nose: Nose normal.  Eyes:     General: Vision grossly intact. Gaze aligned appropriately.     Pupils: Pupils are equal, round, and reactive to light.  Neck:     Musculoskeletal: Normal range of motion.     Trachea: Trachea and phonation normal. No tracheal deviation.  Cardiovascular:     Rate and Rhythm: Normal rate and regular rhythm.     Pulses:          Radial pulses are 2+ on the right side and 2+ on the left side.  Pulmonary:     Effort: Pulmonary effort is normal. No respiratory distress.  Abdominal:     General: There is no distension.     Palpations: Abdomen is soft.     Tenderness: There is no abdominal tenderness. There is no guarding or rebound.  Musculoskeletal: Normal range of motion.     Comments: Left hand: 2 puncture wounds consistent with cat bite to the dorsum of the left hand, moderate swelling and surrounding erythema compared to contralateral hand.  Fingers appear normal.  Minimal tenderness to palpation.  No induration, fluctuance or drainage. No snuffbox tenderness to palpation. No tenderness to palpation over flexor sheath.  Finger adduction/abduction intact with 5/5 strength.  Thumb opposition intact. Full active and resisted ROM to flexion/extension at wrist, MCP, PIP and DIP of all fingers.  FDS/FDP intact. Grip 5/5 strength.  Radial artery 2+ with <2sec cap refill in all fingers.  Sensation intact to light-tough in median/ulnar/radial distributions.  Compartments soft to palpation.  See picture  below.  Skin:    General:  Skin is warm and dry.  Neurological:     Mental Status: He is alert.     GCS: GCS eye subscore is 4. GCS verbal subscore is 5. GCS motor subscore is 6.     Comments: Speech is clear and goal oriented, follows commands Major Cranial nerves without deficit, no facial droop Moves extremities without ataxia, coordination intact Equal grip strength bilaterally.  Psychiatric:        Behavior: Behavior normal.      ED Treatments / Results  Labs (all labs ordered are listed, but only abnormal results are displayed) Labs Reviewed - No data to display  EKG None  Radiology Dg Hand Complete Left  Result Date: 07/16/2019 CLINICAL DATA:  Animal bite. EXAM: LEFT HAND - COMPLETE 3+ VIEW COMPARISON:  None. FINDINGS: There is soft tissue swelling about the hand, most notably the fourth digit. There is no radiopaque foreign body. No evidence for displaced fracture or dislocation. There is no radiographic evidence for osteomyelitis. There are advanced degenerative changes at the first carpometacarpal joint. Vascular calcifications are noted. There are degenerative changes of the interphalangeal joints. IMPRESSION: 1. No acute displaced fracture or dislocation. 2. Nonspecific soft tissue swelling about the hand. 3. Degenerative changes as above. Electronically Signed   By: Constance Holster M.D.   On: 07/16/2019 20:32    Procedures Procedures (including critical care time)  Medications Ordered in ED Medications  amoxicillin-clavulanate (AUGMENTIN) 875-125 MG per tablet 1 tablet (1 tablet Oral Given 07/16/19 2113)     Initial Impression / Assessment and Plan / ED Course  I have reviewed the triage vital signs and the nursing notes.  Pertinent labs & imaging results that were available during my care of the patient were reviewed by me and considered in my medical decision making (see chart for details).    DG left hand:  IMPRESSION:  1. No acute displaced fracture  or dislocation.  2. Nonspecific soft tissue swelling about the hand.  3. Degenerative changes as above.  - 85 year old male bitten by his indoor cat yesterday morning has developed surrounding cellulitis.  X-ray without osteoinvolvement.  He has no pain with movement suggestive of septic joint.  There is no induration, fluctuance or drainage suggestive of abscess formation.  Compartments are soft.  Neurovascular intact to the upper extremity with appropriate strength and range of motion with all movements.  Patient reports cat is up-to-date on rabies vaccination and he is able to observe the animal in his home for abnormal behaviors.  Additionally patient reports that he is up-to-date on his tetanus shot within the last 4 years.  Plan of care is to start patient on Augmentin twice daily x10 days and have him follow-up with his PCP in the next 2-3 days for wound recheck and for him to return to the emergency department immediately for any new or worsening symptoms.  At this time there does not appear to be any evidence of an acute emergency medical condition and the patient appears stable for discharge with appropriate outpatient follow up. Diagnosis was discussed with patient who verbalizes understanding of care plan and is agreeable to discharge. I have discussed return precautions with patient who verbalizes understanding of return precautions. Patient encouraged to follow-up with their PCP. All questions answered.  Creatinine clearance based on BMP 1 month ago with creatinine 1.31 shows creatinine clearance 40 mL/min, no dose adjustment necessary for Augmentin per up-to-date.  Patient seen and evaluated by Dr. Langston Masker during this visit who agrees with  plan of care, Augmentin and PCP recheck.  Of note patient's ring was removed by Dr. Langston Masker.  Note: Portions of this report may have been transcribed using voice recognition software. Every effort was made to ensure accuracy; however, inadvertent  computerized transcription errors may still be present. Final Clinical Impressions(s) / ED Diagnoses   Final diagnoses:  Cat bite, initial encounter  Cellulitis of finger of left hand    ED Discharge Orders         Ordered    amoxicillin-clavulanate (AUGMENTIN) 875-125 MG tablet  Every 12 hours     07/16/19 2116           Gari Crown 07/16/19 2117

## 2019-07-20 ENCOUNTER — Encounter: Payer: Self-pay | Admitting: Internal Medicine

## 2019-07-20 ENCOUNTER — Other Ambulatory Visit: Payer: Self-pay

## 2019-07-20 ENCOUNTER — Ambulatory Visit (INDEPENDENT_AMBULATORY_CARE_PROVIDER_SITE_OTHER): Payer: Medicare Other | Admitting: Internal Medicine

## 2019-07-20 DIAGNOSIS — W5501XD Bitten by cat, subsequent encounter: Secondary | ICD-10-CM | POA: Diagnosis not present

## 2019-07-20 DIAGNOSIS — R600 Localized edema: Secondary | ICD-10-CM | POA: Diagnosis not present

## 2019-07-20 DIAGNOSIS — S61452A Open bite of left hand, initial encounter: Secondary | ICD-10-CM

## 2019-07-20 DIAGNOSIS — W5501XA Bitten by cat, initial encounter: Secondary | ICD-10-CM | POA: Insufficient documentation

## 2019-07-20 NOTE — Progress Notes (Signed)
Subjective:  Patient ID: [name] Curia., male    DOB: 1929/08/23  Age: 85 y.o. MRN: [id number]  CC: No chief complaint on file.   HPI [name] [name]. presents for a cat bite to L hand. S/p ER visit on 10/31 - on Augmentin. It is better per pt, color and swelling is better L handed  Outpatient Medications Prior to Visit  Medication Sig Dispense Refill  . amoxicillin-clavulanate (AUGMENTIN) 875-125 MG tablet Take 1 tablet by mouth every 12 (twelve) hours for 10 days. 20 tablet 0  . atorvastatin (LIPITOR) 40 MG tablet TAKE 1 TABLET DAILY 90 tablet 3  . carvedilol (COREG) 12.5 MG tablet TAKE 1 TABLET TWICE A DAY 180 tablet 3  . Cholecalciferol 1000 UNITS tablet Take 1,000 Units by mouth daily.      . clopidogrel (PLAVIX) 75 MG tablet Take 1 tablet (75 mg total) by mouth daily. 90 tablet 4  . furosemide (LASIX) 40 MG tablet Take 1.5 tablets (60 mg total) by mouth daily. 45 tablet 11  . irbesartan (AVAPRO) 150 MG tablet Take 0.5 tablets (75 mg total) by mouth daily. Take 1/2 of your 150 mg tablets daily 45 tablet 3  . isosorbide mononitrate (IMDUR) 120 MG 24 hr tablet Take 1 tablet (120 mg total) by mouth daily. 90 tablet 3  . Multiple Vitamins-Minerals (PRESERVISION AREDS 2) CAPS Take 1 capsule by mouth daily.     . nitroGLYCERIN (NITROLINGUAL) 0.4 MG/SPRAY spray Place 1 spray under the tongue every 5 (five) minutes x 3 doses as needed for chest pain. 12 g 3  . potassium chloride (KLOR-CON) 10 MEQ tablet Take 1 tablet (10 mEq total) by mouth daily. 90 tablet 3  . ranolazine (RANEXA) 500 MG 12 hr tablet Take 1 tablet (500 mg total) by mouth 2 (two) times daily. 90 tablet 3   No facility-administered medications prior to visit.     ROS: Review of Systems  Constitutional: Negative for chills, diaphoresis and fever.  Skin: Positive for color change and wound.  Neurological: Negative for weakness.    Objective:  Ht 5\' 6"  (1.676 m)   Wt 159 lb (72.1 kg)   BMI 25.66 kg/m   BP  Readings from Last 3 Encounters:  07/16/19 (!) 117/55  06/28/19 120/62  05/31/19 138/68    Wt Readings from Last 3 Encounters:  07/20/19 159 lb (72.1 kg)  07/16/19 159 lb (72.1 kg)  06/28/19 161 lb 1.9 oz (73.1 kg)    Physical Exam Constitutional:      General: He is not in acute distress.    Appearance: He is well-developed.     Comments: NAD  Eyes:     Conjunctiva/sclera: Conjunctivae normal.     Pupils: Pupils are equal, round, and reactive to light.  Neck:     Musculoskeletal: Normal range of motion.     Thyroid: No thyromegaly.     Vascular: No JVD.  Cardiovascular:     Rate and Rhythm: Normal rate and regular rhythm.     Heart sounds: Normal heart sounds. No murmur. No friction rub. No gallop.   Pulmonary:     Effort: Pulmonary effort is normal. No respiratory distress.     Breath sounds: Normal breath sounds. No wheezing or rales.  Chest:     Chest wall: No tenderness.  Abdominal:     General: Bowel sounds are normal. There is no distension.     Palpations: Abdomen is soft. There is no mass.  Tenderness: There is no abdominal tenderness. There is no guarding or rebound.  Musculoskeletal: Normal range of motion.        General: Swelling present. No tenderness.     Right lower leg: Edema present.     Left lower leg: Edema present.  Lymphadenopathy:     Cervical: No cervical adenopathy.  Skin:    General: Skin is warm and dry.     Findings: Erythema present. No rash.  Neurological:     Mental Status: He is alert and oriented to person, place, and time.     Cranial Nerves: No cranial nerve deficit.     Motor: No abnormal muscle tone.     Coordination: Coordination normal.     Gait: Gait normal.     Deep Tendon Reflexes: Reflexes are normal and symmetric.  Psychiatric:        Behavior: Behavior normal.        Thought Content: Thought content normal.        Judgment: Judgment normal.   L hand is swollen, NT    Lab Results  Component Value Date   WBC  7.1 05/25/2019   HGB 11.3 (L) 05/25/2019   HCT 33.2 (L) 05/25/2019   PLT 229 05/25/2019   GLUCOSE 125 (H) 05/31/2019   CHOL 90 (L) 06/28/2019   TRIG 119 06/28/2019   HDL 36 (L) 06/28/2019   LDLDIRECT 35.0 12/24/2016   LDLCALC 32 06/28/2019   ALT 26 05/25/2019   AST 20 05/25/2019   NA 136 05/31/2019   K 5.0 05/31/2019   CL 101 05/31/2019   CREATININE 1.31 (H) 05/31/2019   BUN 37 (H) 05/31/2019   CO2 23 05/31/2019   TSH 3.71 12/24/2016   PSA 3.11 12/24/2016   INR 1.0 03/25/2016   HGBA1C 6.4 12/24/2016    Dg Hand Complete Left  Result Date: 07/16/2019 CLINICAL DATA:  Animal bite. EXAM: LEFT HAND - COMPLETE 3+ VIEW COMPARISON:  None. FINDINGS: There is soft tissue swelling about the hand, most notably the fourth digit. There is no radiopaque foreign body. No evidence for displaced fracture or dislocation. There is no radiographic evidence for osteomyelitis. There are advanced degenerative changes at the first carpometacarpal joint. Vascular calcifications are noted. There are degenerative changes of the interphalangeal joints. IMPRESSION: 1. No acute displaced fracture or dislocation. 2. Nonspecific soft tissue swelling about the hand. 3. Degenerative changes as above. Electronically Signed   By: Constance Holster M.D.   On: 07/16/2019 20:32    Assessment & Plan:   There are no diagnoses linked to this encounter.   No orders of the defined types were placed in this encounter.    Follow-up: No follow-ups on file.  Walker Kehr, MD

## 2019-07-20 NOTE — Assessment & Plan Note (Signed)
L hand - elevate hand ACE wrap

## 2019-07-20 NOTE — Assessment & Plan Note (Signed)
Augmentin po Elevate hand ACE wrap

## 2019-07-20 NOTE — Patient Instructions (Signed)
Elevate the hand up

## 2019-07-28 ENCOUNTER — Other Ambulatory Visit: Payer: Self-pay

## 2019-07-28 ENCOUNTER — Encounter: Payer: Self-pay | Admitting: Internal Medicine

## 2019-07-28 ENCOUNTER — Ambulatory Visit (INDEPENDENT_AMBULATORY_CARE_PROVIDER_SITE_OTHER): Payer: Medicare Other | Admitting: Internal Medicine

## 2019-07-28 DIAGNOSIS — W5501XD Bitten by cat, subsequent encounter: Secondary | ICD-10-CM | POA: Diagnosis not present

## 2019-07-28 DIAGNOSIS — S61452D Open bite of left hand, subsequent encounter: Secondary | ICD-10-CM | POA: Diagnosis not present

## 2019-07-28 NOTE — Assessment & Plan Note (Signed)
Resolving Watch for progress RTC if not improving OK to use hand Abx - finished

## 2019-07-28 NOTE — Progress Notes (Signed)
Subjective:  Patient ID: [name] Curia., male    DOB: Jan 05, 1929  Age: 85 y.o. MRN: [id number]  CC: No chief complaint on file.   HPI [name] [name]. presents for a L hand cat bite f/u. He finished the abx    Outpatient Medications Prior to Visit  Medication Sig Dispense Refill  . atorvastatin (LIPITOR) 40 MG tablet TAKE 1 TABLET DAILY 90 tablet 3  . carvedilol (COREG) 12.5 MG tablet TAKE 1 TABLET TWICE A DAY 180 tablet 3  . Cholecalciferol 1000 UNITS tablet Take 1,000 Units by mouth daily.      . clopidogrel (PLAVIX) 75 MG tablet Take 1 tablet (75 mg total) by mouth daily. 90 tablet 4  . furosemide (LASIX) 40 MG tablet Take 1.5 tablets (60 mg total) by mouth daily. 45 tablet 11  . irbesartan (AVAPRO) 150 MG tablet Take 0.5 tablets (75 mg total) by mouth daily. Take 1/2 of your 150 mg tablets daily 45 tablet 3  . isosorbide mononitrate (IMDUR) 120 MG 24 hr tablet Take 1 tablet (120 mg total) by mouth daily. 90 tablet 3  . Multiple Vitamins-Minerals (PRESERVISION AREDS 2) CAPS Take 1 capsule by mouth daily.     . nitroGLYCERIN (NITROLINGUAL) 0.4 MG/SPRAY spray Place 1 spray under the tongue every 5 (five) minutes x 3 doses as needed for chest pain. 12 g 3  . potassium chloride (KLOR-CON) 10 MEQ tablet Take 1 tablet (10 mEq total) by mouth daily. 90 tablet 3  . ranolazine (RANEXA) 500 MG 12 hr tablet Take 1 tablet (500 mg total) by mouth 2 (two) times daily. 90 tablet 3   No facility-administered medications prior to visit.     ROS: Review of Systems  Constitutional: Negative for fatigue and fever.  Skin: Negative for color change, rash and wound.  Neurological: Negative for weakness.    Objective:  BP 130/68 (BP Location: Left Arm, Patient Position: Sitting, Cuff Size: Normal)   Pulse 88   Temp (!) 97.5 F (36.4 C) (Oral)   Ht 5\' 6"  (1.676 m)   Wt 161 lb (73 kg)   SpO2 98%   BMI 25.99 kg/m   BP Readings from Last 3 Encounters:  07/28/19 130/68  07/20/19 130/70   07/16/19 (!) 117/55    Wt Readings from Last 3 Encounters:  07/28/19 161 lb (73 kg)  07/20/19 159 lb (72.1 kg)  07/16/19 159 lb (72.1 kg)    Physical Exam Constitutional:      Appearance: Normal appearance.  Musculoskeletal:        General: No swelling or tenderness.  Skin:    Coloration: Skin is not pale.     Findings: No bruising, erythema, lesion or rash.  Neurological:     Mental Status: He is alert and oriented to person, place, and time.     Coordination: Coordination normal.   L hand is better - less swelling, NT  Lab Results  Component Value Date   WBC 7.1 05/25/2019   HGB 11.3 (L) 05/25/2019   HCT 33.2 (L) 05/25/2019   PLT 229 05/25/2019   GLUCOSE 125 (H) 05/31/2019   CHOL 90 (L) 06/28/2019   TRIG 119 06/28/2019   HDL 36 (L) 06/28/2019   LDLDIRECT 35.0 12/24/2016   LDLCALC 32 06/28/2019   ALT 26 05/25/2019   AST 20 05/25/2019   NA 136 05/31/2019   K 5.0 05/31/2019   CL 101 05/31/2019   CREATININE 1.31 (H) 05/31/2019   BUN 37 (H)  05/31/2019   CO2 23 05/31/2019   TSH 3.71 12/24/2016   PSA 3.11 12/24/2016   INR 1.0 03/25/2016   HGBA1C 6.4 12/24/2016    Dg Hand Complete Left  Result Date: 07/16/2019 CLINICAL DATA:  Animal bite. EXAM: LEFT HAND - COMPLETE 3+ VIEW COMPARISON:  None. FINDINGS: There is soft tissue swelling about the hand, most notably the fourth digit. There is no radiopaque foreign body. No evidence for displaced fracture or dislocation. There is no radiographic evidence for osteomyelitis. There are advanced degenerative changes at the first carpometacarpal joint. Vascular calcifications are noted. There are degenerative changes of the interphalangeal joints. IMPRESSION: 1. No acute displaced fracture or dislocation. 2. Nonspecific soft tissue swelling about the hand. 3. Degenerative changes as above. Electronically Signed   By: Constance Holster M.D.   On: 07/16/2019 20:32    Assessment & Plan:   There are no diagnoses linked to this  encounter.   No orders of the defined types were placed in this encounter.    Follow-up: No follow-ups on file.  Walker Kehr, MD

## 2019-08-01 ENCOUNTER — Ambulatory Visit (INDEPENDENT_AMBULATORY_CARE_PROVIDER_SITE_OTHER): Payer: Medicare Other | Admitting: *Deleted

## 2019-08-01 DIAGNOSIS — I255 Ischemic cardiomyopathy: Secondary | ICD-10-CM

## 2019-08-01 DIAGNOSIS — I5022 Chronic systolic (congestive) heart failure: Secondary | ICD-10-CM

## 2019-08-02 LAB — CUP PACEART REMOTE DEVICE CHECK
Battery Remaining Longevity: 39 mo
Battery Remaining Percentage: 46 %
Battery Voltage: 2.9 V
Brady Statistic AP VP Percent: 2.8 %
Brady Statistic AP VS Percent: 93 %
Brady Statistic AS VP Percent: 1 %
Brady Statistic AS VS Percent: 3.6 %
Brady Statistic RA Percent Paced: 95 %
Brady Statistic RV Percent Paced: 2.8 %
Date Time Interrogation Session: 20201116070017
HighPow Impedance: 74 Ohm
HighPow Impedance: 74 Ohm
Implantable Lead Implant Date: 20080711
Implantable Lead Implant Date: 20080711
Implantable Lead Location: 753859
Implantable Lead Location: 753860
Implantable Lead Model: 7122
Implantable Pulse Generator Implant Date: 20151007
Lead Channel Impedance Value: 380 Ohm
Lead Channel Impedance Value: 410 Ohm
Lead Channel Pacing Threshold Amplitude: 0.625 V
Lead Channel Pacing Threshold Amplitude: 0.75 V
Lead Channel Pacing Threshold Pulse Width: 0.5 ms
Lead Channel Pacing Threshold Pulse Width: 0.5 ms
Lead Channel Sensing Intrinsic Amplitude: 12 mV
Lead Channel Sensing Intrinsic Amplitude: 2.2 mV
Lead Channel Setting Pacing Amplitude: 1.625
Lead Channel Setting Pacing Amplitude: 2.5 V
Lead Channel Setting Pacing Pulse Width: 0.5 ms
Lead Channel Setting Sensing Sensitivity: 0.5 mV
Pulse Gen Serial Number: 7225183

## 2019-08-03 ENCOUNTER — Ambulatory Visit: Payer: Medicare Other | Admitting: Physician Assistant

## 2019-08-08 ENCOUNTER — Ambulatory Visit (INDEPENDENT_AMBULATORY_CARE_PROVIDER_SITE_OTHER): Payer: Medicare Other | Admitting: Internal Medicine

## 2019-08-08 ENCOUNTER — Encounter: Payer: Self-pay | Admitting: Internal Medicine

## 2019-08-08 ENCOUNTER — Other Ambulatory Visit: Payer: Self-pay

## 2019-08-08 VITALS — BP 126/70 | HR 70 | Ht 66.0 in | Wt 155.8 lb

## 2019-08-08 DIAGNOSIS — Z95810 Presence of automatic (implantable) cardiac defibrillator: Secondary | ICD-10-CM

## 2019-08-08 DIAGNOSIS — I472 Ventricular tachycardia: Secondary | ICD-10-CM

## 2019-08-08 DIAGNOSIS — I255 Ischemic cardiomyopathy: Secondary | ICD-10-CM

## 2019-08-08 DIAGNOSIS — I4729 Other ventricular tachycardia: Secondary | ICD-10-CM

## 2019-08-08 NOTE — Progress Notes (Signed)
Pt not seen  Came in for device check I was very late and he felt the need to leave

## 2019-08-09 NOTE — Progress Notes (Signed)
Cardiology Office Note:    Date:  08/10/2019   ID:  [name] [name]., DOB 01-Apr-1929, MRN [id number]  PCP:  Cassandria Anger, MD  Cardiologist:  Mertie Moores, MD  Electrophysiologist:  Virl Axe, MD   Referring MD: Cassandria Anger, MD   Chief Complaint  Patient presents with  . Follow-up    CAD, CHF    History of Present Illness:    [name] [name]. is a 85 y.o. male with:   Coronary artery disease  ? S/p CABG in 1992 ? [place] 7/17: L-LAD ok, S-Lat ramus + S-RCA 100, severe ostial RI dz >> PCI:  DES x 2 to RI  Chronic systolic CHF ? Ischemic CM  ? Echocardiogram 02/2019: EF 30-35, Gr 2 DD, moderate MR ? Echocardiogram 7/18: EF 30-35, Gr 2 DD  Mild regurgitation  Chronotropic incompetence s/p pacer/ICD  Hx of syncope  NSVT  Hyperlipidemia   Mr. Slivinski was last seen in 05/2019.  He remained short of breath and a follow up BNP was elevated.  His Lasix dose was adjusted.  He saw Dr. Acie Fredrickson in 06/2019.  He remained shortness of breath and his nitrate dose was increased and he was placed on Ranolazine.     He returns for follow up.  He notes his pacer settings were changed recently.  With the pacer changes and the medication changes, he feels he is less short of breath.  He is able to do more activities now.  He has not had orthopnea, paroxysmal nocturnal dyspnea, chest pain, syncope.  His leg swelling is stable.       Prior CV studies:   The following studies were reviewed today:  Echocardiogram 03/15/2019 EF 30-35, mild LVH, Gr 2 DD, normal RVSF, mod LAE, mild MAC, mod MR, trivial AI  Echocardiogram 03/30/2017  Mod to severe LVH, EF 30-35, Gr 2 DD, trivial AI, mild to mod MR, mild to mod reduced RVSF, mild increased PASP   LHC 03/26/16 LAD mid 100% CTO RI ostial 99% LCx okay RCA proximal 100% LIMA-LAD okay SVG-lateral ramus 100% SVG-distal RCA 100% CTO EF 35-45% PCI:2.25 x 28 and 2.25 x 16 mm Promus Premier DES to the RI 1. Severe triple vessel s/p  3V CABG with 1/3 patent bypass grafts.  2. Occluded mid LAD with patent LIMA to LAD 3. Severe stenosis in the proximal segment of the moderate caliber Ramus branch. The vein graft to this branch is now occluded.  4. Chronic occlusion mid RCA. The vein graft to this branch is known to be occluded.  5. Moderate LV systolic dysfunction, chronic.  6. Successful PTCA/DES x 2 Ramus intermediate branch.     Echo 9/14 Mild LVH, mild focal basal septal hypertrophy, EF 35-40%, anteroseptal HK, normal diastolic function, mild MR, mild LAE, PASP 34 mmHg   Past Medical History:  Diagnosis Date  . AICD (automatic cardioverter/defibrillator) present   . Arrhythmia   . Arthritis    "hands, legs" (03/26/2016)  . 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 Chronic systolic CHF (congestive heart failure) ([place])    a. Echo 9/14: Mild LVH, mild focal basal septal hypertrophy, EF 35-40%, anteroseptal  HK, normal diastolic function, mild MR, mild LAE, PASP 34 mmHg  . Chronotropic incompetence    treated w/pacemaker, ICD 7.2008. This was placed after CPX done post 2008 cath. CPX showed only chronotropic incompetence  . History of blood transfusion 1969   "after getting MSO4; it destroys my corpuscles" (03/26/2016)  . History of colon polyps   . Hyperlipidemia   . Hypertension   . Migraine    "just before my bypass" (03/26/2016)  . Mitral regurgitation    mild  . Myocardial infarction ([place]) "several"  . Numbness and tingling of left arm and leg    improved after tx w/prednisone  . OSA (obstructive sleep apnea) 09/01/2016   Severe OSA with AHI 38/hr now on BiPAP at 21/17cm H2O  . Syncopal episodes    relative hypotension    Surgical Hx: The patient  has a past surgical history that includes Coronary artery bypass graft (1992); Cardiac catheterization (7342A-7681L X 4); Cardiac defibrillator placement (2008); Tibia fracture surgery (Left, 1943); Cataract extraction w/ intraocular lens  implant, bilateral (Bilateral, 08/2013 - 09/2013); implantable cardioverter defibrillator (icd) generator change (N/A, 06/21/2014); Back surgery; Posterior lumbar fusion (~ 1989); Coronary angioplasty with stent (~ 1992; 03/26/2016); Cystoscopy (1980s); Cardiac catheterization (N/A, 03/26/2016); Cardiac catheterization (N/A, 03/26/2016); Pars plana vitrectomy (Right, 06/18/2017); Gas/fluid exchange (Right, 06/18/2017); and Photocoagulation with laser (Right, 06/18/2017).   Current Medications: Current Meds  Medication Sig  . atorvastatin (LIPITOR) 40 MG tablet TAKE 1 TABLET DAILY  . carvedilol (COREG) 12.5 MG tablet TAKE 1 TABLET TWICE A DAY  . Cholecalciferol 1000 UNITS tablet Take 1,000 Units by mouth daily.    . clopidogrel (PLAVIX) 75 MG tablet Take 1 tablet (75 mg total) by mouth daily.  . furosemide (LASIX) 40 MG tablet Take 1.5 tablets (60 mg total) by mouth daily.  . irbesartan (AVAPRO) 150 MG tablet Take 0.5 tablets (75 mg total) by mouth daily. Take 1/2 of your 150 mg tablets daily  . isosorbide mononitrate (IMDUR) 120 MG 24 hr tablet Take 1 tablet (120 mg total) by mouth daily.  . Multiple Vitamins-Minerals (PRESERVISION AREDS 2) CAPS Take 1 capsule by mouth daily.   . nitroGLYCERIN (NITROLINGUAL) 0.4 MG/SPRAY spray Place 1 spray under the tongue every 5 (five) minutes x 3 doses as needed for chest pain.  . potassium chloride (KLOR-CON) 10 MEQ tablet Take 1 tablet (10 mEq total) by mouth daily.  . [DISCONTINUED] ranolazine (RANEXA) 500 MG 12 hr tablet Take 1 tablet (500 mg total) by mouth 2 (two) times daily.     Allergies:   Morphine and Ramipril   Social History   Tobacco Use  . Smoking status: Former Smoker    Years:  29.00    Types: Cigars  . Smokeless tobacco: Never Used  . Tobacco comment: quit smoking cigars in 1973  Substance Use Topics  . Alcohol use: Not Currently    Comment: "quit drinking in 1973"  . Drug use: No     Family Hx: The patient's family history includes Colon cancer in an other family member; Hypertension in his father, mother, and another family member.  ROS:   Please see the history of present illness.    ROS All other systems reviewed and are negative.   EKGs/Labs/Other Test Reviewed:    EKG:  EKG is  ordered today.  The ekg ordered today demonstrates A paced, HR 70, LAD, IVCD, TW inversions in 3, aVF, V5-6, ant-sept Q waves, QTc 442, similar to prior tracings   Recent  Labs: 05/25/2019: ALT 26; Hemoglobin 11.3; Platelets 229 05/31/2019: BUN 37; Creatinine, Ser 1.31; NT-Pro BNP 1,027; Potassium 5.0; Sodium 136   Recent Lipid Panel Lab Results  Component Value Date/Time   CHOL 90 (L) 06/28/2019 10:57 AM   TRIG 119 06/28/2019 10:57 AM   HDL 36 (L) 06/28/2019 10:57 AM   CHOLHDL 2.5 06/28/2019 10:57 AM   CHOLHDL 4 12/24/2016 11:03 AM   LDLCALC 32 06/28/2019 10:57 AM   LDLDIRECT 35.0 12/24/2016 11:03 AM    Physical Exam:    VS:  BP 122/64   Pulse 85   Ht _0  (1.676 m)   Wt 157 lb 1.9 oz (71.3 kg)   SpO2 98%   BMI 25.36 kg/m     Wt Readings from Last 3 Encounters:  08/10/19 157 lb 1.9 oz (71.3 kg)  08/08/19 155 lb 12.8 oz (70.7 kg)  07/28/19 161 lb (73 kg)     Physical Exam  Constitutional: He is oriented to person, place, and time. He appears well-developed and well-nourished. No distress.  HENT:  Head: Normocephalic and atraumatic.  Eyes: No scleral icterus.  Neck: No JVD present. No thyromegaly present.  Cardiovascular: Normal rate and regular rhythm.  Murmur heard.  Holosystolic murmur is present with a grade of 2/6 at the lower left sternal border. Pulmonary/Chest: Effort normal and breath sounds normal. He has no rales.  Abdominal: Soft. There  is no hepatomegaly.  Musculoskeletal:        General: Edema (tr-1+ bilat ankle edema) present.  Lymphadenopathy:    He has no cervical adenopathy.  Neurological: He is alert and oriented to person, place, and time.  Skin: Skin is warm and dry.  Psychiatric: He has a normal mood and affect.    ASSESSMENT & PLAN:    1. Chronic systolic CHF (congestive heart failure) (HCC) EF 30-35 by echocardiogram in June 2020.  NYHA 2b-3.  His volume appears stable.  Continue Carvedilol, Furosemide, Irbesartan, Isosorbide.  FU with Dr. Acie Fredrickson in April 2021 as planned.   2. Coronary artery disease involving native coronary artery of native heart with angina pectoris ([place]) Hx of CABG.  2/3 grafts occluded at cath in 2017.  He underwent DES x 2 to RI at that time.  His nitrates were increased at his last visit and he was started on Ranolazine.  He is feeling better with less shortness of breath.  His QTc is stable.  Therefore, I will increase his Ranolazine to 1000 mg twice daily.  He will return in 2-4 weeks for an ECG and see Dr. Acie Fredrickson in April 2021.  Continue Clopidogrel, Carvedilol, Isosorbide, Ranolazine.    3. Essential hypertension The patient's blood pressure is controlled on his current regimen.  Continue current therapy.    4. Other hyperlipidemia LDL optimal on most recent lab work.  Continue current Rx.    5. ICD (implantable cardioverter-defibrillator) in place FU with EP as planned.     Dispo:  Return in about 5 months (around 01/08/2020) for Routine Follow Up, w/ Dr. Acie Fredrickson, (virtual or in-person).   Medication Adjustments/Labs and Tests Ordered: Current medicines are reviewed at length with the patient today.  Concerns regarding medicines are outlined above.  Tests Ordered: No orders of the defined types were placed in this encounter.  Medication Changes: Meds ordered this encounter  Medications  . ranolazine (RANEXA) 1000 MG SR tablet    Sig: Take 1 tablet (1,000 mg total) by  mouth 2 (two) times daily.    Dispense:  180 tablet    Refill:  3    Signed, Richardson Dopp, PA-C  08/10/2019 1:30 PM    [facility] [address], [place], [place]  [address] Phone: [phone]; Fax: [phone]

## 2019-08-10 ENCOUNTER — Ambulatory Visit (INDEPENDENT_AMBULATORY_CARE_PROVIDER_SITE_OTHER): Payer: Medicare Other | Admitting: Physician Assistant

## 2019-08-10 ENCOUNTER — Other Ambulatory Visit: Payer: Self-pay

## 2019-08-10 ENCOUNTER — Encounter: Payer: Self-pay | Admitting: Physician Assistant

## 2019-08-10 VITALS — BP 122/64 | HR 85 | Ht 66.0 in | Wt 157.1 lb

## 2019-08-10 DIAGNOSIS — I5022 Chronic systolic (congestive) heart failure: Secondary | ICD-10-CM | POA: Diagnosis not present

## 2019-08-10 DIAGNOSIS — I25119 Atherosclerotic heart disease of native coronary artery with unspecified angina pectoris: Secondary | ICD-10-CM | POA: Diagnosis not present

## 2019-08-10 DIAGNOSIS — Z95810 Presence of automatic (implantable) cardiac defibrillator: Secondary | ICD-10-CM | POA: Diagnosis not present

## 2019-08-10 DIAGNOSIS — I10 Essential (primary) hypertension: Secondary | ICD-10-CM | POA: Diagnosis not present

## 2019-08-10 DIAGNOSIS — E7849 Other hyperlipidemia: Secondary | ICD-10-CM | POA: Diagnosis not present

## 2019-08-10 LAB — CUP PACEART INCLINIC DEVICE CHECK
Battery Remaining Longevity: 42 mo
Brady Statistic RA Percent Paced: 95 %
Brady Statistic RV Percent Paced: 2.8 %
Date Time Interrogation Session: 20201123162200
HighPow Impedance: 81 Ohm
Implantable Lead Implant Date: 20080711
Implantable Lead Implant Date: 20080711
Implantable Lead Location: 753859
Implantable Lead Location: 753860
Implantable Lead Model: 7122
Implantable Pulse Generator Implant Date: 20151007
Lead Channel Impedance Value: 375 Ohm
Lead Channel Impedance Value: 412.5 Ohm
Lead Channel Pacing Threshold Amplitude: 0.5 V
Lead Channel Pacing Threshold Amplitude: 0.75 V
Lead Channel Pacing Threshold Pulse Width: 0.5 ms
Lead Channel Pacing Threshold Pulse Width: 0.5 ms
Lead Channel Sensing Intrinsic Amplitude: 12 mV
Lead Channel Sensing Intrinsic Amplitude: 2.3 mV
Lead Channel Setting Pacing Amplitude: 1.75 V
Lead Channel Setting Pacing Amplitude: 2.5 V
Lead Channel Setting Pacing Pulse Width: 0.5 ms
Lead Channel Setting Sensing Sensitivity: 0.5 mV
Pulse Gen Serial Number: 7225183

## 2019-08-10 MED ORDER — RANOLAZINE ER 1000 MG PO TB12: 1000.0000 mg | ORAL_TABLET | Freq: Two times a day (BID) | ORAL | 3 refills | Status: DC

## 2019-08-10 NOTE — Patient Instructions (Signed)
Medication Instructions:  INCREASE: Ranolazine (Ranexa) to 1,000 mg twice a day   *If you need a refill on your cardiac medications before your next appointment, please call your pharmacy*  Lab Work: None   If you have labs (blood work) drawn today and your tests are completely normal, you will receive your results only by: Marland Kitchen MyChart Message (if you have MyChart) OR . A paper copy in the mail If you have any lab test that is abnormal or we need to change your treatment, we will call you to review the results.  Testing/Procedures: None   Follow-Up: At [facility], you and your health needs are our priority.  As part of our continuing mission to provide you with exceptional heart care, we have created designated Provider Care Teams.  These Care Teams include your primary Cardiologist (physician) and Advanced Practice Providers (APPs -  Physician Assistants and Nurse Practitioners) who all work together to provide you with the care you need, when you need it.  Your next appointment:   5 month(s)  The format for your next appointment:   Either In Person or Virtual  Provider:   You may see Mertie Moores, MD or one of the following Advanced Practice Providers on your designated Care Team:    Richardson Dopp, PA-C  [place], [place]  Daune Perch, [place]   You are scheduled to come into our office on 08/24/2019 @ 11:00 AM for a EKG   Other Instructions

## 2019-08-16 NOTE — Addendum Note (Signed)
Addended by: Mendel Ryder on: 08/16/2019 08:17 AM   Modules accepted: Orders

## 2019-08-24 ENCOUNTER — Encounter (INDEPENDENT_AMBULATORY_CARE_PROVIDER_SITE_OTHER): Payer: Self-pay

## 2019-08-24 ENCOUNTER — Ambulatory Visit (INDEPENDENT_AMBULATORY_CARE_PROVIDER_SITE_OTHER): Payer: Medicare Other | Admitting: *Deleted

## 2019-08-24 ENCOUNTER — Other Ambulatory Visit: Payer: Self-pay

## 2019-08-24 VITALS — BP 110/58 | HR 70 | Ht 66.0 in | Wt 159.0 lb

## 2019-08-24 DIAGNOSIS — R0602 Shortness of breath: Secondary | ICD-10-CM | POA: Diagnosis not present

## 2019-08-24 DIAGNOSIS — G4733 Obstructive sleep apnea (adult) (pediatric): Secondary | ICD-10-CM | POA: Diagnosis not present

## 2019-08-24 NOTE — Progress Notes (Signed)
1.  Reason for visit: EKG post Ranexa dose increase  2.  Name of MD requesting visit:  Weaver  3. H&P:  See epic    4.  ROS related to problem:  See epic  5.  Assessment and plan per MD:   EKG performed, NSR, HR 70 Pt reports not sleeping well since starting Ranexa, no other complaints. Reviewed by DOD, Dr. Johnsie Cancel, no orders received, continue current treatment plan.

## 2019-08-24 NOTE — Patient Instructions (Signed)
Medication Instructions:  CONTINUE ON CURRENT MEDICATIONS REGIMEN *If you need a refill on your cardiac medications before your next appointment, please call your pharmacy*  Lab Work: NONE ORDERED TODAY If you have labs (blood work) drawn today and your tests are completely normal, you will receive your results only by: Marland Kitchen MyChart Message (if you have MyChart) OR . A paper copy in the mail If you have any lab test that is abnormal or we need to change your treatment, we will call you to review the results.  Testing/Procedures: NONE ORDERED TODAY  Follow-Up: At [facility], you and your health needs are our priority.  As part of our continuing mission to provide you with exceptional heart care, we have created designated Provider Care Teams.  These Care Teams include your primary Cardiologist (physician) and Advanced Practice Providers (APPs -  Physician Assistants and Nurse Practitioners) who all work together to provide you with the care you need, when you need it.  Your next appointment:   2 month(s) ON 10/19/19 @ 10:40 AM   The format for your next appointment:   In Person  Provider:   Fransico Him, MD  Other Instructions

## 2019-08-27 NOTE — Progress Notes (Signed)
Remote ICD transmission.   

## 2019-10-01 ENCOUNTER — Other Ambulatory Visit: Payer: Self-pay | Admitting: Cardiovascular Disease

## 2019-10-13 ENCOUNTER — Telehealth: Payer: Self-pay | Admitting: *Deleted

## 2019-10-13 NOTE — Telephone Encounter (Signed)

## 2019-10-18 NOTE — Progress Notes (Signed)
Virtual Visit via Telephone Note   This visit type was conducted due to national recommendations for restrictions regarding the COVID-19 Pandemic (e.g. social distancing) in an effort to limit this patient's exposure and mitigate transmission in our community.  Due to his co-morbid illnesses, this patient is at least at moderate risk for complications without adequate follow up.  This format is felt to be most appropriate for this patient at this time.  All issues noted in this document were discussed and addressed.  A limited physical exam was performed with this format.  Please refer to the patient's chart for his consent to telehealth for [facility].   Evaluation Performed:  Follow-up visit  This visit type was conducted due to national recommendations for restrictions regarding the COVID-19 Pandemic (e.g. social distancing).  This format is felt to be most appropriate for this patient at this time.  All issues noted in this document were discussed and addressed.  No physical exam was performed (except for noted visual exam findings with Video Visits).  Please refer to the patient's chart (MyChart message for video visits and phone note for telephone visits) for the patient's consent to telehealth for [facility].  Date:  10/19/2019   ID:  [name] Curia., DOB 24-Dec-1928, MRN [id number]  Patient Location:  Home  Provider location:   [place]  PCP:  Sueanne Margarita, MD  Cardiologist:  Mertie Moores, MD  Sleep Medicine:  Fransico Him, MD Electrophysiologist:  Virl Axe, MD   Chief Complaint:  OSA  History of Present Illness:    [name] Balbin. is a 86 y.o. male who presents via audio/video conferencing for a telehealth visit today.    [name] [name] is a 86 y.o. male with a hx of severe OSA with an AHI of 38.5/hr and is now on BiPAP. He is doing well with his CPAP device and thinks that he has gotten used to it.  He tolerates the mask and feels that at times the pressure  is too much.  He is having problems with getting to sleep at night and staying asleep. He does not nap during the day.  He goes to bed at 10:30pm and falls to sleep around 12MN and then gets up around 3:30am and occasionally will fall back asleep for an hour or 2.  He drinks caffeine up until 8pm at night. He has some mouth dryness in the am but no  nasal dryness or nasal congestion.  He does not think that he snores.    The patient does not have symptoms concerning for COVID-19 infection (fever, chills, cough, or new shortness of breath).   Prior CV studies:   The following studies were reviewed today:  PAP compliance download  Past Medical History:  Diagnosis Date  . AICD (automatic cardioverter/defibrillator) present   . Arrhythmia   . Arthritis    "hands, legs" (03/26/2016)  . 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 Chronic systolic CHF (congestive heart failure) ([place])    a. Echo 9/14: Mild LVH, mild focal basal septal hypertrophy, EF 35-40%, anteroseptal HK, normal diastolic function, mild MR, mild LAE, PASP 34 mmHg  .  Chronotropic incompetence    treated w/pacemaker, ICD 7.2008. This was placed after CPX done post 2008 cath. CPX showed only chronotropic incompetence  . History of blood transfusion 1969   "after getting MSO4; it destroys my corpuscles" (03/26/2016)  . History of colon polyps   . Hyperlipidemia   . Hypertension   . Migraine    "just before my bypass" (03/26/2016)  . Mitral regurgitation    mild  . Myocardial infarction ([place]) "several"  . Numbness and tingling of left arm and leg    improved after tx w/prednisone  . OSA (obstructive sleep apnea) 09/01/2016   Severe OSA with AHI 38/hr  now on BiPAP at 21/17cm H2O  . Syncopal episodes    relative hypotension   Past Surgical History:  Procedure Laterality Date  . BACK SURGERY    . CARDIAC CATHETERIZATION  1980s-1990s X 4  . CARDIAC CATHETERIZATION N/A 03/26/2016   Procedure: Left Heart Cath and Cors/Grafts Angiography;  Surgeon: Burnell Blanks, MD;  Location: [facility];  Service: Cardiovascular;  Laterality: N/A;  . CARDIAC CATHETERIZATION N/A 03/26/2016   Procedure: Coronary Stent Intervention;  Surgeon: Burnell Blanks, MD;  Location: [facility];  Service: Cardiovascular;  Laterality: N/A;  . CARDIAC DEFIBRILLATOR PLACEMENT  2008  . CATARACT EXTRACTION W/ INTRAOCULAR LENS  IMPLANT, BILATERAL Bilateral 08/2013 - 09/2013   right - left   . CORONARY ANGIOPLASTY WITH STENT PLACEMENT  ~ 1992; 03/26/2016   "1 + 2"  . CORONARY ARTERY BYPASS GRAFT  1992   CABG "X4"  . CYSTOSCOPY  1980s  . GAS/FLUID EXCHANGE Right 06/18/2017   Procedure: GAS/FLUID EXCHANGE (C3F8);  Surgeon: Sherlynn Stalls, MD;  Location: [facility];  Service: Ophthalmology;  Laterality: Right;  . IMPLANTABLE CARDIOVERTER DEFIBRILLATOR (ICD) GENERATOR CHANGE N/A 06/21/2014   Procedure: ICD GENERATOR CHANGE;  Surgeon: Deboraha Sprang, MD;  Location: [facility];  Service: Cardiovascular;  Laterality: N/A;  . PARS PLANA VITRECTOMY Right 06/18/2017   Procedure: PARS PLANA VITRECTOMY WITH 25 GAUGE;  Surgeon: Sherlynn Stalls, MD;  Location: [facility];  Service: Ophthalmology;  Laterality: Right;  . PHOTOCOAGULATION WITH LASER Right 06/18/2017   Procedure: PHOTOCOAGULATION WITH LASER- ENDO LASER;  Surgeon: Sherlynn Stalls, MD;  Location: [facility];  Service: Ophthalmology;  Laterality: Right;  . POSTERIOR LUMBAR FUSION  ~ 1989   "fused 2 discs"  . TIBIA FRACTURE SURGERY Left 1943   "put artificial bone in there"     Current Meds  Medication Sig  . atorvastatin (LIPITOR) 40 MG tablet TAKE 1 TABLET DAILY  . carvedilol (COREG) 12.5 MG tablet TAKE 1 TABLET  TWICE A DAY  . Cholecalciferol 1000 UNITS tablet Take 1,000 Units by mouth daily.    . clopidogrel (PLAVIX) 75 MG tablet TAKE 1 TABLET DAILY  . furosemide (LASIX) 40 MG tablet Take 1.5 tablets (60 mg total) by mouth daily.  . irbesartan (AVAPRO) 150 MG tablet Take 0.5 tablets (75 mg total) by mouth daily. Take 1/2 of your 150 mg tablets daily  . isosorbide mononitrate (IMDUR) 120 MG 24 hr tablet Take 1 tablet (120 mg total) by mouth daily.  . Multiple Vitamins-Minerals (PRESERVISION AREDS 2) CAPS Take 1 capsule by mouth daily.   . nitroGLYCERIN (NITROLINGUAL) 0.4 MG/SPRAY spray Place 1 spray under the tongue every 5 (five) minutes x 3 doses as needed for chest pain.  . potassium chloride (KLOR-CON) 10 MEQ tablet Take 1 tablet (10 mEq total) by mouth daily.  . ranolazine (RANEXA)  1000 MG SR tablet Take 1 tablet (1,000 mg total) by mouth 2 (two) times daily.     Allergies:   Morphine and Ramipril   Social History   Tobacco Use  . Smoking status: Former Smoker    Years: 29.00    Types: Cigars  . Smokeless tobacco: Never Used  . Tobacco comment: quit smoking cigars in 1973  Substance Use Topics  . Alcohol use: Not Currently    Comment: "quit drinking in 1973"  . Drug use: No     Family Hx: The patient's family history includes Colon cancer in an other family member; Hypertension in his father, mother, and another family member.  ROS:   Please see the history of present illness.     All other systems reviewed and are negative.   Labs/Other Tests and Data Reviewed:    Recent Labs: 05/25/2019: ALT 26; Hemoglobin 11.3; Platelets 229 05/31/2019: BUN 37; Creatinine, Ser 1.31; NT-Pro BNP 1,027; Potassium 5.0; Sodium 136   Recent Lipid Panel Lab Results  Component Value Date/Time   CHOL 90 (L) 06/28/2019 10:57 AM   TRIG 119 06/28/2019 10:57 AM   HDL 36 (L) 06/28/2019 10:57 AM   CHOLHDL 2.5 06/28/2019 10:57 AM   CHOLHDL 4 12/24/2016 11:03 AM   LDLCALC 32 06/28/2019 10:57 AM    LDLDIRECT 35.0 12/24/2016 11:03 AM    Wt Readings from Last 3 Encounters:  10/19/19 158 lb (71.7 kg)  08/24/19 159 lb (72.1 kg)  08/10/19 157 lb 1.9 oz (71.3 kg)     Objective:    Vital Signs:  BP (!) 110/58   Pulse 81   Ht 5\' 6"  (1.676 m)   Wt 158 lb (71.7 kg)   BMI 25.50 kg/m     ASSESSMENT & PLAN:    1.  OSA -   The patient is tolerating PAP therapy well without any problems. The PAP download was reviewed today and showed an AHI of 1.4/hr on 20/16 cm H2O with 100% compliance in using more than 4 hours nightly.  The patient has been using and benefiting from PAP use and will continue to benefit from therapy. He is complaining of his pressure being too high and his AHI is good.  I will decrease the BiPAP to 18/14cm H2O and get a download in 2 weeks.   2.  Insomnia -likely related to too much caffeine before bed -I encouraged him to only drink caffeine once in the am and not during the day -given his advanced age I explained to him that it would not be safe to given him a sleep aide  3.  HTN -BP controlled -continue Carvedilol 12.5mg  BID and Irbesartan 75mg  daily.  4.  ASCAD -denies any anginal sx -continue long acting nitrates, Plavix 75mg  daily, BB, Ranexa and statin  COVID-19 Education: The signs and symptoms of COVID-19 were discussed with the patient and how to seek care for testing (follow up with PCP or arrange E-visit).  The importance of social distancing was discussed today.  Patient Risk:   After full review of this patient's clinical status, I feel that they are at least moderate risk at this time.  Time:   Today, I have spent 15 minutes directly with the patient on telemediciner discussing medical problems including OSA< HTN, CAD.  We also reviewed the symptoms of COVID 19 and the ways to protect against contracting the virus with telehealth technology.  I spent an additional 5 minutes reviewing patient's chart including labs, PAP compliance  download.  Medication Adjustments/Labs and Tests Ordered: Current medicines are reviewed at length with the patient today.  Concerns regarding medicines are outlined above.  Tests Ordered: No orders of the defined types were placed in this encounter.  Medication Changes: No orders of the defined types were placed in this encounter.   Disposition:  Follow up in 1 year(s)  Signed, Fransico Him, MD  10/19/2019 10:27 AM    [facility]

## 2019-10-19 ENCOUNTER — Other Ambulatory Visit: Payer: Self-pay

## 2019-10-19 ENCOUNTER — Telehealth: Payer: Self-pay | Admitting: *Deleted

## 2019-10-19 ENCOUNTER — Encounter: Payer: Self-pay | Admitting: Cardiology

## 2019-10-19 ENCOUNTER — Telehealth (INDEPENDENT_AMBULATORY_CARE_PROVIDER_SITE_OTHER): Payer: Medicare Other | Admitting: Cardiology

## 2019-10-19 VITALS — BP 110/58 | HR 81 | Ht 66.0 in | Wt 158.0 lb

## 2019-10-19 DIAGNOSIS — F5101 Primary insomnia: Secondary | ICD-10-CM | POA: Diagnosis not present

## 2019-10-19 DIAGNOSIS — I2510 Atherosclerotic heart disease of native coronary artery without angina pectoris: Secondary | ICD-10-CM | POA: Diagnosis not present

## 2019-10-19 DIAGNOSIS — G4733 Obstructive sleep apnea (adult) (pediatric): Secondary | ICD-10-CM | POA: Diagnosis not present

## 2019-10-19 DIAGNOSIS — I10 Essential (primary) hypertension: Secondary | ICD-10-CM | POA: Diagnosis not present

## 2019-10-19 NOTE — Telephone Encounter (Signed)
-----   Message from Sueanne Margarita, MD sent at 10/19/2019 10:27 AM EST ----- Please change BiPAP to 18/14cm H2O and get a download in 2 weeks.  Followup with me in 1 year

## 2019-10-19 NOTE — Telephone Encounter (Signed)
Order placed to [employer] via community message. 

## 2019-10-31 ENCOUNTER — Ambulatory Visit (INDEPENDENT_AMBULATORY_CARE_PROVIDER_SITE_OTHER): Payer: Medicare Other | Admitting: *Deleted

## 2019-10-31 DIAGNOSIS — I255 Ischemic cardiomyopathy: Secondary | ICD-10-CM | POA: Diagnosis not present

## 2019-10-31 LAB — CUP PACEART REMOTE DEVICE CHECK
Battery Remaining Longevity: 37 mo
Battery Remaining Percentage: 44 %
Battery Voltage: 2.89 V
Brady Statistic AP VP Percent: 3.8 %
Brady Statistic AP VS Percent: 93 %
Brady Statistic AS VP Percent: 1 %
Brady Statistic AS VS Percent: 2.7 %
Brady Statistic RA Percent Paced: 96 %
Brady Statistic RV Percent Paced: 3.8 %
Date Time Interrogation Session: 20210215025939
HighPow Impedance: 66 Ohm
HighPow Impedance: 66 Ohm
Implantable Lead Implant Date: 20080711
Implantable Lead Implant Date: 20080711
Implantable Lead Location: 753859
Implantable Lead Location: 753860
Implantable Lead Model: 7122
Implantable Pulse Generator Implant Date: 20151007
Lead Channel Impedance Value: 360 Ohm
Lead Channel Impedance Value: 390 Ohm
Lead Channel Pacing Threshold Amplitude: 0.5 V
Lead Channel Pacing Threshold Amplitude: 0.625 V
Lead Channel Pacing Threshold Pulse Width: 0.5 ms
Lead Channel Pacing Threshold Pulse Width: 0.5 ms
Lead Channel Sensing Intrinsic Amplitude: 11.6 mV
Lead Channel Sensing Intrinsic Amplitude: 2.4 mV
Lead Channel Setting Pacing Amplitude: 1.625
Lead Channel Setting Pacing Amplitude: 2.5 V
Lead Channel Setting Pacing Pulse Width: 0.5 ms
Lead Channel Setting Sensing Sensitivity: 0.5 mV
Pulse Gen Serial Number: 7225183

## 2019-11-01 NOTE — Progress Notes (Signed)
ICD remote 

## 2019-11-22 DIAGNOSIS — G4733 Obstructive sleep apnea (adult) (pediatric): Secondary | ICD-10-CM | POA: Diagnosis not present

## 2019-12-01 DIAGNOSIS — H353111 Nonexudative age-related macular degeneration, right eye, early dry stage: Secondary | ICD-10-CM | POA: Diagnosis not present

## 2019-12-01 DIAGNOSIS — H353223 Exudative age-related macular degeneration, left eye, with inactive scar: Secondary | ICD-10-CM | POA: Diagnosis not present

## 2019-12-01 DIAGNOSIS — H43811 Vitreous degeneration, right eye: Secondary | ICD-10-CM | POA: Diagnosis not present

## 2019-12-01 DIAGNOSIS — H524 Presbyopia: Secondary | ICD-10-CM | POA: Diagnosis not present

## 2019-12-01 DIAGNOSIS — H35372 Puckering of macula, left eye: Secondary | ICD-10-CM | POA: Diagnosis not present

## 2019-12-12 DIAGNOSIS — H353221 Exudative age-related macular degeneration, left eye, with active choroidal neovascularization: Secondary | ICD-10-CM | POA: Diagnosis not present

## 2019-12-12 DIAGNOSIS — H353112 Nonexudative age-related macular degeneration, right eye, intermediate dry stage: Secondary | ICD-10-CM | POA: Diagnosis not present

## 2019-12-12 DIAGNOSIS — H31093 Other chorioretinal scars, bilateral: Secondary | ICD-10-CM | POA: Diagnosis not present

## 2019-12-12 DIAGNOSIS — H35373 Puckering of macula, bilateral: Secondary | ICD-10-CM | POA: Diagnosis not present

## 2019-12-27 ENCOUNTER — Other Ambulatory Visit: Payer: Medicare Other

## 2019-12-27 ENCOUNTER — Ambulatory Visit (INDEPENDENT_AMBULATORY_CARE_PROVIDER_SITE_OTHER): Payer: Medicare Other | Admitting: Cardiovascular Disease

## 2019-12-27 ENCOUNTER — Other Ambulatory Visit: Payer: Self-pay

## 2019-12-27 ENCOUNTER — Encounter: Payer: Self-pay | Admitting: Cardiovascular Disease

## 2019-12-27 VITALS — BP 110/60 | HR 74 | Ht 66.0 in | Wt 156.8 lb

## 2019-12-27 DIAGNOSIS — Z95810 Presence of automatic (implantable) cardiac defibrillator: Secondary | ICD-10-CM

## 2019-12-27 DIAGNOSIS — I2510 Atherosclerotic heart disease of native coronary artery without angina pectoris: Secondary | ICD-10-CM | POA: Diagnosis not present

## 2019-12-27 DIAGNOSIS — I200 Unstable angina: Secondary | ICD-10-CM | POA: Diagnosis not present

## 2019-12-27 DIAGNOSIS — I10 Essential (primary) hypertension: Secondary | ICD-10-CM | POA: Diagnosis not present

## 2019-12-27 DIAGNOSIS — E785 Hyperlipidemia, unspecified: Secondary | ICD-10-CM

## 2019-12-27 DIAGNOSIS — I5022 Chronic systolic (congestive) heart failure: Secondary | ICD-10-CM

## 2019-12-27 DIAGNOSIS — I340 Nonrheumatic mitral (valve) insufficiency: Secondary | ICD-10-CM

## 2019-12-27 LAB — BASIC METABOLIC PANEL
BUN/Creatinine Ratio: 21 (ref 10–24)
BUN: 31 mg/dL — ABNORMAL HIGH (ref 8–27)
CO2: 25 mmol/L (ref 20–29)
Calcium: 8.9 mg/dL (ref 8.6–10.2)
Chloride: 102 mmol/L (ref 96–106)
Creatinine, Ser: 1.47 mg/dL — ABNORMAL HIGH (ref 0.76–1.27)
GFR calc Af Amer: 49 mL/min/{1.73_m2} — ABNORMAL LOW (ref >59)
GFR calc non Af Amer: 42 mL/min/{1.73_m2} — ABNORMAL LOW (ref >59)
Glucose: 128 mg/dL — ABNORMAL HIGH (ref 65–99)
Potassium: 4.6 mmol/L (ref 3.5–5.2)
Sodium: 139 mmol/L (ref 134–144)

## 2019-12-27 LAB — HEPATIC FUNCTION PANEL
ALT: 13 IU/L (ref 0–44)
AST: 13 IU/L (ref 0–40)
Albumin: 4.1 g/dL (ref 3.6–4.6)
Alkaline Phosphatase: 147 IU/L — ABNORMAL HIGH (ref 39–117)
Bilirubin Total: 0.8 mg/dL (ref 0.0–1.2)
Bilirubin, Direct: 0.36 mg/dL (ref 0.00–0.40)
Total Protein: 6.2 g/dL (ref 6.0–8.5)

## 2019-12-27 LAB — LIPID PANEL
Chol/HDL Ratio: 2.5 ratio (ref 0.0–5.0)
Cholesterol, Total: 88 mg/dL — ABNORMAL LOW (ref 100–199)
HDL: 35 mg/dL — ABNORMAL LOW (ref >39)
LDL Chol Calc (NIH): 32 mg/dL (ref 0–99)
Triglycerides: 117 mg/dL (ref 0–149)
VLDL Cholesterol Cal: 21 mg/dL (ref 5–40)

## 2019-12-27 NOTE — Patient Instructions (Signed)
Medication Instructions:  Your physician recommends that you continue on your current medications as directed. Please refer to the Current Medication list given to you today.  *If you need a refill on your cardiac medications before your next appointment, please call your pharmacy*   Lab Work: Done Today - cholesterol, liver panel, kidney function and electrolytes If you have labs (blood work) drawn today and your tests are completely normal, you will receive your results only by: Marland Kitchen MyChart Message (if you have MyChart) OR . A paper copy in the mail If you have any lab test that is abnormal or we need to change your treatment, we will call you to review the results.   Testing/Procedures: None ordered    Follow-Up: At [facility], you and your health needs are our priority.  As part of our continuing mission to provide you with exceptional heart care, we have created designated Provider Care Teams.  These Care Teams include your primary Cardiologist (physician) and Advanced Practice Providers (APPs -  Physician Assistants and Nurse Practitioners) who all work together to provide you with the care you need, when you need it.  We recommend signing up for the patient portal called "MyChart".  Sign up information is provided on this After Visit Summary.  MyChart is used to connect with patients for Virtual Visits (Telemedicine).  Patients are able to view lab/test results, encounter notes, upcoming appointments, etc.  Non-urgent messages can be sent to your provider as well.   To learn more about what you can do with MyChart, go to [email or web address].    Your next appointment:   6 month(s)  The format for your next appointment:   Either In Person or Virtual  Provider:   Richardson Dopp, PA-C or Robbie Lis, PA-C

## 2019-12-27 NOTE — Progress Notes (Signed)
Cardiology Office Note   Date:  12/27/2019   ID:  [name] Curia., DOB 20-Oct-1928, MRN [id number]  PCP:  Sueanne Margarita, MD  Cardiologist:  Mertie Moores, MD   Problem list 1. Coronary artery disease-status post coronary artery bypass grafting 2. Hyperlipidemia 3. Chronic systolic congestive heart failure - EF 35-40% 4. ICD -  5.  OSA - Fransico Him, MD   Chief Complaint  Patient presents with  . Hyperlipidemia      Notes prior to 2016  [name] Wald. is a 86 y.o. male who presents today to follow-up coronary disease and ischemic cardiomyopathy and a history of hypertension. He did have an episode of syncope in the past that was related to hypotension. More recently his blood pressure has been elevated. He was hesitant to have his ramapril dose increased because he associated this with his syncopal episode in the past. Losartan was added to his medications. He is getting excellent control and I have left him on both. He feels good. There is no chest pain. He is fully active. He has an ICD in place that is followed carefully by electrophysiology.  Retired [employer] ( [place] )   Nov. 1, 2016:  Doing well.  No CP or dyspnea. 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   January 08, 2016:  See for follow up for his CAD / CABG, chronic systolic CHF , HTN Doing well.    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   Stays active  Does not ride anymore.  Has some fatigue , no CP   Jan. 4, 2018:  Still taking care of his horses.   Feeds and hay twice  No CP , no dyspnea.   Aug. 29, 2018:  [name] [name] is doing well from a cardiac standpoint  Has leg pain . 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 25, 2019:  Doing well .  Gets fatigued when he goes down to take care of the horses.  No CP or dyspnea .  Some DOE with working   May 25, 2018: Seen for follow  Still having some chest pain .   Lasted  30 minutes.    Took NTG with relief. Chest tightness with activity Has known severe CAD  With patent LIMA. Had 2 DES stents to his ramus Int. In 2017.  His symptoms are not nearly as severe as they were in 2017 when he was sent to the hospital and had stents placed in the ramus intermediate branch.  September 03, 2018: [name] [name] is seen today for follow-up of his coronary artery disease He has had an ICD placed for primary prevention. Was having some dyspnea,   Dr. Caryl Comes increased Imdur to 60  Mg a day which has helped.   Has swelling   March 08, 2019 :  I saw [name] [name] as a telemedicine visit on Jan 25, 2019 .  Having more chest pressure with exertion .  Has marked DOE , when dressing  Legs feel heavy ,  Has leg edema Avoids salty foods.  Takes NTG which has helped the chest tightness.  The IMdur has helped.   June 28, 2019:  [name] [name] is seen today for follow-up visit.  He has a history of coronary artery disease and is status post coronary artery bypass grafting.  He has an ICD placed for  primary prevention. Gets DOE with getting dressed and working with the horses.  I reviewed his cardiac catheterization films from 2017.  He has a patent LIMA to the distal LAD.  He has patent circumflex artery.  There is disease in some of the small branches.  That the RCA is occluded.  Saphenous vein grafts are occluded.  Avoids salty foods.   Will increase Imdur to 120 mg BID and will add Ranexa 500 mg PO BID today   December 27, 2019: [name] [name] is an 86 year old gentleman with a history of coronary artery disease. Complains of not sleeping well at night  Complains of lack of energy  No CP ,  No dyspnea,  Just fatigue his VS look good today  He was started on Ranexa about a year ago .  Seems to be helping with his angina  I gave his the OK to have Avastin injections into his eye   Past Medical History:  Diagnosis Date  . AICD (automatic cardioverter/defibrillator) present   . Arrhythmia   .  Arthritis    "hands, legs" (03/26/2016)  . 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 Chronic systolic CHF (congestive heart failure) ([place])    a. Echo 9/14: Mild LVH, mild focal basal septal hypertrophy, EF 35-40%, anteroseptal HK, normal diastolic function, mild MR, mild LAE, PASP 34 mmHg  . Chronotropic incompetence    treated w/pacemaker, ICD 7.2008. This was placed after CPX done post 2008 cath. CPX showed only chronotropic incompetence  . History of blood transfusion 1969   "after getting MSO4; it destroys my corpuscles" (03/26/2016)  . History of colon polyps   . Hyperlipidemia   . Hypertension   . Migraine    "just before my bypass" (03/26/2016)  . Mitral regurgitation    mild  . Myocardial infarction ([place]) "several"  . Numbness and tingling of left arm and leg    improved after tx w/prednisone  . OSA (obstructive sleep apnea) 09/01/2016   Severe OSA with AHI 38/hr now on BiPAP at 21/17cm H2O  . Syncopal episodes    relative hypotension    Past Surgical History:  Procedure Laterality Date  . BACK SURGERY    . CARDIAC CATHETERIZATION  1980s-1990s X 4  . CARDIAC CATHETERIZATION N/A 03/26/2016   Procedure: Left Heart Cath and Cors/Grafts Angiography;  Surgeon: Burnell Blanks, MD;  Location: [facility];  Service: Cardiovascular;  Laterality: N/A;  . CARDIAC CATHETERIZATION N/A 03/26/2016   Procedure: Coronary Stent Intervention;  Surgeon: Burnell Blanks, MD;  Location: [facility];  Service: Cardiovascular;  Laterality: N/A;  . CARDIAC DEFIBRILLATOR PLACEMENT  2008  . CATARACT EXTRACTION W/ INTRAOCULAR LENS  IMPLANT, BILATERAL Bilateral 08/2013 - 09/2013   right - left   .  CORONARY ANGIOPLASTY WITH STENT PLACEMENT  ~ 1992; 03/26/2016   "1 + 2"  . CORONARY ARTERY BYPASS GRAFT  1992   CABG "X4"  . CYSTOSCOPY  1980s  . GAS/FLUID EXCHANGE Right 06/18/2017   Procedure: GAS/FLUID EXCHANGE (C3F8);  Surgeon: Sherlynn Stalls, MD;  Location: [facility];  Service: Ophthalmology;  Laterality: Right;  . IMPLANTABLE CARDIOVERTER DEFIBRILLATOR (ICD) GENERATOR CHANGE N/A 06/21/2014  Procedure: ICD GENERATOR CHANGE;  Surgeon: Deboraha Sprang, MD;  Location: [facility];  Service: Cardiovascular;  Laterality: N/A;  . PARS PLANA VITRECTOMY Right 06/18/2017   Procedure: PARS PLANA VITRECTOMY WITH 25 GAUGE;  Surgeon: Sherlynn Stalls, MD;  Location: [facility];  Service: Ophthalmology;  Laterality: Right;  . PHOTOCOAGULATION WITH LASER Right 06/18/2017   Procedure: PHOTOCOAGULATION WITH LASER- ENDO LASER;  Surgeon: Sherlynn Stalls, MD;  Location: [facility];  Service: Ophthalmology;  Laterality: Right;  . POSTERIOR LUMBAR FUSION  ~ 1989   "fused 2 discs"  . TIBIA FRACTURE SURGERY Left 1943   "put artificial bone in there"    Patient Active Problem List   Diagnosis Date Noted  . Chronic systolic CHF (congestive heart failure) ([place]) 11/18/2013    Priority: High  . CAD (coronary artery disease)     Priority: High  . Cat bite 07/20/2019  . DOE (dyspnea on exertion) 05/31/2019  . Edema 05/31/2019  . Varicose veins of both lower extremities without ulcer or inflammation 02/02/2018  . Allergic rhinitis 12/29/2017  . HLD (hyperlipidemia) 11/09/2017  . Left lateral epicondylitis 07/02/2017  . Knee pain, right 12/24/2016  . OSA (obstructive sleep apnea) 09/01/2016  . Status post coronary artery stent placement   . Hypertensive heart disease with heart failure ([place])   . Unstable angina ([place])   . Thumb pain 05/04/2015  . Ejection fraction   . Well adult exam 07/13/2012  . Chronic renal insufficiency, stage III (moderate) ([place]) 07/21/2011  . Ischemic cardiomyopathy   . Hx of CABG   . Chronotropic  incompetence   . Syncopal episodes   . Mitral regurgitation   . Numbness and tingling of left arm and leg   . Arm pain, diffuse, left 10/22/2009  . PARESTHESIA 10/22/2009  . TOBACCO USE, QUIT 10/22/2009  . SEBACEOUS CYST 08/27/2009  . Cough 02/28/2009  . Anemia, chronic disease 02/18/2008  . Dyslipidemia 05/03/2007  . Hypertensive heart disease 05/03/2007  . COLONIC POLYPS, HX OF 05/03/2007  . Automatic implantable cardioverter-defibrillator in situ 03/16/2007      Current Outpatient Medications  Medication Sig Dispense Refill  . atorvastatin (LIPITOR) 40 MG tablet TAKE 1 TABLET DAILY 90 tablet 3  . carvedilol (COREG) 12.5 MG tablet TAKE 1 TABLET TWICE A DAY 180 tablet 3  . Cholecalciferol 1000 UNITS tablet Take 1,000 Units by mouth daily.      . clopidogrel (PLAVIX) 75 MG tablet TAKE 1 TABLET DAILY 90 tablet 3  . furosemide (LASIX) 40 MG tablet Take 1.5 tablets (60 mg total) by mouth daily. 45 tablet 11  . irbesartan (AVAPRO) 150 MG tablet Take 0.5 tablets (75 mg total) by mouth daily. Take 1/2 of your 150 mg tablets daily 45 tablet 3  . isosorbide mononitrate (IMDUR) 120 MG 24 hr tablet Take 1 tablet (120 mg total) by mouth daily. 90 tablet 3  . Multiple Vitamins-Minerals (PRESERVISION AREDS 2) CAPS Take 1 capsule by mouth daily.     . nitroGLYCERIN (NITROLINGUAL) 0.4 MG/SPRAY spray Place 1 spray under the tongue every 5 (five) minutes x 3 doses as needed for chest pain. 12 g 3  . potassium chloride (KLOR-CON) 10 MEQ tablet Take 1 tablet (10 mEq total) by mouth daily. 90 tablet 3  . ranolazine (RANEXA) 1000 MG SR tablet Take 1 tablet (1,000 mg total) by mouth 2 (two) times daily. 180 tablet 3   No current facility-administered medications for this visit.    Allergies:   Morphine and Ramipril  Social History:  The patient  reports that he has quit smoking. His smoking use included cigars. He quit after 29.00 years of use. He has never used smokeless tobacco. He reports  previous alcohol use. He reports that he does not use drugs.   Family History:  The patient's family history includes Colon cancer in an other family member; Hypertension in his father, mother, and another family member.    ROS: Noted in current history, otherwise review of systems is negative.   Physical Exam: There were no vitals taken for this visit.  GEN:  Elderly male, NAD  HEENT: Normal NECK: No JVD; No carotid bruits LYMPHATICS: No lymphadenopathy CARDIAC: RRR ,  Soft systolic murmur  RESPIRATORY:  Clear to auscultation without rales, wheezing or rhonchi  ABDOMEN: Soft, non-tender, non-distended MUSCULOSKELETAL:  No edema; No deformity  SKIN: Warm and dry NEUROLOGIC:  Alert and oriented x 3   EKG:      Recent Labs: 05/25/2019: ALT 26; Hemoglobin 11.3; Platelets 229 05/31/2019: BUN 37; Creatinine, Ser 1.31; NT-Pro BNP 1,027; Potassium 5.0; Sodium 136    Lipid Panel    Component Value Date/Time   CHOL 90 (L) 06/28/2019 1057   TRIG 119 06/28/2019 1057   HDL 36 (L) 06/28/2019 1057   CHOLHDL 2.5 06/28/2019 1057   CHOLHDL 4 12/24/2016 1103   VLDL 41.8 (H) 12/24/2016 1103   LDLCALC 32 06/28/2019 1057   LDLDIRECT 35.0 12/24/2016 1103      Wt Readings from Last 3 Encounters:  10/19/19 158 lb (71.7 kg)  08/24/19 159 lb (72.1 kg)  08/10/19 157 lb 1.9 oz (71.3 kg)      Current medicines are reviewed  The patient understands his medications.   ASSESSMENT AND PLAN:  1. Coronary artery disease-status post coronary artery bypass grafting he has only 1 of 3 grafts are patent-his LIMA to LAD.   Cont Ranexa and Imdur.  Not having significant angian   2. Hyperlipidemia -     Labs were checked today    3. Chronic systolic congestive heart failure -his ejection fraction is 30 to 35%.    Doing well.   Avoids salty foods.   No significant dyspnea  4. ICD -  Managed by EP    5. Obstructive sleep apnea -   Mertie Moores, MD  12/27/2019 9:54 AM    [facility] [place],  [place], [place]  [address] Pager [phone] Phone: [phone]; Fax: [phone]

## 2020-01-05 ENCOUNTER — Encounter: Payer: Self-pay | Admitting: Internal Medicine

## 2020-01-05 ENCOUNTER — Ambulatory Visit (INDEPENDENT_AMBULATORY_CARE_PROVIDER_SITE_OTHER): Payer: Medicare Other | Admitting: Internal Medicine

## 2020-01-05 ENCOUNTER — Other Ambulatory Visit: Payer: Self-pay

## 2020-01-05 DIAGNOSIS — I5022 Chronic systolic (congestive) heart failure: Secondary | ICD-10-CM

## 2020-01-05 DIAGNOSIS — I2510 Atherosclerotic heart disease of native coronary artery without angina pectoris: Secondary | ICD-10-CM

## 2020-01-05 DIAGNOSIS — F19982 Other psychoactive substance use, unspecified with psychoactive substance-induced sleep disorder: Secondary | ICD-10-CM | POA: Diagnosis not present

## 2020-01-05 DIAGNOSIS — N1832 Chronic kidney disease, stage 3b: Secondary | ICD-10-CM | POA: Diagnosis not present

## 2020-01-05 DIAGNOSIS — G4700 Insomnia, unspecified: Secondary | ICD-10-CM | POA: Insufficient documentation

## 2020-01-05 MED ORDER — TRAZODONE HCL 50 MG PO TABS: 25.0000 mg | ORAL_TABLET | Freq: Every evening | ORAL | 5 refills | Status: AC | PRN

## 2020-01-05 NOTE — Progress Notes (Signed)
Subjective:  Patient ID: [name] [name]., male    DOB: Jan 28, 1929  Age: 86 y.o. MRN: [id number]  CC: No chief complaint on file.   HPI [name] [name]. presents for insomnia on Ranexa bid (was ok on a lesser dose) F/u CAD, CHF  Outpatient Medications Prior to Visit  Medication Sig Dispense Refill  . atorvastatin (LIPITOR) 40 MG tablet TAKE 1 TABLET DAILY 90 tablet 3  . carvedilol (COREG) 12.5 MG tablet TAKE 1 TABLET TWICE A DAY 180 tablet 3  . Cholecalciferol 1000 UNITS tablet Take 1,000 Units by mouth daily.      . clopidogrel (PLAVIX) 75 MG tablet TAKE 1 TABLET DAILY 90 tablet 3  . furosemide (LASIX) 40 MG tablet Take 1.5 tablets (60 mg total) by mouth daily. 45 tablet 11  . irbesartan (AVAPRO) 150 MG tablet Take 0.5 tablets (75 mg total) by mouth daily. Take 1/2 of your 150 mg tablets daily 45 tablet 3  . isosorbide mononitrate (IMDUR) 120 MG 24 hr tablet Take 1 tablet (120 mg total) by mouth daily. 90 tablet 3  . Multiple Vitamins-Minerals (PRESERVISION AREDS 2) CAPS Take 1 capsule by mouth daily.     . nitroGLYCERIN (NITROLINGUAL) 0.4 MG/SPRAY spray Place 1 spray under the tongue every 5 (five) minutes x 3 doses as needed for chest pain. 12 g 3  . potassium chloride (KLOR-CON) 10 MEQ tablet Take 1 tablet (10 mEq total) by mouth daily. 90 tablet 3  . ranolazine (RANEXA) 1000 MG SR tablet Take 1 tablet (1,000 mg total) by mouth 2 (two) times daily. 180 tablet 3   No facility-administered medications prior to visit.    ROS: Review of Systems  Constitutional: Negative for appetite change, fatigue and unexpected weight change.  HENT: Negative for congestion, nosebleeds, sneezing, sore throat and trouble swallowing.   Eyes: Negative for itching and visual disturbance.  Respiratory: Negative for cough.   Cardiovascular: Negative for chest pain, palpitations and leg swelling.  Gastrointestinal: Negative for abdominal distention, blood in stool, diarrhea and nausea.  Genitourinary:  Negative for frequency and hematuria.  Musculoskeletal: Positive for back pain. Negative for gait problem, joint swelling and neck pain.  Skin: Negative for rash.  Neurological: Negative for dizziness, tremors, speech difficulty and weakness.  Psychiatric/Behavioral: Positive for sleep disturbance. Negative for agitation and dysphoric mood. The patient is not nervous/anxious.     Objective:  BP 124/78 (BP Location: Left Arm, Patient Position: Sitting, Cuff Size: Normal)   Pulse 78   Temp 98 F (36.7 C) (Oral)   Ht 5\' 6"  (1.676 m)   Wt 155 lb (70.3 kg)   SpO2 96%   BMI 25.02 kg/m   BP Readings from Last 3 Encounters:  01/05/20 124/78  12/27/19 110/60  10/19/19 (!) 110/58    Wt Readings from Last 3 Encounters:  01/05/20 155 lb (70.3 kg)  12/27/19 156 lb 12.8 oz (71.1 kg)  10/19/19 158 lb (71.7 kg)    Physical Exam Constitutional:      General: He is not in acute distress.    Appearance: He is well-developed.     Comments: NAD  Eyes:     Conjunctiva/sclera: Conjunctivae normal.     Pupils: Pupils are equal, round, and reactive to light.  Neck:     Thyroid: No thyromegaly.     Vascular: No JVD.  Cardiovascular:     Rate and Rhythm: Normal rate and regular rhythm.     Heart sounds: Normal heart sounds. No  murmur. No friction rub. No gallop.   Pulmonary:     Effort: Pulmonary effort is normal. No respiratory distress.     Breath sounds: Normal breath sounds. No wheezing or rales.  Chest:     Chest wall: No tenderness.  Abdominal:     General: Bowel sounds are normal. There is no distension.     Palpations: Abdomen is soft. There is no mass.     Tenderness: There is no abdominal tenderness. There is no guarding or rebound.  Musculoskeletal:        General: No tenderness. Normal range of motion.     Cervical back: Normal range of motion.     Right lower leg: Edema present.     Left lower leg: Edema present.  Lymphadenopathy:     Cervical: No cervical adenopathy.    Skin:    General: Skin is warm and dry.     Findings: No rash.  Neurological:     Mental Status: He is alert and oriented to person, place, and time.     Cranial Nerves: No cranial nerve deficit.     Motor: No abnormal muscle tone.     Coordination: Coordination normal.     Gait: Gait normal.     Deep Tendon Reflexes: Reflexes are normal and symmetric.  Psychiatric:        Behavior: Behavior normal.        Thought Content: Thought content normal.        Judgment: Judgment normal.   mild edema  Lab Results  Component Value Date   WBC 7.1 05/25/2019   HGB 11.3 (L) 05/25/2019   HCT 33.2 (L) 05/25/2019   PLT 229 05/25/2019   GLUCOSE 128 (H) 12/27/2019   CHOL 88 (L) 12/27/2019   TRIG 117 12/27/2019   HDL 35 (L) 12/27/2019   LDLDIRECT 35.0 12/24/2016   LDLCALC 32 12/27/2019   ALT 13 12/27/2019   AST 13 12/27/2019   NA 139 12/27/2019   K 4.6 12/27/2019   CL 102 12/27/2019   CREATININE 1.47 (H) 12/27/2019   BUN 31 (H) 12/27/2019   CO2 25 12/27/2019   TSH 3.71 12/24/2016   PSA 3.11 12/24/2016   INR 1.0 03/25/2016   HGBA1C 6.4 12/24/2016    DG Hand Complete Left  Result Date: 07/16/2019 CLINICAL DATA:  Animal bite. EXAM: LEFT HAND - COMPLETE 3+ VIEW COMPARISON:  None. FINDINGS: There is soft tissue swelling about the hand, most notably the fourth digit. There is no radiopaque foreign body. No evidence for displaced fracture or dislocation. There is no radiographic evidence for osteomyelitis. There are advanced degenerative changes at the first carpometacarpal joint. Vascular calcifications are noted. There are degenerative changes of the interphalangeal joints. IMPRESSION: 1. No acute displaced fracture or dislocation. 2. Nonspecific soft tissue swelling about the hand. 3. Degenerative changes as above. Electronically Signed   By: Constance Holster M.D.   On: 07/16/2019 20:32    Assessment & Plan:   There are no diagnoses linked to this encounter.   No orders of the  defined types were placed in this encounter.    Follow-up: No follow-ups on file.  Walker Kehr, MD

## 2020-01-05 NOTE — Assessment & Plan Note (Signed)
Will monitor labs.

## 2020-01-05 NOTE — Assessment & Plan Note (Signed)
Isosorbide, Losartan, Coreg, Lasix

## 2020-01-05 NOTE — Assessment & Plan Note (Signed)
Better on Ranexa

## 2020-01-05 NOTE — Assessment & Plan Note (Signed)
insomnia on Ranexa bid (was ok on a lesser dose) Try Valerian root, Melatonin for insomnia If not better - use trazodone 1/2 or 1 at bedtime

## 2020-01-05 NOTE — Patient Instructions (Signed)
Try Valerian root, Melatonin for insomnia  If not better - use trazodone 1/2 or 1 at bedtime

## 2020-01-30 ENCOUNTER — Ambulatory Visit (INDEPENDENT_AMBULATORY_CARE_PROVIDER_SITE_OTHER): Payer: Medicare Other | Admitting: *Deleted

## 2020-01-30 DIAGNOSIS — I110 Hypertensive heart disease with heart failure: Secondary | ICD-10-CM

## 2020-01-30 DIAGNOSIS — I255 Ischemic cardiomyopathy: Secondary | ICD-10-CM

## 2020-01-30 LAB — CUP PACEART REMOTE DEVICE CHECK
Battery Remaining Longevity: 34 mo
Battery Remaining Percentage: 41 %
Battery Voltage: 2.89 V
Brady Statistic AP VP Percent: 7.7 %
Brady Statistic AP VS Percent: 90 %
Brady Statistic AS VP Percent: 1 %
Brady Statistic AS VS Percent: 1.8 %
Brady Statistic RA Percent Paced: 97 %
Brady Statistic RV Percent Paced: 7.7 %
Date Time Interrogation Session: 20210517020018
HighPow Impedance: 63 Ohm
HighPow Impedance: 63 Ohm
Implantable Lead Implant Date: 20080711
Implantable Lead Implant Date: 20080711
Implantable Lead Location: 753859
Implantable Lead Location: 753860
Implantable Lead Model: 7122
Implantable Pulse Generator Implant Date: 20151007
Lead Channel Impedance Value: 350 Ohm
Lead Channel Impedance Value: 380 Ohm
Lead Channel Pacing Threshold Amplitude: 0.5 V
Lead Channel Pacing Threshold Amplitude: 0.75 V
Lead Channel Pacing Threshold Pulse Width: 0.5 ms
Lead Channel Pacing Threshold Pulse Width: 0.5 ms
Lead Channel Sensing Intrinsic Amplitude: 1.9 mV
Lead Channel Sensing Intrinsic Amplitude: 11.6 mV
Lead Channel Setting Pacing Amplitude: 1.75 V
Lead Channel Setting Pacing Amplitude: 2.5 V
Lead Channel Setting Pacing Pulse Width: 0.5 ms
Lead Channel Setting Sensing Sensitivity: 0.5 mV
Pulse Gen Serial Number: 7225183

## 2020-01-31 NOTE — Progress Notes (Signed)
Remote ICD transmission.   

## 2020-02-21 DIAGNOSIS — G4733 Obstructive sleep apnea (adult) (pediatric): Secondary | ICD-10-CM | POA: Diagnosis not present

## 2020-04-05 ENCOUNTER — Other Ambulatory Visit: Payer: Self-pay

## 2020-04-05 ENCOUNTER — Encounter: Payer: Self-pay | Admitting: Internal Medicine

## 2020-04-05 ENCOUNTER — Ambulatory Visit (INDEPENDENT_AMBULATORY_CARE_PROVIDER_SITE_OTHER): Payer: Medicare Other | Admitting: Internal Medicine

## 2020-04-05 DIAGNOSIS — E785 Hyperlipidemia, unspecified: Secondary | ICD-10-CM

## 2020-04-05 DIAGNOSIS — I110 Hypertensive heart disease with heart failure: Secondary | ICD-10-CM | POA: Diagnosis not present

## 2020-04-05 DIAGNOSIS — I255 Ischemic cardiomyopathy: Secondary | ICD-10-CM

## 2020-04-05 DIAGNOSIS — I2510 Atherosclerotic heart disease of native coronary artery without angina pectoris: Secondary | ICD-10-CM

## 2020-04-05 DIAGNOSIS — I5022 Chronic systolic (congestive) heart failure: Secondary | ICD-10-CM

## 2020-04-05 DIAGNOSIS — I5089 Other heart failure: Secondary | ICD-10-CM | POA: Diagnosis not present

## 2020-04-05 NOTE — Assessment & Plan Note (Addendum)
Lipitor, Plavix Isosorbide, Irbesartan, Coreg Cont w/ Ranexa

## 2020-04-05 NOTE — Progress Notes (Signed)
Subjective:  Patient ID: [name] [name]., male    DOB: Apr 02, 1929  Age: 86 y.o. MRN: [id number]  CC: No chief complaint on file.   HPI [name] [name]. presents for CAD, HTN, dyslipidemia f/u Wife is at the hospital - CABG x3  Outpatient Medications Prior to Visit  Medication Sig Dispense Refill  . atorvastatin (LIPITOR) 40 MG tablet TAKE 1 TABLET DAILY 90 tablet 3  . carvedilol (COREG) 12.5 MG tablet TAKE 1 TABLET TWICE A DAY 180 tablet 3  . Cholecalciferol 1000 UNITS tablet Take 1,000 Units by mouth daily.      . clopidogrel (PLAVIX) 75 MG tablet TAKE 1 TABLET DAILY 90 tablet 3  . furosemide (LASIX) 40 MG tablet Take 1.5 tablets (60 mg total) by mouth daily. 45 tablet 11  . irbesartan (AVAPRO) 150 MG tablet Take 0.5 tablets (75 mg total) by mouth daily. Take 1/2 of your 150 mg tablets daily 45 tablet 3  . isosorbide mononitrate (IMDUR) 120 MG 24 hr tablet Take 1 tablet (120 mg total) by mouth daily. 90 tablet 3  . Multiple Vitamins-Minerals (PRESERVISION AREDS 2) CAPS Take 1 capsule by mouth daily.     . nitroGLYCERIN (NITROLINGUAL) 0.4 MG/SPRAY spray Place 1 spray under the tongue every 5 (five) minutes x 3 doses as needed for chest pain. 12 g 3  . potassium chloride (KLOR-CON) 10 MEQ tablet Take 1 tablet (10 mEq total) by mouth daily. 90 tablet 3  . ranolazine (RANEXA) 1000 MG SR tablet Take 1 tablet (1,000 mg total) by mouth 2 (two) times daily. 180 tablet 3  . traZODone (DESYREL) 50 MG tablet Take 0.5-1 tablets (25-50 mg total) by mouth at bedtime as needed for sleep. 30 tablet 5   No facility-administered medications prior to visit.    ROS: Review of Systems  Constitutional: Negative for appetite change, fatigue and unexpected weight change.  HENT: Negative for congestion, nosebleeds, sneezing, sore throat and trouble swallowing.   Eyes: Negative for itching and visual disturbance.  Respiratory: Negative for cough.   Cardiovascular: Negative for chest pain, palpitations  and leg swelling.  Gastrointestinal: Negative for abdominal distention, blood in stool, diarrhea and nausea.  Genitourinary: Negative for frequency and hematuria.  Musculoskeletal: Negative for back pain, gait problem, joint swelling and neck pain.  Skin: Negative for rash.  Neurological: Negative for dizziness, tremors, speech difficulty and weakness.  Psychiatric/Behavioral: Negative for agitation, dysphoric mood and sleep disturbance. The patient is not nervous/anxious.     Objective:  BP (!) 108/60 (BP Location: Right Arm, Patient Position: Sitting, Cuff Size: Normal)   Pulse 81   Temp 98.1 F (36.7 C) (Oral)   Ht 5\' 6"  (1.676 m)   Wt 149 lb (67.6 kg)   SpO2 96%   BMI 24.05 kg/m   BP Readings from Last 3 Encounters:  04/05/20 (!) 108/60  01/05/20 124/78  12/27/19 110/60    Wt Readings from Last 3 Encounters:  04/05/20 149 lb (67.6 kg)  01/05/20 155 lb (70.3 kg)  12/27/19 156 lb 12.8 oz (71.1 kg)    Physical Exam Constitutional:      General: He is not in acute distress.    Appearance: He is well-developed.     Comments: NAD  Eyes:     Conjunctiva/sclera: Conjunctivae normal.     Pupils: Pupils are equal, round, and reactive to light.  Neck:     Thyroid: No thyromegaly.     Vascular: No JVD.  Cardiovascular:  Rate and Rhythm: Normal rate and regular rhythm.     Heart sounds: Normal heart sounds. No murmur heard.  No friction rub. No gallop.   Pulmonary:     Effort: Pulmonary effort is normal. No respiratory distress.     Breath sounds: Normal breath sounds. No wheezing or rales.  Chest:     Chest wall: No tenderness.  Abdominal:     General: Bowel sounds are normal. There is no distension.     Palpations: Abdomen is soft. There is no mass.     Tenderness: There is no abdominal tenderness. There is no guarding or rebound.  Musculoskeletal:        General: No tenderness. Normal range of motion.     Cervical back: Normal range of motion.  Lymphadenopathy:      Cervical: No cervical adenopathy.  Skin:    General: Skin is warm and dry.     Findings: No rash.  Neurological:     Mental Status: He is alert and oriented to person, place, and time.     Cranial Nerves: No cranial nerve deficit.     Motor: No abnormal muscle tone.     Coordination: Coordination normal.     Gait: Gait normal.     Deep Tendon Reflexes: Reflexes are normal and symmetric.  Psychiatric:        Behavior: Behavior normal.        Thought Content: Thought content normal.        Judgment: Judgment normal.     Lab Results  Component Value Date   WBC 7.1 05/25/2019   HGB 11.3 (L) 05/25/2019   HCT 33.2 (L) 05/25/2019   PLT 229 05/25/2019   GLUCOSE 128 (H) 12/27/2019   CHOL 88 (L) 12/27/2019   TRIG 117 12/27/2019   HDL 35 (L) 12/27/2019   LDLDIRECT 35.0 12/24/2016   LDLCALC 32 12/27/2019   ALT 13 12/27/2019   AST 13 12/27/2019   NA 139 12/27/2019   K 4.6 12/27/2019   CL 102 12/27/2019   CREATININE 1.47 (H) 12/27/2019   BUN 31 (H) 12/27/2019   CO2 25 12/27/2019   TSH 3.71 12/24/2016   PSA 3.11 12/24/2016   INR 1.0 03/25/2016   HGBA1C 6.4 12/24/2016    DG Hand Complete Left  Result Date: 07/16/2019 CLINICAL DATA:  Animal bite. EXAM: LEFT HAND - COMPLETE 3+ VIEW COMPARISON:  None. FINDINGS: There is soft tissue swelling about the hand, most notably the fourth digit. There is no radiopaque foreign body. No evidence for displaced fracture or dislocation. There is no radiographic evidence for osteomyelitis. There are advanced degenerative changes at the first carpometacarpal joint. Vascular calcifications are noted. There are degenerative changes of the interphalangeal joints. IMPRESSION: 1. No acute displaced fracture or dislocation. 2. Nonspecific soft tissue swelling about the hand. 3. Degenerative changes as above. Electronically Signed   By: Constance Holster M.D.   On: 07/16/2019 20:32    Assessment & Plan:   There are no diagnoses linked to this  encounter.   No orders of the defined types were placed in this encounter.    Follow-up: No follow-ups on file.  Walker Kehr, MD

## 2020-04-05 NOTE — Assessment & Plan Note (Signed)
On Lipitor 

## 2020-04-05 NOTE — Addendum Note (Signed)
Addended by: Cresenciano Lick on: 04/05/2020 01:38 PM   Modules accepted: Orders

## 2020-04-05 NOTE — Assessment & Plan Note (Signed)
Lipitor, Plavix Isosorbide, Irbesartan, Coreg, Lasix

## 2020-04-06 LAB — BASIC METABOLIC PANEL WITH GFR
BUN/Creatinine Ratio: 25 (calc) — ABNORMAL HIGH (ref 6–22)
BUN: 47 mg/dL — ABNORMAL HIGH (ref 7–25)
CO2: 27 mmol/L (ref 20–32)
Calcium: 9.1 mg/dL (ref 8.6–10.3)
Chloride: 100 mmol/L (ref 98–110)
Creat: 1.85 mg/dL — ABNORMAL HIGH (ref 0.70–1.11)
GFR, Est African American: 37 mL/min/{1.73_m2} — ABNORMAL LOW (ref >=60)
GFR, Est Non African American: 32 mL/min/{1.73_m2} — ABNORMAL LOW (ref >=60)
Glucose, Bld: 155 mg/dL — ABNORMAL HIGH (ref 65–99)
Potassium: 4.5 mmol/L (ref 3.5–5.3)
Sodium: 136 mmol/L (ref 135–146)

## 2020-04-06 LAB — SPECIMEN COMPROMISED

## 2020-04-30 ENCOUNTER — Ambulatory Visit (INDEPENDENT_AMBULATORY_CARE_PROVIDER_SITE_OTHER): Payer: Medicare Other | Admitting: *Deleted

## 2020-04-30 DIAGNOSIS — I255 Ischemic cardiomyopathy: Secondary | ICD-10-CM | POA: Diagnosis not present

## 2020-04-30 LAB — CUP PACEART REMOTE DEVICE CHECK
Battery Remaining Longevity: 30 mo
Battery Remaining Percentage: 38 %
Battery Voltage: 2.87 V
Brady Statistic AP VP Percent: 17 %
Brady Statistic AP VS Percent: 81 %
Brady Statistic AS VP Percent: 1 %
Brady Statistic AS VS Percent: 1.4 %
Brady Statistic RA Percent Paced: 98 %
Brady Statistic RV Percent Paced: 17 %
Date Time Interrogation Session: 20210816020016
HighPow Impedance: 64 Ohm
HighPow Impedance: 64 Ohm
Implantable Lead Implant Date: 20080711
Implantable Lead Implant Date: 20080711
Implantable Lead Location: 753859
Implantable Lead Location: 753860
Implantable Lead Model: 7122
Implantable Pulse Generator Implant Date: 20151007
Lead Channel Impedance Value: 350 Ohm
Lead Channel Impedance Value: 390 Ohm
Lead Channel Pacing Threshold Amplitude: 0.5 V
Lead Channel Pacing Threshold Amplitude: 0.75 V
Lead Channel Pacing Threshold Pulse Width: 0.5 ms
Lead Channel Pacing Threshold Pulse Width: 0.5 ms
Lead Channel Sensing Intrinsic Amplitude: 2.1 mV
Lead Channel Sensing Intrinsic Amplitude: 9.5 mV
Lead Channel Setting Pacing Amplitude: 1.75 V
Lead Channel Setting Pacing Amplitude: 2.5 V
Lead Channel Setting Pacing Pulse Width: 0.5 ms
Lead Channel Setting Sensing Sensitivity: 0.5 mV
Pulse Gen Serial Number: 7225183

## 2020-05-01 ENCOUNTER — Telehealth: Payer: Self-pay

## 2020-05-01 NOTE — Telephone Encounter (Signed)
[name] [name] scheduled remote 04/30/20 received. Thoracic impedance below baseline >2 weeks. Called patient to assess. No answer. LMOVM.

## 2020-05-01 NOTE — Telephone Encounter (Signed)
Pt returned phone.  Advised pt the report shows possible of fluid retention.    Pt reports he has had increased SOB on exertion lately.  He denies any new onset cough or other cardiac symptoms, also denies any swelling in extremities.  Pt reports he has had to be more active lately due to caring for his spouse as she recovers from recent surgery.    Pt confirmed that he is taking Lasix 60mg  daily as directed.    Pt has never been instructed to take extra lasix nor is there an order for PRN dosing.    Advised pt I would forward to his primary cardiologist, Dr. Acie Fredrickson for further instruction.

## 2020-05-01 NOTE — Telephone Encounter (Signed)
Spoke with pt and made him aware of recommendations.  Pt verbalized understanding and was in agreement with this plan.

## 2020-05-01 NOTE — Telephone Encounter (Signed)
Please remind him to limit his salt intake.   His creatinine in July , 2021 was mildly elevated.   We will need to be careful if we give him additional lasix but I would agree to have him take Lasix 60 mg BID for 2-3 days if he is short of breath at rest or exertion He has an appt with Richardson Dopp, PA in several weeks and will be evaluated further at that time .

## 2020-05-01 NOTE — Telephone Encounter (Signed)
The pt returning the nurse phone call. I let him speak with the device nurse Amy, rn.

## 2020-05-02 NOTE — Progress Notes (Signed)
Remote ICD transmission.   

## 2020-05-14 ENCOUNTER — Other Ambulatory Visit: Payer: Self-pay | Admitting: Cardiovascular Disease

## 2020-05-21 NOTE — Progress Notes (Signed)
Cardiology Office Note:    Date:  05/22/2020   ID:  [name] Curia., DOB 10-Jun-1929, MRN [id number]  PCP:  Cassandria Anger, MD  [facility] Cardiologist:  Mertie Moores, MD   [facility] Electrophysiologist:  Virl Axe, MD   Referring MD: Cassandria Anger, MD   Chief Complaint:  Follow-up (CHF, CAD)    Patient Profile:    [name] Ishida. is a 86 y.o. male with:   Coronary artery disease  ? S/p CABG in 1992 ? [place] 7/17: L-LAD ok, S-Lat ramus+S-RCA100, severe ostial RI dz >> PCI:  DES x 2 to RI  Chronic systolic CHF ? Ischemic CM  ? Echocardiogram 02/2019: EF 30-35, Gr 2 DD, moderate MR ? Echocardiogram 7/18: EF 30-35, Gr 2 DD  Mild regurgitation  Chronotropic incompetence s/p pacer/ICD  Chronic kidney disease   OSA (Dr. Radford Pax)   Hx of syncope  NSVT  Hyperlipidemia   Prior CV studies: Echocardiogram 03/15/2019 EF 30-35, mild LVH, Gr 2 DD, normal RVSF, mod LAE, mild MAC, mod MR, trivial AI  Echocardiogram 03/30/2017  Mod to severe LVH, EF 30-35, Gr 2 DD, trivial AI, mild to mod MR, mild to mod reduced RVSF, mild increased PASP  LHC 03/26/16 LAD mid 100% CTO RI ostial 99% LCx okay RCA proximal 100% LIMA-LAD okay SVG-lateral ramus 100% SVG-distal RCA 100% CTO EF 35-45% PCI:2.25 x 28 and 2.25 x 16 mm Promus Premier DES to the RI  Echo 9/14 Mild LVH, mild focal basal septal hypertrophy, EF 35-40%, anteroseptal HK, normal diastolic function, mild MR, mild LAE, PASP 34 mmHg   History of Present Illness:    [name] [name] was last seen by Dr. Acie Fredrickson in 4/21. His device interrogation demonstrated decreased thoracic impedance 2 weeks ago and his Furosemide was adjusted.  He returns for follow up. He is here alone. His wife recently had valve surgery and bypass. He has been helping take care of her at home as well as feeding his horses and chickens. Over the past month, he has noted chest pressure when he gets back up to the house from  feeding animals. He sometimes has to sit for 10 to 15 minutes prior to the pain subsiding. He has not taken nitroglycerin. He does note shortness of breath associated with this. He has no radiating symptoms. He has not had syncope, orthopnea or significant leg swelling.      Past Medical History:  Diagnosis Date  . AICD (automatic cardioverter/defibrillator) present   . Arrhythmia   . Arthritis    "hands, legs" (03/26/2016)  . 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 Chronic systolic CHF (congestive heart failure) ([place])    a. Echo 9/14: Mild LVH, mild focal basal septal hypertrophy, EF 35-40%, anteroseptal HK, normal diastolic function, mild MR, mild LAE, PASP 34 mmHg  . Chronotropic incompetence    treated w/pacemaker, ICD 7.2008. This was placed after CPX done post 2008 cath. CPX showed only chronotropic incompetence  . History of blood transfusion 1969   "after getting MSO4; it destroys my corpuscles" (03/26/2016)  . History of colon polyps   . Hyperlipidemia   . Hypertension   .  Migraine    "just before my bypass" (03/26/2016)  . Mitral regurgitation    mild  . Myocardial infarction ([place]) "several"  . Numbness and tingling of left arm and leg    improved after tx w/prednisone  . OSA (obstructive sleep apnea) 09/01/2016   Severe OSA with AHI 38/hr now on BiPAP at 21/17cm H2O  . Syncopal episodes    relative hypotension    Current Medications: Current Meds  Medication Sig  . atorvastatin (LIPITOR) 40 MG tablet TAKE 1 TABLET DAILY  . carvedilol (COREG) 12.5 MG tablet TAKE 1 TABLET TWICE A DAY  . Cholecalciferol 1000 UNITS tablet Take 1,000 Units by mouth daily.    . clopidogrel (PLAVIX) 75 MG tablet  TAKE 1 TABLET DAILY  . furosemide (LASIX) 40 MG tablet Take 1.5 tablets (60 mg total) by mouth daily.  . isosorbide mononitrate (IMDUR) 120 MG 24 hr tablet TAKE 1 TABLET DAILY  . Multiple Vitamins-Minerals (PRESERVISION AREDS 2) CAPS Take 1 capsule by mouth daily.   . nitroGLYCERIN (NITROLINGUAL) 0.4 MG/SPRAY spray Place 1 spray under the tongue every 5 (five) minutes x 3 doses as needed for chest pain.  . potassium chloride (KLOR-CON) 10 MEQ tablet Take 1 tablet (10 mEq total) by mouth daily.  . ranolazine (RANEXA) 1000 MG SR tablet Take 1 tablet (1,000 mg total) by mouth 2 (two) times daily.  . traZODone (DESYREL) 50 MG tablet Take 0.5-1 tablets (25-50 mg total) by mouth at bedtime as needed for sleep.  . [DISCONTINUED] irbesartan (AVAPRO) 150 MG tablet Take 75 mg by mouth daily.     Allergies:   Morphine and Ramipril   Social History   Tobacco Use  . Smoking status: Former Smoker    Years: 29.00    Types: Cigars  . Smokeless tobacco: Never Used  . Tobacco comment: quit smoking cigars in 1973  Vaping Use  . Vaping Use: Never used  Substance Use Topics  . Alcohol use: Not Currently    Comment: "quit drinking in 1973"  . Drug use: No     Family Hx: The patient's family history includes Colon cancer in an other family member; Hypertension in his father, mother, and another family member.  Review of Systems  Constitutional: Negative for fever.  Respiratory: Positive for cough (non-productive).   Gastrointestinal: Negative for diarrhea, hematochezia, melena and vomiting.     EKGs/Labs/Other Test Reviewed:    EKG:  EKG is   ordered today.  The ekg ordered today demonstrates atrial paced, HR 70, normal axis, incomplete right bundle branch block, first-degree block, PR interval 276, QTC 457, inferolateral T wave inversions, PVC  Recent Labs: 05/25/2019: Hemoglobin 11.3; Platelets 229 05/31/2019: NT-Pro BNP 1,027 12/27/2019: ALT 13 04/05/2020: BUN 47; Creat 1.85; Potassium 4.5;  Sodium 136   Recent Lipid Panel Lab Results  Component Value Date/Time   CHOL 88 (L) 12/27/2019 10:29 AM   TRIG 117 12/27/2019 10:29 AM   HDL 35 (L) 12/27/2019 10:29 AM   CHOLHDL 2.5 12/27/2019 10:29 AM   CHOLHDL 4 12/24/2016 11:03 AM   LDLCALC 32 12/27/2019 10:29 AM   LDLDIRECT 35.0 12/24/2016 11:03 AM    Physical Exam:    VS:  BP 112/64   Pulse 70   Ht _0  (1.676 m)   Wt 147 lb (66.7 kg)   SpO2 99%   BMI 23.73 kg/m     Wt Readings from Last 3 Encounters:  05/22/20 147 lb (66.7 kg)  04/05/20 149 lb (67.6 kg)  01/05/20 155 lb (70.3 kg)     Constitutional:      Appearance: Healthy appearance. Not in distress.  Neck:     Vascular: JVD normal.  Pulmonary:     Effort: Pulmonary effort is normal.     Breath sounds: No wheezing. No rales.  Cardiovascular:     Normal rate. Regular rhythm. Normal S1. Normal S2.     Murmurs: There is a grade 2/6 holosystolic murmur at the LLSB.  Edema:    Pretibial: bilateral trace edema of the pretibial area.    Ankle: bilateral trace edema of the ankle. Abdominal:     Palpations: Abdomen is soft.  Skin:    General: Skin is warm and dry.  Neurological:     General: No focal deficit present.     Mental Status: Alert and oriented to person, place and time.     Cranial Nerves: Cranial nerves are intact.       ASSESSMENT & PLAN:    1. Coronary artery disease involving native coronary artery of native heart with angina pectoris (HCC) Hx of CABG.  2/3 grafts occluded at cath in 2017.  He underwent DES x 2 to RI at that time.  Over the past month, he has had exertional angina (CCS class II).  His ECG does demonstrate more dramatic T wave inversions in 2, 3, aVF, V4-V6.  Some of this may be related to QRS widening, incomplete right bundle branch block.  He has not had unstable symptoms.  I reviewed his case today with Dr. Acie Fredrickson.  Given his advanced age and chronic kidney disease, we would like to avoid catheterization if at all possible.   Therefore, I will try to further advance his medical therapy.  -Add amlodipine 2.5 mg daily  -Continue current dose of carvedilol, clopidogrel, isosorbide, ranolazine, atorvastatin  -Follow-up in 6 weeks  -He knows to return sooner if his symptoms should progress or go to the ED for unstable symptoms  2. Chronic systolic CHF (congestive heart failure) ([place]) 3. Shortness of breath EF 30-35 by echocardiogram June 2020.  Ischemic cardiomyopathy.  NYHA II-IIb.  Recently, his diuretic was adjusted due to decreasing thoracic impedance on device interrogation.  By exam today, he appears to be stable from a volume standpoint.  However, he does note that his chest discomfort improved with increasing his dose of furosemide for a few days.  I will obtain a repeat BMET, BNP as well as a CBC today.  If his BNP is significantly elevated, I will adjust his furosemide to 40 mg twice a day.  4. Stage 3b chronic kidney disease Recent creatinine 1.85.  He would be at high risk for contrast-induced nephropathy should he undergo cardiac catheterization.  I reviewed this with the patient today.  Obtain BMET as noted.  5. Essential hypertension Blood pressure is stable.  I will decrease his irbesartan to 75 mg daily to allow room in his blood pressure to take amlodipine.     Dispo:  No follow-ups on file.   Medication Adjustments/Labs and Tests Ordered: Current medicines are reviewed at length with the patient today.  Concerns regarding medicines are outlined above.  Tests Ordered: Orders Placed This Encounter  Procedures  . Basic metabolic panel  . CBC  . Pro b natriuretic peptide (BNP)  . EKG 12-Lead   Medication Changes: Meds ordered this encounter  Medications  . irbesartan (AVAPRO) 150 MG tablet    Sig: Take 0.5 tablets (75 mg total) by mouth daily.  Dispense:  15 tablet    Refill:  0  . amLODipine (NORVASC) 2.5 MG tablet    Sig: Take 1 tablet (2.5 mg total) by mouth daily.    Dispense:  30  tablet    Refill:  0    Signed, Richardson Dopp, PA-C  05/22/2020 11:45 AM    [facility] [address], [place], [place]  [address] Phone: [phone]; Fax: [phone]

## 2020-05-22 ENCOUNTER — Other Ambulatory Visit: Payer: Self-pay

## 2020-05-22 ENCOUNTER — Ambulatory Visit (INDEPENDENT_AMBULATORY_CARE_PROVIDER_SITE_OTHER): Payer: Medicare Other | Admitting: Physician Assistant

## 2020-05-22 ENCOUNTER — Encounter: Payer: Self-pay | Admitting: Physician Assistant

## 2020-05-22 VITALS — BP 112/64 | HR 70 | Ht 66.0 in | Wt 147.0 lb

## 2020-05-22 DIAGNOSIS — R0602 Shortness of breath: Secondary | ICD-10-CM | POA: Diagnosis not present

## 2020-05-22 DIAGNOSIS — I10 Essential (primary) hypertension: Secondary | ICD-10-CM | POA: Diagnosis not present

## 2020-05-22 DIAGNOSIS — I5022 Chronic systolic (congestive) heart failure: Secondary | ICD-10-CM | POA: Diagnosis not present

## 2020-05-22 DIAGNOSIS — N1832 Chronic kidney disease, stage 3b: Secondary | ICD-10-CM

## 2020-05-22 DIAGNOSIS — I25119 Atherosclerotic heart disease of native coronary artery with unspecified angina pectoris: Secondary | ICD-10-CM | POA: Diagnosis not present

## 2020-05-22 MED ORDER — IRBESARTAN 150 MG PO TABS: 75.0000 mg | ORAL_TABLET | Freq: Every day | ORAL | 0 refills | Status: DC

## 2020-05-22 MED ORDER — AMLODIPINE BESYLATE 2.5 MG PO TABS: 2.5000 mg | ORAL_TABLET | Freq: Every day | ORAL | 0 refills | Status: DC

## 2020-05-22 MED ORDER — IRBESARTAN 150 MG PO TABS: 75.0000 mg | ORAL_TABLET | Freq: Every day | ORAL | 3 refills | Status: DC

## 2020-05-22 NOTE — Patient Instructions (Signed)
Medication Instructions:  Your physician has recommended you make the following change in your medication:   1) Decrease Irbesartan to 75 mg, 0.5 tablet by mouth once a day 2) Start Amlodipine 2.5 mg, 1 tablet by mouth once a day  *If you need a refill on your cardiac medications before your next appointment, please call your pharmacy*  Lab Work: You will have labs drawn today: BMET/CBC/BNP  If you have labs (blood work) drawn today and your tests are completely normal, you will receive your results only by: Marland Kitchen MyChart Message (if you have MyChart) OR . A paper copy in the mail If you have any lab test that is abnormal or we need to change your treatment, we will call you to review the results.  Testing/Procedures: None ordered today  Follow-Up: On 07/11/20 at 11:20AM with Mertie Moores, MD

## 2020-05-23 ENCOUNTER — Telehealth: Payer: Self-pay

## 2020-05-23 DIAGNOSIS — I5022 Chronic systolic (congestive) heart failure: Secondary | ICD-10-CM

## 2020-05-23 DIAGNOSIS — I255 Ischemic cardiomyopathy: Secondary | ICD-10-CM

## 2020-05-23 DIAGNOSIS — R0602 Shortness of breath: Secondary | ICD-10-CM

## 2020-05-23 DIAGNOSIS — I10 Essential (primary) hypertension: Secondary | ICD-10-CM

## 2020-05-23 LAB — CBC
Hematocrit: 30.2 % — ABNORMAL LOW (ref 37.5–51.0)
Hemoglobin: 10.6 g/dL — ABNORMAL LOW (ref 13.0–17.7)
MCH: 29.6 pg (ref 26.6–33.0)
MCHC: 35.1 g/dL (ref 31.5–35.7)
MCV: 84 fL (ref 79–97)
Platelets: 222 10*3/uL (ref 150–450)
RBC: 3.58 x10E6/uL — ABNORMAL LOW (ref 4.14–5.80)
RDW: 15 % (ref 11.6–15.4)
WBC: 6.5 10*3/uL (ref 3.4–10.8)

## 2020-05-23 LAB — BASIC METABOLIC PANEL
BUN/Creatinine Ratio: 28 — ABNORMAL HIGH (ref 10–24)
BUN: 45 mg/dL — ABNORMAL HIGH (ref 8–27)
CO2: 23 mmol/L (ref 20–29)
Calcium: 9 mg/dL (ref 8.6–10.2)
Chloride: 101 mmol/L (ref 96–106)
Creatinine, Ser: 1.58 mg/dL — ABNORMAL HIGH (ref 0.76–1.27)
GFR calc Af Amer: 44 mL/min/{1.73_m2} — ABNORMAL LOW (ref >59)
GFR calc non Af Amer: 38 mL/min/{1.73_m2} — ABNORMAL LOW (ref >59)
Glucose: 90 mg/dL (ref 65–99)
Potassium: 4.8 mmol/L (ref 3.5–5.2)
Sodium: 138 mmol/L (ref 134–144)

## 2020-05-23 LAB — PRO B NATRIURETIC PEPTIDE: NT-Pro BNP: 5067 pg/mL — ABNORMAL HIGH (ref 0–486)

## 2020-05-23 NOTE — Telephone Encounter (Signed)
-----   Message from Liliane Shi, PA-C sent at 05/23/2020  3:16 PM EDT ----- Creatinine, Hgb stable.  K+ normal.  BNP significantly elevated. PLAN:   -Increase furosemide to 60 mg in the morning and 40 mg in the afternoon  -Obtain a BMET 1 week Richardson Dopp, PA-C    05/23/2020 3:13 PM

## 2020-05-23 NOTE — Telephone Encounter (Signed)
The patient has been notified of the result and verbalized understanding. All questions (if any) were answered. Patient will increase Furosemide to 60 mg; 1.5 tablets in the morning and 40 mg; 1 tablet in the afternoon. Patient will come back on 05/31/20 for repeat BMET, orders in. Mady Haagensen, [facility] 05/23/2020 4:22 PM

## 2020-05-24 ENCOUNTER — Other Ambulatory Visit: Payer: Medicare Other

## 2020-05-28 DIAGNOSIS — G4733 Obstructive sleep apnea (adult) (pediatric): Secondary | ICD-10-CM | POA: Diagnosis not present

## 2020-05-30 ENCOUNTER — Other Ambulatory Visit: Payer: Medicare Other | Admitting: *Deleted

## 2020-05-30 ENCOUNTER — Other Ambulatory Visit: Payer: Self-pay

## 2020-05-30 DIAGNOSIS — I255 Ischemic cardiomyopathy: Secondary | ICD-10-CM

## 2020-05-30 DIAGNOSIS — I5022 Chronic systolic (congestive) heart failure: Secondary | ICD-10-CM | POA: Diagnosis not present

## 2020-05-30 DIAGNOSIS — R0602 Shortness of breath: Secondary | ICD-10-CM

## 2020-05-30 DIAGNOSIS — I10 Essential (primary) hypertension: Secondary | ICD-10-CM

## 2020-05-30 LAB — BASIC METABOLIC PANEL
BUN/Creatinine Ratio: 32 — ABNORMAL HIGH (ref 10–24)
BUN: 52 mg/dL — ABNORMAL HIGH (ref 8–27)
CO2: 23 mmol/L (ref 20–29)
Calcium: 8.9 mg/dL (ref 8.6–10.2)
Chloride: 104 mmol/L (ref 96–106)
Creatinine, Ser: 1.63 mg/dL — ABNORMAL HIGH (ref 0.76–1.27)
GFR calc Af Amer: 43 mL/min/{1.73_m2} — ABNORMAL LOW (ref >59)
GFR calc non Af Amer: 37 mL/min/{1.73_m2} — ABNORMAL LOW (ref >59)
Glucose: 97 mg/dL (ref 65–99)
Potassium: 4.9 mmol/L (ref 3.5–5.2)
Sodium: 139 mmol/L (ref 134–144)

## 2020-05-31 ENCOUNTER — Other Ambulatory Visit: Payer: Medicare Other

## 2020-05-31 ENCOUNTER — Telehealth: Payer: Self-pay | Admitting: *Deleted

## 2020-05-31 DIAGNOSIS — N1832 Chronic kidney disease, stage 3b: Secondary | ICD-10-CM

## 2020-05-31 DIAGNOSIS — I25119 Atherosclerotic heart disease of native coronary artery with unspecified angina pectoris: Secondary | ICD-10-CM

## 2020-05-31 DIAGNOSIS — I10 Essential (primary) hypertension: Secondary | ICD-10-CM

## 2020-05-31 DIAGNOSIS — I5022 Chronic systolic (congestive) heart failure: Secondary | ICD-10-CM

## 2020-05-31 DIAGNOSIS — I110 Hypertensive heart disease with heart failure: Secondary | ICD-10-CM

## 2020-05-31 DIAGNOSIS — I2510 Atherosclerotic heart disease of native coronary artery without angina pectoris: Secondary | ICD-10-CM

## 2020-05-31 NOTE — Telephone Encounter (Signed)
Spoke with the pt and endorsed lab results and recommendations, per [employer].  Scheduled the pt to come in for repeat BMET in 2 weeks on 06/14/20.  Pt verbalized understanding and agrees with this plan.

## 2020-05-31 NOTE — Telephone Encounter (Signed)
-----   Message from Liliane Shi, [place] sent at 05/31/2020  1:42 PM EDT ----- Creatinine stable.  K+ normal. PLAN:  - Continue current medications/treatment plan and follow up as scheduled. - Repeat BMET 2 weeks Richardson Dopp, PA-C    05/31/2020 1:40 PM

## 2020-06-04 ENCOUNTER — Other Ambulatory Visit: Payer: Self-pay | Admitting: *Deleted

## 2020-06-04 MED ORDER — FUROSEMIDE 40 MG PO TABS: ORAL_TABLET | ORAL | 1 refills | Status: DC

## 2020-06-10 ENCOUNTER — Other Ambulatory Visit: Payer: Self-pay | Admitting: Cardiovascular Disease

## 2020-06-14 ENCOUNTER — Other Ambulatory Visit: Payer: Self-pay

## 2020-06-14 ENCOUNTER — Other Ambulatory Visit: Payer: Medicare Other | Admitting: *Deleted

## 2020-06-14 DIAGNOSIS — N1832 Chronic kidney disease, stage 3b: Secondary | ICD-10-CM | POA: Diagnosis not present

## 2020-06-14 DIAGNOSIS — I5022 Chronic systolic (congestive) heart failure: Secondary | ICD-10-CM | POA: Diagnosis not present

## 2020-06-14 DIAGNOSIS — I2510 Atherosclerotic heart disease of native coronary artery without angina pectoris: Secondary | ICD-10-CM

## 2020-06-14 DIAGNOSIS — I110 Hypertensive heart disease with heart failure: Secondary | ICD-10-CM

## 2020-06-14 DIAGNOSIS — I10 Essential (primary) hypertension: Secondary | ICD-10-CM

## 2020-06-14 DIAGNOSIS — I25119 Atherosclerotic heart disease of native coronary artery with unspecified angina pectoris: Secondary | ICD-10-CM

## 2020-06-14 LAB — BASIC METABOLIC PANEL
BUN/Creatinine Ratio: 22 (ref 10–24)
BUN: 39 mg/dL — ABNORMAL HIGH (ref 8–27)
CO2: 24 mmol/L (ref 20–29)
Calcium: 9.2 mg/dL (ref 8.6–10.2)
Chloride: 98 mmol/L (ref 96–106)
Creatinine, Ser: 1.74 mg/dL — ABNORMAL HIGH (ref 0.76–1.27)
GFR calc Af Amer: 40 mL/min/{1.73_m2} — ABNORMAL LOW (ref >59)
GFR calc non Af Amer: 34 mL/min/{1.73_m2} — ABNORMAL LOW (ref >59)
Glucose: 112 mg/dL — ABNORMAL HIGH (ref 65–99)
Potassium: 4.3 mmol/L (ref 3.5–5.2)
Sodium: 136 mmol/L (ref 134–144)

## 2020-06-15 ENCOUNTER — Telehealth: Payer: Self-pay | Admitting: Physician Assistant

## 2020-06-15 DIAGNOSIS — N1832 Chronic kidney disease, stage 3b: Secondary | ICD-10-CM

## 2020-06-15 DIAGNOSIS — I10 Essential (primary) hypertension: Secondary | ICD-10-CM

## 2020-06-15 MED ORDER — FUROSEMIDE 40 MG PO TABS: 60.0000 mg | ORAL_TABLET | Freq: Every day | ORAL | 1 refills | Status: DC

## 2020-06-15 NOTE — Telephone Encounter (Signed)
The patient has been notified of the result and verbalized understanding.  All questions (if any) were answered. Patient will reduce Furosemide to 60 mg (1.5 tablets) once a day. Patient will come back on 06/22/20 for repeat BMET. Mady Haagensen, [place] 06/15/2020 12:51 PM

## 2020-06-15 NOTE — Telephone Encounter (Signed)
   Pt is returning Emily's call for his lab result

## 2020-06-17 ENCOUNTER — Other Ambulatory Visit: Payer: Self-pay | Admitting: Physician Assistant

## 2020-06-22 ENCOUNTER — Other Ambulatory Visit: Payer: Medicare Other | Admitting: *Deleted

## 2020-06-22 ENCOUNTER — Other Ambulatory Visit: Payer: Self-pay

## 2020-06-22 DIAGNOSIS — I10 Essential (primary) hypertension: Secondary | ICD-10-CM | POA: Diagnosis not present

## 2020-06-22 DIAGNOSIS — N1832 Chronic kidney disease, stage 3b: Secondary | ICD-10-CM | POA: Diagnosis not present

## 2020-06-22 LAB — BASIC METABOLIC PANEL
BUN/Creatinine Ratio: 25 — ABNORMAL HIGH (ref 10–24)
BUN: 41 mg/dL — ABNORMAL HIGH (ref 8–27)
CO2: 22 mmol/L (ref 20–29)
Calcium: 8.8 mg/dL (ref 8.6–10.2)
Chloride: 100 mmol/L (ref 96–106)
Creatinine, Ser: 1.63 mg/dL — ABNORMAL HIGH (ref 0.76–1.27)
GFR calc Af Amer: 43 mL/min/{1.73_m2} — ABNORMAL LOW (ref >59)
GFR calc non Af Amer: 37 mL/min/{1.73_m2} — ABNORMAL LOW (ref >59)
Glucose: 109 mg/dL — ABNORMAL HIGH (ref 65–99)
Potassium: 4.4 mmol/L (ref 3.5–5.2)
Sodium: 135 mmol/L (ref 134–144)

## 2020-07-06 ENCOUNTER — Other Ambulatory Visit: Payer: Self-pay | Admitting: Cardiovascular Disease

## 2020-07-10 ENCOUNTER — Encounter: Payer: Self-pay | Admitting: Cardiovascular Disease

## 2020-07-10 NOTE — Progress Notes (Signed)
Cardiology Office Note   Date:  07/10/2020   ID:  [name] Curia., DOB [birth year], MRN [id number]  PCP:  Cassandria Anger, MD  Cardiologist:  Mertie Moores, MD   Problem list 1. Coronary artery disease-status post coronary artery bypass grafting 2. Hyperlipidemia 3. Chronic systolic congestive heart failure - EF 35-40% 4. ICD -  5.  OSA - Fransico Him, MD   Chief Complaint  Patient presents with  . Coronary Artery Disease  . Congestive Heart Failure  . Hypertension      Notes prior to 2016  [name] [name]. is a 86 y.o. male who presents today to follow-up coronary disease and ischemic cardiomyopathy and a history of hypertension. He did have an episode of syncope in the past that was related to hypotension. More recently his blood pressure has been elevated. He was hesitant to have his ramapril dose increased because he associated this with his syncopal episode in the past. Losartan was added to his medications. He is getting excellent control and I have left him on both. He feels good. There is no chest pain. He is fully active. He has an ICD in place that is followed carefully by electrophysiology.  Retired [employer] ( [place] )   Nov. 1, 2016:  Doing well.  No CP or dyspnea. 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   January 08, 2016:  See for follow up for his CAD / CABG, chronic systolic CHF , HTN Doing well.    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   Stays active  Does not ride anymore.  Has some fatigue , no CP   Jan. 4, 2018:  Still taking care of his horses.   Feeds and hay twice  No CP , no dyspnea.   Aug. 29, 2018:  [name] [name] is doing well from a cardiac standpoint  Has leg pain . 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 25, 2019:  Doing well .  Gets fatigued when he goes down to take care of the horses.  No CP or dyspnea .  Some DOE with working   May 25, 2018: Seen for follow  Still having some chest pain .   Lasted 30 minutes.    Took NTG with relief. Chest tightness with activity Has known severe CAD  With patent LIMA. Had 2 DES stents to his ramus Int. In 2017.  His symptoms are not nearly as severe as they were in 2017 when he was sent to the hospital and had stents placed in the ramus intermediate branch.  September 03, 2018: [name] [name] is seen today for follow-up of his coronary artery disease He has had an ICD placed for primary prevention. Was having some dyspnea,   Dr. Caryl Comes increased Imdur to 60  Mg a day which has helped.   Has swelling   March 08, 2019 :  I saw [name] [name] as a telemedicine visit on Jan 25, 2019 .  Having more chest pressure with exertion .  Has marked DOE , when dressing  Legs feel heavy ,  Has leg edema Avoids salty foods.  Takes NTG which has helped the chest tightness.  The IMdur has helped.   June 28, 2019:  [name] [name] is seen today for follow-up visit.  He has a history of coronary artery disease and is status post coronary  artery bypass grafting.  He has an ICD placed for primary prevention. Gets DOE with getting dressed and working with the horses.  I reviewed his cardiac catheterization films from 2017.  He has a patent LIMA to the distal LAD.  He has patent circumflex artery.  There is disease in some of the small branches.  That the RCA is occluded.  Saphenous vein grafts are occluded.  Avoids salty foods.   Will increase Imdur to 120 mg BID and will add Ranexa 500 mg PO BID today   December 27, 2019: [name] [name] is an 86 year old gentleman with a history of coronary artery disease. Complains of not sleeping well at night  Complains of lack of energy  No CP ,  No dyspnea,  Just fatigue his VS look good today  He was started on Ranexa about a year ago .  Seems to be helping with his angina  I gave his the OK to have Avastin injections into his eye   Oct. 27, 2021 [name] [name] is seen for follow up of his  CAD, CABG, CHF Has some episodes of chest pressure  Does not use NTG  On Ranexa   Past Medical History:  Diagnosis Date  . AICD (automatic cardioverter/defibrillator) present   . Arrhythmia   . Arthritis    "hands, legs" (03/26/2016)  . 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 Chronic systolic CHF (congestive heart failure) ([place])    a. Echo 9/14: Mild LVH, mild focal basal septal hypertrophy, EF 35-40%, anteroseptal HK, normal diastolic function, mild MR, mild LAE, PASP 34 mmHg  . Chronotropic incompetence    treated w/pacemaker, ICD 7.2008. This was placed after CPX done post 2008 cath. CPX showed only chronotropic incompetence  . History of blood transfusion 1969   "after getting MSO4; it destroys my corpuscles" (03/26/2016)  . History of colon polyps   . Hyperlipidemia   . Hypertension   . Migraine    "just before my bypass" (03/26/2016)  . Mitral regurgitation    mild  . Myocardial infarction ([place]) "several"  . Numbness and tingling of left arm and leg    improved after tx w/prednisone  . OSA (obstructive sleep apnea) 09/01/2016   Severe OSA with AHI 38/hr now on BiPAP at 21/17cm H2O  . Syncopal episodes    relative hypotension    Past Surgical History:  Procedure Laterality Date  . BACK SURGERY    . CARDIAC CATHETERIZATION  1980s-1990s X 4  . CARDIAC CATHETERIZATION N/A 03/26/2016   Procedure: Left Heart Cath and Cors/Grafts Angiography;  Surgeon: Burnell Blanks, MD;  Location: [facility];  Service: Cardiovascular;  Laterality: N/A;  . CARDIAC CATHETERIZATION N/A 03/26/2016   Procedure: Coronary Stent Intervention;  Surgeon: Burnell Blanks, MD;  Location: [facility];  Service: Cardiovascular;  Laterality: N/A;  . CARDIAC DEFIBRILLATOR PLACEMENT  2008  . CATARACT EXTRACTION W/ INTRAOCULAR LENS  IMPLANT, BILATERAL Bilateral 08/2013 - 09/2013   right - left   . CORONARY ANGIOPLASTY WITH STENT PLACEMENT  ~ 1992; 03/26/2016   "1 + 2"  . CORONARY ARTERY BYPASS GRAFT  1992   CABG "X4"  .  CYSTOSCOPY  1980s  . GAS/FLUID EXCHANGE Right 06/18/2017   Procedure: GAS/FLUID EXCHANGE (C3F8);  Surgeon: Sherlynn Stalls, MD;  Location: [facility];  Service: Ophthalmology;  Laterality: Right;  . IMPLANTABLE CARDIOVERTER DEFIBRILLATOR (ICD) GENERATOR CHANGE N/A 06/21/2014   Procedure: ICD GENERATOR CHANGE;  Surgeon: Deboraha Sprang, MD;  [facility];  Service: Cardiovascular;  Laterality: N/A;  . PARS PLANA VITRECTOMY Right 06/18/2017   Procedure: PARS PLANA VITRECTOMY WITH 25 GAUGE;  Surgeon: Sherlynn Stalls, MD;  Location: [facility];  Service: Ophthalmology;  Laterality: Right;  . PHOTOCOAGULATION WITH LASER Right 06/18/2017   Procedure: PHOTOCOAGULATION WITH LASER- ENDO LASER;  Surgeon: Sherlynn Stalls, MD;  Location: [facility];  Service: Ophthalmology;  Laterality: Right;  . POSTERIOR LUMBAR FUSION  ~ 1989   "fused 2 discs"  . TIBIA FRACTURE SURGERY Left 1943   "put artificial bone in there"    Patient Active Problem List   Diagnosis Date Noted  . Chronic systolic CHF (congestive heart failure) ([place]) 11/18/2013    Priority: High  . CAD (coronary artery disease)     Priority: High  . Insomnia 01/05/2020  . Cat bite 07/20/2019  . DOE (dyspnea on exertion) 05/31/2019  . Edema 05/31/2019  . Varicose veins of both lower extremities without ulcer or inflammation 02/02/2018  . Allergic rhinitis 12/29/2017  . HLD (hyperlipidemia) 11/09/2017  . Left lateral epicondylitis 07/02/2017  . Knee pain, right 12/24/2016  . OSA (obstructive sleep apnea) 09/01/2016  . Status post coronary artery stent placement   . Hypertensive heart disease with heart failure ([place])   . Unstable  angina ([place])   . Thumb pain 05/04/2015  . Ejection fraction   . Well adult exam 07/13/2012  . Chronic renal insufficiency, stage III (moderate) ([place]) 07/21/2011  . Ischemic cardiomyopathy   . Hx of CABG   . Chronotropic incompetence   . Syncopal episodes   . Mitral regurgitation   . Numbness and tingling of left arm and leg   . Arm pain, diffuse, left 10/22/2009  . PARESTHESIA 10/22/2009  . TOBACCO USE, QUIT 10/22/2009  . SEBACEOUS CYST 08/27/2009  . Cough 02/28/2009  . Anemia, chronic disease 02/18/2008  . Dyslipidemia 05/03/2007  . Hypertensive heart disease 05/03/2007  . COLONIC POLYPS, HX OF 05/03/2007  . Automatic implantable cardioverter-defibrillator in situ 03/16/2007      Current Outpatient Medications  Medication Sig Dispense Refill  . amLODipine (NORVASC) 2.5 MG tablet TAKE 1 TABLET BY MOUTH EVERY DAY 90 tablet 3  . atorvastatin (LIPITOR) 40 MG tablet TAKE 1 TABLET DAILY 90 tablet 0  . carvedilol (COREG) 12.5 MG tablet TAKE 1 TABLET TWICE A DAY 180 tablet 3  . Cholecalciferol 1000 UNITS tablet Take 1,000 Units by mouth daily.      . clopidogrel (PLAVIX) 75 MG tablet TAKE 1 TABLET DAILY 90 tablet 3  . furosemide (LASIX) 40 MG tablet Take 1.5 tablets (60 mg total) by mouth daily. 135 tablet 1  . irbesartan (AVAPRO) 150 MG tablet Take 0.5 tablets (75 mg total) by mouth daily. 45 tablet 3  . isosorbide mononitrate (IMDUR) 120 MG 24 hr tablet TAKE 1 TABLET DAILY 90 tablet 2  . Multiple Vitamins-Minerals (PRESERVISION AREDS 2) CAPS Take 1 capsule by mouth daily.     . nitroGLYCERIN (NITROLINGUAL) 0.4 MG/SPRAY spray Place 1 spray under the tongue every 5 (five) minutes x 3 doses as needed for chest pain. 12 g 3  . potassium chloride (KLOR-CON) 10 MEQ tablet TAKE 1 TABLET DAILY  90 tablet 0  . ranolazine (RANEXA) 1000 MG SR tablet Take 1 tablet (1,000 mg total) by mouth 2 (two) times daily. 180 tablet 3  . traZODone (DESYREL) 50 MG tablet Take 0.5-1 tablets (25-50 mg total) by  mouth at bedtime as needed for sleep. 30 tablet 5   No current facility-administered medications for this visit.    Allergies:   Morphine and Ramipril    Social History:  The patient  reports that he has quit smoking. His smoking use included cigars. He quit after 29.00 years of use. He has never used smokeless tobacco. He reports previous alcohol use. He reports that he does not use drugs.   Family History:  The patient's family history includes Colon cancer in an other family member; Hypertension in his father, mother, and another family member.    ROS: Noted in current history, otherwise review of systems is negative.   Physical Exam: There were no vitals taken for this visit.  GEN:  Well nourished, well developed in no acute distress HEENT: Normal NECK: No JVD; No carotid bruits LYMPHATICS: No lymphadenopathy CARDIAC: RRR, 2/6 systolic murmur  RESPIRATORY:  Clear to auscultation without rales, wheezing or rhonchi  ABDOMEN: Soft, non-tender, non-distended MUSCULOSKELETAL:  No edema; No deformity  SKIN: Warm and dry NEUROLOGIC:  Alert and oriented x 3    EKG:      Recent Labs: 12/27/2019: ALT 13 05/22/2020: Hemoglobin 10.6; NT-Pro BNP 5,067; Platelets 222 06/22/2020: BUN 41; Creatinine, Ser 1.63; Potassium 4.4; Sodium 135    Lipid Panel    Component Value Date/Time   CHOL 88 (L) 12/27/2019 1029   TRIG 117 12/27/2019 1029   HDL 35 (L) 12/27/2019 1029   CHOLHDL 2.5 12/27/2019 1029   CHOLHDL 4 12/24/2016 1103   VLDL 41.8 (H) 12/24/2016 1103   LDLCALC 32 12/27/2019 1029   LDLDIRECT 35.0 12/24/2016 1103      Wt Readings from Last 3 Encounters:  05/22/20 147 lb (66.7 kg)  04/05/20 149 lb (67.6 kg)  01/05/20 155 lb (70.3 kg)      Current medicines are reviewed  The patient understands his medications.   ASSESSMENT AND PLAN:  1. Coronary artery disease-status post coronary artery bypass grafting he has only 1 of 3 grafts are patent-his LIMA to LAD.    Ive  reviewed his heart catheterization from 2017.  His RCA is severely diseased.  He has an occluded saphenous vein grafts.  His LIMA is patent.  His creatinine is 1.63.  I am worried that heart catheterization might result in renal failure.  At his age of 38 years am not sure that he is a candidate for dialysis.  I have encouraged him to take as many nitroglycerin as he would like.  Continue Ranexa.  We will have him see Scott in 6 months. We can consider continue to consider heart catheterization if he starts to have worsening angina that is not controlled with medical therapy.   2. Hyperlipidemia -     Labs have been stable .   Cont meds    3. Chronic systolic congestive heart failure -  EF is 35%.   Moderate MR.  Cont medical therapy    4. ICD -  Managed by EP    5. Obstructive sleep apnea -   Mertie Moores, MD  07/10/2020 3:57 PM    [address] [place],  [place], [place]  [address] Pager [phone] Phone: [phone]; Fax: [phone]   

## 2020-07-11 ENCOUNTER — Encounter: Payer: Self-pay | Admitting: Cardiovascular Disease

## 2020-07-11 ENCOUNTER — Other Ambulatory Visit: Payer: Self-pay

## 2020-07-11 ENCOUNTER — Ambulatory Visit (INDEPENDENT_AMBULATORY_CARE_PROVIDER_SITE_OTHER): Payer: Medicare Other | Admitting: Cardiovascular Disease

## 2020-07-11 VITALS — BP 98/62 | HR 72 | Ht 66.0 in | Wt 155.6 lb

## 2020-07-11 DIAGNOSIS — I340 Nonrheumatic mitral (valve) insufficiency: Secondary | ICD-10-CM | POA: Diagnosis not present

## 2020-07-11 DIAGNOSIS — I25119 Atherosclerotic heart disease of native coronary artery with unspecified angina pectoris: Secondary | ICD-10-CM | POA: Diagnosis not present

## 2020-07-11 DIAGNOSIS — I5022 Chronic systolic (congestive) heart failure: Secondary | ICD-10-CM

## 2020-07-11 MED ORDER — NITROGLYCERIN 0.4 MG/SPRAY TL SOLN: 1.0000 | 3 refills | Status: DC | PRN

## 2020-07-11 NOTE — Patient Instructions (Signed)
Medication Instructions:  NO CHANGES *If you need a refill on your cardiac medications before your next appointment, please call your pharmacy*   Lab Work: NONE If you have labs (blood work) drawn today and your tests are completely normal, you will receive your results only by: Marland Kitchen MyChart Message (if you have MyChart) OR . A paper copy in the mail If you have any lab test that is abnormal or we need to change your treatment, we will call you to review the results.   Testing/Procedures: NONE   Follow-Up: At [facility], you and your health needs are our priority.  As part of our continuing mission to provide you with exceptional heart care, we have created designated Provider Care Teams.  These Care Teams include your primary Cardiologist (physician) and Advanced Practice Providers (APPs -  Physician Assistants and Nurse Practitioners) who all work together to provide you with the care you need, when you need it.  We recommend signing up for the patient portal called "MyChart".  Sign up information is provided on this After Visit Summary.  MyChart is used to connect with patients for Virtual Visits (Telemedicine).  Patients are able to view lab/test results, encounter notes, upcoming appointments, etc.  Non-urgent messages can be sent to your provider as well.   To learn more about what you can do with MyChart, go to [email or web address].    Your next appointment:   6 month(s)  The format for your next appointment:   In Person  Provider:   You will see one of the following Advanced Practice Providers on your designated Care Team:    Richardson Dopp, [place]    Other Instructions

## 2020-07-17 ENCOUNTER — Other Ambulatory Visit: Payer: Self-pay | Admitting: Physician Assistant

## 2020-07-26 DIAGNOSIS — H353111 Nonexudative age-related macular degeneration, right eye, early dry stage: Secondary | ICD-10-CM | POA: Diagnosis not present

## 2020-07-26 DIAGNOSIS — H353223 Exudative age-related macular degeneration, left eye, with inactive scar: Secondary | ICD-10-CM | POA: Diagnosis not present

## 2020-07-26 DIAGNOSIS — H43811 Vitreous degeneration, right eye: Secondary | ICD-10-CM | POA: Diagnosis not present

## 2020-07-26 DIAGNOSIS — H35372 Puckering of macula, left eye: Secondary | ICD-10-CM | POA: Diagnosis not present

## 2020-07-30 ENCOUNTER — Ambulatory Visit (INDEPENDENT_AMBULATORY_CARE_PROVIDER_SITE_OTHER): Payer: Medicare Other

## 2020-07-30 DIAGNOSIS — I5022 Chronic systolic (congestive) heart failure: Secondary | ICD-10-CM | POA: Diagnosis not present

## 2020-07-31 LAB — CUP PACEART REMOTE DEVICE CHECK
Battery Remaining Longevity: 28 mo
Battery Remaining Percentage: 35 %
Battery Voltage: 2.86 V
Brady Statistic AP VP Percent: 22 %
Brady Statistic AP VS Percent: 76 %
Brady Statistic AS VP Percent: 1 %
Brady Statistic AS VS Percent: 1.5 %
Brady Statistic RA Percent Paced: 98 %
Brady Statistic RV Percent Paced: 22 %
Date Time Interrogation Session: 20211115101004
HighPow Impedance: 68 Ohm
HighPow Impedance: 68 Ohm
Implantable Lead Implant Date: 20080711
Implantable Lead Implant Date: 20080711
Implantable Lead Location: 753859
Implantable Lead Location: 753860
Implantable Lead Model: 7122
Implantable Pulse Generator Implant Date: 20151007
Lead Channel Impedance Value: 350 Ohm
Lead Channel Impedance Value: 390 Ohm
Lead Channel Pacing Threshold Amplitude: 0.5 V
Lead Channel Pacing Threshold Amplitude: 0.625 V
Lead Channel Pacing Threshold Pulse Width: 0.5 ms
Lead Channel Pacing Threshold Pulse Width: 0.5 ms
Lead Channel Sensing Intrinsic Amplitude: 2.2 mV
Lead Channel Sensing Intrinsic Amplitude: 9.9 mV
Lead Channel Setting Pacing Amplitude: 1.625
Lead Channel Setting Pacing Amplitude: 2.5 V
Lead Channel Setting Pacing Pulse Width: 0.5 ms
Lead Channel Setting Sensing Sensitivity: 0.5 mV
Pulse Gen Serial Number: 7225183

## 2020-08-01 NOTE — Progress Notes (Signed)
Remote ICD transmission.   

## 2020-08-22 ENCOUNTER — Other Ambulatory Visit: Payer: Self-pay | Admitting: Cardiovascular Disease

## 2020-09-12 ENCOUNTER — Telehealth: Payer: Self-pay | Admitting: Cardiovascular Disease

## 2020-09-12 NOTE — Telephone Encounter (Signed)
New Message  Pt c/o Shortness Of Breath: STAT if SOB developed within the last 24 hours or pt is noticeably SOB on the phone  1. Are you currently SOB (can you hear that pt is SOB on the phone)? Yes SOB often, per patients wife   2. How long have you been experiencing SOB? A few weeks, but it is increasing   3. Are you SOB when sitting or when up moving around? Both   4. Are you currently experiencing any other symptoms? Pt is feeling very weak, pt said he is having some pain in his chest. It comes and goes, pt used Nitroglycerin Just now. Pt feels cold   Pt c/o of Chest Pain: STAT if CP now or developed within 24 hours  1. Are you having CP right now? Right now, just used Nitroglycerin   2. Are you experiencing any other symptoms (ex. SOB, nausea, vomiting, sweating)? SOB  3. How long have you been experiencing CP? Asked question and patients wife says comes and goes....   4. Is your CP continuous or coming and going? Comes and goes   5. Have you taken Nitroglycerin? Yes, Just now  ?

## 2020-09-12 NOTE — Telephone Encounter (Signed)
Pts wife is calling to report to Dr. Elease Hashimoto that the pt is having active chest pain, sob, generalized weakness, and has taken one nitroglycerin. Wife states he has been experiencing generalized weakness over the last several weeks, and has also experienced mild sob with this. Wife states over the last 3 days, she has noticed his symptoms worsen, and now he is complaining of active chest pain this morning.  Wife states pt is having active chest pain, rated at an 8, after administration of nitroglycerin.  Wife states they do not monitor BP/HR at home.  Wife reports he is having sob and weakness with this.  Wife reports the pt has no N/V, diaphoresis, dizziness, pre-syncopal or syncopal episodes.  Pt does have known CAD, HTN, CHF, and history of unstable angina.  Wife is very concerned for the pt, and is seeking advisement. Advised the pts wife that given his active complaints and no relief of chest pain with nitroglycerin and unknown vitals, she should call EMS for the pt and have him transported to [facility] ER for further chest pain evaluation and work-up. Advised the pts wife to call them now.  Informed her given his symptoms and unknown BP reading at this time, I can't safely advise for him to use his nitro again.  Advised her to call EMS and they can start treating the pt accordingly. Informed the pts wife that I will route this message to Dr. Elease Hashimoto for his review, upon return to the office.  Wife verbalized understanding and agrees with this plan.  Wife agrees this is the safest option for the pt at this time.  Wife will call EMS now.  Wife was appreciative for all the assistance provided.

## 2020-09-14 DIAGNOSIS — G4733 Obstructive sleep apnea (adult) (pediatric): Secondary | ICD-10-CM | POA: Diagnosis not present

## 2020-09-25 ENCOUNTER — Other Ambulatory Visit: Payer: Self-pay | Admitting: Cardiovascular Disease

## 2020-10-04 ENCOUNTER — Ambulatory Visit (INDEPENDENT_AMBULATORY_CARE_PROVIDER_SITE_OTHER): Payer: Medicare Other | Admitting: Cardiovascular Disease

## 2020-10-04 ENCOUNTER — Other Ambulatory Visit: Payer: Self-pay

## 2020-10-04 ENCOUNTER — Encounter: Payer: Self-pay | Admitting: Cardiovascular Disease

## 2020-10-04 VITALS — BP 102/56 | HR 74 | Ht 66.0 in | Wt 162.0 lb

## 2020-10-04 DIAGNOSIS — I25119 Atherosclerotic heart disease of native coronary artery with unspecified angina pectoris: Secondary | ICD-10-CM

## 2020-10-04 DIAGNOSIS — I5022 Chronic systolic (congestive) heart failure: Secondary | ICD-10-CM | POA: Diagnosis not present

## 2020-10-04 NOTE — Progress Notes (Signed)
Cardiology Office Note   Date:  10/04/2020   ID:  [name] [name]., DOB 06/12/1929, MRN [id number]  PCP:  Tresa Garter, MD  Cardiologist:  Kristeen Miss, MD   Problem list 1. Coronary artery disease-status post coronary artery bypass grafting 2. Hyperlipidemia 3. Chronic systolic congestive heart failure - EF 35-40% 4. ICD -  5. OSA - Armanda Magic, MD   Chief Complaint  Patient presents with  . Coronary Artery Disease      Notes prior to 2016  [name] [name]. is a 87 y.o. male who presents today to follow-up coronary disease and ischemic cardiomyopathy and a history of hypertension. He did have an episode of syncope in the past that was related to hypotension. More recently his blood pressure has been elevated. He was hesitant to have his ramapril dose increased because he associated this with his syncopal episode in the past. Losartan was added to his medications. He is getting excellent control and I have left him on both. He feels good. There is no chest pain. He is fully active. He has an ICD in place that is followed carefully by electrophysiology.  Retired [place] [employer] [place] [employer] ( [employer] )   Nov. 1, 2016:  Doing well.  No CP or dyspnea. 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   January 08, 2016:  See for follow up for his CAD / CABG, chronic systolic CHF , HTN Doing well.    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   Stays active  Does not ride anymore.  Has some fatigue , no CP   Jan. 4, 2018:  Still taking care of his horses.   Feeds and hay twice  No CP , no dyspnea.   Aug. 29, 2018:  [name] is doing well from a cardiac standpoint  Has leg pain . Still taking care of his 4 horses ( 2 are [place] mustangs from the [employer] )   Feb. 25, 2019:  Doing well .  Gets fatigued when he goes down to take care of the horses.  No CP or dyspnea .  Some DOE with working   May 25, 2018: Seen for follow  Still having some chest  pain .   Lasted 30 minutes.    Took NTG with relief. Chest tightness with activity Has known severe CAD  With patent LIMA. Had 2 DES stents to his ramus Int. In 2017.  His symptoms are not nearly as severe as they were in 2017 when he was sent to the hospital and had stents placed in the ramus intermediate branch.  September 03, 2018: [name] [name] is seen today for follow-up of his coronary artery disease He has had an ICD placed for primary prevention. Was having some dyspnea,   Dr. Graciela Husbands increased Imdur to 60  Mg a day which has helped.   Has swelling   March 08, 2019 :  I saw Steen as a telemedicine visit on Jan 25, 2019 .  Having more chest pressure with exertion .  Has marked DOE , when dressing  Legs feel heavy ,  Has leg edema Avoids salty foods.  Takes NTG which has helped the chest tightness.  The IMdur has helped.   June 28, 2019:  [name] [name] is seen today for follow-up visit.  He has a history of coronary artery disease and is status post coronary artery bypass grafting.  He has an ICD placed  for primary prevention. Gets DOE with getting dressed and working with the horses.  I reviewed his cardiac catheterization films from 2017.  He has a patent LIMA to the distal LAD.  He has patent circumflex artery.  There is disease in some of the small branches.  That the RCA is occluded.  Saphenous vein grafts are occluded.  Avoids salty foods.   Will increase Imdur to 120 mg BID and will add Ranexa 500 mg PO BID today   December 27, 2019: [name] is an 87 year old gentleman with a history of coronary artery disease. Complains of not sleeping well at night  Complains of lack of energy  No CP ,  No dyspnea,  Just fatigue his VS look good today  He was started on Ranexa about a year ago .  Seems to be helping with his angina  I gave his the OK to have Avastin injections into his eye   Oct. 27, 2021 [name] [name] is seen for follow up of his CAD, CABG, CHF Has some episodes of chest  pressure  Does not use NTG  On Ranexa  Jan. 20, 2022  [name] [name] is seen today for follow up visit for his CAD  Having more chest discomfort / chest pressure  Now gets out of breath with minimal exertion - has even while getting dressed.  NTG seems to help He is on Ranexa  Has lots of congestion in his chest  Echocardiogram from June, 2020 reveals moderate to severe LV dysfunction with ejection fraction of 30 to 35%.  He has grade 2 diastolic dysfunction. He takes lasix 60 mg a day scheduled  He has been hesitant to increse his lasix because of his CKD.    Past Medical History:  Diagnosis Date  . AICD (automatic cardioverter/defibrillator) present   . Arrhythmia   . Arthritis    "hands, legs" (03/26/2016)  . 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 Chronic systolic CHF (congestive heart failure) ([place])    a. Echo 9/14: Mild LVH, mild focal basal septal hypertrophy, EF 35-40%, anteroseptal HK, normal diastolic function, mild MR, mild LAE, PASP 34 mmHg  . Chronotropic incompetence    treated w/pacemaker, ICD 7.2008. This was placed after CPX done post 2008 cath. CPX showed only chronotropic incompetence  . History of blood transfusion 1969   "after getting MSO4; it destroys my corpuscles" (03/26/2016)  . History of colon polyps   . Hyperlipidemia   . Hypertension   . Migraine    "just before my bypass" (03/26/2016)  . Mitral regurgitation    mild  . Myocardial infarction ([place]) "several"  . Numbness and tingling of left arm and leg    improved after tx w/prednisone  . OSA (obstructive sleep apnea) 09/01/2016   Severe OSA with AHI 38/hr now on BiPAP at 21/17cm H2O  . Syncopal episodes    relative  hypotension    Past Surgical History:  Procedure Laterality Date  . BACK SURGERY    . CARDIAC CATHETERIZATION  1980s-1990s X 4  . CARDIAC CATHETERIZATION N/A 03/26/2016   Procedure: Left Heart Cath and Cors/Grafts Angiography;  Surgeon: Burnell Blanks, MD;  Location: [facility]  INVASIVE CV LAB;  Service: Cardiovascular;  Laterality: N/A;  . CARDIAC CATHETERIZATION N/A 03/26/2016   Procedure: Coronary Stent Intervention;  Surgeon: Burnell Blanks, MD;  Location: [facility] CV LAB;  Service: Cardiovascular;  Laterality: N/A;  . CARDIAC DEFIBRILLATOR PLACEMENT  2008  . CATARACT EXTRACTION W/ INTRAOCULAR LENS  IMPLANT, BILATERAL Bilateral 08/2013 - 09/2013   right - left   . CORONARY ANGIOPLASTY WITH STENT PLACEMENT  ~ 1992; 03/26/2016   "1 + 2"  . CORONARY ARTERY BYPASS GRAFT  1992   CABG "X4"  . CYSTOSCOPY  1980s  . GAS/FLUID EXCHANGE Right 06/18/2017   Procedure: GAS/FLUID EXCHANGE (C3F8);  Surgeon: Sherlynn Stalls, MD;  Location: [facility];  Service: Ophthalmology;  Laterality: Right;  . IMPLANTABLE CARDIOVERTER DEFIBRILLATOR (ICD) GENERATOR CHANGE N/A 06/21/2014   Procedure: ICD GENERATOR CHANGE;  Surgeon: Deboraha Sprang, MD;  Location: [facility] CATH LAB;  Service: Cardiovascular;  Laterality: N/A;  . PARS PLANA VITRECTOMY Right 06/18/2017   Procedure: PARS PLANA VITRECTOMY WITH 25 GAUGE;  Surgeon: Sherlynn Stalls, MD;  Location: [facility];  Service: Ophthalmology;  Laterality: Right;  . PHOTOCOAGULATION WITH LASER Right 06/18/2017   Procedure: PHOTOCOAGULATION WITH LASER- ENDO LASER;  Surgeon: Sherlynn Stalls, MD;  Location: [facility];  Service: Ophthalmology;  Laterality: Right;  . POSTERIOR LUMBAR FUSION  ~ 1989   "fused 2 discs"  . TIBIA FRACTURE SURGERY Left 1943   "put artificial bone in there"    Patient Active Problem List   Diagnosis Date Noted  . Chronic systolic CHF (congestive heart failure) ([place]) 11/18/2013    Priority: High  . CAD (coronary artery disease)     Priority: High  .  Insomnia 01/05/2020  . Cat bite 07/20/2019  . DOE (dyspnea on exertion) 05/31/2019  . Edema 05/31/2019  . Varicose veins of both lower extremities without ulcer or inflammation 02/02/2018  . Allergic rhinitis 12/29/2017  . HLD (hyperlipidemia) 11/09/2017  . Left lateral epicondylitis 07/02/2017  . Knee pain, right 12/24/2016  . OSA (obstructive sleep apnea) 09/01/2016  . Status post coronary artery stent placement   . Hypertensive heart disease with heart failure ([place])   . Unstable angina ([place])   . Thumb pain 05/04/2015  . Ejection fraction   . Well adult exam 07/13/2012  . Chronic renal insufficiency, stage III (moderate) ([place]) 07/21/2011  . Ischemic cardiomyopathy   . Hx of CABG   . Chronotropic incompetence   . Syncopal episodes   . Mitral regurgitation   . Numbness and tingling of left arm and leg   . Arm pain, diffuse, left 10/22/2009  . PARESTHESIA 10/22/2009  . TOBACCO USE, QUIT 10/22/2009  . SEBACEOUS CYST 08/27/2009  . Cough 02/28/2009  . Anemia, chronic disease 02/18/2008  . Dyslipidemia 05/03/2007  . Hypertensive heart disease 05/03/2007  . COLONIC POLYPS, HX OF 05/03/2007  . Automatic implantable cardioverter-defibrillator in situ 03/16/2007      Current Outpatient Medications  Medication Sig Dispense Refill  . amLODipine (NORVASC) 2.5 MG tablet TAKE 1 TABLET BY MOUTH EVERY DAY 90 tablet 3  . atorvastatin (LIPITOR) 40 MG tablet TAKE 1 TABLET DAILY 90 tablet 3  . carvedilol (COREG) 12.5 MG tablet TAKE 1 TABLET TWICE A DAY 180 tablet 3  . Cholecalciferol 1000 UNITS tablet Take 1,000 Units by mouth daily.    . clopidogrel (PLAVIX) 75 MG tablet TAKE 1 TABLET DAILY 90 tablet 2  . furosemide (LASIX) 40 MG tablet Take 1.5 tablets (60 mg total) by mouth daily. 135 tablet  1  . irbesartan (AVAPRO) 150 MG tablet Take 0.5 tablets (75 mg total) by mouth daily. 45 tablet 3  . isosorbide mononitrate (IMDUR) 120 MG 24 hr tablet TAKE 1 TABLET DAILY 90 tablet 2  . Multiple  Vitamins-Minerals (PRESERVISION AREDS 2) CAPS Take 1 capsule by mouth daily.     . nitroGLYCERIN (NITROLINGUAL) 0.4 MG/SPRAY spray Place 1 spray under the tongue every 5 (five) minutes x 3 doses as needed for chest pain. 25 g 3  . potassium chloride (KLOR-CON) 10 MEQ tablet TAKE 1 TABLET DAILY 90 tablet 3  . ranolazine (RANEXA) 1000 MG SR tablet TAKE 1 TABLET TWICE A DAY 180 tablet 3  . traZODone (DESYREL) 50 MG tablet Take 0.5-1 tablets (25-50 mg total) by mouth at bedtime as needed for sleep. 30 tablet 5   No current facility-administered medications for this visit.    Allergies:   Morphine and Ramipril    Social History:  The patient  reports that he has quit smoking. His smoking use included cigars. He quit after 29.00 years of use. He has never used smokeless tobacco. He reports previous alcohol use. He reports that he does not use drugs.   Family History:  The patient's family history includes Colon cancer in an other family member; Hypertension in his father, mother, and another family member.    ROS: Noted in current history, otherwise review of systems is negative.    Physical Exam: Blood pressure (!) 102/56, pulse 74, height 5\' 6"  (1.676 m), weight 162 lb (73.5 kg), SpO2 96 %.  GEN:  Elderly , frail male,  Appears weak  HEENT: Normal NECK: No JVD; No carotid bruits LYMPHATICS: No lymphadenopathy CARDIAC: RRR , no murmurs, rubs, gallops RESPIRATORY:  Clear to auscultation without rales, wheezing or rhonchi  ABDOMEN: Soft, non-tender, non-distended MUSCULOSKELETAL:  No edema; No deformity  SKIN: Warm and dry NEUROLOGIC:  Alert and oriented x 3  EKG:    October 04, 2020: Atrial pacing at 74.  He has a very prolonged a RR interval.  I discussed with Dr. Lovena Le about shortening his AV interval.  He suspects that this will produce a very wide QRS with a dyssynchronous LV conduction and does not think that this would be of any significant help in solving the issue of the  congestive heart failure.  Recent Labs: 12/27/2019: ALT 13 05/22/2020: Hemoglobin 10.6; NT-Pro BNP 5,067; Platelets 222 06/22/2020: BUN 41; Creatinine, Ser 1.63; Potassium 4.4; Sodium 135    Lipid Panel    Component Value Date/Time   CHOL 88 (L) 12/27/2019 1029   TRIG 117 12/27/2019 1029   HDL 35 (L) 12/27/2019 1029   CHOLHDL 2.5 12/27/2019 1029   CHOLHDL 4 12/24/2016 1103   VLDL 41.8 (H) 12/24/2016 1103   LDLCALC 32 12/27/2019 1029   LDLDIRECT 35.0 12/24/2016 1103      Wt Readings from Last 3 Encounters:  10/04/20 162 lb (73.5 kg)  07/11/20 155 lb 9.6 oz (70.6 kg)  05/22/20 147 lb (66.7 kg)      Current medicines are reviewed  The patient understands his medications.   ASSESSMENT AND PLAN:  1. Coronary artery disease-status post coronary artery bypass grafting he has only 1 of 3 grafts are patent-his LIMA to LAD.    Ive reviewed his heart catheterization from 2017.  His RCA is severely diseased.  He has an occluded saphenous vein grafts.  His LIMA is patent.  no angina    2. Hyperlipidemia -  3. Chronic systolic congestive heart failure -  EF is 35%.   Moderate MR.  Kinan continues to have problems with worsening congestive heart failure symptoms.  We will increase his furosemide to 60 mg p.o. twice daily as well as his potassium chloride to 10 mEq twice daily.  We will do this for 3 days and have him return to normal.  We probably will be able to do this on an occasional basis.  He has CKD so we will need to watch his creatinine closely.  We will have him return to see Scott in 4 to 6 weeks for follow-up visit as well as a basic metabolic profile.  He has a very prolonged paced a to our interval.  I discussed with Dr. Lovena Le the possibility of shortening this AV interval so that he has some diastolic But Dr. Lovena Le pointed out that he would probably have a very wide QRS with subsequent LV dyssynchrony and so in the long run this probably would not help.   4. ICD -     5. Obstructive sleep apnea -   Mertie Moores, MD  10/04/2020 4:48 PM    [address],  [place], [place]  [address] Pager [phone] Phone: [phone]; Fax: [phone]

## 2020-10-04 NOTE — Patient Instructions (Signed)
Medication Instructions:  1) TAKE Furosemide 60mg  twice daily and KDUR 10 meq twice daily for 3 days, then return to normal dosing.    *If you need a refill on your cardiac medications before your next appointment, please call your pharmacy*   Lab Work: BMET today  If you have labs (blood work) drawn today and your tests are completely normal, you will receive your results only by: Marland Kitchen MyChart Message (if you have MyChart) OR . A paper copy in the mail If you have any lab test that is abnormal or we need to change your treatment, we will call you to review the results.   Testing/Procedures: Your physician has requested that you have an echocardiogram. Echocardiography is a painless test that uses sound waves to create images of your heart. It provides your doctor with information about the size and shape of your heart and how well your heart's chambers and valves are working. This procedure takes approximately one hour. There are no restrictions for this procedure.   Follow-Up: At [facility], you and your health needs are our priority.  As part of our continuing mission to provide you with exceptional heart care, we have created designated Provider Care Teams.  These Care Teams include your primary Cardiologist (physician) and Advanced Practice Providers (APPs -  Physician Assistants and Nurse Practitioners) who all work together to provide you with the care you need, when you need it.  We recommend signing up for the patient portal called "MyChart".  Sign up information is provided on this After Visit Summary.  MyChart is used to connect with patients for Virtual Visits (Telemedicine).  Patients are able to view lab/test results, encounter notes, upcoming appointments, etc.  Non-urgent messages can be sent to your provider as well.   To learn more about what you can do with MyChart, go to [email or web address].    Your next appointment:   4-6 week(s)  The format for your next  appointment:   In Person  Provider:   Richardson Dopp, PA-C   Other Instructions

## 2020-10-05 ENCOUNTER — Ambulatory Visit: Payer: Medicare Other | Admitting: Physician Assistant

## 2020-10-05 LAB — BASIC METABOLIC PANEL
BUN/Creatinine Ratio: 24 (ref 10–24)
BUN: 45 mg/dL — ABNORMAL HIGH (ref 8–27)
CO2: 23 mmol/L (ref 20–29)
Calcium: 8.8 mg/dL (ref 8.6–10.2)
Chloride: 103 mmol/L (ref 96–106)
Creatinine, Ser: 1.85 mg/dL — ABNORMAL HIGH (ref 0.76–1.27)
GFR calc Af Amer: 37 mL/min/{1.73_m2} — ABNORMAL LOW (ref >59)
GFR calc non Af Amer: 32 mL/min/{1.73_m2} — ABNORMAL LOW (ref >59)
Glucose: 97 mg/dL (ref 65–99)
Potassium: 4.7 mmol/L (ref 3.5–5.2)
Sodium: 140 mmol/L (ref 134–144)

## 2020-10-19 ENCOUNTER — Telehealth: Payer: Medicare Other | Admitting: Cardiology

## 2020-10-26 ENCOUNTER — Ambulatory Visit (HOSPITAL_COMMUNITY): Payer: Medicare Other | Attending: Cardiology

## 2020-10-26 ENCOUNTER — Other Ambulatory Visit: Payer: Self-pay

## 2020-10-26 DIAGNOSIS — I5022 Chronic systolic (congestive) heart failure: Secondary | ICD-10-CM | POA: Diagnosis not present

## 2020-10-26 MED ORDER — PERFLUTREN LIPID MICROSPHERE
1.0000 mL | INTRAVENOUS | Status: AC | PRN
  Administered 2020-10-26 (×2): 1 mL via INTRAVENOUS

## 2020-10-27 LAB — ECHOCARDIOGRAM COMPLETE
AR max vel: 2.16 cm2
AV Area VTI: 2.45 cm2
AV Area mean vel: 2.11 cm2
AV Mean grad: 5 mmHg
AV Peak grad: 8.6 mmHg
Ao pk vel: 1.47 m/s
Area-P 1/2: 4.1 cm2
MV M vel: 3.92 m/s
MV Peak grad: 61.5 mmHg
Radius: 0.7 cm
S' Lateral: 4 cm

## 2020-10-29 ENCOUNTER — Ambulatory Visit (INDEPENDENT_AMBULATORY_CARE_PROVIDER_SITE_OTHER): Payer: Medicare Other

## 2020-10-29 DIAGNOSIS — I5022 Chronic systolic (congestive) heart failure: Secondary | ICD-10-CM | POA: Diagnosis not present

## 2020-10-29 DIAGNOSIS — I255 Ischemic cardiomyopathy: Secondary | ICD-10-CM

## 2020-10-29 LAB — CUP PACEART REMOTE DEVICE CHECK
Battery Remaining Longevity: 25 mo
Battery Remaining Percentage: 31 %
Battery Voltage: 2.84 V
Brady Statistic AP VP Percent: 31 %
Brady Statistic AP VS Percent: 68 %
Brady Statistic AS VP Percent: 1 %
Brady Statistic AS VS Percent: 1.7 %
Brady Statistic RA Percent Paced: 97 %
Brady Statistic RV Percent Paced: 31 %
Date Time Interrogation Session: 20220214020017
HighPow Impedance: 66 Ohm
HighPow Impedance: 66 Ohm
Implantable Lead Implant Date: 20080711
Implantable Lead Implant Date: 20080711
Implantable Lead Location: 753859
Implantable Lead Location: 753860
Implantable Lead Model: 7122
Implantable Pulse Generator Implant Date: 20151007
Lead Channel Impedance Value: 330 Ohm
Lead Channel Impedance Value: 380 Ohm
Lead Channel Pacing Threshold Amplitude: 0.5 V
Lead Channel Pacing Threshold Amplitude: 0.75 V
Lead Channel Pacing Threshold Pulse Width: 0.5 ms
Lead Channel Pacing Threshold Pulse Width: 0.5 ms
Lead Channel Sensing Intrinsic Amplitude: 2.1 mV
Lead Channel Sensing Intrinsic Amplitude: 9.5 mV
Lead Channel Setting Pacing Amplitude: 1.75 V
Lead Channel Setting Pacing Amplitude: 2.5 V
Lead Channel Setting Pacing Pulse Width: 0.5 ms
Lead Channel Setting Sensing Sensitivity: 0.5 mV
Pulse Gen Serial Number: 7225183

## 2020-10-31 ENCOUNTER — Other Ambulatory Visit: Payer: Self-pay

## 2020-10-31 ENCOUNTER — Ambulatory Visit (INDEPENDENT_AMBULATORY_CARE_PROVIDER_SITE_OTHER): Payer: Medicare Other | Admitting: Internal Medicine

## 2020-10-31 ENCOUNTER — Encounter: Payer: Self-pay | Admitting: Internal Medicine

## 2020-10-31 DIAGNOSIS — R112 Nausea with vomiting, unspecified: Secondary | ICD-10-CM | POA: Diagnosis not present

## 2020-10-31 DIAGNOSIS — I25119 Atherosclerotic heart disease of native coronary artery with unspecified angina pectoris: Secondary | ICD-10-CM | POA: Diagnosis not present

## 2020-10-31 DIAGNOSIS — R109 Unspecified abdominal pain: Secondary | ICD-10-CM

## 2020-10-31 LAB — CBC WITH DIFFERENTIAL/PLATELET
Basophils Absolute: 0.1 10*3/uL (ref 0.0–0.1)
Basophils Relative: 1 % (ref 0.0–3.0)
Eosinophils Absolute: 0.1 10*3/uL (ref 0.0–0.7)
Eosinophils Relative: 2.4 % (ref 0.0–5.0)
HCT: 28.9 % — ABNORMAL LOW (ref 39.0–52.0)
Hemoglobin: 9.8 g/dL — ABNORMAL LOW (ref 13.0–17.0)
Lymphocytes Relative: 10.1 % — ABNORMAL LOW (ref 12.0–46.0)
Lymphs Abs: 0.6 10*3/uL — ABNORMAL LOW (ref 0.7–4.0)
MCHC: 33.8 g/dL (ref 30.0–36.0)
MCV: 84.3 fl (ref 78.0–100.0)
Monocytes Absolute: 0.6 10*3/uL (ref 0.1–1.0)
Monocytes Relative: 10.7 % (ref 3.0–12.0)
Neutro Abs: 4.4 10*3/uL (ref 1.4–7.7)
Neutrophils Relative %: 75.8 % (ref 43.0–77.0)
Platelets: 205 10*3/uL (ref 150.0–400.0)
RBC: 3.43 Mil/uL — ABNORMAL LOW (ref 4.22–5.81)
RDW: 17.2 % — ABNORMAL HIGH (ref 11.5–15.5)
WBC: 5.8 10*3/uL (ref 4.0–10.5)

## 2020-10-31 LAB — COMPREHENSIVE METABOLIC PANEL
ALT: 12 U/L (ref 0–53)
AST: 12 U/L (ref 0–37)
Albumin: 4.1 g/dL (ref 3.5–5.2)
Alkaline Phosphatase: 123 U/L — ABNORMAL HIGH (ref 39–117)
BUN: 44 mg/dL — ABNORMAL HIGH (ref 6–23)
CO2: 25 mEq/L (ref 19–32)
Calcium: 8.9 mg/dL (ref 8.4–10.5)
Chloride: 96 mEq/L (ref 96–112)
Creatinine, Ser: 2 mg/dL — ABNORMAL HIGH (ref 0.40–1.50)
GFR: 29.12 mL/min — ABNORMAL LOW (ref >60.00)
Glucose, Bld: 105 mg/dL — ABNORMAL HIGH (ref 70–99)
Potassium: 4.5 mEq/L (ref 3.5–5.1)
Sodium: 132 mEq/L — ABNORMAL LOW (ref 135–145)
Total Bilirubin: 1 mg/dL (ref 0.2–1.2)
Total Protein: 6.7 g/dL (ref 6.0–8.3)

## 2020-10-31 LAB — URINALYSIS
Bilirubin Urine: NEGATIVE
Hgb urine dipstick: NEGATIVE
Ketones, ur: NEGATIVE
Leukocytes,Ua: NEGATIVE
Nitrite: NEGATIVE
Specific Gravity, Urine: <=1.005 — AB (ref 1.000–1.030)
Total Protein, Urine: NEGATIVE
Urine Glucose: NEGATIVE
Urobilinogen, UA: 0.2 (ref 0.0–1.0)
pH: 5.5 (ref 5.0–8.0)

## 2020-10-31 LAB — LIPASE: Lipase: 22 U/L (ref 11.0–59.0)

## 2020-10-31 MED ORDER — ONDANSETRON HCL 4 MG PO TABS: 4.0000 mg | ORAL_TABLET | Freq: Three times a day (TID) | ORAL | 0 refills | Status: AC | PRN

## 2020-10-31 MED ORDER — ACETAMINOPHEN-CODEINE 300-30 MG PO TABS: 0.5000 | ORAL_TABLET | ORAL | 1 refills | Status: DC | PRN

## 2020-10-31 NOTE — Assessment & Plan Note (Addendum)
Severe left flank pain. R/o UTI, other etiology UA, LFT, CBC Zofran Rx

## 2020-10-31 NOTE — Assessment & Plan Note (Addendum)
Severe left flank pain. R/o UTI, other etiology UA, LFT, CBC Tylenol #3 Rx given  Potential benefits of a short term opioids use as well as potential risks (i.e. addiction risk, apnea etc) and complications (i.e. Somnolence, constipation and others) were explained to the patient and were aknowledged.

## 2020-10-31 NOTE — Assessment & Plan Note (Signed)
No angina lately

## 2020-10-31 NOTE — Progress Notes (Signed)
Subjective:  Patient ID: [name] Curia., male    DOB: 12/24/1928  Age: 87 y.o. MRN: [id number]  CC: Back Pain (And Leg pain)   HPI [name] Rho. presents for LBP, worse on the R F/u CAD, CRF, spasms since  Sunday. No injury. [name] [name] tried Tylenol, patches.  C/o nausea. [name] [name] vomited yesterday in am. Pain was 9/10; pain is better now.  Outpatient Medications Prior to Visit  Medication Sig Dispense Refill  . amLODipine (NORVASC) 2.5 MG tablet TAKE 1 TABLET BY MOUTH EVERY DAY 90 tablet 3  . atorvastatin (LIPITOR) 40 MG tablet TAKE 1 TABLET DAILY 90 tablet 3  . carvedilol (COREG) 12.5 MG tablet TAKE 1 TABLET TWICE A DAY 180 tablet 3  . Cholecalciferol 1000 UNITS tablet Take 1,000 Units by mouth daily.    . clopidogrel (PLAVIX) 75 MG tablet TAKE 1 TABLET DAILY 90 tablet 2  . furosemide (LASIX) 40 MG tablet Take 1.5 tablets (60 mg total) by mouth daily. 135 tablet 1  . irbesartan (AVAPRO) 150 MG tablet Take 0.5 tablets (75 mg total) by mouth daily. 45 tablet 3  . isosorbide mononitrate (IMDUR) 120 MG 24 hr tablet TAKE 1 TABLET DAILY 90 tablet 2  . Multiple Vitamins-Minerals (PRESERVISION AREDS 2) CAPS Take 1 capsule by mouth daily.     . nitroGLYCERIN (NITROLINGUAL) 0.4 MG/SPRAY spray Place 1 spray under the tongue every 5 (five) minutes x 3 doses as needed for chest pain. 25 g 3  . potassium chloride (KLOR-CON) 10 MEQ tablet TAKE 1 TABLET DAILY 90 tablet 3  . ranolazine (RANEXA) 1000 MG SR tablet TAKE 1 TABLET TWICE A DAY 180 tablet 3  . traZODone (DESYREL) 50 MG tablet Take 0.5-1 tablets (25-50 mg total) by mouth at bedtime as needed for sleep. 30 tablet 5   No facility-administered medications prior to visit.    ROS: Review of Systems  Constitutional: Negative for appetite change, chills, fatigue and unexpected weight change.  HENT: Negative for congestion, nosebleeds, sneezing, sore throat and trouble swallowing.   Eyes: Negative for itching and visual disturbance.  Respiratory:  Negative for cough.   Cardiovascular: Negative for chest pain, palpitations and leg swelling.  Gastrointestinal: Positive for nausea and vomiting. Negative for abdominal distention, abdominal pain, blood in stool and diarrhea.  Genitourinary: Positive for frequency. Negative for hematuria.  Musculoskeletal: Positive for back pain. Negative for gait problem, joint swelling and neck pain.  Skin: Negative for rash.  Neurological: Negative for dizziness, tremors, speech difficulty and weakness.  Psychiatric/Behavioral: Negative for agitation, dysphoric mood, sleep disturbance and suicidal ideas. The patient is not nervous/anxious.     Objective:  BP 120/70 (BP Location: Left Arm)   Pulse 72   Temp 97.7 F (36.5 C) (Oral)   Ht 5\' 6"  (1.676 m)   Wt 160 lb 9.6 oz (72.8 kg)   SpO2 97%   BMI 25.92 kg/m   BP Readings from Last 3 Encounters:  10/31/20 120/70  10/04/20 (!) 102/56  07/11/20 98/62    Wt Readings from Last 3 Encounters:  10/31/20 160 lb 9.6 oz (72.8 kg)  10/04/20 162 lb (73.5 kg)  07/11/20 155 lb 9.6 oz (70.6 kg)    Physical Exam Constitutional:      General: [name] [name] is not in acute distress.    Appearance: Normal appearance. [name] [name] is well-developed. [name] [name] is not ill-appearing or toxic-appearing.     Comments: NAD  HENT:     Mouth/Throat:     Mouth: Oropharynx  is clear and moist.  Eyes:     Conjunctiva/sclera: Conjunctivae normal.     Pupils: Pupils are equal, round, and reactive to light.  Neck:     Thyroid: No thyromegaly.     Vascular: No JVD.  Cardiovascular:     Rate and Rhythm: Normal rate and regular rhythm.     Pulses: Intact distal pulses.     Heart sounds: Normal heart sounds. No murmur heard. No friction rub. No gallop.   Pulmonary:     Effort: Pulmonary effort is normal. No respiratory distress.     Breath sounds: Normal breath sounds. No wheezing or rales.  Chest:     Chest wall: No tenderness.  Abdominal:     General: Bowel sounds are normal. There is no  distension.     Palpations: Abdomen is soft. There is no mass.     Tenderness: There is no abdominal tenderness. There is no guarding or rebound.  Musculoskeletal:        General: No tenderness or edema. Normal range of motion.     Cervical back: Normal range of motion.  Lymphadenopathy:     Cervical: No cervical adenopathy.  Skin:    General: Skin is warm and dry.     Findings: No rash.  Neurological:     Mental Status: [name] [name] is alert and oriented to person, place, and time.     Cranial Nerves: No cranial nerve deficit.     Motor: No abnormal muscle tone.     Coordination: [name] [name] displays a negative Romberg sign. Coordination normal.     Gait: Gait abnormal.     Deep Tendon Reflexes: Reflexes are normal and symmetric.  Psychiatric:        Mood and Affect: Mood and affect normal.        Behavior: Behavior normal.        Thought Content: Thought content normal.        Judgment: Judgment normal.     Lab Results  Component Value Date   WBC 6.5 05/22/2020   HGB 10.6 (L) 05/22/2020   HCT 30.2 (L) 05/22/2020   PLT 222 05/22/2020   GLUCOSE 97 10/04/2020   CHOL 88 (L) 12/27/2019   TRIG 117 12/27/2019   HDL 35 (L) 12/27/2019   LDLDIRECT 35.0 12/24/2016   LDLCALC 32 12/27/2019   ALT 13 12/27/2019   AST 13 12/27/2019   NA 140 10/04/2020   K 4.7 10/04/2020   CL 103 10/04/2020   CREATININE 1.85 (H) 10/04/2020   BUN 45 (H) 10/04/2020   CO2 23 10/04/2020   TSH 3.71 12/24/2016   PSA 3.11 12/24/2016   INR 1.0 03/25/2016   HGBA1C 6.4 12/24/2016    DG Hand Complete Left  Result Date: 07/16/2019 CLINICAL DATA:  Animal bite. EXAM: LEFT HAND - COMPLETE 3+ VIEW COMPARISON:  None. FINDINGS: There is soft tissue swelling about the hand, most notably the fourth digit. There is no radiopaque foreign body. No evidence for displaced fracture or dislocation. There is no radiographic evidence for osteomyelitis. There are advanced degenerative changes at the first carpometacarpal joint. Vascular  calcifications are noted. There are degenerative changes of the interphalangeal joints. IMPRESSION: 1. No acute displaced fracture or dislocation. 2. Nonspecific soft tissue swelling about the hand. 3. Degenerative changes as above. Electronically Signed   By: Constance Holster M.D.   On: 07/16/2019 20:32    Assessment & Plan:    Follow-up: No follow-ups on file.  Walker Kehr, MD

## 2020-11-01 ENCOUNTER — Telehealth: Payer: Self-pay | Admitting: *Deleted

## 2020-11-01 ENCOUNTER — Ambulatory Visit
Admission: RE | Admit: 2020-11-01 | Discharge: 2020-11-01 | Disposition: A | Discharge status: Home / Self Care | Payer: Medicare Other | Source: Ambulatory Visit | Attending: Internal Medicine | Admitting: Internal Medicine

## 2020-11-01 ENCOUNTER — Ambulatory Visit: Payer: Medicare Other | Admitting: Internal Medicine

## 2020-11-01 ENCOUNTER — Ambulatory Visit (INDEPENDENT_AMBULATORY_CARE_PROVIDER_SITE_OTHER): Payer: Medicare Other | Admitting: Cardiology

## 2020-11-01 ENCOUNTER — Encounter: Payer: Self-pay | Admitting: Cardiology

## 2020-11-01 VITALS — BP 91/56 | HR 73 | Ht 67.0 in | Wt 156.0 lb

## 2020-11-01 DIAGNOSIS — I10 Essential (primary) hypertension: Secondary | ICD-10-CM | POA: Diagnosis not present

## 2020-11-01 DIAGNOSIS — R109 Unspecified abdominal pain: Secondary | ICD-10-CM

## 2020-11-01 DIAGNOSIS — G4733 Obstructive sleep apnea (adult) (pediatric): Secondary | ICD-10-CM

## 2020-11-01 DIAGNOSIS — F5101 Primary insomnia: Secondary | ICD-10-CM | POA: Diagnosis not present

## 2020-11-01 DIAGNOSIS — R112 Nausea with vomiting, unspecified: Secondary | ICD-10-CM

## 2020-11-01 NOTE — Telephone Encounter (Signed)
-----   Message from Sueanne Margarita, MD sent at 11/01/2020 11:29 AM EST ----- change patient to auto BiPAP with IPAP max 20cm H2O, EPAP min 6cm H2O and PS of 5cm H2O -check download in 2 weeks

## 2020-11-01 NOTE — Progress Notes (Signed)
Date:  11/01/2020   ID:  [name] [name]., DOB [birth year], MRN [id number]  PCP:  Cassandria Anger, MD  Cardiologist:  Mertie Moores, MD  Sleep Medicine:  Fransico Him, MD Electrophysiologist:  Virl Axe, MD   Chief Complaint:  OSA  History of Present Illness:    [name] [name]. is a 87 y.o. male with a hx of severe OSA with an AHI of 38.5/hr and is now on BiPAP.   He is doing well with his CPAP device and thinks that he has gotten used to it.  He tolerates the mask and feels the pressure is adequate.  He thinks that the mask leaks some.  Since going on CPAP he feels rested in the am and has no significant daytime sleepiness.  He occasionally has some dry mouth but no nasal congestion.   He does not think that he snores.     Prior CV studies:   The following studies were reviewed today:  PAP compliance download  Past Medical History:  Diagnosis Date  . AICD (automatic cardioverter/defibrillator) present   . Arrhythmia   . Arthritis    "hands, legs" (03/26/2016)  . 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 Chronic systolic CHF (congestive heart failure) ([place])    a. Echo 9/14: Mild LVH, mild focal basal septal hypertrophy, EF 35-40%, anteroseptal HK, normal diastolic function, mild MR, mild LAE, PASP 34 mmHg  . Chronotropic incompetence    treated w/pacemaker, ICD 7.2008. This was placed after CPX done post 2008 cath. CPX showed only chronotropic incompetence  . History of blood transfusion 1969   "after getting MSO4; it destroys my corpuscles" (03/26/2016)  . History of colon polyps   . Hyperlipidemia   . Hypertension   . Migraine    "just before my bypass" (03/26/2016)  . Mitral  regurgitation    mild  . Myocardial infarction ([place]) "several"  . Numbness and tingling of left arm and leg    improved after tx w/prednisone  . OSA (obstructive sleep apnea) 09/01/2016   Severe OSA with AHI 38/hr now on BiPAP at 21/17cm H2O  . Syncopal episodes    relative hypotension   Past Surgical History:  Procedure Laterality Date  . BACK SURGERY    . CARDIAC CATHETERIZATION  1980s-1990s X 4  . CARDIAC CATHETERIZATION N/A 03/26/2016   Procedure: Left Heart Cath and Cors/Grafts Angiography;  Surgeon: Burnell Blanks, MD;  Location: [facility];  Service: Cardiovascular;  Laterality: N/A;  . CARDIAC CATHETERIZATION N/A 03/26/2016   Procedure: Coronary Stent Intervention;  Surgeon: Burnell Blanks, MD;  Location: [facility];  Service: Cardiovascular;  Laterality: N/A;  . CARDIAC DEFIBRILLATOR PLACEMENT  2008  . CATARACT EXTRACTION W/ INTRAOCULAR LENS  IMPLANT, BILATERAL Bilateral 08/2013 - 09/2013   right - left   . CORONARY ANGIOPLASTY WITH STENT PLACEMENT  ~ 1992; 03/26/2016   "1 + 2"  . CORONARY ARTERY BYPASS GRAFT  1992   CABG "X4"  . CYSTOSCOPY  1980s  . GAS/FLUID EXCHANGE Right 06/18/2017   Procedure: GAS/FLUID EXCHANGE (C3F8);  Surgeon: Sherlynn Stalls, MD;  Location: [facility];  Service: Ophthalmology;  Laterality: Right;  . IMPLANTABLE CARDIOVERTER DEFIBRILLATOR (ICD) GENERATOR CHANGE N/A 06/21/2014   Procedure: ICD GENERATOR CHANGE;  Surgeon: Deboraha Sprang, MD;  Location: [facility];  Service: Cardiovascular;  Laterality: N/A;  . PARS PLANA VITRECTOMY Right 06/18/2017   Procedure: PARS PLANA VITRECTOMY WITH 25 GAUGE;  Surgeon: Sherlynn Stalls, MD;  Location: [facility];  Service: Ophthalmology;  Laterality: Right;  . PHOTOCOAGULATION WITH LASER Right 06/18/2017   Procedure: PHOTOCOAGULATION WITH LASER- ENDO LASER;  Surgeon: Sherlynn Stalls, MD;  Location: [facility];  Service: Ophthalmology;  Laterality: Right;  . POSTERIOR LUMBAR FUSION  ~ 1989   "fused 2 discs"   . TIBIA FRACTURE SURGERY Left 1943   "put artificial bone in there"     Current Meds  Medication Sig  . Acetaminophen-Codeine (TYLENOL/CODEINE #3) 300-30 MG tablet Take 0.5-1 tablets by mouth every 4 (four) hours as needed for pain.  Marland Kitchen amLODipine (NORVASC) 2.5 MG tablet TAKE 1 TABLET BY MOUTH EVERY DAY  . atorvastatin (LIPITOR) 40 MG tablet TAKE 1 TABLET DAILY  . carvedilol (COREG) 12.5 MG tablet TAKE 1 TABLET TWICE A DAY  . Cholecalciferol 1000 UNITS tablet Take 1,000 Units by mouth daily.  . clopidogrel (PLAVIX) 75 MG tablet TAKE 1 TABLET DAILY  . furosemide (LASIX) 40 MG tablet Take 1.5 tablets (60 mg total) by mouth daily.  . irbesartan (AVAPRO) 150 MG tablet Take 0.5 tablets (75 mg total) by mouth daily.  . isosorbide mononitrate (IMDUR) 120 MG 24 hr tablet TAKE 1 TABLET DAILY  . Multiple Vitamins-Minerals (PRESERVISION AREDS 2) CAPS Take 1 capsule by mouth daily.   . nitroGLYCERIN (NITROLINGUAL) 0.4 MG/SPRAY spray Place 1 spray under the tongue every 5 (five) minutes x 3 doses as needed for chest pain.  Marland Kitchen ondansetron (ZOFRAN) 4 MG tablet Take 1 tablet (4 mg total) by mouth every 8 (eight) hours as needed for nausea or vomiting.  . potassium chloride (KLOR-CON) 10 MEQ tablet TAKE 1 TABLET DAILY  . ranolazine (RANEXA) 1000 MG SR tablet TAKE 1 TABLET TWICE A DAY  . traZODone (DESYREL) 50 MG tablet Take 0.5-1 tablets (25-50 mg total) by mouth at bedtime as needed for sleep.     Allergies:   Morphine and Ramipril   Social History   Tobacco Use  . Smoking status: Former Smoker    Years: 29.00    Types: Cigars  . Smokeless tobacco: Never Used  . Tobacco comment: quit smoking cigars in 1973  Vaping Use  . Vaping Use: Never used  Substance Use Topics  . Alcohol use: Not Currently    Comment: "quit drinking in 1973"  . Drug use: No     Family Hx: The patient's family history includes Colon cancer in an other family member; Hypertension in his father, mother, and another family  member.  ROS:   Please see the history of present illness.     All other systems reviewed and are negative.   Labs/Other Tests and Data Reviewed:    Recent Labs: 05/22/2020: NT-Pro BNP 5,067 10/31/2020: ALT 12; BUN 44; Creatinine, Ser 2.00; Hemoglobin 9.8; Platelets 205.0; Potassium 4.5; Sodium 132   Recent Lipid Panel Lab Results  Component Value Date/Time   CHOL 88 (L) 12/27/2019 10:29 AM   TRIG 117 12/27/2019 10:29 AM   HDL 35 (L) 12/27/2019 10:29 AM   CHOLHDL 2.5 12/27/2019 10:29 AM   CHOLHDL 4 12/24/2016 11:03 AM   LDLCALC 32 12/27/2019 10:29 AM   LDLDIRECT 35.0 12/24/2016 11:03 AM  Wt Readings from Last 3 Encounters:  11/01/20 156 lb (70.8 kg)  10/31/20 160 lb 9.6 oz (72.8 kg)  10/04/20 162 lb (73.5 kg)     Objective:    Vital Signs:  BP (!) 91/56   Pulse 73   Ht 5\' 7"  (1.702 m)   Wt 156 lb (70.8 kg)   SpO2 99%   BMI 24.43 kg/m    GEN: Well nourished, well developed in no acute distress HEENT: Normal NECK: No JVD; No carotid bruits LYMPHATICS: No lymphadenopathy CARDIAC:RRR, no murmurs, rubs, gallops RESPIRATORY:  Clear to auscultation without rales, wheezing or rhonchi  ABDOMEN: Soft, non-tender, non-distended MUSCULOSKELETAL:  No edema; No deformity  SKIN: Warm and dry NEUROLOGIC:  Alert and oriented x 3 PSYCHIATRIC:  Normal affect    ASSESSMENT & PLAN:    1.  OSA - The patient is tolerating PAP therapy well without any problems. The PAP download was reviewed today and showed an AHI of 15.4/hr on 18/14 cm H2O with 90% compliance in using more than 4 hours nightly.  The patient has been using and benefiting from PAP use and will continue to benefit from therapy.  -AHI is too high so I will change him to auto BiPAP with IPAP max 20cm H2O, EPAP min 6cm H2O and PS of 5cm H2O -check download in 2 weeks -I have encouraged him to change out his cushion monthly because he has a large mask leak -if AHI is still high after above changes then I will get him an  appt with DME for mask check  2.  Insomnia -likely related to too much caffeine before bed -I encouraged him to only drink caffeine once in the am and not during the day -given his advanced age I explained to him that it would not be safe to given him a sleep aide -his PCP started him on Trazadone  3.  HTN -his BP is well controlled on exam today and actually on the soft side -continue Carvedilol 12.5mg  BID and Irbesartan 75mg  daily.   Medication Adjustments/Labs and Tests Ordered: Current medicines are reviewed at length with the patient today.  Concerns regarding medicines are outlined above.  Tests Ordered: No orders of the defined types were placed in this encounter.  Medication Changes: No orders of the defined types were placed in this encounter.   Disposition:  Follow up in 1 year(s)  Signed, Fransico Him, MD  11/01/2020 11:21 AM    [facility]

## 2020-11-01 NOTE — Patient Instructions (Signed)

## 2020-11-01 NOTE — Telephone Encounter (Signed)
Order placed to [facility] via community message. 

## 2020-11-02 ENCOUNTER — Ambulatory Visit
Admission: RE | Admit: 2020-11-02 | Discharge: 2020-11-02 | Disposition: A | Discharge status: Home / Self Care | Payer: Medicare Other | Source: Ambulatory Visit | Attending: Internal Medicine | Admitting: Internal Medicine

## 2020-11-02 DIAGNOSIS — R109 Unspecified abdominal pain: Secondary | ICD-10-CM | POA: Diagnosis not present

## 2020-11-02 DIAGNOSIS — R112 Nausea with vomiting, unspecified: Secondary | ICD-10-CM | POA: Diagnosis not present

## 2020-11-05 ENCOUNTER — Other Ambulatory Visit: Payer: Self-pay | Admitting: Internal Medicine

## 2020-11-05 ENCOUNTER — Telehealth: Payer: Self-pay | Admitting: Cardiovascular Disease

## 2020-11-05 DIAGNOSIS — K8012 Calculus of gallbladder with acute and chronic cholecystitis without obstruction: Secondary | ICD-10-CM

## 2020-11-05 MED ORDER — AMOXICILLIN-POT CLAVULANATE 875-125 MG PO TABS: 1.0000 | ORAL_TABLET | Freq: Two times a day (BID) | ORAL | 0 refills | Status: DC

## 2020-11-05 NOTE — Progress Notes (Signed)
Remote ICD transmission.   

## 2020-11-05 NOTE — Telephone Encounter (Addendum)
Called patient's wife (DPR) back about patient's low BP.  Patient BP have been running low last night and today. Patient's wife stated patient has not been eating a lot and has been feeling weak and lightheaded. Patient's last couple of BP at the office were on the low side. Patient's wife stated that patient is not too concerned about it and refuses to go to the ED. Patient's wife stated patient did have a good lunch though. Informed patient's wife to make sure patient is staying hydrated. Encouraged her to give patient a small salty snack and some fluids if his BP is low again. Encouraged her to check patient's BP in the morning and hold his amlodipine and irbesartan if BP is low. Informed her she could bring medications to his appointment and if his BP comes up he can take it then. Scheduled patient tomorrow morning with Truitt Merle NP at the [facility] office. Consulted, DOD, Dr. Curt Bears who agreed to plan.

## 2020-11-05 NOTE — Telephone Encounter (Signed)
Pt c/o BP issue: STAT if pt c/o blurred vision, one-sided weakness or slurred speech  1. What are your last 5 BP readings?  Last night 79/48, 74/48 72/43  This morning  It was 81/44 84/48 at 11:00 and just a few minutes ago 87/51 and now it is 8549  2. Are you having any other symptoms (ex. Dizziness, headache, blurred vision, passed out)? Lightheaded, dizzy and real weak   3. What is your BP issue? Blood pressure running low- pt wants to be seen asap this week- does not want to go to the ER*

## 2020-11-05 NOTE — Progress Notes (Signed)
CARDIOLOGY OFFICE NOTE  Date:  11/06/2020    [name] [name]. Date of Birth: 09-29-1928 Medical Record #[id number]  PCP:  Cassandria Anger, MD  Cardiologist:  Cathie [facility]en [facility]    Chief Complaint  Patient presents with  . Follow-up    Work in visit for low BP - seen for Dr. Acie Fredrickson    History of Present Illness: [name] [name]. is a 87 y.o. male who presents today for a follow up visit. Seen for Dr. Acie Fredrickson & Radford Pax.   He has a history of severe OSA - on BiPAP. Other issues include CAD with remote CABG - had 2 DES stents to the ramus intermediate in 2017 - last cath in 2017 showing patent LIMA to dLAD, patent LCX, occluded RCA and occluded SVGs. Other issues include CKD, HLD, chronic systolic HF with EF of 35 to 40%, and underlying ICD in place. [place]. His last cath was in 2017 and he has continued to be managed medically.  Last seen by Dr. Acie Fredrickson last month - more chest pain/pressure and worsening DOE. Echo was updated and basically unchanged.   Comes in today. Here with his wife. He has had his medicines today. BP is low - readings from home show readings in the 70 to 53'Z systolic. They had not really been checking his BP until just recently - saw his PCP last week - having right flank pain - given Tylenol with codeine - got very constipated - then nauseated and vomiting - she gave him a fleets enema and a stool softener and he got relief. But now with several days of persistent weakness. He had an ultrasound of his belly - suspicious for a gall stone but nothing to suggest acute cholecystitis - CBG was at the upper limits of normal - no ductal dilatation. Now just feels weak. No chest pain noted. Breathing short with exertion - this seems to be getting worse as well. She is worried about him. Labs last week with PCP noted - little more anemic - has CKD - LFTs basically ok, lipase normal.   Past Medical History:  Diagnosis Date  . AICD (automatic  cardioverter/defibrillator) present   . Arrhythmia   . Arthritis    "hands, legs" (03/26/2016)  . 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 Chronic systolic CHF (congestive heart failure) ([place])    a. Echo 9/14: Mild LVH, mild focal basal septal hypertrophy, EF 35-40%, anteroseptal HK, normal diastolic function, mild MR, mild LAE, PASP 34 mmHg  . Chronotropic incompetence    treated w/pacemaker, ICD 7.2008. This was placed after CPX done post 2008 cath. CPX showed only chronotropic incompetence  . History of blood transfusion 1969   "after getting MSO4; it destroys my corpuscles" (03/26/2016)  . History of colon polyps   . Hyperlipidemia   . Hypertension   . Migraine    "just before my bypass" (03/26/2016)  . Mitral regurgitation    mild  . Myocardial infarction ([place]) "several"  . Numbness and tingling of left arm and leg    improved after tx w/prednisone  . OSA (obstructive sleep apnea) 09/01/2016  Severe OSA with AHI 38/hr now on BiPAP at 21/17cm H2O  . Syncopal episodes    relative hypotension    Past Surgical History:  Procedure Laterality Date  . BACK SURGERY    . CARDIAC CATHETERIZATION  1980s-1990s X 4  . CARDIAC CATHETERIZATION N/A 03/26/2016   Procedure: Left Heart Cath and Cors/Grafts Angiography;  Surgeon: Burnell Blanks, MD;  Location: [facility];  Service: Cardiovascular;  Laterality: N/A;  . CARDIAC CATHETERIZATION N/A 03/26/2016   Procedure: Coronary Stent Intervention;  Surgeon: Burnell Blanks, MD;  Location: [facility];  Service: Cardiovascular;  Laterality: N/A;  . CARDIAC DEFIBRILLATOR PLACEMENT  2008  . CATARACT EXTRACTION W/ INTRAOCULAR LENS  IMPLANT,  BILATERAL Bilateral 08/2013 - 09/2013   right - left   . CORONARY ANGIOPLASTY WITH STENT PLACEMENT  ~ 1992; 03/26/2016   "1 + 2"  . CORONARY ARTERY BYPASS GRAFT  1992   CABG "X4"  . CYSTOSCOPY  1980s  . GAS/FLUID EXCHANGE Right 06/18/2017   Procedure: GAS/FLUID EXCHANGE (C3F8);  Surgeon: Sherlynn Stalls, MD;  Location: [facility];  Service: Ophthalmology;  Laterality: Right;  . IMPLANTABLE CARDIOVERTER DEFIBRILLATOR (ICD) GENERATOR CHANGE N/A 06/21/2014   Procedure: ICD GENERATOR CHANGE;  Surgeon: Deboraha Sprang, MD;  [facility];  Service: Cardiovascular;  Laterality: N/A;  . PARS PLANA VITRECTOMY Right 06/18/2017   Procedure: PARS PLANA VITRECTOMY WITH 25 GAUGE;  Surgeon: Sherlynn Stalls, MD;  Location: [facility];  Service: Ophthalmology;  Laterality: Right;  . PHOTOCOAGULATION WITH LASER Right 06/18/2017   Procedure: PHOTOCOAGULATION WITH LASER- ENDO LASER;  Surgeon: Sherlynn Stalls, MD;  Location: [facility];  Service: Ophthalmology;  Laterality: Right;  . POSTERIOR LUMBAR FUSION  ~ 1989   "fused 2 discs"  . TIBIA FRACTURE SURGERY Left 1943   "put artificial bone in there"     Medications: Current Meds  Medication Sig  . Acetaminophen-Codeine (TYLENOL/CODEINE #3) 300-30 MG tablet Take 0.5-1 tablets by mouth every 4 (four) hours as needed for pain.  Marland Kitchen amLODipine (NORVASC) 2.5 MG tablet TAKE 1 TABLET BY MOUTH EVERY DAY  . amoxicillin-clavulanate (AUGMENTIN) 875-125 MG tablet Take 1 tablet by mouth 2 (two) times daily.  Marland Kitchen atorvastatin (LIPITOR) 40 MG tablet TAKE 1 TABLET DAILY  . carvedilol (COREG) 12.5 MG tablet TAKE 1 TABLET TWICE A DAY  . Cholecalciferol 1000 UNITS tablet Take 1,000 Units by mouth daily.  . clopidogrel (PLAVIX) 75 MG tablet TAKE 1 TABLET DAILY  . furosemide (LASIX) 40 MG tablet Take 1.5 tablets (60 mg total) by mouth daily.  . irbesartan (AVAPRO) 150 MG tablet Take 0.5 tablets (75 mg total) by mouth daily.  . isosorbide mononitrate (IMDUR) 120 MG 24 hr tablet TAKE 1 TABLET  DAILY  . Multiple Vitamins-Minerals (PRESERVISION AREDS 2) CAPS Take 1 capsule by mouth daily.   . nitroGLYCERIN (NITROLINGUAL) 0.4 MG/SPRAY spray Place 1 spray under the tongue every 5 (five) minutes x 3 doses as needed for chest pain.  Marland Kitchen ondansetron (ZOFRAN) 4 MG tablet Take 1 tablet (4 mg total) by mouth every 8 (eight) hours as needed for nausea or vomiting.  . potassium chloride (KLOR-CON) 10 MEQ tablet TAKE 1 TABLET DAILY  . ranolazine (RANEXA) 1000 MG SR tablet TAKE 1 TABLET TWICE A DAY  . traZODone (DESYREL) 50 MG tablet Take 0.5-1 tablets (25-50 mg total) by mouth at bedtime as needed for sleep.     Allergies: Allergies  Allergen Reactions  . Morphine     "increased  white corpuscles" Can take codeine  . Ramipril Other (See Comments)    REACTION: cough if dose is over 2.5mg     Social History: The patient  reports that he has quit smoking. His smoking use included cigars. He quit after 29.00 years of use. He has never used smokeless tobacco. He reports previous alcohol use. He reports that he does not use drugs.   Family History: The patient's family history includes Colon cancer in an other family member; Hypertension in his father, mother, and another family member.   Review of Systems: Please see the history of present illness.   All other systems are reviewed and negative.   Physical Exam: VS:  BP (!) 88/48   Pulse 75   Ht 5\' 7"  (1.702 m)   Wt 165 lb 6.4 oz (75 kg)   SpO2 98%   BMI 25.91 kg/m  .  BMI Body mass index is 25.91 kg/m.  Wt Readings from Last 3 Encounters:  11/06/20 165 lb 6.4 oz (75 kg)  11/01/20 156 lb (70.8 kg)  10/31/20 160 lb 9.6 oz (72.8 kg)    General: Pleasant. Elderly. Alert and in no acute distress.    Cardiac: Regular rate and rhythm. Heart tones are distant. ICD in the left upper chest.  No edema.  Respiratory:  Lungs are clear to auscultation bilaterally with normal work of breathing.  GI: Seems distended - he denies any tenderness.  Still with some right upper flank pain/tenderness noted.   MS: No deformity or atrophy. Gait and ROM intact.  Skin: Warm and dry. Color is normal.  Neuro:  Strength and sensation are intact and no gross focal deficits noted.  Psych: Alert, appropriate and with normal affect.   LABORATORY DATA:  EKG:  EKG is not ordered today.    Lab Results  Component Value Date   WBC 5.8 10/31/2020   HGB 9.8 (L) 10/31/2020   HCT 28.9 (L) 10/31/2020   PLT 205.0 10/31/2020   GLUCOSE 105 (H) 10/31/2020   CHOL 88 (L) 12/27/2019   TRIG 117 12/27/2019   HDL 35 (L) 12/27/2019   LDLDIRECT 35.0 12/24/2016   LDLCALC 32 12/27/2019   ALT 12 10/31/2020   AST 12 10/31/2020   NA 132 (L) 10/31/2020   K 4.5 10/31/2020   CL 96 10/31/2020   CREATININE 2.00 (H) 10/31/2020   BUN 44 (H) 10/31/2020   CO2 25 10/31/2020   TSH 3.71 12/24/2016   PSA 3.11 12/24/2016   INR 1.0 03/25/2016   HGBA1C 6.4 12/24/2016     BNP (last 3 results) No results for input(s): BNP in the last 8760 hours.  ProBNP (last 3 results) Recent Labs    05/22/20 1129  PROBNP 5,067*     Other Studies Reviewed Today:  US ABDOMEN IMPRESSION: 1. Suspicion of gallbladder contracted around a 9 mm stone in the neck. But no pericholecystic fluid or sonographic Murphy's sign to strongly suggest acute cholecystitis.  2. CBD is at the upper limits of normal to mildly enlarged, but there is no intrahepatic biliary ductal dilatation to corroborate acute bile duct obstruction.  Electronically Signed   By: Genevie Ann M.D.   On: 11/04/2020 08:26   ECHO IMPRESSIONS 10/2020  1. Left ventricular ejection fraction, by estimation, is 30 to 35%. The  left ventricle has moderately decreased function. The left ventricle  demonstrates regional wall motion abnormalities (see scoring  diagram/findings for description). Left ventricular  diastolic parameters are consistent with Grade III diastolic dysfunction  (  restrictive). Elevated left  ventricular end-diastolic pressure. There is  akinesis of the left ventricular, mid inferoseptal wall and anteroseptal  wall. There is akinesis of the  left ventricular, apical anterior wall. There is akinesis of the left  ventricular, entire inferior wall and apical segment.  2. Right ventricular systolic function is moderately reduced. The right  ventricular size is normal. There is mildly elevated pulmonary artery  systolic pressure. The estimated right ventricular systolic pressure is  94.5 mmHg.  3. Left atrial size was mildly dilated.  4. Right atrial size was moderately dilated.  5. The mitral valve is normal in structure. Moderate mitral valve  regurgitation. No evidence of mitral stenosis.  6. Tricuspid valve regurgitation is mild to moderate.  7. The aortic valve is tricuspid. There is moderate calcification of the  aortic valve. There is moderate thickening of the aortic valve. Aortic  valve regurgitation is not visualized. Mild to moderate aortic valve  sclerosis/calcification is present,  without any evidence of aortic stenosis. Aortic valve area, by VTI  measures 2.45 cm. Aortic valve mean gradient measures 5.0 mmHg. Aortic  valve Vmax measures 1.47 m/s.  8. Aortic dilatation noted. There is mild dilatation of the ascending  aorta, measuring 38 mm.  9. The inferior vena cava is dilated in size with <50% respiratory  variability, suggesting right atrial pressure of 15 mmHg.     Coronary Stent Intervention  2017  Left Heart Cath and Cors/Grafts Angiography  Conclusion   Prox RCA lesion, 100% stenosed.  Mid LAD lesion, 100% stenosed.  LIMA was injected is normal in caliber, and is anatomically normal.  SVG was injected .  There is severe disease in the graft.  Occluded.  SVG was not injected .  Known to be occluded.  Origin lesion, 100% stenosed.  Origin lesion, 100% stenosed.  There is moderate left ventricular systolic dysfunction.  Ost  Ramus to Ramus lesion, 99% stenosed. Post intervention, there is a 0% residual stenosis.   1. Severe triple vessel s/p 3V CABG with 1/3 patent bypass grafts.  2. Occluded mid LAD with patent LIMA to LAD 3. Severe stenosis in the proximal segment of the moderate caliber Ramus branch. The vein graft to this branch is now occluded.  4. Chronic occlusion mid RCA. The vein graft to this branch is known to be occluded.  5. Moderate LV systolic dysfunction, chronic.  6. Successful PTCA/DES x 2 Ramus intermediate branch.   Recommendations: Will continue ASA and Brilinta for now. He will be randomized in the TWILIGHT study (ASA/Brlinta for 3 months, then at 3 months, he will either stop ASA and continue Brilinta alone or continue both meds). Continue statin and beta blocker.       ASSESSMENT & PLAN:      1. Hypotension - unclear etiology - stopping Norvasc today along with his Avapro which he has already taken today. Ok to try some Pedialyte today. He will need close follow up.   2. Flank pain - abnormal abdominal US - as noted - LFTs basically ok.   3. Known CAD with remote CABG and only has 1 of 3 grafts patent - LIMA to LAD - last cath in 2017 - managed medically. He denies chest pain at this time.   4. HLD - not discussed.   5. Chronic systolic HF - recent echo unchanged - remains short of breath with exertion. Having to stop medicines today due to marked hypotension.   6. Underlying ICD  7. Advanced age -  may need to begin discussions regarding long term goals. I worry that something else is going on with him.   Current medicines are reviewed with the patient today.  The patient does not have concerns regarding medicines other than what has been noted above.  The following changes have been made:  See above.  Labs/ tests ordered today include:   No orders of the defined types were placed in this encounter.    Disposition:   FU with Dr. Acie Fredrickson or team next week. Stopping Norvasc  and Avapro today.   Patient is agreeable to this plan and will call if any problems develop in the interim.   SignedTruitt Merle, NP  11/06/2020 9:31 AM  [facility] [address] [place], [place]  [address] Phone: [phone] Fax: [phone]

## 2020-11-05 NOTE — Progress Notes (Signed)
Augmentin

## 2020-11-06 ENCOUNTER — Other Ambulatory Visit: Payer: Self-pay

## 2020-11-06 ENCOUNTER — Encounter: Payer: Self-pay | Admitting: Nurse Practitioner

## 2020-11-06 ENCOUNTER — Ambulatory Visit (INDEPENDENT_AMBULATORY_CARE_PROVIDER_SITE_OTHER): Payer: Medicare Other | Admitting: Nurse Practitioner

## 2020-11-06 VITALS — BP 88/48 | HR 75 | Ht 67.0 in | Wt 165.4 lb

## 2020-11-06 DIAGNOSIS — I10 Essential (primary) hypertension: Secondary | ICD-10-CM | POA: Diagnosis not present

## 2020-11-06 DIAGNOSIS — I952 Hypotension due to drugs: Secondary | ICD-10-CM | POA: Diagnosis not present

## 2020-11-06 DIAGNOSIS — I5022 Chronic systolic (congestive) heart failure: Secondary | ICD-10-CM | POA: Diagnosis not present

## 2020-11-06 DIAGNOSIS — I255 Ischemic cardiomyopathy: Secondary | ICD-10-CM | POA: Diagnosis not present

## 2020-11-06 NOTE — Patient Instructions (Addendum)
After Visit Summary:  We will be checking the following labs today - NONE   Medication Instructions:    Continue with your current medicines. BUT  STOP Amlodipine   STOP Losartan   If you need a refill on your cardiac medications before your next appointment, please call your pharmacy.     Testing/Procedures To Be Arranged:  N/A  Follow-Up:   See Dr. Acie Fredrickson or Nicki Reaper next week     At [facility], you and your health needs are our priority.  As part of our continuing mission to provide you with exceptional heart care, we have created designated Provider Care Teams.  These Care Teams include your primary Cardiologist (physician) and Advanced Practice Providers (APPs -  Physician Assistants and Nurse Practitioners) who all work together to provide you with the care you need, when you need it.  Special Instructions:  . Stay safe, wash your hands for at least 20 seconds and wear a mask when needed.  . It was good to talk with you today. . Get some Pedialyte to drink today - this can help bring your BP up.   . Your BP should start coming back up in the next day or so. Continue to monitor.    Call the [place] office at [phone] if you have any questions, problems or concerns.

## 2020-11-11 ENCOUNTER — Encounter: Payer: Self-pay | Admitting: Cardiovascular Disease

## 2020-11-11 NOTE — Progress Notes (Signed)
Cardiology Office Note   Date:  11/12/2020   ID:  [name] Gann., DOB [birth year], MRN [id number]  PCP:  Cassandria Anger, MD  Cardiologist:  Mertie Moores, MD   Problem list 1. Coronary artery disease-status post coronary artery bypass grafting 2. Hyperlipidemia 3. Chronic systolic congestive heart failure - EF 35-40% 4. ICD -  5. OSA - Fransico Him, MD   Chief Complaint  Patient presents with  . Congestive Heart Failure      Notes prior to 2016  [name] [name]. is a 87 y.o. male who presents today to follow-up coronary disease and ischemic cardiomyopathy and a history of hypertension. He did have an episode of syncope in the past that was related to hypotension. More recently his blood pressure has been elevated. He was hesitant to have his ramapril dose increased because he associated this with his syncopal episode in the past. Losartan was added to his medications. He is getting excellent control and I have left him on both. He feels good. There is no chest pain. He is fully active. He has an ICD in place that is followed carefully by electrophysiology.  Retired [name] ( [employer] )   Nov. 1, 2016:  Doing well.  No CP or dyspnea. 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   January 08, 2016:  See for follow up for his CAD / CABG, chronic systolic CHF , HTN Doing well.    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   Stays active  Does not ride anymore.  Has some fatigue , no CP   Jan. 4, 2018:  Still taking care of his horses.   Feeds and hay twice  No CP , no dyspnea.   Aug. 29, 2018:  [name] [name] is doing well from a cardiac standpoint  Has leg pain . 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 25, 2019:  Doing well .  Gets fatigued when he goes down to take care of the horses.  No CP or dyspnea .  Some DOE with working   May 25, 2018: Seen for follow  Still having some chest  pain .   Lasted 30 minutes.    Took NTG with relief. Chest tightness with activity Has known severe CAD  With patent LIMA. Had 2 DES stents to his ramus Int. In 2017.  His symptoms are not nearly as severe as they were in 2017 when he was sent to the hospital and had stents placed in the ramus intermediate branch.  September 03, 2018: [name] [name] is seen today for follow-up of his coronary artery disease He has had an ICD placed for primary prevention. Was having some dyspnea,   Dr. Caryl Comes increased Imdur to 60  Mg a day which has helped.   Has swelling   March 08, 2019 :  I saw [name] [name] as a telemedicine visit on Jan 25, 2019 .  Having more chest pressure with exertion .  Has marked DOE , when dressing  Legs feel heavy ,  Has leg edema Avoids salty foods.  Takes NTG which has helped the chest tightness.  The IMdur has helped.   June 28, 2019:  [name] [name] is seen today for follow-up visit.  He has a history of coronary artery disease and is status post coronary artery bypass grafting.  He has an ICD placed  for primary prevention. Gets DOE with getting dressed and working with the horses.  I reviewed his cardiac catheterization films from 2017.  He has a patent LIMA to the distal LAD.  He has patent circumflex artery.  There is disease in some of the small branches.  That the RCA is occluded.  Saphenous vein grafts are occluded.  Avoids salty foods.   Will increase Imdur to 120 mg BID and will add Ranexa 500 mg PO BID today   December 27, 2019: [name] [name] is an 87 year old gentleman with a history of coronary artery disease. Complains of not sleeping well at night  Complains of lack of energy  No CP ,  No dyspnea,  Just fatigue his VS look good today  He was started on Ranexa about a year ago .  Seems to be helping with his angina  I gave his the OK to have Avastin injections into his eye   Oct. 27, 2021 [name] [name] is seen for follow up of his CAD, CABG, CHF Has some episodes of chest  pressure  Does not use NTG  On Ranexa  Jan. 20, 2022  [name] [name] is seen today for follow up visit for his CAD  Having more chest discomfort / chest pressure  Now gets out of breath with minimal exertion - has even while getting dressed.  NTG seems to help He is on Ranexa  Has lots of congestion in his chest  Echocardiogram from June, 2020 reveals moderate to severe LV dysfunction with ejection fraction of 30 to 35%.  He has grade 2 diastolic dysfunction. He takes lasix 60 mg a day scheduled  He has been hesitant to increse his lasix because of his CKD.   Feb. 28, 2022: [name] [name] is seen today for follow up of his CHF, CAD He has significant CKD ,  Creatinine 2 weeks ago was 2.0 Who is very short of breath at his last visit.  We had him increase his furosemide to 60 mg twice a day and increase his potassium to 10 mEq twice a day.  His blood pressure fell and he was seen by [name] Merle, NP on February 22.  His blood pressure was 85-27 systolic. His amlodipine was stopped.  He also stopped his Avapro. Feeling better now that his BP is better.  Breathing is better . No significant CP Is on Ranexa  Is having gall bladder surgery in 2 weeks .   Past Medical History:  Diagnosis Date  . AICD (automatic cardioverter/defibrillator) present   . Arrhythmia   . Arthritis    "hands, legs" (03/26/2016)  . 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 Chronic systolic CHF (congestive heart failure) ([place])    a. Echo 9/14: Mild LVH, mild focal basal septal hypertrophy, EF 35-40%, anteroseptal HK, normal diastolic function, mild MR, mild LAE, PASP 34 mmHg  . Chronotropic incompetence    treated w/pacemaker, ICD 7.2008.  This was placed after CPX done post 2008 cath. CPX showed only chronotropic incompetence  . History of blood transfusion 1969   "after getting MSO4; it destroys my corpuscles" (03/26/2016)  . History of colon polyps   . Hyperlipidemia   .  Hypertension   . Migraine    "just before my bypass" (03/26/2016)  . Mitral regurgitation    mild  . Myocardial infarction ([place]) "several"  . Numbness and tingling of left arm and leg    improved after tx w/prednisone  . OSA (obstructive sleep apnea) 09/01/2016   Severe OSA with AHI 38/hr now on BiPAP at 21/17cm H2O  . Syncopal episodes    relative hypotension    Past Surgical History:  Procedure Laterality Date  . BACK SURGERY    . CARDIAC CATHETERIZATION  1980s-1990s X 4  . CARDIAC CATHETERIZATION N/A 03/26/2016   Procedure: Left Heart Cath and Cors/Grafts Angiography;  Surgeon: Burnell Blanks, MD;  Location: [facility];  Service: Cardiovascular;  Laterality: N/A;  . CARDIAC CATHETERIZATION N/A 03/26/2016   Procedure: Coronary Stent Intervention;  Surgeon: Burnell Blanks, MD;  Location: [facility];  Service: Cardiovascular;  Laterality: N/A;  . CARDIAC DEFIBRILLATOR PLACEMENT  2008  . CATARACT EXTRACTION W/ INTRAOCULAR LENS  IMPLANT, BILATERAL Bilateral 08/2013 - 09/2013   right - left   . CORONARY ANGIOPLASTY WITH STENT PLACEMENT  ~ 1992; 03/26/2016   "1 + 2"  . CORONARY ARTERY BYPASS GRAFT  1992   CABG "X4"  . CYSTOSCOPY  1980s  . GAS/FLUID EXCHANGE Right 06/18/2017   Procedure: GAS/FLUID EXCHANGE (C3F8);  Surgeon: Sherlynn Stalls, MD;  Location: [facility];  Service: Ophthalmology;  Laterality: Right;  . IMPLANTABLE CARDIOVERTER DEFIBRILLATOR (ICD) GENERATOR CHANGE N/A 06/21/2014   Procedure: ICD GENERATOR CHANGE;  Surgeon: Deboraha Sprang, MD;  Location: [facility];  Service: Cardiovascular;  Laterality: N/A;  . PARS PLANA VITRECTOMY Right 06/18/2017   Procedure: PARS PLANA VITRECTOMY WITH 25 GAUGE;  Surgeon: Sherlynn Stalls, MD;  Location: [facility];  Service: Ophthalmology;  Laterality: Right;  . PHOTOCOAGULATION WITH LASER Right 06/18/2017   Procedure: PHOTOCOAGULATION WITH LASER- ENDO LASER;  Surgeon: Sherlynn Stalls, MD;  Location: [facility];  Service: Ophthalmology;  Laterality: Right;  . POSTERIOR LUMBAR FUSION  ~ 1989   "fused 2 discs"  . TIBIA FRACTURE SURGERY Left 1943   "put artificial bone in there"    Patient Active Problem List   Diagnosis Date Noted  . Chronic systolic CHF (congestive heart failure) ([place]) 11/18/2013    Priority: High  . CAD (coronary artery disease)     Priority: High  . Flank pain 10/31/2020  . Nausea & vomiting 10/31/2020  . Insomnia 01/05/2020  . Cat bite 07/20/2019  . DOE (dyspnea on exertion) 05/31/2019  . Edema 05/31/2019  . Varicose veins of both lower extremities without ulcer or inflammation 02/02/2018  . Allergic rhinitis 12/29/2017  . HLD (hyperlipidemia) 11/09/2017  . Left lateral epicondylitis 07/02/2017  . Knee pain, right 12/24/2016  . OSA (obstructive sleep apnea) 09/01/2016  . Status post coronary artery stent placement   . Hypertensive heart disease with heart failure ([place])   . Unstable angina ([place])   . Thumb pain 05/04/2015  . Ejection fraction   . Well adult exam 07/13/2012  . Chronic renal insufficiency, stage III (moderate) ([place]) 07/21/2011  . Ischemic cardiomyopathy   . Hx of CABG   . Chronotropic incompetence   . Syncopal episodes   . Mitral regurgitation   . Numbness and tingling of left arm and leg   . Arm pain, diffuse, left 10/22/2009  . PARESTHESIA 10/22/2009  . TOBACCO USE, QUIT 10/22/2009  . SEBACEOUS CYST 08/27/2009  . Cough 02/28/2009  . Anemia, chronic disease  02/18/2008  . Dyslipidemia 05/03/2007  . Hypertensive heart disease 05/03/2007  . COLONIC POLYPS, HX OF 05/03/2007  . Automatic implantable cardioverter-defibrillator in situ 03/16/2007      Current Outpatient Medications  Medication Sig Dispense Refill  .  Acetaminophen-Codeine (TYLENOL/CODEINE #3) 300-30 MG tablet Take 0.5-1 tablets by mouth every 4 (four) hours as needed for pain. 20 tablet 1  . atorvastatin (LIPITOR) 40 MG tablet TAKE 1 TABLET DAILY 90 tablet 3  . carvedilol (COREG) 12.5 MG tablet TAKE 1 TABLET TWICE A DAY 180 tablet 3  . Cholecalciferol 1000 UNITS tablet Take 1,000 Units by mouth daily.    . clopidogrel (PLAVIX) 75 MG tablet TAKE 1 TABLET DAILY 90 tablet 2  . furosemide (LASIX) 40 MG tablet Take 1.5 tablets (60 mg total) by mouth daily. 135 tablet 1  . isosorbide mononitrate (IMDUR) 120 MG 24 hr tablet TAKE 1 TABLET DAILY 90 tablet 2  . Multiple Vitamins-Minerals (PRESERVISION AREDS 2) CAPS Take 1 capsule by mouth daily.     . nitroGLYCERIN (NITROLINGUAL) 0.4 MG/SPRAY spray Place 1 spray under the tongue every 5 (five) minutes x 3 doses as needed for chest pain. 25 g 3  . ondansetron (ZOFRAN) 4 MG tablet Take 1 tablet (4 mg total) by mouth every 8 (eight) hours as needed for nausea or vomiting. 20 tablet 0  . potassium chloride (KLOR-CON) 10 MEQ tablet TAKE 1 TABLET DAILY 90 tablet 3  . ranolazine (RANEXA) 1000 MG SR tablet TAKE 1 TABLET TWICE A DAY 180 tablet 3  . traZODone (DESYREL) 50 MG tablet Take 0.5-1 tablets (25-50 mg total) by mouth at bedtime as needed for sleep. 30 tablet 5   No current facility-administered medications for this visit.    Allergies:   Morphine and Ramipril    Social History:  The patient  reports that he has quit smoking. His smoking use included cigars. He quit after 29.00 years of use. He has never used smokeless tobacco. He reports previous alcohol use. He reports that he does not use drugs.   Family History:  The patient's family history includes Colon cancer in an other family member; Hypertension in his father, mother, and another family member.    ROS: Noted in current history, otherwise review of systems is negative.    Physical Exam: Blood pressure 112/60, pulse 74, height 5\' 7"   (1.702 m), weight 162 lb 12.8 oz (73.8 kg), SpO2 97 %.  GEN:  Well nourished, well developed in no acute distress HEENT: Normal NECK: No JVD; No carotid bruits LYMPHATICS: No lymphadenopathy CARDIAC: RRR ,  Soft systolic murmur  RESPIRATORY:  Clear to auscultation without rales, wheezing or rhonchi  ABDOMEN: Soft, non-tender, non-distended MUSCULOSKELETAL:  No edema; No deformity  SKIN: Warm and dry NEUROLOGIC:  Alert and oriented x 3   EKG:          Recent Labs: 05/22/2020: NT-Pro BNP 5,067 10/31/2020: ALT 12; BUN 44; Creatinine, Ser 2.00; Hemoglobin 9.8; Platelets 205.0; Potassium 4.5; Sodium 132    Lipid Panel    Component Value Date/Time   CHOL 88 (L) 12/27/2019 1029   TRIG 117 12/27/2019 1029   HDL 35 (L) 12/27/2019 1029   CHOLHDL 2.5 12/27/2019 1029   CHOLHDL 4 12/24/2016 1103   VLDL 41.8 (H) 12/24/2016 1103   LDLCALC 32 12/27/2019 1029   LDLDIRECT 35.0 12/24/2016 1103      Wt Readings from Last 3 Encounters:  11/12/20 162 lb 12.8 oz (73.8 kg)  11/06/20 165 lb 6.4  oz (75 kg)  11/01/20 156 lb (70.8 kg)      Current medicines are reviewed  The patient understands his medications.   ASSESSMENT AND PLAN:  1. Coronary artery disease-   No angina    2. Hyperlipidemia -      Stable  Cont meds.   3. Chronic systolic congestive heart failure -   . Seems to be stable He was very hypotensive last week.  Amlodipine and Avapro were stopped.   He is drinking some pediolyte.  Seems to be stable  Ive asked his wife to cut back on the "extra salt" some but to keep him hydrated.  He needs gall bladder surgery .  He is at moderate risk for his surgery due to his CHF , age, CAD But appears to be well "tuned up" for the procedure .  4. ICD -  Per EP   5. Obstructive sleep apnea -   6.  Gall stones:   Needs to have surgery .   He is at moderate risk for CV complications from the surgery .    Mertie Moores, MD  11/12/2020 1:54 PM    [facility],  [address] [place], [place]  [address] Pager [phone] Phone: [phone]; Fax: [name]

## 2020-11-12 ENCOUNTER — Other Ambulatory Visit: Payer: Self-pay

## 2020-11-12 ENCOUNTER — Ambulatory Visit (INDEPENDENT_AMBULATORY_CARE_PROVIDER_SITE_OTHER): Payer: Medicare Other | Admitting: Cardiovascular Disease

## 2020-11-12 ENCOUNTER — Encounter: Payer: Self-pay | Admitting: Cardiovascular Disease

## 2020-11-12 VITALS — BP 112/60 | HR 74 | Ht 67.0 in | Wt 162.8 lb

## 2020-11-12 DIAGNOSIS — I5022 Chronic systolic (congestive) heart failure: Secondary | ICD-10-CM | POA: Diagnosis not present

## 2020-11-12 DIAGNOSIS — I25119 Atherosclerotic heart disease of native coronary artery with unspecified angina pectoris: Secondary | ICD-10-CM

## 2020-11-12 DIAGNOSIS — I255 Ischemic cardiomyopathy: Secondary | ICD-10-CM | POA: Diagnosis not present

## 2020-11-12 NOTE — Patient Instructions (Addendum)
Medication Instructions:  Your physician recommends that you continue on your current medications as directed. Please refer to the Current Medication list given to you today.  *If you need a refill on your cardiac medications before your next appointment, please call your pharmacy*   Lab Work: none If you have labs (blood work) drawn today and your tests are completely normal, you will receive your results only by: Marland Kitchen MyChart Message (if you have MyChart) OR . A paper copy in the mail If you have any lab test that is abnormal or we need to change your treatment, we will call you to review the results.   Testing/Procedures: none   Follow-Up: At [facility], you and your health needs are our priority.  As part of our continuing mission to provide you with exceptional heart care, we have created designated Provider Care Teams.  These Care Teams include your primary Cardiologist (physician) and Advanced Practice Providers (APPs -  Physician Assistants and Nurse Practitioners) who all work together to provide you with the care you need, when you need it.   Your next appointment:  01/15/21 at 12:15pm 3 month(s)   The format for your next appointment:   In Person  Provider:   You will see one of the following Advanced Practice Providers on your designated Care Team:    Richardson Dopp, PA-C  [place], [place]

## 2020-11-13 ENCOUNTER — Telehealth: Payer: Self-pay | Admitting: Internal Medicine

## 2020-11-13 NOTE — Telephone Encounter (Signed)
See a surgeon first. See me in 4-6 wks if ok Thx

## 2020-11-13 NOTE — Telephone Encounter (Signed)
Patients wife calling, they saw the results of the ultrasound and saw it said the patient should come in but they also received a call from the surgeons office to come in about his gallbladder so they weren't sure if he needed to come in for both visits or just the surgeon

## 2020-11-13 NOTE — Telephone Encounter (Signed)
Called pt spoke w/wife gave MD response. Made appt for 12/29/20 @ 10:40.Marland KitchenJohny Chess

## 2020-11-15 ENCOUNTER — Other Ambulatory Visit: Payer: Self-pay | Admitting: Physician Assistant

## 2020-11-27 NOTE — Progress Notes (Signed)
Cardiology Office Note:    Date:  11/28/2020   ID:  [name] Curia., DOB [birth year], MRN [id number]  PCP:  Cassandria Anger, MD   [place]  Cardiologist:  Mertie Moores, MD   Advanced Practice Provider:  Liliane Shi, PA-C Electrophysiologist:  Virl Axe, MD       Referring MD: Cassandria Anger, MD   Chief Complaint:  Follow-up (CAD, CHF)    Patient Profile:    [name] Vivona. is a 87 y.o. male with:   Coronary artery disease  ? S/p CABG in 1992 ? [place] 7/17: L-LAD ok, S-Lat ramus+S-RCA100, severe ostial RI dz >> PCI:DES x 2 to RI ? Chronic angina   Chronic systolic CHF ? Ischemic CM  ? Echocardiogram 02/2019: EF 30-35, Gr 2 DD, moderate MR ? Echocardiogram 7/18: EF 30-35, Gr 2 DD  Mild regurgitation  Chronotropic incompetence s/p pacer/ICD  Chronic kidney disease   OSA (Dr. Radford Pax)   Hx of syncope  NSVT  Hyperlipidemia  Gallstones    Prior CV studies: Echocardiogram 03/15/2019 EF 30-35, mild LVH, Gr 2 DD, normal RVSF, mod LAE, mild MAC, mod MR, trivial AI  Echocardiogram 03/30/2017  Mod to severe LVH, EF 30-35, Gr 2 DD, trivial AI, mild to mod MR, mild to mod reduced RVSF, mild increased PASP  LHC 03/26/16 LAD mid 100% CTO RI ostial 99% LCx okay RCA proximal 100% LIMA-LAD okay SVG-lateral ramus 100% SVG-distal RCA 100% CTO EF 35-45% PCI:2.25 x 28 and 2.25 x 16 mm Promus Premier DES to the RI  Echo 9/14 Mild LVH, mild focal basal septal hypertrophy, EF 35-40%, anteroseptal HK, normal diastolic function, mild MR, mild LAE, PASP 34 mmHg  History of Present Illness:    Mr. Blixt was last seen by Dr. Acie Fredrickson in 2/22.  He had previously been seen by Truitt Merle, NP for low BP.  His Amlodipine and Avapro were stopped.  He was feeling better at his OV with Dr. Acie Fredrickson and his angina was well controlled.  He returns for f/u.    He is here today with his wife.  He brings in a list of his blood  pressures.  He still has some blood pressures in the 90s.  He does describe some feelings of fatigue/exhaustion.  He also notes worsening shortness of breath.  He uses CPAP at night.  He has not had orthopnea.  He has chronic leg edema.  He wears compression stockings.  He has had increasing amounts of exertional chest heaviness relieved by nitroglycerin.  He has not had syncope.      Past Medical History:  Diagnosis Date  . AICD (automatic cardioverter/defibrillator) present   . Arrhythmia   . Arthritis    "hands, legs" (03/26/2016)  . 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 Chronic systolic CHF (congestive heart failure) ([place])    a. Echo 9/14: Mild LVH, mild focal basal septal hypertrophy, EF 35-40%, anteroseptal HK, normal diastolic function, mild MR, mild LAE, PASP 34 mmHg  . Chronotropic incompetence    treated w/pacemaker, ICD 7.2008. This was placed after CPX done post 2008 cath. CPX showed only chronotropic incompetence  .  History of blood transfusion 1969   "after getting MSO4; it destroys my corpuscles" (03/26/2016)  . History of colon polyps   . Hyperlipidemia   . Hypertension   . Migraine    "just before my bypass" (03/26/2016)  . Mitral regurgitation    mild  . Myocardial infarction ([place]) "several"  . Numbness and tingling of left arm and leg    improved after tx w/prednisone  . OSA (obstructive sleep apnea) 09/01/2016   Severe OSA with AHI 38/hr now on BiPAP at 21/17cm H2O  . Syncopal episodes    relative hypotension    Current Medications: Current Meds  Medication Sig  . Acetaminophen-Codeine (TYLENOL/CODEINE #3) 300-30 MG tablet Take 0.5-1 tablets by mouth every 4 (four) hours as needed  for pain.  Marland Kitchen atorvastatin (LIPITOR) 40 MG tablet TAKE 1 TABLET DAILY  . Cholecalciferol 1000 UNITS tablet Take 1,000 Units by mouth daily.  . clopidogrel (PLAVIX) 75 MG tablet TAKE 1 TABLET DAILY  . furosemide (LASIX) 40 MG tablet TAKE ONE AND ONE-HALF TABLETS IN THE MORNING AND 1 TABLET IN THE EVENING  . isosorbide mononitrate (IMDUR) 120 MG 24 hr tablet TAKE 1 TABLET DAILY  . Multiple Vitamins-Minerals (PRESERVISION AREDS 2) CAPS Take 1 capsule by mouth daily.   . nitroGLYCERIN (NITROSTAT) 0.4 MG SL tablet Place 1 tablet (0.4 mg total) under the tongue every 5 (five) minutes as needed for chest pain.  Marland Kitchen ondansetron (ZOFRAN) 4 MG tablet Take 1 tablet (4 mg total) by mouth every 8 (eight) hours as needed for nausea or vomiting.  . potassium chloride (KLOR-CON) 10 MEQ tablet TAKE 1 TABLET DAILY  . ranolazine (RANEXA) 1000 MG SR tablet TAKE 1 TABLET TWICE A DAY  . traZODone (DESYREL) 50 MG tablet Take 0.5-1 tablets (25-50 mg total) by mouth at bedtime as needed for sleep.  . [DISCONTINUED] carvedilol (COREG) 12.5 MG tablet TAKE 1 TABLET TWICE A DAY  . [DISCONTINUED] nitroGLYCERIN (NITROLINGUAL) 0.4 MG/SPRAY spray Place 1 spray under the tongue every 5 (five) minutes x 3 doses as needed for chest pain.     Allergies:   Morphine and Ramipril   Social History   Tobacco Use  . Smoking status: Former Smoker    Years: 29.00    Types: Cigars  . Smokeless tobacco: Never Used  . Tobacco comment: quit smoking cigars in 1973  Vaping Use  . Vaping Use: Never used  Substance Use Topics  . Alcohol use: Not Currently    Comment: "quit drinking in 1973"  . Drug use: No     Family Hx: The patient's family history includes Colon cancer in an other family member; Hypertension in his father, mother, and another family member.  Review of Systems  Gastrointestinal: Negative for hematochezia and melena.  Genitourinary: Negative for hematuria.     EKGs/Labs/Other Test Reviewed:    EKG:  EKG is not  ordered today.  The ekg ordered today demonstrates n/a  Recent Labs: 05/22/2020: NT-Pro BNP 5,067 10/31/2020: ALT 12; BUN 44; Creatinine, Ser 2.00; Hemoglobin 9.8; Platelets 205.0; Potassium 4.5; Sodium 132   Recent Lipid Panel Lab Results  Component Value Date/Time   CHOL 88 (L) 12/27/2019 10:29 AM   TRIG 117 12/27/2019 10:29 AM   HDL 35 (L) 12/27/2019 10:29 AM   CHOLHDL 2.5 12/27/2019 10:29 AM   CHOLHDL 4 12/24/2016 11:03 AM   LDLCALC 32 12/27/2019 10:29 AM   LDLDIRECT 35.0 12/24/2016 11:03 AM      Risk Assessment/Calculations:  Physical Exam:    VS:  BP 118/60   Pulse 70   Ht _0  (1.702 m)   Wt 167 lb 9.6 oz (76 kg)   SpO2 98%   BMI 26.25 kg/m     Wt Readings from Last 3 Encounters:  11/28/20 167 lb 9.6 oz (76 kg)  11/12/20 162 lb 12.8 oz (73.8 kg)  11/06/20 165 lb 6.4 oz (75 kg)     Constitutional:      Appearance: Healthy appearance. Not in distress.  Neck:     Vascular: JVD elevated.  Pulmonary:     Effort: Pulmonary effort is normal.     Breath sounds: No wheezing. No rales.  Cardiovascular:     Normal rate. Regular rhythm. Normal S1. Normal S2.     Murmurs: There is no murmur.  Edema:    Pretibial: bilateral 1+ edema of the pretibial area.    Comments: Compression stockings in place Abdominal:     General: There is distension.     Palpations: Abdomen is soft.  Skin:    General: Skin is warm and dry.  Neurological:     General: No focal deficit present.     Mental Status: Alert and oriented to person, place and time.     Cranial Nerves: Cranial nerves are intact.       ASSESSMENT & PLAN:    1. Acute on chronic HFrEF (heart failure with reduced ejection fraction) (HCC) EF 30-35 echocardiogram 02/2019.  He has an ischemic cardiomyopathy.  NYHA III.  He has evidence of volume excess on exam.  His weight is up 5 pounds since last visit.  He has chronic leg edema and his abdomen is distended.  He does not have rales on exam.  I have recommended  increasing his diuretic therapy for the next 3 days.  He may need to take a higher dose of furosemide chronically.  -Increase furosemide to 80 mg twice daily x3 days  -After 3 days, resume furosemide 60 mg in the morning and 40 mg in the afternoon  -BMET, CBC  -Close follow-up next week with me or Dr. Acie Fredrickson  2. Coronary artery disease involving native coronary artery of native heart with angina pectoris ([place]) Status post CABG.  Cardiac catheterization 2017 with 2/3 grafts occluded.  He had a DES to the RI at that time.  He has chronic angina.  His angina tends to get worse when he is volume overloaded.  I will increase his furosemide as outlined above.  He is seeing the general surgeon later this week about his gallbladder.  He will likely need to have this removed.  I will bring him back next week for close follow-up to reassess his symptoms.  Continue atorvastatin, clopidogrel, carvedilol, isosorbide, ranolazine.  3. Stage 3b chronic kidney disease ([place]) Obtain follow-up BMET today.  4. Essential hypertension Blood pressure continues to run somewhat low.  Decrease carvedilol to 6.25 mg twice daily.   Dispo:  Return in about 1 week (around 12/05/2020) for Close Follow Up, w/ Dr. Acie Fredrickson in person.   Medication Adjustments/Labs and Tests Ordered: Current medicines are reviewed at length with the patient today.  Concerns regarding medicines are outlined above.  Tests Ordered: Orders Placed This Encounter  Procedures  . Basic Metabolic Panel (BMET)  . CBC   Medication Changes: Meds ordered this encounter  Medications  . DISCONTD: carvedilol (COREG) 6.25 MG tablet    Sig: Take 1 tablet (6.25 mg total) by mouth 2 (two) times  daily.    Dispense:  180 tablet    Refill:  3  . nitroGLYCERIN (NITROSTAT) 0.4 MG SL tablet    Sig: Place 1 tablet (0.4 mg total) under the tongue every 5 (five) minutes as needed for chest pain.    Dispense:  25 tablet    Refill:  0  . carvedilol (COREG) 12.5 MG  tablet    Sig: Take one half tablet (6.25 mg) two times daily by mouth.    Dispense:  90 tablet    Refill:  3    Signed, Richardson Dopp, PA-C  11/28/2020 1:18 PM    [facility] [address], [place], [place]  [address] Phone: [phone]; Fax: [phone]

## 2020-11-28 ENCOUNTER — Other Ambulatory Visit: Payer: Self-pay

## 2020-11-28 ENCOUNTER — Ambulatory Visit (INDEPENDENT_AMBULATORY_CARE_PROVIDER_SITE_OTHER): Payer: Medicare Other | Admitting: Physician Assistant

## 2020-11-28 ENCOUNTER — Encounter: Payer: Self-pay | Admitting: Physician Assistant

## 2020-11-28 VITALS — BP 118/60 | HR 70 | Ht 67.0 in | Wt 167.6 lb

## 2020-11-28 DIAGNOSIS — E782 Mixed hyperlipidemia: Secondary | ICD-10-CM

## 2020-11-28 DIAGNOSIS — I25119 Atherosclerotic heart disease of native coronary artery with unspecified angina pectoris: Secondary | ICD-10-CM | POA: Diagnosis not present

## 2020-11-28 DIAGNOSIS — Z950 Presence of cardiac pacemaker: Secondary | ICD-10-CM

## 2020-11-28 DIAGNOSIS — I5023 Acute on chronic systolic (congestive) heart failure: Secondary | ICD-10-CM | POA: Diagnosis not present

## 2020-11-28 DIAGNOSIS — I10 Essential (primary) hypertension: Secondary | ICD-10-CM | POA: Diagnosis not present

## 2020-11-28 DIAGNOSIS — N1832 Chronic kidney disease, stage 3b: Secondary | ICD-10-CM | POA: Diagnosis not present

## 2020-11-28 DIAGNOSIS — I5020 Unspecified systolic (congestive) heart failure: Secondary | ICD-10-CM

## 2020-11-28 MED ORDER — NITROGLYCERIN 0.4 MG SL SUBL: 0.4000 mg | SUBLINGUAL_TABLET | SUBLINGUAL | 0 refills | Status: AC | PRN

## 2020-11-28 MED ORDER — CARVEDILOL 12.5 MG PO TABS: ORAL_TABLET | ORAL | 3 refills | Status: AC

## 2020-11-28 MED ORDER — CARVEDILOL 6.25 MG PO TABS: 6.2500 mg | ORAL_TABLET | Freq: Two times a day (BID) | ORAL | 3 refills | Status: DC

## 2020-11-28 NOTE — Patient Instructions (Signed)
Medication Instructions:  Your physician has recommended you make the following change in your medication:   1.  Decrease Coreg take one half tablet by mouth ( 6.25 mg ) twice daily. 2.  Sent in nitroglycerin to requested pharmacy.  3.  Increase Lasix to two tablets ( 80 mg) by mouth twice daily X 3 days than go back to ( 60 mg) am and ( 40 mg) pm.   *If you need a refill on your cardiac medications before your next appointment, please call your pharmacy*   Lab Work: Your physician recommends that you return for lab work in: bmet/cbc  If you have labs (blood work) drawn today and your tests are completely normal, you will receive your results only by: Marland Kitchen MyChart Message (if you have MyChart) OR . A paper copy in the mail If you have any lab test that is abnormal or we need to change your treatment, we will call you to review the results.   Testing/Procedures: -None   Follow-Up: At [facility], you and your health needs are our priority.  As part of our continuing mission to provide you with exceptional heart care, we have created designated Provider Care Teams.  These Care Teams include your primary Cardiologist (physician) and Advanced Practice Providers (APPs -  Physician Assistants and Nurse Practitioners) who all work together to provide you with the care you need, when you need it.  We recommend signing up for the patient portal called "MyChart".  Sign up information is provided on this After Visit Summary.  MyChart is used to connect with patients for Virtual Visits (Telemedicine).  Patients are able to view lab/test results, encounter notes, upcoming appointments, etc.  Non-urgent messages can be sent to your provider as well.   To learn more about what you can do with MyChart, go to [email or web address].    Your next appointment:   1 week(s)  The format for your next appointment:   In Person  Provider:   Mertie Moores, MD   Other Instructions

## 2020-11-29 LAB — BASIC METABOLIC PANEL
BUN/Creatinine Ratio: 20 (ref 10–24)
BUN: 37 mg/dL — ABNORMAL HIGH (ref 8–27)
CO2: 21 mmol/L (ref 20–29)
Calcium: 9 mg/dL (ref 8.6–10.2)
Chloride: 99 mmol/L (ref 96–106)
Creatinine, Ser: 1.82 mg/dL — ABNORMAL HIGH (ref 0.76–1.27)
Glucose: 88 mg/dL (ref 65–99)
Potassium: 5 mmol/L (ref 3.5–5.2)
Sodium: 136 mmol/L (ref 134–144)
eGFR: 35 mL/min/{1.73_m2} — ABNORMAL LOW (ref >59)

## 2020-11-29 LAB — CBC
Hematocrit: 28.8 % — ABNORMAL LOW (ref 37.5–51.0)
Hemoglobin: 9.3 g/dL — ABNORMAL LOW (ref 13.0–17.7)
MCH: 27.4 pg (ref 26.6–33.0)
MCHC: 32.3 g/dL (ref 31.5–35.7)
MCV: 85 fL (ref 79–97)
Platelets: 252 10*3/uL (ref 150–450)
RBC: 3.4 x10E6/uL — ABNORMAL LOW (ref 4.14–5.80)
RDW: 14.9 % (ref 11.6–15.4)
WBC: 5.5 10*3/uL (ref 3.4–10.8)

## 2020-11-30 ENCOUNTER — Ambulatory Visit: Payer: Self-pay | Admitting: Surgery

## 2020-11-30 ENCOUNTER — Telehealth: Payer: Self-pay | Admitting: *Deleted

## 2020-11-30 DIAGNOSIS — K8020 Calculus of gallbladder without cholecystitis without obstruction: Secondary | ICD-10-CM | POA: Diagnosis not present

## 2020-11-30 NOTE — H&P (Signed)
[name] [name] Appointment: 11/30/2020 11:00 AM Location: [facility] Patient #: [id number] DOB: 1929-08-14 Undefined / Language: [name] [name] / Race: White Male  History of Present Illness [name] [name] A. [name] [name]; 11/30/2020 12:31 PM) Patient words: Patient presents for evaluation of gallstones. He has intermittent upper abdominal pain and nausea. Ultrasound shows gallstones without signs of cholecystitis. He is followed closely by cardiology has significant cardiac disease advanced age. The symptoms come and go states. Didn't present for the last couple months. He also has bouts of hypotension as well but basketing corrected with medication changes. The pain is in his right flank area states. It is not necessarily associated with eating but he can eat whatever he wants. The pain is sharp in nature and radiates to his back lasting for a few hours and going way most cases. He also has episodes of nausea but that sometimes occurs even if he doesn't eat. He does have shortness of breath with ambulation. He cannot walk very far without becoming quite winded he states. He also has significant cardiac disease and is not a candidate for any future intervention. Ejection fraction is about 30% due to ischemic cardiomyopathy.  The patient is a 88 year old male.   Past Surgical History [name] [name], [name]; 11/30/2020 10:41 AM) Bypass Surgery for Poor Blood Flow to Legs Coronary Artery Bypass Graft Knee Surgery Left.  Diagnostic Studies History [name] [name], [name]; 11/30/2020 10:41 AM) Colonoscopy 1-5 years ago  Allergies [name] [name], [name]; 11/30/2020 10:43 AM) Morphine Sulfate (Bulk) *ANALGESICS - OPIOID* Allergies Reconciled  Medication History [name] [name], [name]; 11/30/2020 10:43 AM) Acetaminophen-Codeine #3 (300-30MG  Tablet, Oral) Active. amLODIPine Besylate (2.5MG  Tablet, Oral) Active. Irbesartan (150MG  Tablet, Oral) Active. Nitroglycerin (0.4MG Dorita Fray Solution,  Translingual) Active. traZODone HCl (50MG  Tablet, Oral) Active. Medications Reconciled  Social History [name] [name], [name]; 11/30/2020 10:41 AM) Alcohol use Remotely quit alcohol use. Caffeine use Coffee. No drug use Tobacco use Former smoker.  Family History [name] [name], [name]; 11/30/2020 10:41 AM) Cervical Cancer Sister. Heart Disease Father, Mother.  Other Problems [name] [name], [name]; 11/30/2020 10:41 AM) Back Pain Chest pain Home Oxygen Use Myocardial infarction Sleep Apnea     Review of Systems [name] [name]; 11/30/2020 10:41 AM) General Present- Chills and Fatigue. Not Present- Appetite Loss, Fever, Night Sweats, Weight Gain and Weight Loss. Skin Present- Dryness. Not Present- Change in Wart/Mole, Hives, Jaundice, New Lesions, Non-Healing Wounds, Rash and Ulcer. HEENT Not Present- Earache, Hearing Loss, Hoarseness, Nose Bleed, Oral Ulcers, Ringing in the Ears, Seasonal Allergies, Sinus Pain, Sore Throat, Visual Disturbances, Wears glasses/contact lenses and Yellow Eyes. Respiratory Present- Difficulty Breathing. Not Present- Bloody sputum, Chronic Cough, Snoring and Wheezing. Breast Not Present- Breast Mass, Breast Pain, Nipple Discharge and Skin Changes. Cardiovascular Present- Chest Pain. Not Present- Difficulty Breathing Lying Down, Leg Cramps, Palpitations, Rapid Heart Rate, Shortness of Breath and Swelling of Extremities. Gastrointestinal Present- Excessive gas. Not Present- Abdominal Pain, Bloating, Bloody Stool, Change in Bowel Habits, Chronic diarrhea, Constipation, Difficulty Swallowing, Gets full quickly at meals, Hemorrhoids, Indigestion, Nausea, Rectal Pain and Vomiting. Male Genitourinary Present- Frequency. Not Present- Blood in Urine, Change in Urinary Stream, Impotence, Nocturia, Painful Urination, Urgency and Urine Leakage. Musculoskeletal Present- Back Pain. Not Present- Joint Pain, Joint Stiffness, Muscle Pain, Muscle Weakness and  Swelling of Extremities. Neurological Present- Trouble walking. Not Present- Decreased Memory, Fainting, Headaches, Numbness, Seizures, Tingling, Tremor and Weakness. Psychiatric Not Present- Anxiety, Bipolar, Change in Sleep Pattern, Depression, Fearful and Frequent crying. Endocrine Present- Cold Intolerance. Not Present- Excessive  Hunger, Hair Changes, Heat Intolerance, Hot flashes and New Diabetes. Hematology Present- Blood Thinners. Not Present- Easy Bruising, Excessive bleeding, Gland problems, HIV and Persistent Infections.  Vitals [name] [name]; 11/30/2020 10:43 AM) 11/30/2020 10:43 AM Weight: 166 lb Height: 67in Body Surface Area: 1.87 m Body Mass Index: 26 kg/m  Temp.: 97.14F  Pulse: 92 (Regular)  P.OX: 99% (Room air) BP: 120/70(Sitting, Left Arm, Standard)        Physical Exam ([name] [name]; 11/30/2020 12:31 PM)  General Mental Status-Alert. General Appearance-Consistent with stated age. Hydration-Well hydrated. Voice-Normal.  Head and Neck Head-normocephalic, atraumatic with no lesions or palpable masses.  Eye Eyeball - Bilateral-Extraocular movements intact. Sclera/Conjunctiva - Bilateral-No scleral icterus.  Chest and Lung Exam Chest and lung exam reveals -quiet, even and easy respiratory effort with no use of accessory muscles and on auscultation, normal breath sounds, no adventitious sounds and normal vocal resonance. Inspection Chest Wall - Normal. Back - normal.  Cardiovascular Cardiovascular examination reveals -on palpation PMI is normal in location and amplitude, no palpable S3 or S4. Normal cardiac borders., normal heart sounds, regular rate and rhythm with no murmurs, carotid auscultation reveals no bruits and normal pedal pulses bilaterally.  Abdomen Inspection Inspection of the abdomen reveals - No Hernias. Skin - Scar - no surgical scars. Palpation/Percussion Palpation and Percussion of the abdomen  reveal - Soft, Non Tender, No Rebound tenderness, No Rigidity (guarding) and No hepatosplenomegaly. Auscultation Auscultation of the abdomen reveals - Bowel sounds normal.  Neurologic Neurologic evaluation reveals -alert and oriented x 3 with no impairment of recent or remote memory. Mental Status-Normal.  Musculoskeletal Normal Exam - Left-Upper Extremity Strength Normal and Lower Extremity Strength Normal. Normal Exam - Right-Upper Extremity Strength Normal, Lower Extremity Weakness.    Assessment & Plan ([name] Bostwick A. [place] Hedman [name]; 11/30/2020 12:33 PM)  SYMPTOMATIC CHOLELITHIASIS (K80.20) Impression: Preoperative candidate due to significant severe cardiac disease and low ejection fraction. He is to see his cardiologist next week. At this point time, I'm not sure he would tolerate a general anesthetic and cholecystectomy. If he develops cholecystitis a percutaneous drain may be helpful. Once cardiology evaluation we can reassess his actual risk and potentially consider surgery but I explained he is wife he is high risk and has a high possibility of having significant complication and/or death from surgery. I gave him a prescription for tramadol 50 mg by mouth daily 16 for pain since taken this before and seems to help as well as Zofran for nausea. He is not taking a lot of pain medication but does have episodes of pain. Recommend low-fat diet and discuss this with the patient and his wife today. I discussed cholecystectomy and potential risk of ureteral as well The procedure has been discussed with the patient. Risks of laparoscopic cholecystectomy include bleeding, infection, bile duct injury, leak, death, open surgery, diarrhea, other surgery, organ injury, blood vessel injury, DVT, and additional care.   Total time 30 minutes  Current Plans Pt Education - [facility] Free Text Education/Instructions: discussed with patient and provided information. Started traMADol HCl 50 MG Oral Tablet, 1  (one) Tablet four times daily, as needed, #15, 11/30/2020, No Refill. Started Zofran 4 MG Oral Tablet, 1 (one) Tablet four times daily, as needed, #10, 11/30/2020, No Refill.

## 2020-11-30 NOTE — Telephone Encounter (Signed)
° °  [facility] Pre-operative Risk Assessment    HEARTCARE STAFF: - Please ensure there is not already an duplicate clearance open for this procedure. - Under Visit Info/Reason for Call, type in Other and utilize the format Clearance MM/DD/YY or Clearance TBD. Do not use dashes or single digits. - If request is for dental extraction, please clarify the # of teeth to be extracted.  Request for surgical clearance: PT RECENTLY SEEN BY [place] 11/28/20  1. What type of surgery is being performed? GALLBLADDER SURGERY   2. When is this surgery scheduled? TBD   3. What type of clearance is required (medical clearance vs. Pharmacy clearance to hold med vs. Both)? MEDICAL   4. Are there any medications that need to be held prior to surgery and how long? PLAVIX x 5 DAYS PRIOR  5. Practice name and name of physician performing surgery? [facility]; DR. Brantley Stage   6. What is the office phone number? [phone]   7.   What is the office fax number? [place]: [place], CMA  8.   Anesthesia type (None, local, MAC, general) ? GENERAL   Julaine Hua 11/30/2020, 12:22 PM  _________________________________________________________________   (provider comments below)

## 2020-11-30 NOTE — Telephone Encounter (Signed)
° °  Primary Cardiologist: Mertie Moores, MD  Chart reviewed as part of pre-operative protocol coverage.  Patient was seen by Richardson Dopp, PA-C 11/28/20 and was felt to have acute on chronic combined CHF with evidence of volume overload on exam. He was recommended for 1 week follow-up to reassess symptoms, currently scheduled for 12/07/20 with Dr. Acie Fredrickson. Favor addressing preop status at that time.  Pre-op covering staff: - Please contact requesting surgeon's office via preferred method (i.e, phone, fax) to inform them of need for appointment prior to surgery.   Abigail Butts, PA-C  11/30/2020, 4:31 PM

## 2020-11-30 NOTE — Telephone Encounter (Signed)
appt notes have been updated to reflect pre op clearance is needed.. Will forward notes to MD for upcoming appt as well.

## 2020-12-07 ENCOUNTER — Encounter: Payer: Self-pay | Admitting: Cardiovascular Disease

## 2020-12-07 ENCOUNTER — Other Ambulatory Visit: Payer: Self-pay

## 2020-12-07 ENCOUNTER — Ambulatory Visit (INDEPENDENT_AMBULATORY_CARE_PROVIDER_SITE_OTHER): Payer: Medicare Other | Admitting: Cardiovascular Disease

## 2020-12-07 VITALS — BP 104/60 | HR 70 | Ht 67.0 in | Wt 168.0 lb

## 2020-12-07 DIAGNOSIS — I5022 Chronic systolic (congestive) heart failure: Secondary | ICD-10-CM | POA: Diagnosis not present

## 2020-12-07 MED ORDER — FUROSEMIDE 40 MG PO TABS: ORAL_TABLET | ORAL | 3 refills | Status: AC

## 2020-12-07 NOTE — Patient Instructions (Signed)
Medication Instructions:  Your physician has recommended you make the following change in your medication:   Increase Lasix-Take one and a half tablets daily, with an extra tablet in the afternoons on Monday, Wednesday, Friday and Saturday  *If you need a refill on your cardiac medications before your next appointment, please call your pharmacy*   Lab Work: BMET at next visit on 5/3 If you have labs (blood work) drawn today and your tests are completely normal, you will receive your results only by: Marland Kitchen MyChart Message (if you have MyChart) OR . A paper copy in the mail If you have any lab test that is abnormal or we need to change your treatment, we will call you to review the results.   Testing/Procedures: none   Follow-Up: At [facility], you and your health needs are our priority.  As part of our continuing mission to provide you with exceptional heart care, we have created designated Provider Care Teams.  These Care Teams include your primary Cardiologist (physician) and Advanced Practice Providers (APPs -  Physician Assistants and Nurse Practitioners) who all work together to provide you with the care you need, when you need it.   Your next appointment:    as scheduled   The format for your next appointment:   In Person  Provider:   Richardson Dopp, PA-C

## 2020-12-07 NOTE — Progress Notes (Signed)
Cardiology Office Note   Date:  12/07/2020   ID:  [name] Woods., DOB 1929-05-07, MRN [id number]  PCP:  Cassandria Anger, MD  Cardiologist:  Mertie Moores, MD   Problem list 1. Coronary artery disease-status post coronary artery bypass grafting 2. Hyperlipidemia 3. Chronic systolic congestive heart failure - EF 35-40% 4. ICD -  5. OSA - Fransico Him, MD   Chief Complaint  Patient presents with  . Coronary Artery Disease  . Hyperlipidemia      Notes prior to 2016  [name] [name]. is a 87 y.o. male who presents today to follow-up coronary disease and ischemic cardiomyopathy and a history of hypertension. He did have an episode of syncope in the past that was related to hypotension. More recently his blood pressure has been elevated. He was hesitant to have his ramapril dose increased because he associated this with his syncopal episode in the past. Losartan was added to his medications. He is getting excellent control and I have left him on both. He feels good. There is no chest pain. He is fully active. He has an ICD in place that is followed carefully by electrophysiology.  Retired [employer] ( [employer] )   Nov. 1, 2016:  Doing well.  No CP or dyspnea. 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   January 08, 2016:  See for follow up for his CAD / CABG, chronic systolic CHF , HTN Doing well.    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   Stays active  Does not ride anymore.  Has some fatigue , no CP   Jan. 4, 2018:  Still taking care of his horses.   Feeds and hay twice  No CP , no dyspnea.   Aug. 29, 2018:  [name] [name] is doing well from a cardiac standpoint  Has leg pain . 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 25, 2019:  Doing well .  Gets fatigued when he goes down to take care of the horses.  No CP or dyspnea .  Some DOE with working   May 25, 2018: Seen for follow  Still  having some chest pain .   Lasted 30 minutes.    Took NTG with relief. Chest tightness with activity Has known severe CAD  With patent LIMA. Had 2 DES stents to his ramus Int. In 2017.  His symptoms are not nearly as severe as they were in 2017 when he was sent to the hospital and had stents placed in the ramus intermediate branch.  September 03, 2018: [name] [name] is seen today for follow-up of his coronary artery disease He has had an ICD placed for primary prevention. Was having some dyspnea,   Dr. Caryl Comes increased Imdur to 60  Mg a day which has helped.   Has swelling   March 08, 2019 :  I saw [name] [name] as a telemedicine visit on Jan 25, 2019 .  Having more chest pressure with exertion .  Has marked DOE , when dressing  Legs feel heavy ,  Has leg edema Avoids salty foods.  Takes NTG which has helped the chest tightness.  The IMdur has helped.   June 28, 2019:  [name] [name] is seen today for follow-up visit.  He has a history of coronary artery disease and is status post coronary artery bypass grafting.  He has  an ICD placed for primary prevention. Gets DOE with getting dressed and working with the horses.  I reviewed his cardiac catheterization films from 2017.  He has a patent LIMA to the distal LAD.  He has patent circumflex artery.  There is disease in some of the small branches.  That the RCA is occluded.  Saphenous vein grafts are occluded.  Avoids salty foods.   Will increase Imdur to 120 mg BID and will add Ranexa 500 mg PO BID today   December 27, 2019: [name] [name] is an 87 year old gentleman with a history of coronary artery disease. Complains of not sleeping well at night  Complains of lack of energy  No CP ,  No dyspnea,  Just fatigue his VS look good today  He was started on Ranexa about a year ago .  Seems to be helping with his angina  I gave his the OK to have Avastin injections into his eye   Oct. 27, 2021 [name] [name] is seen for follow up of his CAD, CABG, CHF Has some  episodes of chest pressure  Does not use NTG  On Ranexa  Jan. 20, 2022  [name] [name] is seen today for follow up visit for his CAD  Having more chest discomfort / chest pressure  Now gets out of breath with minimal exertion - has even while getting dressed.  NTG seems to help He is on Ranexa  Has lots of congestion in his chest  Echocardiogram from June, 2020 reveals moderate to severe LV dysfunction with ejection fraction of 30 to 35%.  He has grade 2 diastolic dysfunction. He takes lasix 60 mg a day scheduled  He has been hesitant to increse his lasix because of his CKD.   Feb. 28, 2022: [name] [name] is seen today for follow up of his CHF, CAD He has significant CKD ,  Creatinine 2 weeks ago was 2.0 Who is very short of breath at his last visit.  We had him increase his furosemide to 60 mg twice a day and increase his potassium to 10 mEq twice a day.  His blood pressure fell and he was seen by Truitt Merle, NP on February 22.  His blood pressure was 88-82 systolic. His amlodipine was stopped.  He also stopped his Avapro. Feeling better now that his BP is better.  Breathing is better . No significant CP Is on Ranexa  Is having gall bladder surgery in 2 weeks .    December 07, 2020 [name] [name] is seen today for pre op visit for gall bladder surgery  He has had worsening CHF as well as hypotension He was seen by Richardson Dopp, PA last week with BP log - many readings in the 90s Noted worsening shortness of breath His coreg dose was reduced and lasix was increased Still has significant  DOE with any activity  Is taking lasix  60 mg each morning.  Scott had him take an extra 40 mg in the afternoons and he felt well.  BP was on the low side but he had no syncope .     Past Medical History:  Diagnosis Date  . AICD (automatic cardioverter/defibrillator) present   . Arrhythmia   . Arthritis    "hands, legs" (03/26/2016)  . CAD (coronary artery disease)    hx apical aneurysm/cath 05/2005,  old occluded graft to RCA, no change/ cath 02/2007 no change, myoview 06/2009 old scar, no ischemia EF 43%, EF 35-40%-echo 05/2008 [facility] periapical wall //  LHC 7/17: mLAD 100, oRI 99, pRCA 100, L-LAD ok, S-RI 100, S-RCA 100, EF 35-45% >> PCI: DES x 2 to RI  . Chronic systolic CHF (congestive heart failure) ([place])    a. Echo 9/14: Mild LVH, mild focal basal septal hypertrophy, EF 35-40%, anteroseptal HK, normal diastolic function, mild MR, mild LAE, PASP 34 mmHg  . Chronotropic incompetence    treated w/pacemaker, ICD 7.2008. This was placed after CPX done post 2008 cath. CPX showed only chronotropic incompetence  . History of blood transfusion 1969   "after getting MSO4; it destroys my corpuscles" (03/26/2016)  . History of colon polyps   . Hyperlipidemia   . Hypertension   . Migraine    "just before my bypass" (03/26/2016)  . Mitral regurgitation    mild  . Myocardial infarction ([place]) "several"  . Numbness and tingling of left arm and leg    improved after tx w/prednisone  . OSA (obstructive sleep apnea) 09/01/2016   Severe OSA with AHI 38/hr now on BiPAP at 21/17cm H2O  . Syncopal episodes    relative hypotension    Past Surgical History:  Procedure Laterality Date  . BACK SURGERY    . CARDIAC CATHETERIZATION  1980s-1990s X 4  . CARDIAC CATHETERIZATION N/A 03/26/2016   Procedure: Left Heart Cath and Cors/Grafts Angiography;  Surgeon: Burnell Blanks, MD;  Location: [facility];  Service: Cardiovascular;  Laterality: N/A;  . CARDIAC CATHETERIZATION N/A 03/26/2016   Procedure: Coronary Stent Intervention;  Surgeon: Burnell Blanks, MD;  Location: [facility];  Service: Cardiovascular;  Laterality: N/A;  . CARDIAC DEFIBRILLATOR PLACEMENT  2008  . CATARACT EXTRACTION W/ INTRAOCULAR LENS  IMPLANT, BILATERAL Bilateral 08/2013 - 09/2013   right - left   . CORONARY ANGIOPLASTY WITH STENT PLACEMENT  ~ 1992; 03/26/2016   "1 + 2"  . CORONARY ARTERY BYPASS GRAFT  1992    CABG "X4"  . CYSTOSCOPY  1980s  . GAS/FLUID EXCHANGE Right 06/18/2017   Procedure: GAS/FLUID EXCHANGE (C3F8);  Surgeon: Sherlynn Stalls, MD;  Location: [facility];  Service: Ophthalmology;  Laterality: Right;  . IMPLANTABLE CARDIOVERTER DEFIBRILLATOR (ICD) GENERATOR CHANGE N/A 06/21/2014   Procedure: ICD GENERATOR CHANGE;  Surgeon: Deboraha Sprang, MD;  Location[facility];  Service: Cardiovascular;  Laterality: N/A;  . PARS PLANA VITRECTOMY Right 06/18/2017   Procedure: PARS PLANA VITRECTOMY WITH 25 GAUGE;  Surgeon: Sherlynn Stalls, MD;  Location: [facility];  Service: Ophthalmology;  Laterality: Right;  . PHOTOCOAGULATION WITH LASER Right 06/18/2017   Procedure: PHOTOCOAGULATION WITH LASER- ENDO LASER;  Surgeon: Sherlynn Stalls, MD;  Location: [facility];  Service: Ophthalmology;  Laterality: Right;  . POSTERIOR LUMBAR FUSION  ~ 1989   "fused 2 discs"  . TIBIA FRACTURE SURGERY Left 1943   "put artificial bone in there"    Patient Active Problem List   Diagnosis Date Noted  . Chronic systolic CHF (congestive heart failure) ([place]) 11/18/2013    Priority: High  . CAD (coronary artery disease)     Priority: High  . Flank pain 10/31/2020  . Nausea & vomiting 10/31/2020  . Insomnia 01/05/2020  . Cat bite 07/20/2019  . DOE (dyspnea on exertion) 05/31/2019  . Edema 05/31/2019  . Varicose veins of both lower extremities without ulcer or inflammation 02/02/2018  . Allergic rhinitis 12/29/2017  . HLD (hyperlipidemia) 11/09/2017  . Left lateral epicondylitis 07/02/2017  . Knee pain, right 12/24/2016  . OSA (obstructive sleep apnea) 09/01/2016  . Status post coronary  artery stent placement   . Hypertensive heart disease with heart failure ([place])   . Unstable angina ([place])   . Thumb pain 05/04/2015  . Ejection fraction   . Well adult exam 07/13/2012  . Chronic renal insufficiency, stage III (moderate) ([place]) 07/21/2011  . Ischemic cardiomyopathy   . Hx of CABG   . Chronotropic incompetence   . Syncopal  episodes   . Mitral regurgitation   . Numbness and tingling of left arm and leg   . Arm pain, diffuse, left 10/22/2009  . PARESTHESIA 10/22/2009  . TOBACCO USE, QUIT 10/22/2009  . SEBACEOUS CYST 08/27/2009  . Cough 02/28/2009  . Anemia, chronic disease 02/18/2008  . Dyslipidemia 05/03/2007  . Hypertensive heart disease 05/03/2007  . COLONIC POLYPS, HX OF 05/03/2007  . Automatic implantable cardioverter-defibrillator in situ 03/16/2007      Current Outpatient Medications  Medication Sig Dispense Refill  . Acetaminophen-Codeine (TYLENOL/CODEINE #3) 300-30 MG tablet Take 0.5-1 tablets by mouth every 4 (four) hours as needed for pain. 20 tablet 1  . atorvastatin (LIPITOR) 40 MG tablet TAKE 1 TABLET DAILY 90 tablet 3  . carvedilol (COREG) 12.5 MG tablet Take one half tablet (6.25 mg) two times daily by mouth. 90 tablet 3  . Cholecalciferol 1000 UNITS tablet Take 1,000 Units by mouth daily.    . clopidogrel (PLAVIX) 75 MG tablet TAKE 1 TABLET DAILY 90 tablet 2  . furosemide (LASIX) 40 MG tablet TAKE ONE AND ONE-HALF TABLETS IN THE MORNING AND 1 TABLET IN THE EVENING 225 tablet 3  . isosorbide mononitrate (IMDUR) 120 MG 24 hr tablet TAKE 1 TABLET DAILY 90 tablet 2  . Multiple Vitamins-Minerals (PRESERVISION AREDS 2) CAPS Take 1 capsule by mouth daily.     . nitroGLYCERIN (NITROSTAT) 0.4 MG SL tablet Place 1 tablet (0.4 mg total) under the tongue every 5 (five) minutes as needed for chest pain. 25 tablet 0  . ondansetron (ZOFRAN) 4 MG tablet Take 1 tablet (4 mg total) by mouth every 8 (eight) hours as needed for nausea or vomiting. 20 tablet 0  . potassium chloride (KLOR-CON) 10 MEQ tablet TAKE 1 TABLET DAILY 90 tablet 3  . ranolazine (RANEXA) 1000 MG SR tablet TAKE 1 TABLET TWICE A DAY 180 tablet 3  . traZODone (DESYREL) 50 MG tablet Take 0.5-1 tablets (25-50 mg total) by mouth at bedtime as needed for sleep. 30 tablet 5   No current facility-administered medications for this visit.     Allergies:   Morphine and Ramipril    Social History:  The patient  reports that he has quit smoking. His smoking use included cigars. He quit after 29.00 years of use. He has never used smokeless tobacco. He reports previous alcohol use. He reports that he does not use drugs.   Family History:  The patient's family history includes Colon cancer in an other family member; Hypertension in his father, mother, and another family member.    ROS: Noted in current history, otherwise review of systems is negative.   Physical Exam: There were no vitals taken for this visit.  GEN:  Elderly , frail man HEENT: Normal NECK: No JVD; No carotid bruits LYMPHATICS: No lymphadenopathy CARDIAC: RRR , no murmurs, rubs, gallops RESPIRATORY:  Clear to auscultation without rales, wheezing or rhonchi  ABDOMEN:mild ascites.  MUSCULOSKELETAL:   2-3 + pitting edema  SKIN: Warm and dry NEUROLOGIC:  Alert and oriented x 3    EKG:  Recent Labs: 05/22/2020: NT-Pro BNP 5,067 10/31/2020: ALT 12 11/28/2020: BUN 37; Creatinine, Ser 1.82; Hemoglobin 9.3; Platelets 252; Potassium 5.0; Sodium 136    Lipid Panel    Component Value Date/Time   CHOL 88 (L) 12/27/2019 1029   TRIG 117 12/27/2019 1029   HDL 35 (L) 12/27/2019 1029   CHOLHDL 2.5 12/27/2019 1029   CHOLHDL 4 12/24/2016 1103   VLDL 41.8 (H) 12/24/2016 1103   LDLCALC 32 12/27/2019 1029   LDLDIRECT 35.0 12/24/2016 1103      Wt Readings from Last 3 Encounters:  11/28/20 167 lb 9.6 oz (76 kg)  11/12/20 162 lb 12.8 oz (73.8 kg)  11/06/20 165 lb 6.4 oz (75 kg)      Current medicines are reviewed  The patient understands his medications.   ASSESSMENT AND PLAN:  1. Coronary artery disease-   No angina    2. Hyperlipidemia -       3. Chronic systolic congestive heart failure -   .  he continues to have issues with CHF and his CKD.   Its has been very difficult to get him to a point where his volume is ok and his BP / creatinine  are good At this point, I think we should concentrate on getting him to feel better, even if it means a slightly higher creatinine . He is only taking Lasix 60 mg a day .  Will have him add an extra Lasix 40 mg in the afternoons on M,W,F,S to see if that helps   He has an appt with Nicki Reaper on May 3.  With BMP  4. ICD -   5. Obstructive sleep apnea -   6.  Gall stones:   Needs to have surgery .   He is at moderate risk for CV complications from the surgery .      Mertie Moores, MD  12/07/2020 6:27 AM    [address] [place],  [place] [place], [place]  [address] Pager [phone] Phone: [phone]; Fax: [phone]

## 2020-12-13 DIAGNOSIS — G4733 Obstructive sleep apnea (adult) (pediatric): Secondary | ICD-10-CM | POA: Diagnosis not present

## 2020-12-15 ENCOUNTER — Other Ambulatory Visit: Payer: Self-pay | Admitting: Internal Medicine

## 2020-12-17 NOTE — Telephone Encounter (Signed)
Please advise  Patient is requesting a refill of the following medications: Requested Prescriptions   Pending Prescriptions Disp Refills  . acetaminophen-codeine (TYLENOL #3) 300-30 MG tablet [Pharmacy Med Name: acetaminophen 300 mg-codeine 30 mg tablet] 20 tablet 1    Sig: TAKE ONE-HALF TO 1 TABLET BY MOUTH EVERY 4 HOURS AS NEEDED FOR PAIN    Date of patient request: 12/15/20 Last office visit: 10/31/20 Date of last refill: 10/31/20 Last refill amount: 20 tab, 1 refill Follow up time period per chart: 12/26/20

## 2020-12-25 ENCOUNTER — Other Ambulatory Visit: Payer: Self-pay

## 2020-12-26 ENCOUNTER — Encounter: Payer: Self-pay | Admitting: Internal Medicine

## 2020-12-26 ENCOUNTER — Ambulatory Visit (INDEPENDENT_AMBULATORY_CARE_PROVIDER_SITE_OTHER): Payer: Medicare Other

## 2020-12-26 ENCOUNTER — Ambulatory Visit (INDEPENDENT_AMBULATORY_CARE_PROVIDER_SITE_OTHER): Payer: Medicare Other | Admitting: Internal Medicine

## 2020-12-26 VITALS — BP 120/80 | HR 77 | Temp 98.1°F | Ht 67.0 in | Wt 168.0 lb

## 2020-12-26 DIAGNOSIS — Z0000 Encounter for general adult medical examination without abnormal findings: Secondary | ICD-10-CM | POA: Diagnosis not present

## 2020-12-26 DIAGNOSIS — I25119 Atherosclerotic heart disease of native coronary artery with unspecified angina pectoris: Secondary | ICD-10-CM

## 2020-12-26 DIAGNOSIS — I5022 Chronic systolic (congestive) heart failure: Secondary | ICD-10-CM | POA: Diagnosis not present

## 2020-12-26 DIAGNOSIS — K8012 Calculus of gallbladder with acute and chronic cholecystitis without obstruction: Secondary | ICD-10-CM | POA: Diagnosis not present

## 2020-12-26 DIAGNOSIS — R0600 Dyspnea, unspecified: Secondary | ICD-10-CM

## 2020-12-26 DIAGNOSIS — K8020 Calculus of gallbladder without cholecystitis without obstruction: Secondary | ICD-10-CM | POA: Insufficient documentation

## 2020-12-26 DIAGNOSIS — R112 Nausea with vomiting, unspecified: Secondary | ICD-10-CM

## 2020-12-26 DIAGNOSIS — N1832 Chronic kidney disease, stage 3b: Secondary | ICD-10-CM | POA: Diagnosis not present

## 2020-12-26 DIAGNOSIS — R0609 Other forms of dyspnea: Secondary | ICD-10-CM

## 2020-12-26 LAB — COMPREHENSIVE METABOLIC PANEL
ALT: 17 U/L (ref 0–53)
AST: 16 U/L (ref 0–37)
Albumin: 3.8 g/dL (ref 3.5–5.2)
Alkaline Phosphatase: 172 U/L — ABNORMAL HIGH (ref 39–117)
BUN: 36 mg/dL — ABNORMAL HIGH (ref 6–23)
CO2: 27 mEq/L (ref 19–32)
Calcium: 9.2 mg/dL (ref 8.4–10.5)
Chloride: 98 mEq/L (ref 96–112)
Creatinine, Ser: 1.83 mg/dL — ABNORMAL HIGH (ref 0.40–1.50)
GFR: 32.36 mL/min — ABNORMAL LOW (ref >60.00)
Glucose, Bld: 100 mg/dL — ABNORMAL HIGH (ref 70–99)
Potassium: 4.6 mEq/L (ref 3.5–5.1)
Sodium: 134 mEq/L — ABNORMAL LOW (ref 135–145)
Total Bilirubin: 1.4 mg/dL — ABNORMAL HIGH (ref 0.2–1.2)
Total Protein: 7 g/dL (ref 6.0–8.3)

## 2020-12-26 LAB — CBC WITH DIFFERENTIAL/PLATELET
Basophils Absolute: 0.1 10*3/uL (ref 0.0–0.1)
Basophils Relative: 0.9 % (ref 0.0–3.0)
Eosinophils Absolute: 0.1 10*3/uL (ref 0.0–0.7)
Eosinophils Relative: 1.7 % (ref 0.0–5.0)
HCT: 30.5 % — ABNORMAL LOW (ref 39.0–52.0)
Hemoglobin: 9.9 g/dL — ABNORMAL LOW (ref 13.0–17.0)
Lymphocytes Relative: 8.4 % — ABNORMAL LOW (ref 12.0–46.0)
Lymphs Abs: 0.6 10*3/uL — ABNORMAL LOW (ref 0.7–4.0)
MCHC: 32.5 g/dL (ref 30.0–36.0)
MCV: 78.4 fl (ref 78.0–100.0)
Monocytes Absolute: 0.8 10*3/uL (ref 0.1–1.0)
Monocytes Relative: 11.9 % (ref 3.0–12.0)
Neutro Abs: 5.1 10*3/uL (ref 1.4–7.7)
Neutrophils Relative %: 77.1 % — ABNORMAL HIGH (ref 43.0–77.0)
Platelets: 259 10*3/uL (ref 150.0–400.0)
RBC: 3.89 Mil/uL — ABNORMAL LOW (ref 4.22–5.81)
RDW: 18.3 % — ABNORMAL HIGH (ref 11.5–15.5)
WBC: 6.7 10*3/uL (ref 4.0–10.5)

## 2020-12-26 MED ORDER — AMOXICILLIN-POT CLAVULANATE 875-125 MG PO TABS: 1.0000 | ORAL_TABLET | Freq: Two times a day (BID) | ORAL | 0 refills | Status: AC

## 2020-12-26 MED ORDER — GLYCOPYRROLATE 2 MG PO TABS: 2.0000 mg | ORAL_TABLET | Freq: Three times a day (TID) | ORAL | 1 refills | Status: AC | PRN

## 2020-12-26 NOTE — Patient Instructions (Signed)
[name] [name] , Thank you for taking time to come for your Medicare Wellness Visit. I appreciate your ongoing commitment to your health goals. Please review the following plan we discussed and let me know if I can assist you in the future.   Screening recommendations/referrals: Colonoscopy: not a candidate for colon cancer screening due to age Recommended yearly ophthalmology/optometry visit for glaucoma screening and checkup Recommended yearly dental visit for hygiene and checkup  Vaccinations: Influenza vaccine: 06/15/2020 Pneumococcal vaccine: 09/15/2005, 09/24/2017 Tdap vaccine: 04/18/2014; due every 10 years Shingles vaccine: never done; Please call your insurance company to determine your out of pocket expense for the Shingrix vaccine. You may receive this vaccine at your local pharmacy.   Covid-19: 10/27/2019, 11/25/2019, 08/04/2020  Advanced directives: Please bring a copy of your health care power of attorney and living will to the office at your convenience.  Conditions/risks identified: Yes. Reviewed health maintenance screenings with patient today and relevant education, vaccines, and/or referrals were provided. Continue doing brain stimulating activities (puzzles, reading, adult coloring books, staying active) to keep memory sharp. Continue to eat heart healthy diet (full of fruits, vegetables, whole grains, lean protein, water--limit salt, fat, and sugar intake) and increase physical activity as tolerated.  Next appointment: Please schedule your next Medicare Wellness Visit with your Nurse Health Advisor in 1 year by calling [phone].   Preventive Care 85 Years and Older, Male Preventive care refers to lifestyle choices and visits with your health care provider that can promote health and wellness. What does preventive care include?  A yearly physical exam. This is also called an annual well check.  Dental exams once or twice a year.  Routine eye exams. Ask your health care  provider how often you should have your eyes checked.  Personal lifestyle choices, including:  Daily care of your teeth and gums.  Regular physical activity.  Eating a healthy diet.  Avoiding tobacco and drug use.  Limiting alcohol use.  Practicing safe sex.  Taking low doses of aspirin every day.  Taking vitamin and mineral supplements as recommended by your health care provider. What happens during an annual well check? The services and screenings done by your health care provider during your annual well check will depend on your age, overall health, lifestyle risk factors, and family history of disease. Counseling  Your health care provider may ask you questions about your:  Alcohol use.  Tobacco use.  Drug use.  Emotional well-being.  Home and relationship well-being.  Sexual activity.  Eating habits.  History of falls.  Memory and ability to understand (cognition).  Work and work Statistician. Screening  You may have the following tests or measurements:  Height, weight, and BMI.  Blood pressure.  Lipid and cholesterol levels. These may be checked every 5 years, or more frequently if you are over 37 years old.  Skin check.  Lung cancer screening. You may have this screening every year starting at age 66 if you have a 30-pack-year history of smoking and currently smoke or have quit within the past 15 years.  Fecal occult blood test (FOBT) of the stool. You may have this test every year starting at age 22.  Flexible sigmoidoscopy or colonoscopy. You may have a sigmoidoscopy every 5 years or a colonoscopy every 10 years starting at age 81.  Prostate cancer screening. Recommendations will vary depending on your family history and other risks.  Hepatitis C blood test.  Hepatitis B blood test.  Sexually transmitted disease (STD) testing.  Diabetes screening. This is done by checking your blood sugar (glucose) after you have not eaten for a while  (fasting). You may have this done every 1-3 years.  Abdominal aortic aneurysm (AAA) screening. You may need this if you are a current or former smoker.  Osteoporosis. You may be screened starting at age 61 if you are at high risk. Talk with your health care provider about your test results, treatment options, and if necessary, the need for more tests. Vaccines  Your health care provider may recommend certain vaccines, such as:  Influenza vaccine. This is recommended every year.  Tetanus, diphtheria, and acellular pertussis (Tdap, Td) vaccine. You may need a Td booster every 10 years.  Zoster vaccine. You may need this after age 53.  Pneumococcal 13-valent conjugate (PCV13) vaccine. One dose is recommended after age 28.  Pneumococcal polysaccharide (PPSV23) vaccine. One dose is recommended after age 1. Talk to your health care provider about which screenings and vaccines you need and how often you need them. This information is not intended to replace advice given to you by your health care provider. Make sure you discuss any questions you have with your health care provider. Document Released: 09/28/2015 Document Revised: 05/21/2016 Document Reviewed: 07/03/2015 Elsevier Interactive Patient Education  2017 [employer] Prevention in the Home Falls can cause injuries. They can happen to people of all ages. There are many things you can do to make your home safe and to help prevent falls. What can I do on the outside of my home?  Regularly fix the edges of walkways and driveways and fix any cracks.  Remove anything that might make you trip as you walk through a door, such as a raised step or threshold.  Trim any bushes or trees on the path to your home.  Use bright outdoor lighting.  Clear any walking paths of anything that might make someone trip, such as rocks or tools.  Regularly check to see if handrails are loose or broken. Make sure that both sides of any steps have  handrails.  Any raised decks and porches should have guardrails on the edges.  Have any leaves, snow, or ice cleared regularly.  Use sand or salt on walking paths during winter.  Clean up any spills in your garage right away. This includes oil or grease spills. What can I do in the bathroom?  Use night lights.  Install grab bars by the toilet and in the tub and shower. Do not use towel bars as grab bars.  Use non-skid mats or decals in the tub or shower.  If you need to sit down in the shower, use a plastic, non-slip stool.  Keep the floor dry. Clean up any water that spills on the floor as soon as it happens.  Remove soap buildup in the tub or shower regularly.  Attach bath mats securely with double-sided non-slip rug tape.  Do not have throw rugs and other things on the floor that can make you trip. What can I do in the bedroom?  Use night lights.  Make sure that you have a light by your bed that is easy to reach.  Do not use any sheets or blankets that are too big for your bed. They should not hang down onto the floor.  Have a firm chair that has side arms. You can use this for support while you get dressed.  Do not have throw rugs and other things on the floor that can  make you trip. What can I do in the kitchen?  Clean up any spills right away.  Avoid walking on wet floors.  Keep items that you use a lot in easy-to-reach places.  If you need to reach something above you, use a strong step stool that has a grab bar.  Keep electrical cords out of the way.  Do not use floor polish or wax that makes floors slippery. If you must use wax, use non-skid floor wax.  Do not have throw rugs and other things on the floor that can make you trip. What can I do with my stairs?  Do not leave any items on the stairs.  Make sure that there are handrails on both sides of the stairs and use them. Fix handrails that are broken or loose. Make sure that handrails are as long as  the stairways.  Check any carpeting to make sure that it is firmly attached to the stairs. Fix any carpet that is loose or worn.  Avoid having throw rugs at the top or bottom of the stairs. If you do have throw rugs, attach them to the floor with carpet tape.  Make sure that you have a light switch at the top of the stairs and the bottom of the stairs. If you do not have them, ask someone to add them for you. What else can I do to help prevent falls?  Wear shoes that:  Do not have high heels.  Have rubber bottoms.  Are comfortable and fit you well.  Are closed at the toe. Do not wear sandals.  If you use a stepladder:  Make sure that it is fully opened. Do not climb a closed stepladder.  Make sure that both sides of the stepladder are locked into place.  Ask someone to hold it for you, if possible.  Clearly mark and make sure that you can see:  Any grab bars or handrails.  First and last steps.  Where the edge of each step is.  Use tools that help you move around (mobility aids) if they are needed. These include:  Canes.  Walkers.  Scooters.  Crutches.  Turn on the lights when you go into a dark area. Replace any light bulbs as soon as they burn out.  Set up your furniture so you have a clear path. Avoid moving your furniture around.  If any of your floors are uneven, fix them.  If there are any pets around you, be aware of where they are.  Review your medicines with your doctor. Some medicines can make you feel dizzy. This can increase your chance of falling. Ask your doctor what other things that you can do to help prevent falls. This information is not intended to replace advice given to you by your health care provider. Make sure you discuss any questions you have with your health care provider. Document Released: 06/28/2009 Document Revised: 02/07/2016 Document Reviewed: 10/06/2014 Elsevier Interactive Patient Education  2017 [employer].

## 2020-12-26 NOTE — Progress Notes (Addendum)
Subjective:   [name] [name]. is a 87 y.o. male who presents for Medicare Annual/Subsequent preventive examination.  Review of Systems    No ROS. Medicare Wellness Visit. Additional risk factors are reflected in social history. Cardiac Risk Factors include: advanced age (>5men, >72 women);dyslipidemia;family history of premature cardiovascular disease;hypertension;male gender     Objective:    Today's Vitals   12/26/20 0950 12/26/20 1020  BP: 120/80   Pulse: 77   Temp: 98.1 F (36.7 C)   SpO2: 98%   Weight: 168 lb (76.2 kg)   Height: 5\' 7"  (1.702 m)   PainSc:  2    Body mass index is 26.31 kg/m.  Advanced Directives 12/26/2020 07/16/2019 02/04/2019 11/25/2017 06/26/2016 05/11/2016 05/03/2016  Does Patient Have a Medical Advance Directive? Yes Yes Yes Yes Yes Yes Yes  Type of Advance Directive Living will;Healthcare Power of [place];Living will [place];Living will [place];Living will [place];Living will;Advance instruction for mental health treatment [place];Living will;Advance instruction for mental health treatment [place];Living will  Does patient want to make changes to medical advance directive? No - Patient declined - - - No - Patient declined No - Patient declined -  Copy of [place] in Chart? No - copy requested No - copy requested No - copy requested - No - copy requested No - copy requested -    Current Medications (verified) Outpatient Encounter Medications as of 12/26/2020  Medication Sig   acetaminophen-codeine (TYLENOL #3) 300-30 MG tablet TAKE ONE-HALF TO 1 TABLET BY MOUTH EVERY 4 HOURS AS NEEDED FOR PAIN   atorvastatin (LIPITOR) 40 MG tablet TAKE 1 TABLET DAILY   carvedilol (COREG) 12.5 MG tablet Take one half tablet (6.25 mg) two times daily by mouth.   Cholecalciferol 1000 UNITS tablet Take 1,000 Units by  mouth daily.   clopidogrel (PLAVIX) 75 MG tablet TAKE 1 TABLET DAILY   furosemide (LASIX) 40 MG tablet Take one and a half tablets daily(60mg ), with an extra tablet(40mg ) in the afternoons on Monday, Wednesday, Friday and Saturday   isosorbide mononitrate (IMDUR) 120 MG 24 hr tablet TAKE 1 TABLET DAILY   Multiple Vitamins-Minerals (PRESERVISION AREDS 2) CAPS Take 1 capsule by mouth daily.    nitroGLYCERIN (NITROSTAT) 0.4 MG SL tablet Place 1 tablet (0.4 mg total) under the tongue every 5 (five) minutes as needed for chest pain.   ondansetron (ZOFRAN) 4 MG tablet Take 1 tablet (4 mg total) by mouth every 8 (eight) hours as needed for nausea or vomiting.   potassium chloride (KLOR-CON) 10 MEQ tablet TAKE 1 TABLET DAILY   ranolazine (RANEXA) 1000 MG SR tablet TAKE 1 TABLET TWICE A DAY   traMADol (ULTRAM) 50 MG tablet Take 50 mg by mouth 4 (four) times daily as needed.   traZODone (DESYREL) 50 MG tablet Take 0.5-1 tablets (25-50 mg total) by mouth at bedtime as needed for sleep.   No facility-administered encounter medications on file as of 12/26/2020.    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 Echo 9/14: Mild LVH, mild focal basal septal hypertrophy, EF 35-40%, anteroseptal HK, normal diastolic function, mild MR, mild LAE, PASP 34 mmHg   Chronotropic incompetence    treated w/pacemaker, ICD 7.2008. This was placed after CPX done post 2008 cath. CPX  showed only chronotropic incompetence   History of blood transfusion 1969   "after getting MSO4; it destroys my corpuscles" (03/26/2016)   History of colon polyps    Hyperlipidemia    Hypertension    Migraine    "just before my bypass" (03/26/2016)   Mitral regurgitation    mild   Myocardial infarction [facility]) "several"   Numbness and tingling of left arm and leg    improved after tx w/prednisone   OSA (obstructive sleep apnea) 09/01/2016   Severe OSA with AHI 38/hr now on BiPAP at 21/17cm H2O   Syncopal episodes    relative hypotension   Past Surgical History:  Procedure Laterality Date   BACK SURGERY     CARDIAC CATHETERIZATION  1980s-1990s X 4   CARDIAC CATHETERIZATION N/A 03/26/2016   Procedure: Left Heart Cath and Cors/Grafts Angiography;  Surgeon: Burnell Blanks, MD;  Location: [place] [facility];  Service: Cardiovascular;  Laterality: N/A;   CARDIAC CATHETERIZATION N/A 03/26/2016   Procedure: Coronary Stent Intervention;  Surgeon: Burnell Blanks, MD;  Location: [place] [facility];  Service: Cardiovascular;  Laterality: N/A;   CARDIAC DEFIBRILLATOR PLACEMENT  2008   CATARACT EXTRACTION W/ INTRAOCULAR LENS  IMPLANT, BILATERAL Bilateral 08/2013 - 09/2013   right - left    CORONARY ANGIOPLASTY WITH STENT PLACEMENT  ~ 1992; 03/26/2016   "1 + 2"   CORONARY ARTERY BYPASS GRAFT  1992   CABG "X4"   CYSTOSCOPY  1980s   GAS/FLUID EXCHANGE Right 06/18/2017   Procedure: GAS/FLUID EXCHANGE (C3F8);  Surgeon: Sherlynn Stalls, MD;  Location: [place];  Service: Ophthalmology;  Laterality: Right;   IMPLANTABLE CARDIOVERTER DEFIBRILLATOR (ICD) GENERATOR CHANGE N/A 06/21/2014   Procedure: ICD GENERATOR CHANGE;  Surgeon: Deboraha Sprang, MD;  Location: [facility];  Service: Cardiovascular;  Laterality: N/A;   PARS PLANA VITRECTOMY Right 06/18/2017   Procedure: PARS PLANA VITRECTOMY WITH 25 GAUGE;  Surgeon: Sherlynn Stalls, MD;  Location: [place];  Service: Ophthalmology;  Laterality: Right;    PHOTOCOAGULATION WITH LASER Right 06/18/2017   Procedure: PHOTOCOAGULATION WITH LASER- ENDO LASER;  Surgeon: Sherlynn Stalls, MD;  Location: [place];  Service: Ophthalmology;  Laterality: Right;   POSTERIOR LUMBAR FUSION  ~ 1989   "fused 2 discs"   TIBIA FRACTURE SURGERY Left 1943   "put artificial bone in there"   Family History  Problem Relation Age of Onset   Hypertension Mother    Hypertension Father    Hypertension Other    Colon cancer Other        1st degree relative <50   Social History   Socioeconomic History   Marital status: Married    Spouse name: 1 child passed away   Number of children: 4   Years of education: Not on file   Highest education level: Not on file  Occupational History   Occupation: Retired  Tobacco Use   Smoking status: Former Smoker    Years: 29.00    Types: Cigars   Smokeless tobacco: Never Used  Tobacco comment: quit smoking cigars in 1973  Vaping Use   Vaping Use: Never used  Substance and Sexual Activity   Alcohol use: Not Currently    Comment: "quit drinking in 1973"   Drug use: No   Sexual activity: Yes  Other Topics Concern   Not on file  Social History Narrative   Widowed - 06/2010   Social Determinants of Health   Financial Resource Strain: Low Risk    Difficulty of Paying Living Expenses: Not hard at all  Food Insecurity: No Food Insecurity   Worried About Charity fundraiser in the Last Year: Never true   [place] in the Last Year: Never true  Transportation Needs: No Transportation Needs   Lack of Transportation (Medical): No   Lack of Transportation (Non-Medical): No  Physical Activity: Inactive   Days of Exercise per Week: 0 days   Minutes of Exercise per Session: 0 min  Stress: No Stress Concern Present   Feeling of Stress : Not at all  Social Connections: Socially Integrated   Frequency of Communication with Friends and Family: More than three times a week   Frequency of Social Gatherings with Friends and  Family: More than three times a week   Attends Religious Services: More than 4 times per year   Active Member of [employer] or Organizations: No   Attends Music therapist: More than 4 times per year   Marital Status: Married    Tobacco Counseling Counseling given: Not Answered Comment: quit smoking cigars in 1973   Clinical Intake:  Pre-visit preparation completed: Yes  Pain : 0-10 Pain Score: 2  Pain Type: Acute pain Pain Location: Back Pain Radiating Towards: Patient has gallstone Pain Descriptors / Indicators: Throbbing,Discomfort,Pressure,Radiating Pain Onset: 1 to 4 weeks ago Pain Frequency: Intermittent Pain Relieving Factors: Tramadol, Tylenol #3 Effect of Pain on Daily Activities: Pain produces disability and affects the quality of life.  Pain Relieving Factors: Tramadol, Tylenol #3  BMI - recorded: 26.31 Nutritional Status: BMI 25 -29 Overweight Nutritional Risks: None Diabetes: No  How often do you need to have someone help you when you read instructions, pamphlets, or other written materials from your doctor or pharmacy?: 1 - Never What is the last grade level you completed in school?: GED: Retired [employer]  Diabetic? no  Interpreter Needed?: No  Information entered by :: Lisette Abu, LPN   Activities of Daily Living In your present state of health, do you have any difficulty performing the following activities: 12/26/2020  Hearing? N  Vision? N  Difficulty concentrating or making decisions? N  Walking or climbing stairs? N  Dressing or bathing? N  Doing errands, shopping? N  Preparing Food and eating ? N  Using the Toilet? N  In the past six months, have you accidently leaked urine? N  Do you have problems with loss of bowel control? N  Managing your Medications? N  Managing your Finances? N  Housekeeping or managing your Housekeeping? N  Some recent data might be hidden    Patient Care Team: Plotnikov, Evie Lacks, MD as PCP -  General (Internal Medicine) Nahser, Wonda Cheng, MD as PCP - Cardiology (Cardiology) Deboraha Sprang, MD as PCP - Electrophysiology (Cardiology) Sueanne Margarita, MD as PCP - Sleep Medicine (Cardiology) Sharmon Revere as Physician Assistant (Cardiology)  Indicate any recent Medical Services you may have received from other than [facility] providers in the past year (date may be approximate).  Assessment:   This is a routine wellness examination for Dylan.  Hearing/Vision screen No exam data present  Dietary issues and exercise activities discussed: Current Exercise Habits: The patient does not participate in regular exercise at present, Exercise limited by: cardiac condition(s);respiratory conditions(s)  Goals       Patient Stated      Maintain current health status, enjoy traveling when I can.      Patient Stated (pt-stated)      I want to get back to my independence. 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       Depression Screen PHQ 2/9 Scores 12/26/2020 04/05/2020 02/04/2019 02/02/2018 12/24/2016 06/24/2016 05/04/2015  PHQ - 2 Score 2 0 0 0 0 0 0    Fall Risk Fall Risk  12/26/2020 04/05/2020 02/04/2019 02/02/2018 12/24/2016  Falls in the past year? 0 0 0 No No  Number falls in past yr: 0 0 0 - -  Injury with Fall? 0 0 - - -  Risk for fall due to : No Fall Risks - - - -  Follow up Falls evaluation completed - - - -    FALL RISK PREVENTION PERTAINING TO THE HOME:  Any stairs in or around the home? Yes  If so, are there any without handrails? No  Home free of loose throw rugs in walkways, pet beds, electrical cords, etc? Yes  Adequate lighting in your home to reduce risk of falls? Yes   ASSISTIVE DEVICES UTILIZED TO PREVENT FALLS:  Life alert? No  Use of a cane, walker or w/c? Yes  Grab bars in the bathroom? Yes  Shower chair or bench in shower? Yes  Elevated toilet seat or a handicapped toilet? Yes   TIMED UP AND GO:  Was the test performed? No .  Length of  time to ambulate 10 feet: 0 sec.   Gait steady and fast with assistive device  Cognitive Function: Normal cognitive status assessed by direct observation by this Nurse Health Advisor. No abnormalities found.          Immunizations Immunization History  Administered Date(s) Administered   Fluad Quad(high Dose 65+) 05/31/2019, 06/15/2020   Influenza Inj Mdck Quad With Preservative 07/09/2018   Influenza Split 07/13/2012   Influenza, High Dose Seasonal PF 06/24/2016, 06/05/2017   Influenza,inj,Quad PF,6+ Mos 06/15/2014   Influenza-Unspecified 06/15/2013   Moderna SARS-COV2 Booster Vaccination 08/04/2020   Moderna Sars-Covid-2 Vaccination 10/27/2019, 11/25/2019   Pneumococcal Conjugate-13 09/24/2017   Pneumococcal Polysaccharide-23 09/15/2005   Tetanus 04/18/2014    TDAP status: Up to date  Flu Vaccine status: Up to date  Pneumococcal vaccine status: Up to date  Covid-19 vaccine status: Completed vaccines  Qualifies for Shingles Vaccine? Yes   Zostavax completed No   Shingrix Completed?: No.    Education has been provided regarding the importance of this vaccine. Patient has been advised to call insurance company to determine out of pocket expense if they have not yet received this vaccine. Advised may also receive vaccine at local pharmacy or Health Dept. Verbalized acceptance and understanding.  Screening Tests Health Maintenance  Topic Date Due   INFLUENZA VACCINE  04/15/2021   TETANUS/TDAP  04/18/2024   COVID-19 Vaccine  Completed   PNA vac Low Risk Adult  Completed   HPV VACCINES  Aged Out    Health Maintenance  There are no preventive care reminders to display for this patient.  Colorectal cancer screening: No longer required.   Lung Cancer Screening: (  Low Dose CT Chest recommended if Age 44-80 years, 30 pack-year currently smoking OR have quit w/in 15years.) does not qualify.   Lung Cancer Screening Referral: no  Additional Screening:  Hepatitis C  Screening: does not qualify; Completed no  Vision Screening: Recommended annual ophthalmology exams for early detection of glaucoma and other disorders of the eye. Is the patient up to date with their annual eye exam?  Yes  Who is the provider or what is the name of the office in which the patient attends annual eye exams? Calvert Cantor, MD. If pt is not established with a provider, would they like to be referred to a provider to establish care? No .   Dental Screening: Recommended annual dental exams for proper oral hygiene  Community Resource Referral / Chronic Care Management: CRR required this visit?  No   CCM required this visit?  No      Plan:     I have personally reviewed and noted the following in the patient's chart:   Medical and social history Use of alcohol, tobacco or illicit drugs  Current medications and supplements Functional ability and status Nutritional status Physical activity Advanced directives List of other physicians Hospitalizations, surgeries, and ER visits in previous 12 months Vitals Screenings to include cognitive, depression, and falls Referrals and appointments  In addition, I have reviewed and discussed with patient certain preventive protocols, quality metrics, and best practice recommendations. A written personalized care plan for preventive services as well as general preventive health recommendations were provided to patient.     Sheral Flow, LPN   4/31/5400   Nurse Notes:  Medications reviewed with patient; no opioid use noted.  Medical screening examination/treatment/procedure(s) were performed by non-physician practitioner and as supervising physician I was immediately available for consultation/collaboration.  I agree with above. Lew Dawes, MD

## 2020-12-26 NOTE — Assessment & Plan Note (Signed)
Cont with Lipitor, Plavix, Isosorbide, Irbesartan, Coreg, Lasix, Ranexa

## 2020-12-26 NOTE — Assessment & Plan Note (Signed)
Zofran prn To ER if bad

## 2020-12-26 NOTE — Assessment & Plan Note (Signed)
Monitor BMET. 

## 2020-12-26 NOTE — Progress Notes (Signed)
Subjective:  Patient ID: [name] Curia., male    DOB: 1929-04-22  Age: 87 y.o. MRN: [id number]  CC: Follow-up (4-6 week f/u- C/o of getting SOB want to see if he qualify for oxygen)   HPI [name] Curia. presents for gallbladder pain at times, nausea. F/u on CHF - worse C/o SOB, DOE, weakness. He is here w/his wife. Hx obtained from the pt and his wife  Outpatient Medications Prior to Visit  Medication Sig Dispense Refill  . acetaminophen-codeine (TYLENOL #3) 300-30 MG tablet TAKE ONE-HALF TO 1 TABLET BY MOUTH EVERY 4 HOURS AS NEEDED FOR PAIN 20 tablet 1  . atorvastatin (LIPITOR) 40 MG tablet TAKE 1 TABLET DAILY 90 tablet 3  . carvedilol (COREG) 12.5 MG tablet Take one half tablet (6.25 mg) two times daily by mouth. 90 tablet 3  . Cholecalciferol 1000 UNITS tablet Take 1,000 Units by mouth daily.    . clopidogrel (PLAVIX) 75 MG tablet TAKE 1 TABLET DAILY 90 tablet 2  . furosemide (LASIX) 40 MG tablet Take one and a half tablets daily(60mg ), with an extra tablet(40mg ) in the afternoons on Monday, Wednesday, Friday and Saturday 90 tablet 3  . isosorbide mononitrate (IMDUR) 120 MG 24 hr tablet TAKE 1 TABLET DAILY 90 tablet 2  . Multiple Vitamins-Minerals (PRESERVISION AREDS 2) CAPS Take 1 capsule by mouth daily.     . nitroGLYCERIN (NITROSTAT) 0.4 MG SL tablet Place 1 tablet (0.4 mg total) under the tongue every 5 (five) minutes as needed for chest pain. 25 tablet 0  . ondansetron (ZOFRAN) 4 MG tablet Take 1 tablet (4 mg total) by mouth every 8 (eight) hours as needed for nausea or vomiting. 20 tablet 0  . potassium chloride (KLOR-CON) 10 MEQ tablet TAKE 1 TABLET DAILY 90 tablet 3  . ranolazine (RANEXA) 1000 MG SR tablet TAKE 1 TABLET TWICE A DAY 180 tablet 3  . traMADol (ULTRAM) 50 MG tablet Take 50 mg by mouth 4 (four) times daily as needed.    . traZODone (DESYREL) 50 MG tablet Take 0.5-1 tablets (25-50 mg total) by mouth at bedtime as needed for sleep. 30 tablet 5   No  facility-administered medications prior to visit.    ROS: Review of Systems  Constitutional: Positive for chills and fatigue. Negative for appetite change and unexpected weight change.  HENT: Negative for congestion, nosebleeds, sneezing, sore throat and trouble swallowing.   Eyes: Negative for itching and visual disturbance.  Respiratory: Positive for shortness of breath. Negative for cough.   Cardiovascular: Positive for leg swelling. Negative for chest pain and palpitations.  Gastrointestinal: Positive for abdominal pain, nausea and vomiting. Negative for abdominal distention, blood in stool and diarrhea.  Genitourinary: Negative for frequency and hematuria.  Musculoskeletal: Negative for back pain, gait problem, joint swelling and neck pain.  Skin: Negative for rash.  Neurological: Positive for weakness. Negative for dizziness, tremors and speech difficulty.  Psychiatric/Behavioral: Negative for agitation, dysphoric mood, sleep disturbance and suicidal ideas. The patient is not nervous/anxious.     Objective:  BP 120/80 (BP Location: Left Arm)   Pulse 77   Temp 98 F (36.7 C) (Oral)   Ht 5\' 7"  (1.702 m)   Wt 168 lb (76.2 kg)   SpO2 98%   BMI 26.31 kg/m   BP Readings from Last 3 Encounters:  12/26/20 120/80  12/26/20 120/80  12/07/20 104/60    Wt Readings from Last 3 Encounters:  12/26/20 168 lb (76.2 kg)  12/26/20 168  lb (76.2 kg)  12/07/20 168 lb (76.2 kg)    Physical Exam Constitutional:      General: He is not in acute distress.    Appearance: He is well-developed. He is not ill-appearing or toxic-appearing.     Comments: NAD  Eyes:     Conjunctiva/sclera: Conjunctivae normal.     Pupils: Pupils are equal, round, and reactive to light.  Neck:     Thyroid: No thyromegaly.     Vascular: No JVD.  Cardiovascular:     Rate and Rhythm: Normal rate and regular rhythm.     Heart sounds: Normal heart sounds. No murmur heard. No friction rub. No gallop.    Pulmonary:     Effort: Pulmonary effort is normal. No respiratory distress.     Breath sounds: Normal breath sounds. No wheezing or rales.  Chest:     Chest wall: No tenderness.  Abdominal:     General: Bowel sounds are normal. There is no distension.     Palpations: Abdomen is soft. There is no mass.     Tenderness: There is abdominal tenderness. There is no guarding or rebound.  Musculoskeletal:        General: No tenderness. Normal range of motion.     Cervical back: Normal range of motion.  Lymphadenopathy:     Cervical: No cervical adenopathy.  Skin:    General: Skin is warm and dry.     Findings: No rash.  Neurological:     Mental Status: He is alert and oriented to person, place, and time.     Cranial Nerves: No cranial nerve deficit.     Motor: No abnormal muscle tone.     Coordination: Coordination normal.     Gait: Gait normal.     Deep Tendon Reflexes: Reflexes are normal and symmetric.  Psychiatric:        Behavior: Behavior normal.        Thought Content: Thought content normal.        Judgment: Judgment normal.   sensitive RUQ Trace to 1+ edema B   Lab Results  Component Value Date   WBC 5.5 11/28/2020   HGB 9.3 (L) 11/28/2020   HCT 28.8 (L) 11/28/2020   PLT 252 11/28/2020   GLUCOSE 88 11/28/2020   CHOL 88 (L) 12/27/2019   TRIG 117 12/27/2019   HDL 35 (L) 12/27/2019   LDLDIRECT 35.0 12/24/2016   LDLCALC 32 12/27/2019   ALT 12 10/31/2020   AST 12 10/31/2020   NA 136 11/28/2020   K 5.0 11/28/2020   CL 99 11/28/2020   CREATININE 1.82 (H) 11/28/2020   BUN 37 (H) 11/28/2020   CO2 21 11/28/2020   TSH 3.71 12/24/2016   PSA 3.11 12/24/2016   INR 1.0 03/25/2016   HGBA1C 6.4 12/24/2016    US Abdomen Complete  Result Date: 11/04/2020 CLINICAL DATA:  87 year old male with right flank pain, nausea vomiting. EXAM: ABDOMEN ULTRASOUND COMPLETE COMPARISON:  None. FINDINGS: Gallbladder: Contracted gallbladder with suspicious shadowing in the region of the  gallbladder neck (image 4) compatible with 9 mm diameter stone. No pericholecystic fluid is evident. No sonographic Murphy sign elicited. Common bile duct: Diameter: Up to 7 mm, borderline to mildly dilated in this age group. Liver: No focal lesion identified. Within normal limits in parenchymal echogenicity. No intrahepatic biliary ductal dilatation is evident. Portal vein is patent on color Doppler imaging with normal direction of blood flow towards the liver. IVC: No abnormality visualized. Pancreas: Visualized portion unremarkable.  Spleen: Size and appearance within normal limits. Right Kidney: Length: 10.0 cm. Echogenicity at the upper limits of normal. No solid mass or hydronephrosis visualized. Fewer than 10 right renal cysts, with 2 simple and benign appearing cysts measuring 20-27 mm diameter (image 49). Left Kidney: Length: 11.2 cm. Echogenicity within normal limits. No solid mass or hydronephrosis visualized. Fewer than 10 left renal cysts. Benign appearing cysts at the poles measuring up to 22 mm diameter (image 64). Abdominal aorta: No aneurysm visualized. Other findings: No free fluid. IMPRESSION: 1. Suspicion of gallbladder contracted around a 9 mm stone in the neck. But no pericholecystic fluid or sonographic Murphy's sign to strongly suggest acute cholecystitis. 2. CBD is at the upper limits of normal to mildly enlarged, but there is no intrahepatic biliary ductal dilatation to corroborate acute bile duct obstruction. Electronically Signed   By: Genevie Ann M.D.   On: 11/04/2020 08:26    Assessment & Plan:    Follow-up: No follow-ups on file.  [name] Kehr, MD

## 2020-12-26 NOTE — Assessment & Plan Note (Signed)
No CP 

## 2020-12-26 NOTE — Assessment & Plan Note (Signed)
Treat CHF Start O2

## 2020-12-26 NOTE — Assessment & Plan Note (Signed)
  2/22 US IMPRESSION: 1. Suspicion of gallbladder contracted around a 9 mm stone in the neck. But no pericholecystic fluid or sonographic Murphy's sign to strongly suggest acute cholecystitis.  2. CBD is at the upper limits of normal to mildly enlarged, but there is no intrahepatic biliary ductal dilatation to corroborate acute bile duct obstruction.   Electronically Signed   By: Genevie Ann M.D.   On: 11/04/2020 08:26  Symptomatic. Rubinul prn. Zofran. Augmentin if sick Not a surgical candidate for surgery due to CHF (Dr Brantley Stage, Dr Acie Fredrickson)

## 2021-01-01 ENCOUNTER — Telehealth: Payer: Self-pay | Admitting: Internal Medicine

## 2021-01-01 DIAGNOSIS — R0609 Other forms of dyspnea: Secondary | ICD-10-CM

## 2021-01-01 DIAGNOSIS — I5022 Chronic systolic (congestive) heart failure: Secondary | ICD-10-CM

## 2021-01-01 DIAGNOSIS — R0600 Dyspnea, unspecified: Secondary | ICD-10-CM

## 2021-01-01 NOTE — Telephone Encounter (Signed)
24/7 prn 2 l/min [place] Thx

## 2021-01-01 NOTE — Telephone Encounter (Signed)
Melissa from [facility] called  The received an written order for a oxygen tank for patient. Order is missing the frequency so they are asking for an updated order with it included. Also they need a copy of the last SAT testing fax to them as well.   Requesting a call back at [phone]

## 2021-01-02 NOTE — Telephone Encounter (Signed)
Generated DME order with oxygen stats. Faxed to [place].Marland KitchenJohny [name]

## 2021-01-04 ENCOUNTER — Telehealth: Payer: Self-pay | Admitting: Cardiovascular Disease

## 2021-01-04 NOTE — Telephone Encounter (Signed)
Wife calling to report that pt is not doing well.  SOB has worsened.  Does minimal exertion and becomes very SOB and is wheezing.  Was so bad this morning he used his Nitro spray and it only helped briefly.  Swelling noted in feet, ankles and legs.  Has had some intermittent chest discomfort.  Also suffering with nausea and vomiting.  Hasn't really been eating.  Did manage to get some oatmeal and a piece of toast down.  Shortly after eating it though he became very nauseous and is now holding the trash can.  Advised wife that he needs to proceed to the ER for eval.  Advised he is likely dehydrated from all of the vomiting and they can assess the fluid situation.  Wife hesitant but agreed to have pt transported to ER.

## 2021-01-04 NOTE — Telephone Encounter (Signed)
Pt c/o of Chest Pain: STAT if CP now or developed within 24 hours  1. Are you having CP right now? yes  2. Are you experiencing any other symptoms (ex. SOB, nausea, vomiting, sweating)? Vomitting,wheezing  3. How long have you been experiencing CP? Just last  night  4. Is your CP continuous or coming and going?coming and going  5. Have you taken Nitroglycerin? Yes   Pt c/o Shortness Of Breath: STAT if SOB developed within the last 24 hours or pt is noticeably SOB on the phone  1. Are you currently SOB (can you hear that pt is SOB on the phone)?   2. How long have you been experiencing SOB? Last night   3. Are you SOB when sitting or when up moving around? When moving around  4. Are you currently experiencing any other symptoms? vomitting,wheezing  ?

## 2021-01-04 NOTE — Telephone Encounter (Signed)
I agree that he needs to be evaluated in the ER and likely admitted by [facility].

## 2021-01-06 ENCOUNTER — Encounter (HOSPITAL_COMMUNITY): Payer: Self-pay

## 2021-01-06 ENCOUNTER — Inpatient Hospital Stay (HOSPITAL_COMMUNITY)
Admission: EM | Admit: 2021-01-06 | Discharge: 2021-01-13 | DRG: 291 | Disposition: E | Payer: Medicare Other | Attending: Internal Medicine | Admitting: Internal Medicine

## 2021-01-06 ENCOUNTER — Other Ambulatory Visit: Payer: Self-pay

## 2021-01-06 ENCOUNTER — Emergency Department (HOSPITAL_COMMUNITY): Payer: Medicare Other

## 2021-01-06 DIAGNOSIS — Z888 Allergy status to other drugs, medicaments and biological substances status: Secondary | ICD-10-CM

## 2021-01-06 DIAGNOSIS — Z95810 Presence of automatic (implantable) cardiac defibrillator: Secondary | ICD-10-CM | POA: Diagnosis not present

## 2021-01-06 DIAGNOSIS — D649 Anemia, unspecified: Secondary | ICD-10-CM

## 2021-01-06 DIAGNOSIS — I959 Hypotension, unspecified: Secondary | ICD-10-CM | POA: Diagnosis not present

## 2021-01-06 DIAGNOSIS — Z20822 Contact with and (suspected) exposure to covid-19: Secondary | ICD-10-CM | POA: Diagnosis not present

## 2021-01-06 DIAGNOSIS — J90 Pleural effusion, not elsewhere classified: Secondary | ICD-10-CM | POA: Diagnosis not present

## 2021-01-06 DIAGNOSIS — Z87891 Personal history of nicotine dependence: Secondary | ICD-10-CM

## 2021-01-06 DIAGNOSIS — I2510 Atherosclerotic heart disease of native coronary artery without angina pectoris: Secondary | ICD-10-CM | POA: Diagnosis present

## 2021-01-06 DIAGNOSIS — I255 Ischemic cardiomyopathy: Secondary | ICD-10-CM | POA: Diagnosis not present

## 2021-01-06 DIAGNOSIS — I340 Nonrheumatic mitral (valve) insufficiency: Secondary | ICD-10-CM | POA: Diagnosis present

## 2021-01-06 DIAGNOSIS — K8020 Calculus of gallbladder without cholecystitis without obstruction: Secondary | ICD-10-CM | POA: Diagnosis not present

## 2021-01-06 DIAGNOSIS — I110 Hypertensive heart disease with heart failure: Secondary | ICD-10-CM | POA: Diagnosis not present

## 2021-01-06 DIAGNOSIS — Z885 Allergy status to narcotic agent status: Secondary | ICD-10-CM

## 2021-01-06 DIAGNOSIS — I252 Old myocardial infarction: Secondary | ICD-10-CM

## 2021-01-06 DIAGNOSIS — J309 Allergic rhinitis, unspecified: Secondary | ICD-10-CM | POA: Diagnosis not present

## 2021-01-06 DIAGNOSIS — R0602 Shortness of breath: Secondary | ICD-10-CM | POA: Diagnosis not present

## 2021-01-06 DIAGNOSIS — N1832 Chronic kidney disease, stage 3b: Secondary | ICD-10-CM | POA: Diagnosis present

## 2021-01-06 DIAGNOSIS — E785 Hyperlipidemia, unspecified: Secondary | ICD-10-CM | POA: Diagnosis present

## 2021-01-06 DIAGNOSIS — I43 Cardiomyopathy in diseases classified elsewhere: Secondary | ICD-10-CM | POA: Diagnosis not present

## 2021-01-06 DIAGNOSIS — M1990 Unspecified osteoarthritis, unspecified site: Secondary | ICD-10-CM | POA: Diagnosis present

## 2021-01-06 DIAGNOSIS — I4589 Other specified conduction disorders: Secondary | ICD-10-CM | POA: Diagnosis not present

## 2021-01-06 DIAGNOSIS — I509 Heart failure, unspecified: Secondary | ICD-10-CM

## 2021-01-06 DIAGNOSIS — K8010 Calculus of gallbladder with chronic cholecystitis without obstruction: Secondary | ICD-10-CM | POA: Diagnosis present

## 2021-01-06 DIAGNOSIS — J9601 Acute respiratory failure with hypoxia: Secondary | ICD-10-CM | POA: Diagnosis present

## 2021-01-06 DIAGNOSIS — E782 Mixed hyperlipidemia: Secondary | ICD-10-CM | POA: Diagnosis not present

## 2021-01-06 DIAGNOSIS — J9811 Atelectasis: Secondary | ICD-10-CM | POA: Diagnosis not present

## 2021-01-06 DIAGNOSIS — R7989 Other specified abnormal findings of blood chemistry: Secondary | ICD-10-CM | POA: Diagnosis present

## 2021-01-06 DIAGNOSIS — Z951 Presence of aortocoronary bypass graft: Secondary | ICD-10-CM

## 2021-01-06 DIAGNOSIS — E871 Hypo-osmolality and hyponatremia: Secondary | ICD-10-CM | POA: Diagnosis present

## 2021-01-06 DIAGNOSIS — Z79899 Other long term (current) drug therapy: Secondary | ICD-10-CM

## 2021-01-06 DIAGNOSIS — R069 Unspecified abnormalities of breathing: Secondary | ICD-10-CM | POA: Diagnosis not present

## 2021-01-06 DIAGNOSIS — K761 Chronic passive congestion of liver: Secondary | ICD-10-CM | POA: Diagnosis not present

## 2021-01-06 DIAGNOSIS — I130 Hypertensive heart and chronic kidney disease with heart failure and stage 1 through stage 4 chronic kidney disease, or unspecified chronic kidney disease: Principal | ICD-10-CM | POA: Diagnosis present

## 2021-01-06 DIAGNOSIS — Z79891 Long term (current) use of opiate analgesic: Secondary | ICD-10-CM

## 2021-01-06 DIAGNOSIS — G4733 Obstructive sleep apnea (adult) (pediatric): Secondary | ICD-10-CM | POA: Diagnosis present

## 2021-01-06 DIAGNOSIS — N179 Acute kidney failure, unspecified: Secondary | ICD-10-CM | POA: Diagnosis not present

## 2021-01-06 DIAGNOSIS — Z7902 Long term (current) use of antithrombotics/antiplatelets: Secondary | ICD-10-CM

## 2021-01-06 DIAGNOSIS — I5089 Other heart failure: Secondary | ICD-10-CM

## 2021-01-06 DIAGNOSIS — Z955 Presence of coronary angioplasty implant and graft: Secondary | ICD-10-CM

## 2021-01-06 DIAGNOSIS — Z743 Need for continuous supervision: Secondary | ICD-10-CM | POA: Diagnosis not present

## 2021-01-06 DIAGNOSIS — N189 Chronic kidney disease, unspecified: Secondary | ICD-10-CM | POA: Diagnosis not present

## 2021-01-06 DIAGNOSIS — Z8719 Personal history of other diseases of the digestive system: Secondary | ICD-10-CM

## 2021-01-06 DIAGNOSIS — I25119 Atherosclerotic heart disease of native coronary artery with unspecified angina pectoris: Secondary | ICD-10-CM

## 2021-01-06 DIAGNOSIS — Z8249 Family history of ischemic heart disease and other diseases of the circulatory system: Secondary | ICD-10-CM

## 2021-01-06 DIAGNOSIS — E875 Hyperkalemia: Secondary | ICD-10-CM | POA: Diagnosis present

## 2021-01-06 DIAGNOSIS — Z981 Arthrodesis status: Secondary | ICD-10-CM

## 2021-01-06 DIAGNOSIS — I119 Hypertensive heart disease without heart failure: Secondary | ICD-10-CM | POA: Diagnosis present

## 2021-01-06 DIAGNOSIS — I517 Cardiomegaly: Secondary | ICD-10-CM | POA: Diagnosis not present

## 2021-01-06 DIAGNOSIS — R945 Abnormal results of liver function studies: Secondary | ICD-10-CM | POA: Diagnosis not present

## 2021-01-06 DIAGNOSIS — I5043 Acute on chronic combined systolic (congestive) and diastolic (congestive) heart failure: Secondary | ICD-10-CM | POA: Diagnosis present

## 2021-01-06 DIAGNOSIS — D631 Anemia in chronic kidney disease: Secondary | ICD-10-CM | POA: Diagnosis present

## 2021-01-06 DIAGNOSIS — Z515 Encounter for palliative care: Secondary | ICD-10-CM | POA: Diagnosis not present

## 2021-01-06 DIAGNOSIS — R0689 Other abnormalities of breathing: Secondary | ICD-10-CM | POA: Diagnosis not present

## 2021-01-06 DIAGNOSIS — R6889 Other general symptoms and signs: Secondary | ICD-10-CM | POA: Diagnosis not present

## 2021-01-06 DIAGNOSIS — N183 Chronic kidney disease, stage 3 unspecified: Secondary | ICD-10-CM | POA: Diagnosis present

## 2021-01-06 DIAGNOSIS — R188 Other ascites: Secondary | ICD-10-CM | POA: Diagnosis not present

## 2021-01-06 LAB — HEPATIC FUNCTION PANEL
ALT: 211 U/L — ABNORMAL HIGH (ref 0–44)
AST: 175 U/L — ABNORMAL HIGH (ref 15–41)
Albumin: 3.4 g/dL — ABNORMAL LOW (ref 3.5–5.0)
Alkaline Phosphatase: 215 U/L — ABNORMAL HIGH (ref 38–126)
Bilirubin, Direct: 0.8 mg/dL — ABNORMAL HIGH (ref 0.0–0.2)
Indirect Bilirubin: 1.1 mg/dL — ABNORMAL HIGH (ref 0.3–0.9)
Total Bilirubin: 1.9 mg/dL — ABNORMAL HIGH (ref 0.3–1.2)
Total Protein: 6.6 g/dL (ref 6.5–8.1)

## 2021-01-06 LAB — BASIC METABOLIC PANEL
Anion gap: 13 (ref 5–15)
Anion gap: 14 (ref 5–15)
BUN: 56 mg/dL — ABNORMAL HIGH (ref 8–23)
BUN: 59 mg/dL — ABNORMAL HIGH (ref 8–23)
CO2: 18 mmol/L — ABNORMAL LOW (ref 22–32)
CO2: 17 mmol/L — ABNORMAL LOW (ref 22–32)
Calcium: 8.5 mg/dL — ABNORMAL LOW (ref 8.9–10.3)
Calcium: 8.4 mg/dL — ABNORMAL LOW (ref 8.9–10.3)
Chloride: 91 mmol/L — ABNORMAL LOW (ref 98–111)
Chloride: 90 mmol/L — ABNORMAL LOW (ref 98–111)
Creatinine, Ser: 2.23 mg/dL — ABNORMAL HIGH (ref 0.61–1.24)
Creatinine, Ser: 2.36 mg/dL — ABNORMAL HIGH (ref 0.61–1.24)
GFR, Estimated: 27 mL/min — ABNORMAL LOW (ref >60)
GFR, Estimated: 26 mL/min — ABNORMAL LOW (ref >60)
Glucose, Bld: 115 mg/dL — ABNORMAL HIGH (ref 70–99)
Glucose, Bld: 113 mg/dL — ABNORMAL HIGH (ref 70–99)
Potassium: 5.1 mmol/L (ref 3.5–5.1)
Potassium: 5.8 mmol/L — ABNORMAL HIGH (ref 3.5–5.1)
Sodium: 122 mmol/L — ABNORMAL LOW (ref 135–145)
Sodium: 121 mmol/L — ABNORMAL LOW (ref 135–145)

## 2021-01-06 LAB — RESP PANEL BY RT-PCR (FLU A&B, COVID) ARPGX2
Influenza A by PCR: NEGATIVE
Influenza B by PCR: NEGATIVE
SARS Coronavirus 2 by RT PCR: NEGATIVE

## 2021-01-06 LAB — CBC
HCT: 27.8 % — ABNORMAL LOW (ref 39.0–52.0)
Hemoglobin: 8.7 g/dL — ABNORMAL LOW (ref 13.0–17.0)
MCH: 25.7 pg — ABNORMAL LOW (ref 26.0–34.0)
MCHC: 31.3 g/dL (ref 30.0–36.0)
MCV: 82.2 fL (ref 80.0–100.0)
Platelets: 302 10*3/uL (ref 150–400)
RBC: 3.38 MIL/uL — ABNORMAL LOW (ref 4.22–5.81)
RDW: 18.5 % — ABNORMAL HIGH (ref 11.5–15.5)
WBC: 9.7 10*3/uL (ref 4.0–10.5)
nRBC: 0 % (ref 0.0–0.2)

## 2021-01-06 LAB — TROPONIN I (HIGH SENSITIVITY)
Troponin I (High Sensitivity): 38 ng/L — ABNORMAL HIGH (ref <18)
Troponin I (High Sensitivity): 30 ng/L — ABNORMAL HIGH (ref <18)

## 2021-01-06 LAB — LIPASE, BLOOD: Lipase: 24 U/L (ref 11–51)

## 2021-01-06 LAB — MAGNESIUM: Magnesium: 2.3 mg/dL (ref 1.7–2.4)

## 2021-01-06 LAB — BRAIN NATRIURETIC PEPTIDE: B Natriuretic Peptide: 1495 pg/mL — ABNORMAL HIGH (ref 0.0–100.0)

## 2021-01-06 MED ORDER — ACETAMINOPHEN-CODEINE #3 300-30 MG PO TABS: 1.0000 | ORAL_TABLET | ORAL | Status: DC | PRN

## 2021-01-06 MED ORDER — SODIUM CHLORIDE 0.9% FLUSH: 3.0000 mL | INTRAVENOUS | Status: DC | PRN

## 2021-01-06 MED ORDER — ATORVASTATIN CALCIUM 40 MG PO TABS
40.0000 mg | ORAL_TABLET | Freq: Every day | ORAL | Status: DC
  Filled 2021-01-06: qty 1

## 2021-01-06 MED ORDER — CARVEDILOL 3.125 MG PO TABS
3.1250 mg | ORAL_TABLET | Freq: Two times a day (BID) | ORAL | Status: DC
  Filled 2021-01-06: qty 1

## 2021-01-06 MED ORDER — ACETAMINOPHEN 325 MG PO TABS: 650.0000 mg | ORAL_TABLET | ORAL | Status: DC | PRN

## 2021-01-06 MED ORDER — SODIUM CHLORIDE 0.9 % IV SOLN: 250.0000 mL | INTRAVENOUS | Status: DC | PRN

## 2021-01-06 MED ORDER — SODIUM CHLORIDE 0.9% FLUSH
3.0000 mL | Freq: Two times a day (BID) | INTRAVENOUS | Status: DC
  Administered 2021-01-07: 3 mL via INTRAVENOUS

## 2021-01-06 MED ORDER — TRAZODONE HCL 50 MG PO TABS: 25.0000 mg | ORAL_TABLET | Freq: Every evening | ORAL | Status: DC | PRN

## 2021-01-06 MED ORDER — CLOPIDOGREL BISULFATE 75 MG PO TABS
75.0000 mg | ORAL_TABLET | Freq: Every day | ORAL | Status: DC
  Administered 2021-01-06 – 2021-01-07 (×2): 75 mg via ORAL
  Filled 2021-01-06 (×2): qty 1

## 2021-01-06 MED ORDER — ONDANSETRON HCL 4 MG/2ML IJ SOLN: 4.0000 mg | Freq: Four times a day (QID) | INTRAMUSCULAR | Status: DC | PRN

## 2021-01-06 MED ORDER — FUROSEMIDE 10 MG/ML IJ SOLN
80.0000 mg | Freq: Two times a day (BID) | INTRAMUSCULAR | Status: DC
  Administered 2021-01-07: 80 mg via INTRAVENOUS
  Filled 2021-01-06: qty 8

## 2021-01-06 MED ORDER — FUROSEMIDE 10 MG/ML IJ SOLN
80.0000 mg | Freq: Once | INTRAMUSCULAR | Status: AC
  Administered 2021-01-06: 80 mg via INTRAVENOUS
  Filled 2021-01-06: qty 8

## 2021-01-06 MED ORDER — POTASSIUM CHLORIDE CRYS ER 20 MEQ PO TBCR
40.0000 meq | EXTENDED_RELEASE_TABLET | Freq: Two times a day (BID) | ORAL | Status: DC
  Administered 2021-01-06: 40 meq via ORAL
  Filled 2021-01-06: qty 2

## 2021-01-06 MED ORDER — RANOLAZINE ER 500 MG PO TB12
1000.0000 mg | ORAL_TABLET | Freq: Two times a day (BID) | ORAL | Status: DC
  Administered 2021-01-06 – 2021-01-07 (×2): 1000 mg via ORAL
  Filled 2021-01-06 (×2): qty 2

## 2021-01-06 MED ORDER — GLYCOPYRROLATE 1 MG PO TABS: 2.0000 mg | ORAL_TABLET | Freq: Three times a day (TID) | ORAL | Status: DC | PRN

## 2021-01-06 MED ORDER — ENOXAPARIN SODIUM 30 MG/0.3ML ~~LOC~~ SOLN
30.0000 mg | SUBCUTANEOUS | Status: DC
  Administered 2021-01-06: 30 mg via SUBCUTANEOUS
  Filled 2021-01-06: qty 0.3

## 2021-01-06 NOTE — Progress Notes (Signed)
Pt having trouble urinating today.  Stated hes last time was at 1100 today.  Was given lasix in ER and still had not voided.  Bladder scan shown 515cc in bladder, straight cath returned 500cc.  Dr. Nehemiah Settle called nurse just after to make sure pt was diuresing and he was updated.  Nurse also noted that pt BP was low and Dr. Nehemiah Settle was fine with it as long as pt was asymptomatic.

## 2021-01-06 NOTE — ED Notes (Signed)
PCXR in progress

## 2021-01-06 NOTE — ED Triage Notes (Signed)
Pt here via [facility] with c/o fluid overload x 3 weeks, could not get in with PCP, states taking fluid pill as prescribed. Pitting edema bilat

## 2021-01-06 NOTE — ED Provider Notes (Signed)
[facility] EMERGENCY DEPARTMENT Provider Note   CSN: [id number] Arrival date & time: 12/28/2020  1443     History Chief Complaint  Patient presents with  . Leg Swelling    [name] [name]. is a 87 y.o. male.  Pt presents to the ED today with sob.  The pt said he has been getting more and more sob for the past 3 weeks.  He also feels weaker.  He has a hx of gallstones, but has not been able to get surgery due to his CAD hx.  Pt has had some n/v.  Pt has increased his lasix, but it does not seem to help with the swelling.  Pt said he can't lay flat and gets sob with just a little movement.        Past Medical History:  Diagnosis Date  . AICD (automatic cardioverter/defibrillator) present   . Arrhythmia   . Arthritis    "hands, legs" (03/26/2016)  . 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 Chronic systolic CHF (congestive heart failure) ([place])    a. Echo 9/14: Mild LVH, mild focal basal septal hypertrophy, EF 35-40%, anteroseptal HK, normal diastolic function, mild MR, mild LAE, PASP 34 mmHg  . Chronotropic incompetence    treated w/pacemaker, ICD 7.2008. This was placed after CPX done post 2008 cath. CPX showed only chronotropic incompetence  . History of blood transfusion 1969   "after getting MSO4; it destroys my corpuscles" (03/26/2016)  . History of colon polyps   . Hyperlipidemia   . Hypertension   . Migraine    "just before my bypass" (03/26/2016)  . Mitral regurgitation    mild  . Myocardial infarction ([place]) "several"  . Numbness and tingling of left arm and leg    improved after tx w/prednisone  . OSA (obstructive sleep apnea) 09/01/2016   Severe OSA with AHI 38/hr now on BiPAP at  21/17cm H2O  . Syncopal episodes    relative hypotension    Patient Active Problem List   Diagnosis Date Noted  . Cholelithiasis 12/26/2020  . Flank pain 10/31/2020  . Nausea & vomiting 10/31/2020  . Insomnia 01/05/2020  . Cat bite 07/20/2019  . DOE (dyspnea on exertion) 05/31/2019  . Edema 05/31/2019  . Varicose veins of both lower extremities without ulcer or inflammation 02/02/2018  . Allergic rhinitis 12/29/2017  . HLD (hyperlipidemia) 11/09/2017  . Left lateral epicondylitis 07/02/2017  . Knee pain, right 12/24/2016  . OSA (obstructive sleep apnea) 09/01/2016  . Status post coronary artery stent placement   . Hypertensive heart disease with heart failure ([place])   . Unstable angina ([place])   . Thumb pain 05/04/2015  . Chronic systolic CHF (congestive heart failure) ([place]) 11/18/2013  . Ejection fraction   . CAD (coronary artery disease)   . Well adult exam 07/13/2012  . Chronic renal insufficiency, stage III (moderate) ([place]) 07/21/2011  . Ischemic cardiomyopathy   . Hx of CABG   . Chronotropic incompetence   . Syncopal episodes   . Mitral regurgitation   . Numbness and tingling of left arm and leg   . Arm pain, diffuse, left 10/22/2009  . PARESTHESIA 10/22/2009  .  TOBACCO USE, QUIT 10/22/2009  . SEBACEOUS CYST 08/27/2009  . Cough 02/28/2009  . Anemia, chronic disease 02/18/2008  . Dyslipidemia 05/03/2007  . Hypertensive heart disease 05/03/2007  . COLONIC POLYPS, HX OF 05/03/2007  . Automatic implantable cardioverter-defibrillator in situ 03/16/2007    Past Surgical History:  Procedure Laterality Date  . BACK SURGERY    . CARDIAC CATHETERIZATION  1980s-1990s X 4  . CARDIAC CATHETERIZATION N/A 03/26/2016   Procedure: Left Heart Cath and Cors/Grafts Angiography;  Surgeon: Burnell Blanks, MD;  Location: [place] [facility];  Service: Cardiovascular;  Laterality: N/A;  . CARDIAC CATHETERIZATION N/A 03/26/2016   Procedure: Coronary Stent Intervention;  Surgeon:  Burnell Blanks, MD;  Location: [place] [facility];  Service: Cardiovascular;  Laterality: N/A;  . CARDIAC DEFIBRILLATOR PLACEMENT  2008  . CATARACT EXTRACTION W/ INTRAOCULAR LENS  IMPLANT, BILATERAL Bilateral 08/2013 - 09/2013   right - left   . CORONARY ANGIOPLASTY WITH STENT PLACEMENT  ~ 1992; 03/26/2016   "1 + 2"  . CORONARY ARTERY BYPASS GRAFT  1992   CABG "X4"  . CYSTOSCOPY  1980s  . GAS/FLUID EXCHANGE Right 06/18/2017   Procedure: GAS/FLUID EXCHANGE (C3F8);  Surgeon: Sherlynn Stalls, MD;  Location: [place];  Service: Ophthalmology;  Laterality: Right;  . IMPLANTABLE CARDIOVERTER DEFIBRILLATOR (ICD) GENERATOR CHANGE N/A 06/21/2014   Procedure: ICD GENERATOR CHANGE;  Surgeon: Deboraha Sprang, MD;  Location: [facility] C[facility] LAB;  Service: Cardiovascular;  Laterality: N/A;  . PARS PLANA VITRECTOMY Right 06/18/2017   Procedure: PARS PLANA VITRECTOMY WITH 25 GAUGE;  Surgeon: Sherlynn Stalls, MD;  Location: [place];  Service: Ophthalmology;  Laterality: Right;  . PHOTOCOAGULATION WITH LASER Right 06/18/2017   Procedure: PHOTOCOAGULATION WITH LASER- ENDO LASER;  Surgeon: Sherlynn Stalls, MD;  Location: [place];  Service: Ophthalmology;  Laterality: Right;  . POSTERIOR LUMBAR FUSION  ~ 1989   "fused 2 discs"  . TIBIA FRACTURE SURGERY Left 1943   "put artificial bone in there"       Family History  Problem Relation Age of Onset  . Hypertension Mother   . Hypertension Father   . Hypertension Other   . Colon cancer Other        1st degree relative <50    Social History   Tobacco Use  . Smoking status: Former Smoker    Years: 29.00    Types: Cigars  . Smokeless tobacco: Never Used  . Tobacco comment: quit smoking cigars in 1973  Vaping Use  . Vaping Use: Never used  Substance Use Topics  . Alcohol use: Not Currently    Comment: "quit drinking in 1973"  . Drug use: No    Home Medications Prior to Admission medications   Medication Sig Start Date End Date Taking? Authorizing Provider   atorvastatin (LIPITOR) 40 MG tablet TAKE 1 TABLET DAILY Patient taking differently: Take 40 mg by mouth daily. 08/22/20  Yes Nahser, Wonda Cheng, MD  carvedilol (COREG) 12.5 MG tablet Take one half tablet (6.25 mg) two times daily by mouth. Patient taking differently: Take 6.25 mg by mouth 2 (two) times daily with a meal. Take one half tablet (6.25 mg) two times daily by mouth. 11/28/20  Yes Richardson Dopp T, PA-C  Cholecalciferol 1000 UNITS tablet Take 1,000 Units by mouth daily.   Yes [provider]  clopidogrel (PLAVIX) 75 MG tablet TAKE 1 TABLET DAILY Patient taking differently: Take 75 mg by mouth daily. 09/26/20  Yes Burnell Blanks, MD  furosemide (LASIX)  40 MG tablet Take one and a half tablets daily(60mg ), with an extra tablet(40mg ) in the afternoons on Monday, Wednesday, Friday and Saturday 12/07/20  Yes Nahser, Wonda Cheng, MD  glycopyrrolate (ROBINUL-FORTE) 2 MG tablet Take 1 tablet (2 mg total) by mouth 3 (three) times daily as needed (gallbladder spasms). 12/26/20  Yes Plotnikov, Evie Lacks, MD  isosorbide mononitrate (IMDUR) 120 MG 24 hr tablet TAKE 1 TABLET DAILY Patient taking differently: Take 120 mg by mouth daily. 05/15/20  Yes Nahser, Wonda Cheng, MD  Multiple Vitamins-Minerals (PRESERVISION AREDS 2) CAPS Take 1 capsule by mouth daily.    Yes [provider]  nitroGLYCERIN (NITROSTAT) 0.4 MG SL tablet Place 1 tablet (0.4 mg total) under the tongue every 5 (five) minutes as needed for chest pain. 11/28/20 02/26/21 Yes Weaver, Scott T, PA-C  potassium chloride (KLOR-CON) 10 MEQ tablet TAKE 1 TABLET DAILY Patient taking differently: Take 10 mEq by mouth daily. 08/22/20  Yes Nahser, Wonda Cheng, MD  ranolazine (RANEXA) 1000 MG SR tablet TAKE 1 TABLET TWICE A DAY Patient taking differently: Take 1,000 mg by mouth 2 (two) times daily. 07/17/20  Yes Nahser, Wonda Cheng, MD  traZODone (DESYREL) 50 MG tablet Take 0.5-1 tablets (25-50 mg total) by mouth at bedtime as needed for sleep.  01/05/20  Yes Plotnikov, Evie Lacks, MD  acetaminophen-codeine (TYLENOL #3) 300-30 MG tablet TAKE ONE-HALF TO 1 TABLET BY MOUTH EVERY 4 HOURS AS NEEDED FOR PAIN 12/20/20   Plotnikov, Evie Lacks, MD  amoxicillin-clavulanate (AUGMENTIN) 875-125 MG tablet Take 1 tablet by mouth 2 (two) times daily. 12/26/20   Plotnikov, Evie Lacks, MD  ondansetron (ZOFRAN) 4 MG tablet Take 1 tablet (4 mg total) by mouth every 8 (eight) hours as needed for nausea or vomiting. 10/31/20   Plotnikov, Evie Lacks, MD    Allergies    Morphine and Ramipril  Review of Systems   Review of Systems  Respiratory: Positive for shortness of breath.   Gastrointestinal: Positive for nausea and vomiting.  All other systems reviewed and are negative.   Physical Exam Updated Vital Signs BP 106/70   Pulse 70   Temp (!) 97.2 F (36.2 C) (Oral)   Resp (!) 25   Ht 5\' 7"  (1.702 m)   Wt 72.1 kg   SpO2 100%   BMI 24.90 kg/m   Physical Exam Vitals and nursing note reviewed.  Constitutional:      Appearance: Normal appearance.  HENT:     Head: Normocephalic and atraumatic.     Right Ear: External ear normal.     Left Ear: External ear normal.     Nose: Nose normal.     Mouth/Throat:     Mouth: Mucous membranes are dry.  Eyes:     Extraocular Movements: Extraocular movements intact.     Conjunctiva/sclera: Conjunctivae normal.     Pupils: Pupils are equal, round, and reactive to light.  Cardiovascular:     Rate and Rhythm: Normal rate and regular rhythm.     Pulses: Normal pulses.     Heart sounds: Normal heart sounds.  Pulmonary:     Effort: Pulmonary effort is normal.     Breath sounds: Rhonchi present.  Abdominal:     General: Abdomen is flat. Bowel sounds are normal.     Palpations: Abdomen is soft.  Musculoskeletal:     Cervical back: Normal range of motion and neck supple.     Right lower leg: Edema present.     Left lower leg: Edema  present.  Skin:    General: Skin is warm.     Capillary Refill: Capillary  refill takes less than 2 seconds.  Neurological:     General: No focal deficit present.     Mental Status: He is alert and oriented to person, place, and time.  Psychiatric:        Mood and Affect: Mood normal.        Behavior: Behavior normal.        Thought Content: Thought content normal.        Judgment: Judgment normal.     ED Results / Procedures / Treatments   Labs (all labs ordered are listed, but only abnormal results are displayed) Labs Reviewed  BASIC METABOLIC PANEL - Abnormal; Notable for the following components:      Result Value   Sodium 122 (*)    Chloride 91 (*)    CO2 18 (*)    Glucose, Bld 115 (*)    BUN 56 (*)    Creatinine, Ser 2.23 (*)    Calcium 8.5 (*)    GFR, Estimated 27 (*)    All other components within normal limits  CBC - Abnormal; Notable for the following components:   RBC 3.38 (*)    Hemoglobin 8.7 (*)    HCT 27.8 (*)    MCH 25.7 (*)    RDW 18.5 (*)    All other components within normal limits  BRAIN NATRIURETIC PEPTIDE - Abnormal; Notable for the following components:   B Natriuretic Peptide 1,495.0 (*)    All other components within normal limits  HEPATIC FUNCTION PANEL - Abnormal; Notable for the following components:   Albumin 3.4 (*)    AST 175 (*)    ALT 211 (*)    Alkaline Phosphatase 215 (*)    Total Bilirubin 1.9 (*)    Bilirubin, Direct 0.8 (*)    Indirect Bilirubin 1.1 (*)    All other components within normal limits  TROPONIN I (HIGH SENSITIVITY) - Abnormal; Notable for the following components:   Troponin I (High Sensitivity) 38 (*)    All other components within normal limits  RESP PANEL BY RT-PCR (FLU A&B, COVID) ARPGX2  LIPASE, BLOOD  MAGNESIUM  TROPONIN I (HIGH SENSITIVITY)    EKG EKG Interpretation  Date/Time:  Sunday January 06 2021 15:04:31 EDT Ventricular Rate:  70 PR Interval:    QRS Duration: 221 QT Interval:  515 QTC Calculation: 556 R Axis:   -76 Text Interpretation: paced rhythm.QRS more  prolonged. Confirmed by Isla Pence [id number]) on 12/24/2020 3:43:47 PM   Radiology DG Chest Portable 1 View  Result Date: 12/24/2020 CLINICAL DATA:  Patient complains of fluid overload for 3 weeks. EXAM: PORTABLE CHEST 1 VIEW COMPARISON:  December 29, 2017 FINDINGS: Stable cardiomegaly and AICD device. Sternotomy wires are intact. No pneumothorax. No overt edema. There appear to be small pleural effusions, right greater than left with underlying atelectasis. No other acute abnormalities. IMPRESSION: 1. Cardiomegaly without overt edema. 2. Right greater than left small pleural effusions. Electronically Signed   By: Dorise Bullion III M.D   On: 01/05/2021 15:29    Procedures Procedures   Medications Ordered in ED Medications  furosemide (LASIX) injection 80 mg (80 mg Intravenous Given 01/01/2021 1811)    ED Course  I have reviewed the triage vital signs and the nursing notes.  Pertinent labs & imaging results that were available during my care of the patient were reviewed by me and considered in  my medical decision making (see chart for details).    MDM Rules/Calculators/A&P                          BP is soft, but he is clinically fluid overloaded.  BP has come up from sbp of 90 to 109, so he is given IV lasix.  Pt's AICD interrogated by [facility]. Jude.  No arrhythmias, but pt is significantly fluid overloaded.  Pt also has some aki and hyponatremia.  He will need to be hospitalized to monitor his fluid status and kidney function.  Pt's O2 sat dropped to 89 when he was laid flat to pull him up in bed, but came right back up to the upper 90s when he was sat back up.  [name] was evaluated in Emergency Department on 12/28/2020 for the symptoms described in the history of present illness. He was evaluated in the context of the global COVID-19 pandemic, which necessitated consideration that the patient might be at risk for infection with the SARS-CoV-2 virus that causes COVID-19.  Institutional protocols and algorithms that pertain to the evaluation of patients at risk for COVID-19 are in a state of rapid change based on information released by regulatory bodies including the CDC and federal and state organizations. These policies and algorithms were followed during the patient's care in the ED.  CRITICAL CARE Performed by: Isla Pence   Total critical care time: 30 minutes  Critical care time was exclusive of separately billable procedures and treating other patients.  Critical care was necessary to treat or prevent imminent or life-threatening deterioration.  Critical care was time spent personally by me on the following activities: development of treatment plan with patient and/or surrogate as well as nursing, discussions with consultants, evaluation of patient's response to treatment, examination of patient, obtaining history from patient or surrogate, ordering and performing treatments and interventions, ordering and review of laboratory studies, ordering and review of radiographic studies, pulse oximetry and re-evaluation of patient's condition.  Pt d/w Dr. Nehemiah Settle ([facility]) for admission.  Final Clinical Impression(s) / ED Diagnoses Final diagnoses:  Acute on chronic congestive heart failure, unspecified heart failure type ([place])  Chronic anemia  AKI (acute kidney injury) ([place])  Hyponatremia    Rx / DC Orders ED Discharge Orders    None       Isla Pence, MD 12/18/2020 1819

## 2021-01-06 NOTE — H&P (Signed)
History and Physical  [name] [name]. CR:[id number] DOB: 1929/01/20 DOA: 12/22/2020  Referring physician: Dr Gilford Raid, ED physician PCP: Cassandria Anger, MD  Outpatient Specialists:   Patient Coming From: home  Chief Complaint: SOB, fluid overloaded  HPI: [name] [name]. is a 87 y.o. male with a history of coronary artery disease with CABG x4 vessels and stenting of bypass graft, hypertensive cardiomyopathy with chronic grade 3 diastolic heart failure and systolic heart failure with EF of 30% on echo February 2022, hyperlipidemia, presence of AICD.  Patient presents with worsening heart failure and fluid overload that started couple weeks ago.  Symptoms have been worsening.  He is increased his Lasix per his cardiologist, however his symptoms continue to worsen.  He is intolerant to walking except for a few feet.  He also becomes hypoxic when he lays down or when he starts to cough.  He also has been having increasing swelling of his lower legs bilaterally  Additionally, the patient has had cholelithiasis with gallbladder colic has 9 mm stone in neck.  He has been unable to do surgery due to decompensating heart failure.  Emergency Department Course: Oxygen level mid 90s on arrival.  Drops to mid 80s with laying down flat, ambulation.  BNP 1495.  Troponin 38 and 30 on repeat.  Creatinine 2.23, sodium 122.  LFTs 175, 211, elevated bilirubin.  Review of Systems:   Pt denies any fevers, chills, nausea, vomiting, diarrhea, constipation, wheezing, palpitations, headache, vision changes, lightheadedness, dizziness, melena, rectal bleeding.  Review of systems are otherwise negative  Past Medical History:  Diagnosis Date  . AICD (automatic cardioverter/defibrillator) present   . Arrhythmia   . Arthritis    "hands, legs" (03/26/2016)  . 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 Chronic systolic CHF (congestive heart failure) ([place])    a. Echo 9/14: Mild LVH, mild focal basal septal hypertrophy, EF 35-40%, anteroseptal HK, normal diastolic function, mild MR, mild LAE, PASP 34 mmHg  . Chronotropic incompetence    treated w/pacemaker, ICD 7.2008. This was placed after CPX done post 2008 cath. CPX showed only chronotropic incompetence  . History of blood transfusion 1969   "after getting MSO4; it destroys my corpuscles" (03/26/2016)  . History of colon polyps   . Hyperlipidemia   . Hypertension   . Migraine    "just before my bypass" (03/26/2016)  . Mitral regurgitation    mild  . Myocardial infarction ([place]) "several"  . Numbness and tingling of left arm and leg    improved after tx w/prednisone  . OSA (obstructive sleep apnea) 09/01/2016   Severe OSA with AHI 38/hr now on BiPAP at 21/17cm H2O  . Syncopal episodes    relative hypotension   Past Surgical History:  Procedure Laterality Date  . BACK SURGERY    . CARDIAC CATHETERIZATION  1980s-1990s X 4  . CARDIAC CATHETERIZATION N/A 03/26/2016   Procedure: Left Heart Cath and Cors/Grafts Angiography;  Surgeon: Burnell Blanks, MD;  Location: [facility];  Service: Cardiovascular;  Laterality: N/A;  . CARDIAC CATHETERIZATION N/A 03/26/2016   Procedure: Coronary Stent Intervention;  Surgeon: Burnell Blanks, MD;  Location:  [facility];  Service: Cardiovascular;  Laterality: N/A;  . CARDIAC DEFIBRILLATOR PLACEMENT  2008  . CATARACT EXTRACTION W/ INTRAOCULAR LENS  IMPLANT, BILATERAL Bilateral 08/2013 - 09/2013   right - left   . CORONARY ANGIOPLASTY WITH STENT PLACEMENT  ~ 1992; 03/26/2016   "1 + 2"  . CORONARY ARTERY BYPASS GRAFT  1992   CABG "X4"  . CYSTOSCOPY  1980s  . GAS/FLUID EXCHANGE Right 06/18/2017   Procedure:  GAS/FLUID EXCHANGE (C3F8);  Surgeon: Sherlynn Stalls, MD;  Location: [facility];  Service: Ophthalmology;  Laterality: Right;  . IMPLANTABLE CARDIOVERTER DEFIBRILLATOR (ICD) GENERATOR CHANGE N/A 06/21/2014   Procedure: ICD GENERATOR CHANGE;  Surgeon: Deboraha Sprang, MD;  Location: [facility];  Service: Cardiovascular;  Laterality: N/A;  . PARS PLANA VITRECTOMY Right 06/18/2017   Procedure: PARS PLANA VITRECTOMY WITH 25 GAUGE;  Surgeon: Sherlynn Stalls, MD;  Location: [facility];  Service: Ophthalmology;  Laterality: Right;  . PHOTOCOAGULATION WITH LASER Right 06/18/2017   Procedure: PHOTOCOAGULATION WITH LASER- ENDO LASER;  Surgeon: Sherlynn Stalls, MD;  Location: [facility];  Service: Ophthalmology;  Laterality: Right;  . POSTERIOR LUMBAR FUSION  ~ 1989   "fused 2 discs"  . TIBIA FRACTURE SURGERY Left 1943   "put artificial bone in there"   Social History:  reports that he has quit smoking. His smoking use included cigars. He quit after 29.00 years of use. He has never used smokeless tobacco. He reports previous alcohol use. He reports that he does not use drugs. Patient lives at home  Allergies  Allergen Reactions  . Morphine     "increased white corpuscles" Can take codeine  . Ramipril Other (See Comments)    REACTION: cough if dose is over 2.5mg     Family History  Problem Relation Age of Onset  . Hypertension Mother   . Hypertension Father   . Hypertension Other   . Colon cancer Other        1st degree relative <50      Prior to Admission medications   Medication Sig Start Date End Date Taking? Authorizing Provider  atorvastatin (LIPITOR) 40 MG tablet TAKE 1 TABLET DAILY Patient taking differently: Take 40 mg by mouth daily. 08/22/20  Yes Nahser, Wonda Cheng, MD  carvedilol (COREG) 12.5 MG tablet Take one half tablet (6.25 mg) two times daily by mouth. Patient taking differently: Take 6.25 mg by mouth 2 (two) times daily with a meal. Take one half tablet (6.25 mg) two times daily by mouth. 11/28/20   Yes Richardson Dopp T, PA-C  Cholecalciferol 1000 UNITS tablet Take 1,000 Units by mouth daily.   Yes [provider]  clopidogrel (PLAVIX) 75 MG tablet TAKE 1 TABLET DAILY Patient taking differently: Take 75 mg by mouth daily. 09/26/20  Yes Burnell Blanks, MD  furosemide (LASIX) 40 MG tablet Take one and a half tablets daily(60mg ), with an extra tablet(40mg ) in the afternoons on Monday, Wednesday, Friday and Saturday 12/07/20  Yes Nahser, Wonda Cheng, MD  glycopyrrolate (ROBINUL-FORTE) 2 MG tablet Take 1 tablet (2 mg total) by mouth 3 (three) times daily as needed (gallbladder spasms). 12/26/20  Yes Plotnikov, Evie Lacks, MD  isosorbide mononitrate (IMDUR) 120 MG 24 hr tablet TAKE 1 TABLET DAILY Patient taking differently: Take 120 mg by mouth daily. 05/15/20  Yes Nahser, Wonda Cheng, MD  Multiple Vitamins-Minerals (PRESERVISION AREDS 2) CAPS Take 1 capsule by mouth daily.    Yes [provider]  nitroGLYCERIN (NITROSTAT) 0.4 MG SL tablet Place  1 tablet (0.4 mg total) under the tongue every 5 (five) minutes as needed for chest pain. 11/28/20 02/26/21 Yes Weaver, Scott T, PA-C  potassium chloride (KLOR-CON) 10 MEQ tablet TAKE 1 TABLET DAILY Patient taking differently: Take 10 mEq by mouth daily. 08/22/20  Yes Nahser, Wonda Cheng, MD  ranolazine (RANEXA) 1000 MG SR tablet TAKE 1 TABLET TWICE A DAY Patient taking differently: Take 1,000 mg by mouth 2 (two) times daily. 07/17/20  Yes Nahser, Wonda Cheng, MD  traZODone (DESYREL) 50 MG tablet Take 0.5-1 tablets (25-50 mg total) by mouth at bedtime as needed for sleep. 01/05/20  Yes Plotnikov, Evie Lacks, MD  acetaminophen-codeine (TYLENOL #3) 300-30 MG tablet TAKE ONE-HALF TO 1 TABLET BY MOUTH EVERY 4 HOURS AS NEEDED FOR PAIN 12/20/20   Plotnikov, Evie Lacks, MD  amoxicillin-clavulanate (AUGMENTIN) 875-125 MG tablet Take 1 tablet by mouth 2 (two) times daily. 12/26/20   Plotnikov, Evie Lacks, MD  ondansetron (ZOFRAN) 4 MG tablet Take 1 tablet (4 mg total)  by mouth every 8 (eight) hours as needed for nausea or vomiting. 10/31/20   Plotnikov, Evie Lacks, MD    Physical Exam: BP 98/63   Pulse 73   Temp (!) 97.2 F (36.2 C) (Oral)   Resp (!) 25   Ht 5\' 7"  (1.702 m)   Wt 72.1 kg   SpO2 100%   BMI 24.90 kg/m   . General: Elderly male. Awake and alert and oriented x3. No acute cardiopulmonary distress.  Marland Kitchen HEENT: Normocephalic atraumatic.  Right and left ears normal in appearance.  Pupils equal, round, reactive to light. Extraocular muscles are intact. Sclerae anicteric and noninjected.  Moist mucosal membranes. No mucosal lesions.  . Neck: Neck supple without lymphadenopathy. No carotid bruits. No masses palpated.  . Cardiovascular: Regular rate with normal S1-S2 sounds. No murmurs, rubs, gallops auscultated.  Increased JVD.  3+ pitting edema . Respiratory: Poor air movement.  Rales in bases bilaterally. No accessory muscle use. . Abdomen: Soft, nontender, nondistended. Active bowel sounds. No masses or hepatosplenomegaly  . Skin: No rashes, lesions, or ulcerations.  Dry, warm to touch. 2+ dorsalis pedis and radial pulses. . Musculoskeletal: No calf or leg pain. All major joints not erythematous nontender.  No upper or lower joint deformation.  Good ROM.  No contractures  . Psychiatric: Intact judgment and insight. Pleasant and cooperative. . Neurologic: No focal neurological deficits. Strength is 5/5 and symmetric in upper and lower extremities.  Cranial nerves II through XII are grossly intact.           Labs on Admission: I have personally reviewed following labs and imaging studies  CBC: Recent Labs  Lab Jan 20, 2021 1628  WBC 9.7  HGB 8.7*  HCT 27.8*  MCV 82.2  PLT 431   Basic Metabolic Panel: Recent Labs  Lab 01/20/2021 1628  NA 122*  K 5.1  CL 91*  CO2 18*  GLUCOSE 115*  BUN 56*  CREATININE 2.23*  CALCIUM 8.5*  MG 2.3   GFR: Estimated Creatinine Clearance: 21 mL/min (A) (by C-G formula based on SCr of 2.23 mg/dL  (H)). Liver Function Tests: Recent Labs  Lab 2021-01-20 1628  AST 175*  ALT 211*  ALKPHOS 215*  BILITOT 1.9*  PROT 6.6  ALBUMIN 3.4*   Recent Labs  Lab 2021/01/20 1628  LIPASE 24   No results for input(s): AMMONIA in the last 168 hours. Coagulation Profile: No results for input(s): INR, PROTIME in the last 168 hours. Cardiac Enzymes: No results for  input(s): CKTOTAL, CKMB, CKMBINDEX, TROPONINI in the last 168 hours. BNP (last 3 results) Recent Labs    05/22/20 1129  PROBNP 5,067*   HbA1C: No results for input(s): HGBA1C in the last 72 hours. CBG: No results for input(s): GLUCAP in the last 168 hours. Lipid Profile: No results for input(s): CHOL, HDL, LDLCALC, TRIG, CHOLHDL, LDLDIRECT in the last 72 hours. Thyroid Function Tests: No results for input(s): TSH, T4TOTAL, FREET4, T3FREE, THYROIDAB in the last 72 hours. Anemia Panel: No results for input(s): VITAMINB12, FOLATE, FERRITIN, TIBC, IRON, RETICCTPCT in the last 72 hours. Urine analysis:    Component Value Date/Time   COLORURINE YELLOW 10/31/2020 1346   APPEARANCEUR CLEAR 10/31/2020 1346   LABSPEC <=1.005 (A) 10/31/2020 1346   PHURINE 5.5 10/31/2020 1346   GLUCOSEU NEGATIVE 10/31/2020 1346   HGBUR NEGATIVE 10/31/2020 1346   BILIRUBINUR NEGATIVE 10/31/2020 1346   KETONESUR NEGATIVE 10/31/2020 1346   UROBILINOGEN 0.2 10/31/2020 1346   NITRITE NEGATIVE 10/31/2020 1346   LEUKOCYTESUR NEGATIVE 10/31/2020 1346   Sepsis Labs: @LABRCNTIP (procalcitonin:4,lacticidven:4) ) Recent Results (from the past 240 hour(s))  Resp Panel by RT-PCR (Flu A&B, Covid) Nasopharyngeal Swab     Status: None   Collection Time: 12/25/2020  3:01 PM   Specimen: Nasopharyngeal Swab; Nasopharyngeal(NP) swabs in vial transport medium  Result Value Ref Range Status   SARS Coronavirus 2 by RT PCR NEGATIVE NEGATIVE Final    Comment: (NOTE) SARS-CoV-2 target nucleic acids are NOT DETECTED.  The SARS-CoV-2 RNA is generally detectable in upper  respiratory specimens during the acute phase of infection. The lowest concentration of SARS-CoV-2 viral copies this assay can detect is 138 copies/mL. A negative result does not preclude SARS-Cov-2 infection and should not be used as the sole basis for treatment or other patient management decisions. A negative result may occur with  improper specimen collection/handling, submission of specimen other than nasopharyngeal swab, presence of viral mutation(s) within the areas targeted by this assay, and inadequate number of viral copies(<138 copies/mL). A negative result must be combined with clinical observations, patient history, and epidemiological information. The expected result is Negative.  Fact Sheet for Patients:  EntrepreneurPulse.com.au  Fact Sheet for Healthcare Providers:  IncredibleEmployment.be  This test is no t yet approved or cleared by the [place] FDA and  has been authorized for detection and/or diagnosis of SARS-CoV-2 by FDA under an Emergency Use Authorization (EUA). This EUA will remain  in effect (meaning this test can be used) for the duration of the COVID-19 declaration under Section 564(b)(1) of the Act, 21 U.S.C.section 360bbb-3(b)(1), unless the authorization is terminated  or revoked sooner.       Influenza A by PCR NEGATIVE NEGATIVE Final   Influenza B by PCR NEGATIVE NEGATIVE Final    Comment: (NOTE) The Xpert Xpress SARS-CoV-2/FLU/RSV plus assay is intended as an aid in the diagnosis of influenza from Nasopharyngeal swab specimens and should not be used as a sole basis for treatment. Nasal washings and aspirates are unacceptable for Xpert Xpress SARS-CoV-2/FLU/RSV testing.  Fact Sheet for Patients: EntrepreneurPulse.com.au  Fact Sheet for Healthcare Providers: IncredibleEmployment.be  This test is not yet approved or cleared by the [place] FDA and has been  authorized for detection and/or diagnosis of SARS-CoV-2 by FDA under an Emergency Use Authorization (EUA). This EUA will remain in effect (meaning this test can be used) for the duration of the COVID-19 declaration under Section 564(b)(1) of the Act, 21 U.S.C. section 360bbb-3(b)(1), unless the authorization is terminated or revoked.  Performed at [facility], [address], [place], [place] [address]      Radiological Exams on Admission: DG Chest Portable 1 View  Result Date: 12/25/2020 CLINICAL DATA:  Patient complains of fluid overload for 3 weeks. EXAM: PORTABLE CHEST 1 VIEW COMPARISON:  December 29, 2017 FINDINGS: Stable cardiomegaly and AICD device. Sternotomy wires are intact. No pneumothorax. No overt edema. There appear to be small pleural effusions, right greater than left with underlying atelectasis. No other acute abnormalities. IMPRESSION: 1. Cardiomegaly without overt edema. 2. Right greater than left small pleural effusions. Electronically Signed   By: Dorise Bullion III M.D   On: 12/22/2020 15:29    EKG: Independently reviewed.  Paced rhythm  Assessment/Plan: Active Problems:   Hypertensive heart disease   Ischemic cardiomyopathy   Hx of CABG   Chronic renal insufficiency, stage III (moderate) (HCC)   CAD (coronary artery disease)   OSA (obstructive sleep apnea)   HLD (hyperlipidemia)   Acute on chronic combined systolic (congestive) and diastolic (congestive) heart failure (HCC)   Hyponatremia   Acute kidney injury superimposed on chronic kidney disease (HCC)   Elevated LFTs    This patient was discussed with the ED physician, including pertinent vitals, physical exam findings, labs, and imaging.  We also discussed care given by the ED provider.  1. Acute on chronic combined systolic diastolic heart failure Telemetry monitoring Strict I/O Daily Weights Diuresis: Lasix 80mg  IV Potassium: 40 mEq twice a day by mouth Echocardiogram done within last 3 months  tomorrow Repeat BMP tomorrow We will involve cardiology 2. Hyponatremia a. Likely secondary to fluid overload b. Fluid restrict and diurese 3. Elevated LFTs with cholelithiasis a. Check right upper quadrant ultrasound b. Check LFTs in a.m. 4. Acute kidney injury on stage III chronic kidney disease a. Monitor creatinine 5. Coronary artery disease with history of CABG and ischemic cardiomyopathy a. Continue carvedilol, although reduce and hold tonight's dose b. Hold Imdur as patient is mildly hypotensive. 6. Hyperlipidemia 7. Obstructive sleep apnea a. CPAP tonight  DVT prophylaxis: Lovenox Consultants: Cardiology Code Status: Full code Family Communication: Wife present during interview and exam Disposition Plan: Patient should be able to return home following diuresis   Truett Mainland, DO

## 2021-01-07 ENCOUNTER — Inpatient Hospital Stay (HOSPITAL_COMMUNITY): Payer: Medicare Other

## 2021-01-07 DIAGNOSIS — I255 Ischemic cardiomyopathy: Secondary | ICD-10-CM

## 2021-01-07 DIAGNOSIS — G4733 Obstructive sleep apnea (adult) (pediatric): Secondary | ICD-10-CM

## 2021-01-07 DIAGNOSIS — N179 Acute kidney failure, unspecified: Secondary | ICD-10-CM

## 2021-01-07 DIAGNOSIS — I5043 Acute on chronic combined systolic (congestive) and diastolic (congestive) heart failure: Secondary | ICD-10-CM

## 2021-01-07 DIAGNOSIS — R7989 Other specified abnormal findings of blood chemistry: Secondary | ICD-10-CM

## 2021-01-07 DIAGNOSIS — N1832 Chronic kidney disease, stage 3b: Secondary | ICD-10-CM

## 2021-01-07 LAB — HEPATITIS B SURFACE ANTIGEN: Hepatitis B Surface Ag: NONREACTIVE

## 2021-01-07 LAB — COMPREHENSIVE METABOLIC PANEL
ALT: 644 U/L — ABNORMAL HIGH (ref 0–44)
AST: 688 U/L — ABNORMAL HIGH (ref 15–41)
Albumin: 3.3 g/dL — ABNORMAL LOW (ref 3.5–5.0)
Alkaline Phosphatase: 231 U/L — ABNORMAL HIGH (ref 38–126)
Anion gap: 14 (ref 5–15)
BUN: 59 mg/dL — ABNORMAL HIGH (ref 8–23)
CO2: 19 mmol/L — ABNORMAL LOW (ref 22–32)
Calcium: 8.7 mg/dL — ABNORMAL LOW (ref 8.9–10.3)
Chloride: 90 mmol/L — ABNORMAL LOW (ref 98–111)
Creatinine, Ser: 2.47 mg/dL — ABNORMAL HIGH (ref 0.61–1.24)
GFR, Estimated: 24 mL/min — ABNORMAL LOW (ref >60)
Glucose, Bld: 93 mg/dL (ref 70–99)
Potassium: 5.8 mmol/L — ABNORMAL HIGH (ref 3.5–5.1)
Sodium: 123 mmol/L — ABNORMAL LOW (ref 135–145)
Total Bilirubin: 2.9 mg/dL — ABNORMAL HIGH (ref 0.3–1.2)
Total Protein: 6.2 g/dL — ABNORMAL LOW (ref 6.5–8.1)

## 2021-01-07 LAB — GLUCOSE, CAPILLARY: Glucose-Capillary: 113 mg/dL — ABNORMAL HIGH (ref 70–99)

## 2021-01-07 LAB — HEPATITIS C ANTIBODY: HCV Ab: NONREACTIVE

## 2021-01-08 MED FILL — Medication: Qty: 1 | Status: AC

## 2021-01-13 NOTE — Consult Note (Signed)
Cardiology Consultation:   Patient ID: [name] [name]. MRN: [id number]; DOB: [birth year]  Admit date: 12/27/2020 Date of Consult: 01/26/2021  PCP:  [name] Anger, [name]   [name] [name]  Cardiologist:  [name] Moores, [name]  Advanced Practice Provider:  Liliane Shi, PA-C Electrophysiologist:  [name] Axe, [name]  6}    Patient Profile:   [name] [name]. is a 87 y.o. male with a hx of CAD, ICM who is being seen today for the evaluation of CHF at the request of [name] [name].  History of Present Illness:   [name] [name] is an 87 yo male patient with history of CABG 1992, DES x2 RI 2017, LIMA-LAD ok, SVG-ramus and SVG-RCA 100% occluded, ICM EF 30-35%, grade 3 DD mod MR 10/2020, chronotropic incompetence s/p ICD/pacer, history of syncope due to hypotension, OSA, HLD NSVT, CKD.  Patient has had recent trouble with increased DOE and fluid overload and [name] [name] felt we had to accept higher Crt in order to keep him comfortable. Denied clearance for gallbladder surgery. Comes in now with Acute on chronic systolic CHF. Also ongoing gallbladder trouble but hasn'[name] had surgery with ongoing heart issues.  Patient has diuresed 1L so far, Crt 2.23 yesst 2.47 today, LFT's continue to rise, bilit 2.9, Na 123, K 5.8. Sitting at the side of the bed eating breakfast. Appears SOB. Because of hypotension all meds stopped except lasix and ranexa. He was told he could have more salt which most likely contributed to recurrent fluid overload. Denies chest pain but has used NTG spray when he's very SOB with some relief.   Past Medical History:  Diagnosis Date  . AICD (automatic cardioverter/defibrillator) present   . Arrhythmia   . Arthritis    "hands, legs" (03/26/2016)  . CAD (coronary artery disease)    hx apical aneurysm/cath 05/2005, old occluded graft to RCA, no change/ cath 02/2007 no change, myoview 06/2009 old scar, no ischemia EF 43%, EF 35-40%-echo 05/2008 akinesis periapical wall //  LHC  7/17: mLAD 100, oRI 99, pRCA 100, L-LAD ok, S-RI 100, S-RCA 100, EF 35-45% >> PCI: DES x 2 to RI  . Chronic systolic CHF (congestive heart failure) ([place])    a. Echo 9/14: Mild LVH, mild focal basal septal hypertrophy, EF 35-40%, anteroseptal HK, normal diastolic function, mild MR, mild LAE, PASP 34 mmHg  . Chronotropic incompetence    treated w/pacemaker, ICD 7.2008. This was placed after CPX done post 2008 cath. CPX showed only chronotropic incompetence  . History of blood transfusion 1969   "after getting MSO4; it destroys my corpuscles" (03/26/2016)  . History of colon polyps   . Hyperlipidemia   . Hypertension   . Migraine    "just before my bypass" (03/26/2016)  . Mitral regurgitation    mild  . Myocardial infarction ([place]) "several"  . Numbness and tingling of left arm and leg    improved after tx w/prednisone  . OSA (obstructive sleep apnea) 09/01/2016   Severe OSA with AHI 38/hr now on BiPAP at 21/17cm H2O  . Syncopal episodes    relative hypotension    Past Surgical History:  Procedure Laterality Date  . BACK SURGERY    . CARDIAC CATHETERIZATION  1980s-1990s X 4  . CARDIAC CATHETERIZATION N/A 03/26/2016   Procedure: Left Heart Cath and Cors/Grafts Angiography;  Surgeon: [name] [name];  Location: [place];  Service: Cardiovascular;  Laterality: N/A;  . CARDIAC CATHETERIZATION N/A 03/26/2016   Procedure:  Coronary Stent Intervention;  Surgeon: [name] [name];  Location: [place] [facility];  Service: Cardiovascular;  Laterality: N/A;  . CARDIAC DEFIBRILLATOR PLACEMENT  2008  . CATARACT EXTRACTION W/ INTRAOCULAR LENS  IMPLANT, BILATERAL Bilateral 08/2013 - 09/2013   right - left   . CORONARY ANGIOPLASTY WITH STENT PLACEMENT  ~ 1992; 03/26/2016   "1 + 2"  . CORONARY ARTERY BYPASS GRAFT  1992   CABG "X4"  . CYSTOSCOPY  1980s  . GAS/FLUID EXCHANGE Right 06/18/2017   Procedure: GAS/FLUID EXCHANGE (C3F8);  Surgeon: [name] Stalls, [name];  Location: [facility];  Service: Ophthalmology;  Laterality: Right;  . IMPLANTABLE CARDIOVERTER DEFIBRILLATOR (ICD) GENERATOR CHANGE N/A 06/21/2014   Procedure: ICD GENERATOR CHANGE;  Surgeon: [name] Sprang, [name];  Location: [facility];  Service: Cardiovascular;  Laterality: N/A;  . PARS PLANA VITRECTOMY Right 06/18/2017   Procedure: PARS PLANA VITRECTOMY WITH 25 GAUGE;  Surgeon: [name] Stalls, [name];  Location: [facility];  Service: Ophthalmology;  Laterality: Right;  . PHOTOCOAGULATION WITH LASER Right 06/18/2017   Procedure: PHOTOCOAGULATION WITH LASER- ENDO LASER;  Surgeon: [name] Stalls, [name];  Location: [facility];  Service: Ophthalmology;  Laterality: Right;  . POSTERIOR LUMBAR FUSION  ~ 1989   "fused 2 discs"  . TIBIA FRACTURE SURGERY Left 1943   "put artificial bone in there"     Home Medications:  Prior to Admission medications   Medication Sig Start Date End Date Taking? Authorizing Provider  atorvastatin (LIPITOR) 40 MG tablet TAKE 1 TABLET DAILY Patient taking differently: Take 40 mg by mouth daily. 08/22/20  Yes Nahser, [name] Cheng, [name]  carvedilol (COREG) 12.5 MG tablet Take one half tablet (6.25 mg) two times daily by mouth. Patient taking differently: Take 6.25 mg by mouth 2 (two) times daily with a meal. Take one half tablet (6.25 mg) two times daily by mouth. 11/28/20  Yes [name] [name] T, PA-C  Cholecalciferol 1000 UNITS tablet Take 1,000 Units by mouth daily.   Yes Provider, Historical, [name]  clopidogrel (PLAVIX) 75 MG tablet TAKE 1 TABLET DAILY Patient taking differently: Take 75 mg by mouth daily. 09/26/20  Yes [name] [name]  furosemide (LASIX) 40 MG tablet Take one and a half tablets daily(56m), with an extra tablet(473m in the afternoons on Monday, Wednesday, Friday and Saturday 12/07/20  Yes Nahser, [name] [name]  glycopyrrolate (ROBINUL-FORTE) 2 MG tablet Take 1 tablet (2 mg total) by mouth 3 (three) times daily as needed (gallbladder spasms). 12/26/20  Yes [name] [name], AlEvie LacksMD  isosorbide  mononitrate (IMDUR) 120 MG 24 hr tablet TAKE 1 TABLET DAILY Patient taking differently: Take 120 mg by mouth daily. 05/15/20  Yes Nahser, [name] [name]  Multiple Vitamins-Minerals (PRESERVISION AREDS 2) CAPS Take 1 capsule by mouth daily.    Yes Provider, Historical, [name]  nitroGLYCERIN (NITROSTAT) 0.4 MG SL tablet Place 1 tablet (0.4 mg total) under the tongue every 5 (five) minutes as needed for chest pain. 11/28/20 02/26/21 Yes Weaver, Scott T, PA-C  potassium chloride (KLOR-CON) 10 MEQ tablet TAKE 1 TABLET DAILY Patient taking differently: Take 10 mEq by mouth daily. 08/22/20  Yes Nahser, [name] [name]  ranolazine (RANEXA) 1000 MG SR tablet TAKE 1 TABLET TWICE A DAY Patient taking differently: Take 1,000 mg by mouth 2 (two) times daily. 07/17/20  Yes Nahser, [name] [name]  traZODone (DESYREL) 50 MG tablet Take 0.5-1 tablets (25-50 mg total) by mouth at bedtime as needed for sleep. 01/05/20  Yes [name] [name], AlEvie Lacks  [name]  acetaminophen-codeine (TYLENOL #3) 300-30 MG tablet TAKE ONE-HALF TO 1 TABLET BY MOUTH EVERY 4 HOURS AS NEEDED FOR PAIN 12/20/20   [name] [name], [name] Lacks, [name]  amoxicillin-clavulanate (AUGMENTIN) 875-125 MG tablet Take 1 tablet by mouth 2 (two) times daily. 12/26/20   [name] [name], [name] Lacks, [name]  ondansetron (ZOFRAN) 4 MG tablet Take 1 tablet (4 mg total) by mouth every 8 (eight) hours as needed for nausea or vomiting. 10/31/20   [name] [name], [name] Lacks, [name]    Inpatient Medications: Scheduled Meds: . clopidogrel  75 mg Oral Daily  . enoxaparin (LOVENOX) injection  30 mg Subcutaneous Q24H  . furosemide  80 mg Intravenous Q12H  . ranolazine  1,000 mg Oral BID  . sodium chloride flush  3 mL Intravenous Q12H   Continuous Infusions: . sodium chloride     PRN Meds: sodium chloride, acetaminophen, acetaminophen-codeine, glycopyrrolate, ondansetron (ZOFRAN) IV, sodium chloride flush, traZODone  Allergies:    Allergies  Allergen Reactions  . Morphine     "increased white corpuscles" Can take  codeine  . Ramipril Other (See Comments)    REACTION: cough if dose is over 2.37m    Social History:   Social History   Socioeconomic History  . Marital status: Married    Spouse name: 1 child passed away  . Number of children: 4  . Years of education: Not on file  . Highest education level: Not on file  Occupational History  . Occupation: Retired  Tobacco Use  . Smoking status: Former Smoker    Years: 29.00    Types: Cigars  . Smokeless tobacco: Never Used  . Tobacco comment: quit smoking cigars in 1973  Vaping Use  . Vaping Use: Never used  Substance and Sexual Activity  . Alcohol use: Not Currently    Comment: "quit drinking in 1973"  . Drug use: No  . Sexual activity: Yes  Other Topics Concern  . Not on file  Social History Narrative   Widowed - 06/2010   Social Determinants of Health   Financial Resource Strain: Low Risk   . Difficulty of Paying Living Expenses: Not hard at all  Food Insecurity: No Food Insecurity  . Worried About RCharity fundraiserin the Last Year: Never true  . Ran Out of Food in the Last Year: Never true  Transportation Needs: No Transportation Needs  . Lack of Transportation (Medical): No  . Lack of Transportation (Non-Medical): No  Physical Activity: Inactive  . Days of Exercise per Week: 0 days  . Minutes of Exercise per Session: 0 min  Stress: No Stress Concern Present  . Feeling of Stress : Not at all  Social Connections: Socially Integrated  . Frequency of Communication with Friends and Family: More than three times a week  . Frequency of Social Gatherings with Friends and Family: More than three times a week  . Attends Religious Services: More than 4 times per year  . Active Member of Clubs or Organizations: No  . Attends CArchivistMeetings: More than 4 times per year  . Marital Status: Married  IHuman resources officerViolence: Not on file    Family History:     Family History  Problem Relation Age of Onset  .  Hypertension Mother   . Hypertension Father   . Hypertension Other   . Colon cancer Other        1st degree relative <50     ROS:  Please see the history of present illness.  Review of Systems  Constitutional: Positive for decreased appetite and malaise/fatigue.  HENT: Negative.   Cardiovascular: Positive for dyspnea on exertion.  Respiratory: Positive for shortness of breath, sleep disturbances due to breathing and wheezing.   Endocrine: Negative.   Hematologic/Lymphatic: Negative.   Musculoskeletal: Negative.   Gastrointestinal: Positive for bloating.  Genitourinary: Positive for frequency and hesitancy.  Neurological: Positive for light-headedness and weakness.    All other ROS reviewed and negative.     Physical Exam/Data:   Vitals:   01/05/2021 2200 02/05/21 0031 Feb 05, 2021 0213 05-Feb-2021 0535  BP:  109/62 109/64 96/77  Pulse: 77 72 70 75  Resp: _0 Temp:   97.8 F (36.6 C) 98.5 F (36.9 C)  TempSrc:   Oral   SpO2: 99%  100% 95%  Weight:      Height:        Intake/Output Summary (Last 24 hours) at 02/05/2021 1017 Last data filed at 2021-02-05 0700 Gross per 24 hour  Intake --  Output 1000 ml  Net -1000 ml   Last 3 Weights 12/27/2020 12/26/2020 12/26/2020  Weight (lbs) 159 lb 168 lb 168 lb  Weight (kg) 72.122 kg 76.204 kg 76.204 kg     Body mass index is 24.9 kg/m.  General:  Well nourished, well developed, in no acute distress HEENT: normal Lymph: no adenopathy Neck:increase JVD Endocrine:  No thryomegaly Vascular: No carotid bruits; FA pulses 2+ bilaterally without bruits  Cardiac:  normal S1, S2; RRR; 3/6 systolic murmur apex Lungs:  bibasilar rales  Abd: soft, nontender, no hepatomegaly  Ext: no edema Musculoskeletal:  No deformities, BUE and BLE strength normal and equal Skin: warm and dry  Neuro:  CNs 2-12 intact, no focal abnormalities noted Psych:  Normal affect   EKG:  The EKG was personally reviewed and demonstrates:  Paced  rhythm Telemetry:  Telemetry was personally reviewed and demonstrates: paced rhythm  Relevant CV Studies:  Echo 10/26/20 IMPRESSIONS     1. Left ventricular ejection fraction, by estimation, is 30 to 35%. The  left ventricle has moderately decreased function. The left ventricle  demonstrates regional wall motion abnormalities (see scoring  diagram/findings for description). Left ventricular   diastolic parameters are consistent with Grade III diastolic dysfunction  (restrictive). Elevated left ventricular end-diastolic pressure. There is  akinesis of the left ventricular, mid inferoseptal wall and anteroseptal  wall. There is akinesis of the  left ventricular, apical anterior wall. There is akinesis of the left  ventricular, entire inferior wall and apical segment.   2. Right ventricular systolic function is moderately reduced. The right  ventricular size is normal. There is mildly elevated pulmonary artery  systolic pressure. The estimated right ventricular systolic pressure is  61.4 mmHg.   3. Left atrial size was mildly dilated.   4. Right atrial size was moderately dilated.   5. The mitral valve is normal in structure. Moderate mitral valve  regurgitation. No evidence of mitral stenosis.   6. Tricuspid valve regurgitation is mild to moderate.   7. The aortic valve is tricuspid. There is moderate calcification of the  aortic valve. There is moderate thickening of the aortic valve. Aortic  valve regurgitation is not visualized. Mild to moderate aortic valve  sclerosis/calcification is present,  without any evidence of aortic stenosis. Aortic valve area, by VTI  measures 2.45 cm. Aortic valve mean gradient measures 5.0 mmHg. Aortic  valve Vmax measures 1.47 m/s.   8. Aortic dilatation noted. There is mild dilatation  of the ascending  aorta, measuring 38 mm.   9. The inferior vena cava is dilated in size with <50% respiratory  variability, suggesting right atrial pressure of 15  mmHg.    Echocardiogram 03/15/2019 EF 30-35, mild LVH, Gr 2 DD, normal RVSF, mod LAE, mild MAC, mod MR, trivial AI   Echocardiogram 03/30/2017  Mod to severe LVH, EF 30-35, Gr 2 DD, trivial AI, mild to mod MR, mild to mod reduced RVSF, mild increased PASP    LHC 03/26/16 LAD mid 100% CTO RI ostial 99% LCx okay RCA proximal 100% LIMA-LAD okay SVG-lateral ramus 100% SVG-distal RCA 100% CTO EF 35-45% PCI: 2.25 x 28 and 2.25 x 16 mm Promus Premier DES to the RI        Echo 9/14 Mild LVH, mild focal basal septal hypertrophy, EF 35-40%, anteroseptal HK, normal diastolic function, mild MR, mild LAE, PASP 34 mmHg   2/22 US IMPRESSION: 1. Suspicion of gallbladder contracted around a 9 mm stone in the neck. But no pericholecystic fluid or sonographic Murphy's sign to strongly suggest acute cholecystitis.   2. CBD is at the upper limits of normal to mildly enlarged, but there is no intrahepatic biliary ductal dilatation to corroborate acute bile duct obstruction.     Laboratory Data:  High Sensitivity Troponin:   Recent Labs  Lab 01/11/2021 1628 12/14/2020 1854  TROPONINIHS 38* 30*     Chemistry Recent Labs  Lab 01/12/2021 1628 01/10/2021 2214 01-23-2021 0501  NA 122* 121* 123*  K 5.1 5.8* 5.8*  CL 91* 90* 90*  CO2 18* 17* 19*  GLUCOSE 115* 113* 93  BUN 56* 59* 59*  CREATININE 2.23* 2.36* 2.47*  CALCIUM 8.5* 8.4* 8.7*  GFRNONAA 27* 26* 24*  ANIONGAP _0 Recent Labs  Lab 12/20/2020 1628 01/23/2021 0501  PROT 6.6 6.2*  ALBUMIN 3.4* 3.3*  AST 175* 688*  ALT 211* 644*  ALKPHOS 215* 231*  BILITOT 1.9* 2.9*   Hematology Recent Labs  Lab 01/04/2021 1628  WBC 9.7  RBC 3.38*  HGB 8.7*  HCT 27.8*  MCV 82.2  MCH 25.7*  MCHC 31.3  RDW 18.5*  PLT 302   BNP Recent Labs  Lab 12/30/2020 1628  BNP 1,495.0*    DDimer No results for input(s): DDIMER in the last 168 hours.   Radiology/Studies:  DG Chest Portable 1 View  Result Date: 12/16/2020 CLINICAL DATA:   Patient complains of fluid overload for 3 weeks. EXAM: PORTABLE CHEST 1 VIEW COMPARISON:  December 29, 2017 FINDINGS: Stable cardiomegaly and AICD device. Sternotomy wires are intact. No pneumothorax. No overt edema. There appear to be small pleural effusions, right greater than left with underlying atelectasis. No other acute abnormalities. IMPRESSION: 1. Cardiomegaly without overt edema. 2. Right greater than left small pleural effusions. Electronically Signed   By: Dorise Bullion III M.D   On: 12/16/2020 15:29     Assessment and Plan:   Acute on chronic combined systolic and diastolic CHF EF 93-26% with grade 3 DD on echo 10/2020, BNP~ 1500 diuresed 1L since admission but Crt 2.47, on Lasix 80 mg q 12, still with fluid overoad but limited options with rising Crt, hyponatremia, increased K, hypotension, age. Would ask for renal input but ultimately may need palliative care. Would decrease lasix 80 mg IV once daily consider O2(nurse checking sats). Consider dopamine.  CAD s/p CABG 1992, DES x 2 RI 2017 see above for cath details on ranexa, plavix  CKD stage 3  Crt up to 2.47 today  Cholecystitis with 9 mm stone and rising LFT's and bili. [name] [name] didn'[name] feel he could clear him for surgery.  AICD/pacer for chonotropic incompetence  History of syncope due to hypotension  Anemia Hbg 8.7   Risk Assessment/Risk Scores:        New York Heart Association (NYHA) Functional Class NYHA Class IV        For questions or updates, please contact [facility] Please consult [email or web address] for contact info under    Signed, Ermalinda Barrios, PA-C  2021-01-13 10:17 AM

## 2021-01-13 NOTE — TOC Initial Note (Signed)
Transition of Care (TOC) - Initial/Assessment Note    Patient Details  Name: [name] [name]. MRN: [id number] Date of Birth: [birth year]  Transition of Care [facility]) CM/SW Contact:    Salome Arnt, LCSW Phone Number: 2021/02/03, 11:42 AM  Clinical Narrative: Pt admitted due to acute on chronic combined systolic diastolic heart failure. TOC received consult for CHF screening. Attempted to complete assessment with pt, but pt requested LCSW speak to his wife. Pt's wife indicates pt is fairly independent with ADLs at baseline, but it takes him more time to complete tasks due to shortness of breath. Pt ambulates with cane outside of the home. Wife plans on him returning home when medically stable. They request hospital bed and would like [facility]. Referral [facility] to [facility] with [facility]. Pt's wife also feels pt may need home O2. TOC will follow.   CHF screening completed with wife. She reports pt weighs himself daily and takes all medications as prescribed. Pt's wife cooks most meals and follows heart healthy diet. He sees cardiologist regularly.                    Expected Discharge Plan: Home/Self Care Barriers to Discharge: Continued Medical Work up   Patient Goals and CMS Choice Patient states their goals for this hospitalization and ongoing recovery are:: return home   Choice offered to / list presented to : Patient  Expected Discharge Plan and Services Expected Discharge Plan: Home/Self Care In-house Referral: Clinical Social Work     Living arrangements for the past 2 months: Single Family Home                 DME Arranged: Hospital bed DME Agency: [facility] Date DME Agency Contacted: 2021-02-03 Time DME Agency Contacted: 13 Representative spoke with at DME Agency: Freda Munro            Prior Living Arrangements/Services Living arrangements for the past 2 months: [place] Lives with:: Spouse Patient language and need for interpreter reviewed:: Yes Do you feel  safe going back to the place where you live?: Yes      Need for Family Participation in Patient Care: Yes (Comment)   Current home services: DME (cane, cpap, ramp, BSC) Criminal Activity/Legal Involvement Pertinent to Current Situation/Hospitalization: No - Comment as needed  Activities of Daily Living Home Assistive Devices/Equipment: CPAP ADL Screening (condition at time of admission) Patient's cognitive ability adequate to safely complete daily activities?: Yes Is the patient deaf or have difficulty hearing?: No Does the patient have difficulty seeing, even when wearing glasses/contacts?: No Does the patient have difficulty concentrating, remembering, or making decisions?: No Patient able to express need for assistance with ADLs?: Yes Does the patient have difficulty dressing or bathing?: No Independently performs ADLs?: Yes (appropriate for developmental age) Does the patient have difficulty walking or climbing stairs?: No Weakness of Legs: None Weakness of Arms/Hands: None  Permission Sought/Granted                  Emotional Assessment       Orientation: : Oriented to Self,Oriented to Place,Oriented to  Time,Oriented to Situation Alcohol / Substance Use: Not Applicable Psych Involvement: No (comment)  Admission diagnosis:  Hyponatremia [E87.1] Chronic anemia [D64.9] AKI (acute kidney injury) ([place]) [N17.9] Acute on chronic combined systolic (congestive) and diastolic (congestive) heart failure ([place]) [I50.43] Acute on chronic congestive heart failure, unspecified heart failure type ([place]) [I50.9] Patient Active Problem List   Diagnosis Date Noted  . Acute  renal failure superimposed on stage 3b chronic kidney disease ([place]) Jan 26, 2021  . Acute on chronic combined systolic (congestive) and diastolic (congestive) heart failure ([place]) 12/28/2020  . Hyponatremia 01/09/2021  . Acute kidney injury superimposed on chronic kidney disease ([place]) 01/02/2021  . Elevated LFTs  12/24/2020  . Cholelithiasis 12/26/2020  . Flank pain 10/31/2020  . Nausea & vomiting 10/31/2020  . Insomnia 01/05/2020  . Cat bite 07/20/2019  . DOE (dyspnea on exertion) 05/31/2019  . Edema 05/31/2019  . Varicose veins of both lower extremities without ulcer or inflammation 02/02/2018  . Allergic rhinitis 12/29/2017  . HLD (hyperlipidemia) 11/09/2017  . Left lateral epicondylitis 07/02/2017  . Knee pain, right 12/24/2016  . OSA (obstructive sleep apnea) 09/01/2016  . Status post coronary artery stent placement   . Hypertensive heart disease with heart failure ([place])   . Unstable angina ([place])   . Thumb pain 05/04/2015  . Chronic systolic CHF (congestive heart failure) ([place]) 11/18/2013  . Ejection fraction   . CAD (coronary artery disease)   . Well adult exam 07/13/2012  . Chronic renal insufficiency, stage III (moderate) ([place]) 07/21/2011  . Ischemic cardiomyopathy   . Hx of CABG   . Chronotropic incompetence   . Syncopal episodes   . Mitral regurgitation   . Numbness and tingling of left arm and leg   . Arm pain, diffuse, left 10/22/2009  . PARESTHESIA 10/22/2009  . TOBACCO USE, QUIT 10/22/2009  . SEBACEOUS CYST 08/27/2009  . Cough 02/28/2009  . Anemia, chronic disease 02/18/2008  . Dyslipidemia 05/03/2007  . Hypertensive heart disease 05/03/2007  . COLONIC POLYPS, HX OF 05/03/2007  . Automatic implantable cardioverter-defibrillator in situ 03/16/2007   PCP:  Plotnikov, Evie Lacks, MD Pharmacy:   [place], [place] [address] [place] [address] Phone: [phone] Fax: [place], [place] [address] [place] [address] Phone: [phone] Fax: [phone]     Social Determinants of Health (SDOH) Interventions    Readmission Risk Interventions No flowsheet data found.

## 2021-01-13 NOTE — Death Summary Note (Signed)
DEATH SUMMARY   Patient Details  Name: [name] [name]. MRN: [id number] DOB: Jul 07, 1929  Admission/Discharge Information   Admit Date:  24-Jan-2021  Date of Death: Date of Death: 25-Jan-2021  Time of Death: Time of Death: 1249/11/28  Length of Stay: 1  Referring Physician: Cassandria Anger, MD   Reason(s) for Hospitalization  sob  Diagnoses  Preliminary cause of death: Congestive Heart Failure Secondary Diagnoses (including complications and co-morbidities): Acute respiratory failure with hypoxia -Secondary to pulmonary edema -Currently stable on 2 L nasal cannula -Wean oxygen for saturation greater 90% -Personally reviewed chest x-ray--Increased interstitial markings  Acute on chronic combined CHF -10/26/2020 echo EF 30-35%, grade 3 DD, +WMA; moderate MR, mild to moderate TR -Continue IV furosemide 80 mg IV twice daily -Holding carvedilol secondary to soft blood pressures and acute decompensation -Cardiology consulted->overall poor prognosis; not a candidate for advanced CHF therapy; discuss with family for more palliation approach  Hyperkalemia -Holding potassium supplementation -anticipate improvement with lasix IV -remain on telemetry  Acute on chronic renal failure--CKD stage IIIb -Baseline creatinine 1.8-2.0 -Presented with serum creatinine 2.36 -We will need to tolerate increased serum creatinine for improved euvolemia -Monitor BMP  Transaminasemia -In part due to hepatic congestion from decompensated CHF and hemodynamic changes (soft/low BPs) -Concerned about possible cholecystitis with history of cholelithiasis -11/02/2020 RUQ US--GB contracted around 86m stone in the neck without pericholecystic fluid or Murphy sign.  Common bile duct 7 mm -Repeat right upper quadrant ultrasound  Hyponatremia -Secondary to fluid overload -Anticipate improvement with diuresis  Hyperlipidemia -Holding Lipitor secondary to elevated LFTs  Coronary artery disease -No chest  pain presently -Continue Ranexa -Personally reviewed EKG--paced rhythm -continue plavix -restart imdur if BP can tolerate  OSA -continue CPAP hs    Brief Hospital Course (including significant findings, care, treatment, and services provided and events leading to death)  [name] [name] is a 87year old male with a history of coronary artery disease with history of CABG, systolic and diastolic CHF status post AICD, hypertension, hyperlipidemia, OSA, CKD stage III presenting with 3-week history of shortness of breath, dyspnea on exertion, and orthopnea type symptoms.  The patient states that he has had increasing lower extremity edema and increasing abdominal girth.  He endorses compliance with his furosemide.  He states that he drinks at least 2 x 20 ounce bottles of water on a daily basis and according other fluid intake.  He states that his last weight at home was 159 pounds on 01/04/2021.  He contacted his cardiologist who recommended the patient come to the hospital for further evaluation.  He states that his furosemide dose has not been changed recently.  He has been on Lasix 60 mg a.m., 40 mg p.m. reviewed the medical record shows his 40 mg p.m. dose was added on 12/07/2020 when he saw cardiology in the office.  His office weight at that time was 167 pounds.  He denies any fevers, chills, headache, neck pain, hemoptysis. Notably, the patient has had some intermittent nausea and vomiting.  He denies any frank abdominal pain.  He states that he has had problems with his gallbladder in the past, but could not undergo operation secondary to his "bad heart". He denies any diarrhea, hematochezia, melena.  In the emergency department, the patient was afebrile hemodynamically stable with oxygen saturation 89% on room air.  BMP showed a sodium 121, potassium 5.8, serum creatinine 2.23.  AST 175, ALT 211, alk phosphatase 215, total bilirubin 1.9.  WBC 9.7, hemoglobin 8.7,  platelets 202,000.  Troponin  38>>30.  Chest x-ray showed increased interstitial markings and small bilateral effusions, right greater than left.  Patient was placed on 2 L nasal cannula with saturation up to 100%.  He was started on IV lasix 80 mg IV bid.  Later in the morning of 01/12/21, the patient ultimately removed his CPAP to eat breakfast and go through is morning routine.  He had intermittent episodes of sob/dyspnea on exertion with minimal exertion.  Once he rested he improved.  He was seen by cardiology as discussed above.  At some point, Patient was getting back into bed from sitting up when he became apneic and unresponsive.  No pulse was palpated.  ACLS protocol was initiated.  Dr. Laverta Baltimore present to assist in providing airway and intubated patient.  Cardiologist also present to assist--had consulted on patient earlier in the day and stated patient was not a candidate for advanced CHF therapy. After about 15-20 minutes of ACLS protocol, spouse requested to speak with me.  I spoke with the spouse whom stated she did want him to suffer.  I updated her on the situation and told her that the patient's prognosis was very poor even if he would to have ROSC.  I discussed that we as a medical team were "doing everything" and I discussed that cardiology felt his prognosis to be poor even before the code blue and that he was not a candidate for advanced cardiac therapies..  She stated that he would have not wanted to be in a state with no quality of life and she did not want him to suffer.  After further discussion, she stated that she wished for the ACLS protocol to be terminated and for the focus to be soley comfort.     Pertinent Labs and Studies  Significant Diagnostic Studies DG Chest Portable 1 View  Result Date: 12/20/2020 CLINICAL DATA:  Patient complains of fluid overload for 3 weeks. EXAM: PORTABLE CHEST 1 VIEW COMPARISON:  December 29, 2017 FINDINGS: Stable cardiomegaly and AICD device. Sternotomy wires are intact. No  pneumothorax. No overt edema. There appear to be small pleural effusions, right greater than left with underlying atelectasis. No other acute abnormalities. IMPRESSION: 1. Cardiomegaly without overt edema. 2. Right greater than left small pleural effusions. Electronically Signed   By: Dorise Bullion III M.D   On: 12/24/2020 15:29   US Abdomen Limited RUQ (LIVER/GB)  Result Date: 01-12-2021 CLINICAL DATA:  Elevated liver enzymes EXAM: ULTRASOUND ABDOMEN LIMITED RIGHT UPPER QUADRANT COMPARISON:  Abdominal ultrasound November 02, 2020. FINDINGS: Gallbladder: Gallbladder is contracted consistent with postprandial state. There are echogenic foci in the gallbladder consistent with gallstones. Largest gallstone measures 1.9 cm in length. No pericholecystic fluid. No sonographic Murphy sign noted by sonographer. Common bile duct: Diameter: 6 mm Liver: No focal lesion identified. Within normal limits in parenchymal echogenicity. Portal vein is patent on color Doppler imaging with normal direction of blood flow towards the liver. Other: There is mild ascites. Right kidney is atrophic. There is a cyst in the mid right kidney measuring 2.0 x 1.9 x 1.9 cm. IMPRESSION: 1. Cholelithiasis. Gallbladder is contracted which may be due to postprandial state. 2.  Mild ascites. 3. Atrophic right kidney. Cyst in right kidney measuring 2.0 x 1.9 x 1.9 cm. Electronically Signed   By: Lowella Grip III M.D.   On: 2021-01-12 11:43    Microbiology Recent Results (from the past 240 hour(s))  Resp Panel by RT-PCR (Flu A&B, Covid) Nasopharyngeal Swab  Status: None   Collection Time: 12/21/2020  3:01 PM   Specimen: Nasopharyngeal Swab; Nasopharyngeal(NP) swabs in vial transport medium  Result Value Ref Range Status   SARS Coronavirus 2 by RT PCR NEGATIVE NEGATIVE Final    Comment: (NOTE) SARS-CoV-2 target nucleic acids are NOT DETECTED.  The SARS-CoV-2 RNA is generally detectable in upper respiratory specimens during the  acute phase of infection. The lowest concentration of SARS-CoV-2 viral copies this assay can detect is 138 copies/mL. A negative result does not preclude SARS-Cov-2 infection and should not be used as the sole basis for treatment or other patient management decisions. A negative result may occur with  improper specimen collection/handling, submission of specimen other than nasopharyngeal swab, presence of viral mutation(s) within the areas targeted by this assay, and inadequate number of viral copies(<138 copies/mL). A negative result must be combined with clinical observations, patient history, and epidemiological information. The expected result is Negative.  Fact Sheet for Patients:  EntrepreneurPulse.com.au  Fact Sheet for Healthcare Providers:  IncredibleEmployment.be  This test is no t yet approved or cleared by the [place] FDA and  has been authorized for detection and/or diagnosis of SARS-CoV-2 by FDA under an Emergency Use Authorization (EUA). This EUA will remain  in effect (meaning this test can be used) for the duration of the COVID-19 declaration under Section 564(b)(1) of the Act, 21 U.S.C.section 360bbb-3(b)(1), unless the authorization is terminated  or revoked sooner.       Influenza A by PCR NEGATIVE NEGATIVE Final   Influenza B by PCR NEGATIVE NEGATIVE Final    Comment: (NOTE) The Xpert Xpress SARS-CoV-2/FLU/RSV plus assay is intended as an aid in the diagnosis of influenza from Nasopharyngeal swab specimens and should not be used as a sole basis for treatment. Nasal washings and aspirates are unacceptable for Xpert Xpress SARS-CoV-2/FLU/RSV testing.  Fact Sheet for Patients: EntrepreneurPulse.com.au  Fact Sheet for Healthcare Providers: IncredibleEmployment.be  This test is not yet approved or cleared by the [place] FDA and has been authorized for detection and/or  diagnosis of SARS-CoV-2 by FDA under an Emergency Use Authorization (EUA). This EUA will remain in effect (meaning this test can be used) for the duration of the COVID-19 declaration under Section 564(b)(1) of the Act, 21 U.S.C. section 360bbb-3(b)(1), unless the authorization is terminated or revoked.  Performed at [facility], [address], [place], [place] [address]     Lab Basic Metabolic Panel: Recent Labs  Lab 01/10/2021 1628 12/17/2020 2214 02-03-21 0501  NA 122* 121* 123*  K 5.1 5.8* 5.8*  CL 91* 90* 90*  CO2 18* 17* 19*  GLUCOSE 115* 113* 93  BUN 56* 59* 59*  CREATININE 2.23* 2.36* 2.47*  CALCIUM 8.5* 8.4* 8.7*  MG 2.3  --   --    Liver Function Tests: Recent Labs  Lab 12/27/2020 1628 Feb 03, 2021 0501  AST 175* 688*  ALT 211* 644*  ALKPHOS 215* 231*  BILITOT 1.9* 2.9*  PROT 6.6 6.2*  ALBUMIN 3.4* 3.3*   Recent Labs  Lab 12/25/2020 1628  LIPASE 24   No results for input(s): AMMONIA in the last 168 hours. CBC: Recent Labs  Lab 01/11/2021 1628  WBC 9.7  HGB 8.7*  HCT 27.8*  MCV 82.2  PLT 302   Cardiac Enzymes: No results for input(s): CKTOTAL, CKMB, CKMBINDEX, TROPONINI in the last 168 hours. Sepsis Labs: Recent Labs  Lab 01/11/2021 1628  WBC 9.7    Procedures/Operations     Lonnel Oluwadarasimi Redmon 02-03-2021, 6:38 PM

## 2021-01-13 NOTE — Progress Notes (Addendum)
PROGRESS NOTE  [name] [name]. AVW:[id number] DOB: 08/23/1929 DOA: 01/11/2021 PCP: Cassandria Anger, MD  Brief History:  87 year old male with a history of coronary artery disease with history of CABG, systolic and diastolic CHF status post AICD, hypertension, hyperlipidemia, OSA, CKD stage III presenting with 3-week history of shortness of breath, dyspnea on exertion, and orthopnea type symptoms.  The patient states that he has had increasing lower extremity edema and increasing abdominal girth.  He endorses compliance with his furosemide.  He states that he drinks at least 2 x 20 ounce bottles of water on a daily basis and according other fluid intake.  He states that his last weight at home was 159 pounds on 01/04/2021.  He contacted his cardiologist who recommended the patient come to the hospital for further evaluation.  He states that his furosemide dose has not been changed recently.  He has been on Lasix 60 mg a.m., 40 mg p.m. reviewed the medical record shows his 40 mg p.m. dose was added on 12/07/2020 when he saw cardiology in the office.  His office weight at that time was 167 pounds.  He denies any fevers, chills, headache, neck pain, hemoptysis. Notably, the patient has had some intermittent nausea and vomiting.  He denies any frank abdominal pain.  He states that he has had problems with his gallbladder in the past, but could not undergo operation secondary to his "bad heart". He denies any diarrhea, hematochezia, melena.  In the emergency department, the patient was afebrile hemodynamically stable with oxygen saturation 89% on room air.  BMP showed a sodium 121, potassium 5.8, serum creatinine 2.23.  AST 175, ALT 211, alk phosphatase 215, total bilirubin 1.9.  WBC 9.7, hemoglobin 8.7, platelets 202,000.  Troponin 38>>30.  Chest x-ray showed increased interstitial markings and small bilateral effusions, right greater than left.  Patient was placed on 2 L nasal cannula with  saturation up to 100%  Assessment/Plan: Acute respiratory failure with hypoxia -Secondary to pulmonary edema -Currently stable on 2 L nasal cannula -Wean oxygen for saturation greater 90% -Personally reviewed chest x-ray -Increased interstitial markings  Acute on chronic combined CHF -10/26/2020 echo EF 30-35%, grade 3 DD, +WMA; moderate MR, mild to moderate TR -Continue IV furosemide 80 mg IV twice daily -Holding carvedilol secondary to soft blood pressures and acute decompensation -Cardiology consult  Hyperkalemia -Holding potassium supplementation -anticipate improvement with lasix IV -remain on telemetry  Acute on chronic renal failure--CKD stage IIIb -Baseline creatinine 1.8-2.0 -Presented with serum creatinine 2.36 -We will need to tolerate increased serum creatinine for improved euvolemia -Monitor BMP  Transaminasemia -In part due to hepatic congestion from decompensated CHF and hemodynamic changes (soft/low BPs) -Concerned about possible cholecystitis with history of cholelithiasis -11/02/2020 RUQ US--GB contracted around 36m stone in the neck without pericholecystic fluid or Murphy sign.  Common bile duct 7 mm -Repeat right upper quadrant ultrasound  Hyponatremia -Secondary to fluid overload -Anticipate improvement with diuresis  Hyperlipidemia -Holding Lipitor secondary to elevated LFTs  Coronary artery disease -No chest pain presently -Continue Ranexa -Personally reviewed EKG--paced rhythm -continue plavix -restart imdur if BP can tolerate  OSA -continue CPAP hs        Status is: Inpatient  Remains inpatient appropriate because:IV treatments appropriate due to intensity of illness or inability to take PO   Dispo: The patient is from: Home              Anticipated d/c is  to: Home              Patient currently is not medically stable to d/c.   Difficult to place patient No        Family Communication: no  Family at  bedside  Consultants:  Cardiology/general surgery  Code Status:  FULL   DVT Prophylaxis:  [place] Lovenox   Procedures: As Listed in Progress Note Above  Antibiotics: None    Subjective: Patient states that his breathing is little better.  He denies any chest pain, fevers, chills, chest pain, nausea, vomiting, diarrhea, abdominal pain presently.  Objective: Vitals:   12/22/2020 2200 23-Jan-2021 0031 01-23-2021 0213 01-23-21 0535  BP:  109/62 109/64 96/77  Pulse: 77 72 70 75  Resp: 20  18 20   Temp:   97.8 F (36.6 C) 98.5 F (36.9 C)  TempSrc:   Oral   SpO2: 99%  100% 95%  Weight:      Height:        Intake/Output Summary (Last 24 hours) at 01-23-2021 0836 Last data filed at 01-23-2021 0700 Gross per 24 hour  Intake --  Output 1000 ml  Net -1000 ml   Weight change:  Exam:   General:  Pt is alert, follows commands appropriately, not in acute distress  HEENT: No icterus, No thrush, No neck mass, [place]/AT  Cardiovascular: RRR, S1/S2, no rubs, no gallops  Respiratory: Bibasilar rales but no wheezing  Abdomen: Soft/+BS, epigastric/RUQ tender, non distended, no guarding  Extremities: 2+LE edema, No lymphangitis, No petechiae, No rashes, no synovitis   Data Reviewed: I have personally reviewed following labs and imaging studies Basic Metabolic Panel: Recent Labs  Lab 01/12/2021 1628 12/16/2020 2214 01-23-21 0501  NA 122* 121* 123*  K 5.1 5.8* 5.8*  CL 91* 90* 90*  CO2 18* 17* 19*  GLUCOSE 115* 113* 93  BUN 56* 59* 59*  CREATININE 2.23* 2.36* 2.47*  CALCIUM 8.5* 8.4* 8.7*  MG 2.3  --   --    Liver Function Tests: Recent Labs  Lab 01/11/2021 1628 2021-01-23 0501  AST 175* 688*  ALT 211* 644*  ALKPHOS 215* 231*  BILITOT 1.9* 2.9*  PROT 6.6 6.2*  ALBUMIN 3.4* 3.3*   Recent Labs  Lab 01/11/2021 1628  LIPASE 24   No results for input(s): AMMONIA in the last 168 hours. Coagulation Profile: No results for input(s): INR, PROTIME in the last 168 hours. CBC: Recent  Labs  Lab 12/27/2020 1628  WBC 9.7  HGB 8.7*  HCT 27.8*  MCV 82.2  PLT 302   Cardiac Enzymes: No results for input(s): CKTOTAL, CKMB, CKMBINDEX, TROPONINI in the last 168 hours. BNP: Invalid input(s): POCBNP CBG: No results for input(s): GLUCAP in the last 168 hours. HbA1C: No results for input(s): HGBA1C in the last 72 hours. Urine analysis:    Component Value Date/Time   COLORURINE YELLOW 10/31/2020 1346   APPEARANCEUR CLEAR 10/31/2020 1346   LABSPEC <=1.005 (A) 10/31/2020 1346   PHURINE 5.5 10/31/2020 1346   GLUCOSEU NEGATIVE 10/31/2020 1346   HGBUR NEGATIVE 10/31/2020 1346   BILIRUBINUR NEGATIVE 10/31/2020 1346   KETONESUR NEGATIVE 10/31/2020 1346   UROBILINOGEN 0.2 10/31/2020 1346   NITRITE NEGATIVE 10/31/2020 1346   LEUKOCYTESUR NEGATIVE 10/31/2020 1346   Sepsis Labs: @LABRCNTIP (procalcitonin:4,lacticidven:4) ) Recent Results (from the past 240 hour(s))  Resp Panel by RT-PCR (Flu A&B, Covid) Nasopharyngeal Swab     Status: None   Collection Time: 12/28/2020  3:01 PM   Specimen: Nasopharyngeal Swab; Nasopharyngeal(NP) swabs  in vial transport medium  Result Value Ref Range Status   SARS Coronavirus 2 by RT PCR NEGATIVE NEGATIVE Final    Comment: (NOTE) SARS-CoV-2 target nucleic acids are NOT DETECTED.  The SARS-CoV-2 RNA is generally detectable in upper respiratory specimens during the acute phase of infection. The lowest concentration of SARS-CoV-2 viral copies this assay can detect is 138 copies/mL. A negative result does not preclude SARS-Cov-2 infection and should not be used as the sole basis for treatment or other patient management decisions. A negative result may occur with  improper specimen collection/handling, submission of specimen other than nasopharyngeal swab, presence of viral mutation(s) within the areas targeted by this assay, and inadequate number of viral copies(<138 copies/mL). A negative result must be combined with clinical observations,  patient history, and epidemiological information. The expected result is Negative.  Fact Sheet for Patients:  EntrepreneurPulse.com.au  Fact Sheet for Healthcare Providers:  IncredibleEmployment.be  This test is no t yet approved or cleared by the [place] FDA and  has been authorized for detection and/or diagnosis of SARS-CoV-2 by FDA under an Emergency Use Authorization (EUA). This EUA will remain  in effect (meaning this test can be used) for the duration of the COVID-19 declaration under Section 564(b)(1) of the Act, 21 U.S.C.section 360bbb-3(b)(1), unless the authorization is terminated  or revoked sooner.       Influenza A by PCR NEGATIVE NEGATIVE Final   Influenza B by PCR NEGATIVE NEGATIVE Final    Comment: (NOTE) The Xpert Xpress SARS-CoV-2/FLU/RSV plus assay is intended as an aid in the diagnosis of influenza from Nasopharyngeal swab specimens and should not be used as a sole basis for treatment. Nasal washings and aspirates are unacceptable for Xpert Xpress SARS-CoV-2/FLU/RSV testing.  Fact Sheet for Patients: EntrepreneurPulse.com.au  Fact Sheet for Healthcare Providers: IncredibleEmployment.be  This test is not yet approved or cleared by the [place] FDA and has been authorized for detection and/or diagnosis of SARS-CoV-2 by FDA under an Emergency Use Authorization (EUA). This EUA will remain in effect (meaning this test can be used) for the duration of the COVID-19 declaration under Section 564(b)(1) of the Act, 21 U.S.C. section 360bbb-3(b)(1), unless the authorization is terminated or revoked.  Performed at [facility], [address] [place], [place] [address]      Scheduled Meds: . atorvastatin  40 mg Oral Daily  . clopidogrel  75 mg Oral Daily  . enoxaparin (LOVENOX) injection  30 mg Subcutaneous Q24H  . furosemide  80 mg Intravenous Q12H  . ranolazine  1,000 mg Oral  BID  . sodium chloride flush  3 mL Intravenous Q12H   Continuous Infusions: . sodium chloride      Procedures/Studies: DG Chest Portable 1 View  Result Date: 12/29/2020 CLINICAL DATA:  Patient complains of fluid overload for 3 weeks. EXAM: PORTABLE CHEST 1 VIEW COMPARISON:  December 29, 2017 FINDINGS: Stable cardiomegaly and AICD device. Sternotomy wires are intact. No pneumothorax. No overt edema. There appear to be small pleural effusions, right greater than left with underlying atelectasis. No other acute abnormalities. IMPRESSION: 1. Cardiomegaly without overt edema. 2. Right greater than left small pleural effusions. Electronically Signed   By: Dorise Bullion III M.D   On: 12/25/2020 15:29    Orson Eva, DO  [facility]  If 7PM-7AM, please contact night-coverage [email or web address] Password [email or web address] Jan 21, 2021, 8:36 AM   LOS: 1 day

## 2021-01-13 NOTE — Progress Notes (Signed)
Responded to nursing call:  CODE BLUE   Subjective: Unresponsive. Patient was getting back into bed from sitting up when he became apneic and unresponsive.  No pulse was palpated.  ACLS protocol was initiated.  Dr. Laverta Baltimore present to assist in providing airway and intubated patient.  Cardiologist also present to assist--had consulted on patient earlier in the day and stated patient was not a candidate for advanced CHF therapy. After about 15-20 minutes of ACLS protocol, spouse requested to speak with me.  I spoke with the spouse whom stated she did want him to suffer.  I updated her on the situation and told her that the patient's prognosis was very poor even if he would to have ROSC.  I discussed that we as a medical team were "doing everything" and I discussed that cardiology felt his prognosis to be poor even before the code blue and that he was not a candidate for advanced cardiac therapies..  She stated that he would have not wanted to be in a state with no quality of life and she did not want him to suffer.  After further discussion, she stated that she wished for the ACLS protocol to be terminated and for the focus to be soley comfort.  Vitals:   2021/01/31 0031 01/31/21 0213 2021-01-31 0535 01-31-21 1000  BP: 109/62 109/64 96/77 109/69  Pulse: 72 70 75 62  Resp:  18 20 (!) 22  Temp:  97.8 F (36.6 C) 98.5 F (36.9 C) 98.8 F (37.1 C)  TempSrc:  Oral  Oral  SpO2:  100% 95% 98%  Weight:      Height:                Orson Eva, DO [facility]

## 2021-01-13 NOTE — Progress Notes (Signed)
Chaplain responded to Code Blue and patient passed. Chaplain assisted patient spouse, Tawanna Sat, today after her husband passed. She engaged well in life review around their relationship and his spiritual life. She had appropriate tie with him alone after his passing and asked to not have anyone with her at the hospital. She left with the patient placement number in order to give hospital funeral home information when she has it. Chaplain provided cl;osing prayer per her request and spiritual presence/support. Will remain available as needed in order to provide support and to assess for spiritual need.

## 2021-01-13 NOTE — ED Provider Notes (Signed)
Department of Emergency Medicine   Code Blue CONSULT NOTE  Chief Complaint: Cardiac arrest/unresponsive   Level V Caveat: Unresponsive  History of present illness: I was contacted by the hospital for a CODE BLUE cardiac arrest upstairs and presented to the patient's bedside.  Brief review at the bedside shows the patient was admitted for CHF and had been on BiPAP. When I arrive Dr. Carles Collet is directing CPR/ACLS and I was asked to assist with an airway.   ROS: Unable to obtain, Level V caveat  Scheduled Meds: . clopidogrel  75 mg Oral Daily  . enoxaparin (LOVENOX) injection  30 mg Subcutaneous Q24H  . furosemide  80 mg Intravenous Q12H  . ranolazine  1,000 mg Oral BID  . sodium chloride flush  3 mL Intravenous Q12H   Continuous Infusions: . sodium chloride     PRN Meds:.sodium chloride, acetaminophen, acetaminophen-codeine, glycopyrrolate, ondansetron (ZOFRAN) IV, sodium chloride flush, traZODone Past Medical History:  Diagnosis Date  . AICD (automatic cardioverter/defibrillator) present   . Arrhythmia   . Arthritis    "hands, legs" (03/26/2016)  . 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 Chronic systolic CHF (congestive heart failure) ([place])    a. Echo 9/14: Mild LVH, mild focal basal septal hypertrophy, EF 35-40%, anteroseptal HK, normal diastolic function, mild MR, mild LAE, PASP 34 mmHg  . Chronotropic incompetence    treated w/pacemaker, ICD 7.2008. This was placed after CPX done post 2008 cath. CPX showed only chronotropic incompetence  . History of blood transfusion 1969   "after getting MSO4; it destroys my corpuscles" (03/26/2016)  . History of colon polyps   . Hyperlipidemia   . Hypertension    . Migraine    "just before my bypass" (03/26/2016)  . Mitral regurgitation    mild  . Myocardial infarction ([place]) "several"  . Numbness and tingling of left arm and leg    improved after tx w/prednisone  . OSA (obstructive sleep apnea) 09/01/2016   Severe OSA with AHI 38/hr now on BiPAP at 21/17cm H2O  . Syncopal episodes    relative hypotension   Past Surgical History:  Procedure Laterality Date  . BACK SURGERY    . CARDIAC CATHETERIZATION  1980s-1990s X 4  . CARDIAC CATHETERIZATION N/A 03/26/2016   Procedure: Left Heart Cath and Cors/Grafts Angiography;  Surgeon: Burnell Blanks, MD;  Location: [place] [facility];  Service: Cardiovascular;  Laterality: N/A;  . CARDIAC CATHETERIZATION N/A 03/26/2016   Procedure: Coronary Stent Intervention;  Surgeon: Burnell Blanks, MD;  Location: [place] [facility];  Service: Cardiovascular;  Laterality: N/A;  . CARDIAC DEFIBRILLATOR PLACEMENT  2008  . CATARACT EXTRACTION W/ INTRAOCULAR LENS  IMPLANT, BILATERAL Bilateral 08/2013 - 09/2013   right - left   . CORONARY ANGIOPLASTY WITH STENT PLACEMENT  ~ 1992; 03/26/2016   "1 + 2"  . CORONARY ARTERY BYPASS GRAFT  1992   CABG "X4"  . CYSTOSCOPY  1980s  . GAS/FLUID EXCHANGE Right 06/18/2017   Procedure: GAS/FLUID EXCHANGE (C3F8);  Surgeon: Sherlynn Stalls, MD;  Location: [place];  Service: Ophthalmology;  Laterality: Right;  . IMPLANTABLE CARDIOVERTER DEFIBRILLATOR (ICD) GENERATOR CHANGE N/A 06/21/2014   Procedure: ICD  GENERATOR CHANGE;  Surgeon: Deboraha Sprang, MD;  Location: [facility];  Service: Cardiovascular;  Laterality: N/A;  . PARS PLANA VITRECTOMY Right 06/18/2017   Procedure: PARS PLANA VITRECTOMY WITH 25 GAUGE;  Surgeon: Sherlynn Stalls, MD;  Location: [place];  Service: Ophthalmology;  Laterality: Right;  . PHOTOCOAGULATION WITH LASER Right 06/18/2017   Procedure: PHOTOCOAGULATION WITH LASER- ENDO LASER;  Surgeon: Sherlynn Stalls, MD;  Location: [place];  Service: Ophthalmology;   Laterality: Right;  . POSTERIOR LUMBAR FUSION  ~ 1989   "fused 2 discs"  . TIBIA FRACTURE SURGERY Left 1943   "put artificial bone in there"   Social History   Socioeconomic History  . Marital status: Married    Spouse name: 1 child passed away  . Number of children: 4  . Years of education: Not on file  . Highest education level: Not on file  Occupational History  . Occupation: Retired  Tobacco Use  . Smoking status: Former Smoker    Years: 29.00    Types: Cigars  . Smokeless tobacco: Never Used  . Tobacco comment: quit smoking cigars in 1973  Vaping Use  . Vaping Use: Never used  Substance and Sexual Activity  . Alcohol use: Not Currently    Comment: "quit drinking in 1973"  . Drug use: No  . Sexual activity: Yes  Other Topics Concern  . Not on file  Social History Narrative   Widowed - 06/2010   Social Determinants of Health   Financial Resource Strain: Low Risk   . Difficulty of Paying Living Expenses: Not hard at all  Food Insecurity: No Food Insecurity  . Worried About Charity fundraiser in the Last Year: Never true  . Ran Out of Food in the Last Year: Never true  Transportation Needs: No Transportation Needs  . Lack of Transportation (Medical): No  . Lack of Transportation (Non-Medical): No  Physical Activity: Inactive  . Days of Exercise per Week: 0 days  . Minutes of Exercise per Session: 0 min  Stress: No Stress Concern Present  . Feeling of Stress : Not at all  Social Connections: Socially Integrated  . Frequency of Communication with Friends and Family: More than three times a week  . Frequency of Social Gatherings with Friends and Family: More than three times a week  . Attends Religious Services: More than 4 times per year  . Active Member of Clubs or Organizations: No  . Attends Archivist Meetings: More than 4 times per year  . Marital Status: Married  Human resources officer Violence: Not on file   Allergies  Allergen Reactions  .  Morphine     "increased white corpuscles" Can take codeine  . Ramipril Other (See Comments)    REACTION: cough if dose is over 2.5mg     Last set of Vital Signs (not current) Vitals:   2021-02-01 0535 02/01/2021 1000  BP: 96/77 109/69  Pulse: 75 62  Resp: 20 (!) 22  Temp: 98.5 F (36.9 C) 98.8 F (37.1 C)  SpO2: 95% 98%      Physical Exam  Gen: unresponsive Cardiovascular: pulseless. Good quality CPR in progress.  Resp: apneic. Breath sounds equal bilaterally with bagging  Abd: Mild distension.  Neuro: GCS 3, unresponsive to pain  HEENT: No blood in posterior pharynx, gag reflex absent. Dentures removed, upper and lower.  Neck: No crepitus  Musculoskeletal: No deformity  Skin: warm  Procedures  INTUBATION Performed by: Margette Fast Required items: required blood  products, implants, devices, and special equipment available. Emergent intubation.  Indications: Respiratory failure (acute).  Intubation method: Video laryngoscopy Preoxygenation: BVM Sedatives: None Paralytic: None Tube Size: 7.5 cuffed Post-procedure assessment: chest rise and equal breath sounds.  Breath sounds: equal and absent over the epigastrium Tube secured by Respiratory Therapy w/ tape. Patient tolerated the procedure well with no immediate complications.   Medical Decision making  I arrived to find the patient unresponsive after CODE BLUE was activated.  The hospitalist team is directing CPR and ACLS on my arrival.  Patient is having some weak, spontaneous respirations.  Asystole on monitor.  I prepared for intubation with CPR ongoing.  Ultimately, I was able to secure an airway without interrupting CPR.  The tube was secured by respiratory therapy and ACLS continued by hospitalist team.   Assessment and Plan  1. Airway secured to facilitate resuscitation.  2. Once ROSC is achieved, will need OG tube and post-intubation CXR.  3. No further assistance requested by hospitalist team.   Nanda Quinton,  MD Emergency Medicine    Gianne Shugars, [facility], MD Jan 24, 2021 1250

## 2021-01-13 NOTE — Progress Notes (Signed)
Called into patient's room by staff and family member for patient going unresponsive. This nurse checked for a pulse and none was palpated, code blue was activated and CPR was started approximately 1230. Code team at bedside.

## 2021-01-13 NOTE — Progress Notes (Addendum)
Patient requires frequent re-positioning of the body in ways that cannot be achieved with an ordinary bed or wedge pillow, to eliminate pain, reduce pressure, and the head of the bed to be elevated more than 30 degrees most of the time due to congestive heart failure.  ?

## 2021-01-13 DEATH — deceased

## 2021-01-15 ENCOUNTER — Ambulatory Visit: Payer: Medicare Other | Admitting: Physician Assistant

## 2021-01-15 ENCOUNTER — Other Ambulatory Visit: Payer: Medicare Other

## 2021-01-23 ENCOUNTER — Ambulatory Visit: Payer: Medicare Other | Admitting: Internal Medicine
